# Patient Record
Sex: Male | Born: 1965
Health system: Southern US, Community
[De-identification: ages and names within clinical notes are randomized; demographics above are authoritative.]

## PROBLEM LIST (undated history)

## (undated) DIAGNOSIS — I10 Essential (primary) hypertension: Secondary | ICD-10-CM

## (undated) DIAGNOSIS — B019 Varicella without complication: Secondary | ICD-10-CM

## (undated) DIAGNOSIS — R32 Unspecified urinary incontinence: Secondary | ICD-10-CM

## (undated) DIAGNOSIS — J449 Chronic obstructive pulmonary disease, unspecified: Secondary | ICD-10-CM

## (undated) DIAGNOSIS — F32A Depression, unspecified: Secondary | ICD-10-CM

## (undated) DIAGNOSIS — T7840XA Allergy, unspecified, initial encounter: Secondary | ICD-10-CM

## (undated) DIAGNOSIS — F329 Major depressive disorder, single episode, unspecified: Secondary | ICD-10-CM

## (undated) DIAGNOSIS — R9431 Abnormal electrocardiogram [ECG] [EKG]: Secondary | ICD-10-CM

## (undated) DIAGNOSIS — F419 Anxiety disorder, unspecified: Secondary | ICD-10-CM

## (undated) DIAGNOSIS — U071 COVID-19: Secondary | ICD-10-CM

## (undated) DIAGNOSIS — E785 Hyperlipidemia, unspecified: Secondary | ICD-10-CM

## (undated) DIAGNOSIS — K219 Gastro-esophageal reflux disease without esophagitis: Secondary | ICD-10-CM

## (undated) HISTORY — DX: Allergy, unspecified, initial encounter: T78.40XA

## (undated) HISTORY — DX: Abnormal electrocardiogram (ECG) (EKG): R94.31

## (undated) HISTORY — DX: Gastro-esophageal reflux disease without esophagitis: K21.9

## (undated) HISTORY — DX: Varicella without complication: B01.9

## (undated) HISTORY — DX: Depression, unspecified: F32.A

## (undated) HISTORY — DX: Major depressive disorder, single episode, unspecified: F32.9

## (undated) HISTORY — DX: Hyperlipidemia, unspecified: E78.5

## (undated) HISTORY — DX: Essential (primary) hypertension: I10

## (undated) HISTORY — DX: Unspecified urinary incontinence: R32

## (undated) HISTORY — DX: Anxiety disorder, unspecified: F41.9

---

## 1998-04-18 HISTORY — PX: CHOLECYSTECTOMY: SHX55

## 2006-03-25 ENCOUNTER — Emergency Department: Payer: Self-pay | Admitting: Emergency Medicine

## 2009-10-03 ENCOUNTER — Inpatient Hospital Stay: Payer: Self-pay | Admitting: Internal Medicine

## 2009-10-22 ENCOUNTER — Inpatient Hospital Stay: Payer: Self-pay | Admitting: Specialist

## 2015-02-05 ENCOUNTER — Ambulatory Visit (INDEPENDENT_AMBULATORY_CARE_PROVIDER_SITE_OTHER): Payer: BLUE CROSS/BLUE SHIELD | Admitting: Nurse Practitioner

## 2015-02-05 ENCOUNTER — Encounter: Payer: Self-pay | Admitting: Nurse Practitioner

## 2015-02-05 VITALS — BP 122/82 | HR 100 | Temp 98.5°F | Resp 18 | Ht 69.0 in | Wt 205.6 lb

## 2015-02-05 DIAGNOSIS — Z7689 Persons encountering health services in other specified circumstances: Secondary | ICD-10-CM

## 2015-02-05 DIAGNOSIS — Z23 Encounter for immunization: Secondary | ICD-10-CM

## 2015-02-05 DIAGNOSIS — K219 Gastro-esophageal reflux disease without esophagitis: Secondary | ICD-10-CM

## 2015-02-05 DIAGNOSIS — G629 Polyneuropathy, unspecified: Secondary | ICD-10-CM | POA: Insufficient documentation

## 2015-02-05 DIAGNOSIS — Z7189 Other specified counseling: Secondary | ICD-10-CM | POA: Diagnosis not present

## 2015-02-05 DIAGNOSIS — Z9109 Other allergy status, other than to drugs and biological substances: Secondary | ICD-10-CM

## 2015-02-05 DIAGNOSIS — R34 Anuria and oliguria: Secondary | ICD-10-CM | POA: Insufficient documentation

## 2015-02-05 DIAGNOSIS — Z91048 Other nonmedicinal substance allergy status: Secondary | ICD-10-CM

## 2015-02-05 MED ORDER — GABAPENTIN 300 MG PO CAPS: 300.0000 mg | ORAL_CAPSULE | Freq: Every day | ORAL | Status: DC

## 2015-02-05 MED ORDER — TETANUS-DIPHTH-ACELL PERTUSSIS 5-2.5-18.5 LF-MCG/0.5 IM SUSP: 0.5000 mL | Freq: Once | INTRAMUSCULAR | Status: DC

## 2015-02-05 NOTE — Assessment & Plan Note (Signed)
Patient reports a decrease in urination but would like to talk about this at another time

## 2015-02-05 NOTE — Assessment & Plan Note (Signed)
Discussed acute and chronic issues. Reviewed health maintenance measures, PFSHx, and immunizations. Obtain records from previous facility.   

## 2015-02-05 NOTE — Progress Notes (Signed)
Pre visit review using our clinic review tool, if applicable. No additional management support is needed unless otherwise documented below in the visit note. 

## 2015-02-05 NOTE — Assessment & Plan Note (Signed)
Stable with OTC medications for seasonal allergies

## 2015-02-05 NOTE — Patient Instructions (Signed)
Welcome to [facility]! Nice to meet you.   Take the 1 capsule at night. Follow up in 4 weeks.

## 2015-02-05 NOTE — Assessment & Plan Note (Signed)
Neuropathy of bilateral heels after major back pain with sciatica bilaterally. The sciatica resolved after steroids. The heel pain feels like a toothache to the patient. He still has some sensation but it is decreased. After discussion of treatment options we decided to do gabapentin 300 mg once at nighttime and will follow up in 4 weeks and see if we need to up it.

## 2015-02-05 NOTE — Progress Notes (Signed)
Patient ID: Darrell Santiago, male    DOB: 1965/04/29  Age: 49 y.o. MRN: [id number]  CC: No chief complaint on file.   HPI Darrell Santiago presents for establishing care and CC of feet pain.   1) New pt info:   Immunizations- Unknown tdap status, okay with flu today  Eye Exam- Not UTD  Dental Exam- Not UTD, no flossing  2) Chronic Problems-   GERD- Rolaids, eats spicy foods   Allergies- Environmental, seasonal  Trouble urinating- Feels he needs to urinate badly then goes and it does not seem "worth it" to urinate, no stream concerns he reports, just shorter duration than in the past.   Obese- Diet- Not healthy he reports   Exercise- 12 miles a day at his job   3) Acute Problems-   Foot pain- bilateral heels, feels like a tooth ache Steroids twice helpful at the time  History of back pain radiating went to hospital twice    Flu vaccine and Tdap today   History Darrell Santiago has a past medical history of Chicken pox; GERD (gastroesophageal reflux disease); Allergy; and Urine incontinence.   He has past surgical history that includes Cholecystectomy (2000).   His family history includes Alcohol abuse in his father; Cancer in his maternal grandfather; Diabetes in his father.He reports that he has been smoking Cigarettes.  He has been smoking about 1.00 pack per day. He has never used smokeless tobacco. He reports that he drinks about 12.6 oz of alcohol per week. He reports that he does not use illicit drugs.  No outpatient prescriptions prior to visit.   No facility-administered medications prior to visit.    ROS Review of Systems  Constitutional: Negative for fever, chills, diaphoresis and fatigue.  HENT: Negative for tinnitus and trouble swallowing.   Eyes: Negative for visual disturbance.  Respiratory: Negative for chest tightness, shortness of breath and wheezing.   Cardiovascular: Negative for chest pain, palpitations and leg swelling.  Gastrointestinal: Negative for nausea,  vomiting and diarrhea.  Endocrine: Negative for polydipsia, polyphagia and polyuria.  Genitourinary: Positive for difficulty urinating.  Skin: Negative for rash.  Neurological: Positive for numbness. Negative for dizziness and weakness.  Psychiatric/Behavioral: The patient is not nervous/anxious.     Objective:  BP 122/82 mmHg  Pulse 100  Temp(Src) 98.5 F (36.9 C)  Resp 18  Ht 5\' 9"  (1.753 m)  Wt 205 lb 9.6 oz (93.26 kg)  BMI 30.35 kg/m2  SpO2 95%  Physical Exam  Constitutional: He is oriented to person, place, and time. He appears well-developed and well-nourished. No distress.  HENT:  Head: Normocephalic and atraumatic.  Right Ear: External ear normal.  Left Ear: External ear normal.  Cardiovascular: Normal rate, regular rhythm, normal heart sounds and intact distal pulses.  Exam reveals no gallop and no friction rub.   No murmur heard. Pulmonary/Chest: Effort normal and breath sounds normal. No respiratory distress. He has no wheezes. He has no rales. He exhibits no tenderness.  Neurological: He is alert and oriented to person, place, and time.  Monofilament exam was normal except for decreased sensation of bilateral heels.  Skin: Skin is warm and dry. No rash noted. He is not diaphoretic.  Psychiatric: He has a normal mood and affect. His behavior is normal. Judgment and thought content normal.   Assessment & Plan:   Diagnoses and all orders for this visit:  Encounter for immunization  Need for prophylactic vaccination with combined diphtheria-tetanus-pertussis (DTP) vaccine -  Discontinue: Tdap (BOOSTRIX) injection 0.5 mL; Inject 0.5 mLs into the muscle once. -     Tdap vaccine greater than or equal to 7yo IM  Encounter to establish care  Neuropathy ([place])  Gastroesophageal reflux disease, esophagitis presence not specified  Environmental allergies  Urination decrease  Other orders -     gabapentin (NEURONTIN) 300 MG capsule; Take 1 capsule (300 mg total)  by mouth at bedtime. -     Flu Vaccine QUAD 36+ mos IM   I am having Mr. Baller start on gabapentin.  Meds ordered this encounter  Medications  . gabapentin (NEURONTIN) 300 MG capsule    Sig: Take 1 capsule (300 mg total) by mouth at bedtime.    Dispense:  30 capsule    Refill:  1    Order Specific Question:  Supervising Provider    Answer:  Deborra Medina L [[id number]]  . DISCONTD: Tdap (BOOSTRIX) injection 0.5 mL    Sig:      Follow-up: Return in about 4 weeks (around 03/05/2015) for Neuropathy follow up.

## 2015-02-05 NOTE — Assessment & Plan Note (Signed)
Stable currently with Rolaids. Patient reports he does have a poor diet of fatty, spicy foods, and caffeinated beverages.

## 2015-03-03 ENCOUNTER — Ambulatory Visit (INDEPENDENT_AMBULATORY_CARE_PROVIDER_SITE_OTHER): Payer: BLUE CROSS/BLUE SHIELD | Admitting: Nurse Practitioner

## 2015-03-03 VITALS — BP 142/90 | HR 84 | Temp 98.3°F | Resp 18 | Ht 69.0 in | Wt 202.6 lb

## 2015-03-03 DIAGNOSIS — G629 Polyneuropathy, unspecified: Secondary | ICD-10-CM | POA: Diagnosis not present

## 2015-03-03 MED ORDER — GABAPENTIN 300 MG PO CAPS: 300.0000 mg | ORAL_CAPSULE | Freq: Three times a day (TID) | ORAL | Status: DC

## 2015-03-03 NOTE — Progress Notes (Signed)
Patient ID: Darrell Santiago, male    DOB: 08/16/1965  Age: 49 y.o. MRN: [id number]  CC: Follow-up   HPI Darrell Santiago presents for follow up of Neuropathy.   1) Patient is not improving and taking Gabapentin only 300 mg at night.  Drinking some recently on vacation and reports improvement when drinking alcohol. Pt is not ready to obtain imagining such as Lumbar X-ray and MRI for further evaluation. Pt is not physically active nor is eating a healthy diet.   History Darrell Santiago has a past medical history of Chicken pox; GERD (gastroesophageal reflux disease); Allergy; and Urine incontinence.   He has past surgical history that includes Cholecystectomy (2000).   His family history includes Alcohol abuse in his father; Cancer in his maternal grandfather; Diabetes in his father.He reports that he has been smoking Cigarettes.  He has been smoking about 1.00 pack per day. He has never used smokeless tobacco. He reports that he drinks about 12.6 oz of alcohol per week. He reports that he does not use illicit drugs.  Outpatient Prescriptions Prior to Visit  Medication Sig Dispense Refill  . gabapentin (NEURONTIN) 300 MG capsule Take 1 capsule (300 mg total) by mouth at bedtime. 30 capsule 1   No facility-administered medications prior to visit.    ROS Review of Systems  Constitutional: Negative for fever, chills, diaphoresis and fatigue.  Eyes: Negative for visual disturbance.  Respiratory: Negative for chest tightness, shortness of breath and wheezing.   Cardiovascular: Negative for chest pain, palpitations and leg swelling.  Gastrointestinal: Negative for nausea, vomiting and diarrhea.  Endocrine: Negative for polydipsia, polyphagia and polyuria.  Skin: Negative for rash.  Neurological: Negative for dizziness, weakness and numbness.  Psychiatric/Behavioral: The patient is not nervous/anxious.     Objective:  BP 142/90 mmHg  Pulse 84  Temp(Src) 98.3 F (36.8 C)  Resp 18  Ht 5\' 9"  (1.753  m)  Wt 202 lb 9.6 oz (91.899 kg)  BMI 29.91 kg/m2  SpO2 97%  Physical Exam  Constitutional: He is oriented to person, place, and time. He appears well-developed and well-nourished. No distress.  HENT:  Head: Normocephalic and atraumatic.  Right Ear: External ear normal.  Left Ear: External ear normal.  Cardiovascular: Normal rate, regular rhythm and normal heart sounds.  Exam reveals no gallop and no friction rub.   No murmur heard. Pulmonary/Chest: Effort normal and breath sounds normal. No respiratory distress. He has no wheezes. He has no rales. He exhibits no tenderness.  Musculoskeletal: Normal range of motion. He exhibits no edema or tenderness.  Neurological: He is alert and oriented to person, place, and time. He displays normal reflexes. He exhibits normal muscle tone. Coordination normal.  Negative straight leg lift bilaterally  Skin: Skin is warm and dry. No rash noted. He is not diaphoretic.  Psychiatric: He has a normal mood and affect. His behavior is normal. Judgment and thought content normal.   Assessment & Plan:   Darrell Santiago was seen today for follow-up.  Diagnoses and all orders for this visit:  Neuropathy ([place])  Other orders -     gabapentin (NEURONTIN) 300 MG capsule; Take 1 capsule (300 mg total) by mouth 3 (three) times daily.   I have discontinued [name]'s gabapentin. I am also having him start on gabapentin.  Meds ordered this encounter  Medications  . gabapentin (NEURONTIN) 300 MG capsule    Sig: Take 1 capsule (300 mg total) by mouth 3 (three) times daily.    Dispense:  90 capsule    Refill:  0    Order Specific Question:  Supervising Provider    Answer:  Crecencio Mc [[id number]]     Follow-up: Return in about 4 weeks (around 03/31/2015) for Neuropathy.

## 2015-03-03 NOTE — Patient Instructions (Signed)
Gabapentin 2 capsules at night for 7 days  Then do 3 capsules at night for the rest of the prescription.   Follow up in 4 weeks.

## 2015-03-03 NOTE — Progress Notes (Signed)
Pre visit review using our clinic review tool, if applicable. No additional management support is needed unless otherwise documented below in the visit note. 

## 2015-03-06 ENCOUNTER — Ambulatory Visit: Payer: BLUE CROSS/BLUE SHIELD | Admitting: Nurse Practitioner

## 2015-03-15 ENCOUNTER — Encounter: Payer: Self-pay | Admitting: Nurse Practitioner

## 2015-03-15 NOTE — Assessment & Plan Note (Addendum)
Uncontrolled. Upped to 2 capsules at night and after 7 days can go up to 3 at night for the remainder of the prescription. Advised pt that we may need to repeat imaging of lumbar spine, he declines this today. He was advised to stop smoking, eat a healthy diet, and low back stretches handout was given to pt. FU in 4 weeks.   Advised to watch alcohol intake. Will obtain lab work for liver function and A1c at next visit.

## 2015-03-16 ENCOUNTER — Encounter: Payer: Self-pay | Admitting: Nurse Practitioner

## 2015-03-20 ENCOUNTER — Ambulatory Visit (INDEPENDENT_AMBULATORY_CARE_PROVIDER_SITE_OTHER): Payer: BLUE CROSS/BLUE SHIELD | Admitting: Nurse Practitioner

## 2015-03-20 ENCOUNTER — Encounter: Payer: Self-pay | Admitting: Nurse Practitioner

## 2015-03-20 DIAGNOSIS — G629 Polyneuropathy, unspecified: Secondary | ICD-10-CM

## 2015-03-20 LAB — CBC WITH DIFFERENTIAL/PLATELET
BASOS ABS: 0 10*3/uL (ref 0.0–0.1)
Basophils Relative: 0.5 % (ref 0.0–3.0)
EOS ABS: 0.1 10*3/uL (ref 0.0–0.7)
Eosinophils Relative: 2.4 % (ref 0.0–5.0)
HEMATOCRIT: 49.8 % (ref 39.0–52.0)
Hemoglobin: 17 g/dL (ref 13.0–17.0)
Lymphocytes Relative: 29.6 % (ref 12.0–46.0)
Lymphs Abs: 1.7 10*3/uL (ref 0.7–4.0)
MCHC: 34.1 g/dL (ref 30.0–36.0)
MCV: 108.4 fl — AB (ref 78.0–100.0)
Monocytes Absolute: 0.6 10*3/uL (ref 0.1–1.0)
Monocytes Relative: 10.5 % (ref 3.0–12.0)
Neutro Abs: 3.3 10*3/uL (ref 1.4–7.7)
Neutrophils Relative %: 57 % (ref 43.0–77.0)
Platelets: 176 10*3/uL (ref 150.0–400.0)
RBC: 4.59 Mil/uL (ref 4.22–5.81)
RDW: 14.3 % (ref 11.5–15.5)
WBC: 5.8 10*3/uL (ref 4.0–10.5)

## 2015-03-20 LAB — HEMOGLOBIN A1C: Hgb A1c MFr Bld: 5.3 % (ref 4.6–6.5)

## 2015-03-20 LAB — COMPREHENSIVE METABOLIC PANEL
ALBUMIN: 4.4 g/dL (ref 3.5–5.2)
ALK PHOS: 82 U/L (ref 39–117)
ALT: 36 U/L (ref 0–53)
AST: 54 U/L — AB (ref 0–37)
BUN: 9 mg/dL (ref 6–23)
CO2: 27 mEq/L (ref 19–32)
CREATININE: 0.73 mg/dL (ref 0.40–1.50)
Calcium: 10.1 mg/dL (ref 8.4–10.5)
Chloride: 103 mEq/L (ref 96–112)
GFR: 122.11 mL/min (ref >60.00)
Glucose, Bld: 53 mg/dL — ABNORMAL LOW (ref 70–99)
Potassium: 3.4 mEq/L — ABNORMAL LOW (ref 3.5–5.1)
SODIUM: 144 meq/L (ref 135–145)
TOTAL PROTEIN: 7.1 g/dL (ref 6.0–8.3)
Total Bilirubin: 0.8 mg/dL (ref 0.2–1.2)

## 2015-03-20 MED ORDER — AMITRIPTYLINE HCL 10 MG PO TABS: 10.0000 mg | ORAL_TABLET | Freq: Every day | ORAL | Status: DC

## 2015-03-20 NOTE — Progress Notes (Signed)
Patient ID: Darrell Santiago, male    DOB: March 30, 1966  Age: 49 y.o. MRN: [id number]  CC: Follow-up   HPI Darrell Santiago presents for follow up of neuropathy.   1) Gabapentin upped to 3 x at night. He felt poorly and went back down to 2 capsules at night with no relief and felt poorly still.  3 capsules made him feel forgetful and heavy sleep, tried it 3-4 nights   Went to hospital and reports they did not do imaging in past, but then changed his mind and stated he has had images stating he had a bulging disc.   History Darrell Santiago has a past medical history of Chicken pox; GERD (gastroesophageal reflux disease); Allergy; and Urine incontinence.   He has past surgical history that includes Cholecystectomy (2000).   His family history includes Alcohol abuse in his father; Cancer in his maternal grandfather; Diabetes in his father.He reports that he has been smoking Cigarettes.  He has been smoking about 1.00 pack per day. He has never used smokeless tobacco. He reports that he drinks about 12.6 oz of alcohol per week. He reports that he does not use illicit drugs.  Outpatient Prescriptions Prior to Visit  Medication Sig Dispense Refill  . gabapentin (NEURONTIN) 300 MG capsule Take 1 capsule (300 mg total) by mouth 3 (three) times daily. 90 capsule 0   No facility-administered medications prior to visit.    ROS Review of Systems  Constitutional: Negative for fever, chills, diaphoresis and fatigue.  Eyes: Negative for visual disturbance.  Musculoskeletal: Positive for myalgias and back pain. Negative for joint swelling, arthralgias, gait problem, neck pain and neck stiffness.  Skin: Negative for rash.  Neurological: Positive for numbness. Negative for dizziness, tremors, seizures, syncope, facial asymmetry, speech difficulty, weakness, light-headedness and headaches.  Psychiatric/Behavioral: Negative for suicidal ideas and sleep disturbance. The patient is not nervous/anxious.     Objective:   BP 130/72 mmHg  Pulse 83  Temp(Src) 98 F (36.7 C) (Oral)  Wt 206 lb (93.441 kg)  SpO2 98%  Physical Exam  Constitutional: He is oriented to person, place, and time. He appears well-developed and well-nourished. No distress.  HENT:  Head: Normocephalic and atraumatic.  Right Ear: External ear normal.  Left Ear: External ear normal.  Cardiovascular: Normal rate, regular rhythm and normal heart sounds.  Exam reveals no gallop and no friction rub.   No murmur heard. Pulmonary/Chest: Effort normal and breath sounds normal. No respiratory distress. He has no wheezes. He has no rales. He exhibits no tenderness.  Neurological: He is alert and oriented to person, place, and time. He exhibits normal muscle tone. Coordination normal.  Skin: Skin is warm and dry. No rash noted. He is not diaphoretic.  Psychiatric: He has a normal mood and affect. His behavior is normal. Judgment and thought content normal.   Assessment & Plan:   Darrell Santiago was seen today for follow-up.  Diagnoses and all orders for this visit:  Neuropathy ([place]) -     CBC with Differential/Platelet -     HgB A1c -     Comp Met (CMET) -     Ambulatory referral to Neurology  Other orders -     amitriptyline (ELAVIL) 10 MG tablet; Take 1 tablet (10 mg total) by mouth at bedtime.   I have discontinued [name]'s gabapentin. I am also having him start on amitriptyline.  Meds ordered this encounter  Medications  . amitriptyline (ELAVIL) 10 MG tablet    Sig:  Take 1 tablet (10 mg total) by mouth at bedtime.    Dispense:  30 tablet    Refill:  0    Order Specific Question:  Supervising Provider    Answer:  Crecencio Mc [[id number]]     Follow-up: Return if symptoms worsen or fail to improve.

## 2015-03-20 NOTE — Patient Instructions (Addendum)
Try the Amitriptyline 10 mg one tablet at night. MyChart me in 1-2 weeks and let me know how it is going.   We will contact you about your neurology referral.   Please visit the lab before leaving today

## 2015-03-22 ENCOUNTER — Other Ambulatory Visit: Payer: Self-pay | Admitting: Nurse Practitioner

## 2015-03-22 DIAGNOSIS — R7401 Elevation of levels of liver transaminase levels: Secondary | ICD-10-CM

## 2015-03-22 DIAGNOSIS — R740 Nonspecific elevation of levels of transaminase and lactic acid dehydrogenase [LDH]: Secondary | ICD-10-CM

## 2015-03-22 DIAGNOSIS — E876 Hypokalemia: Secondary | ICD-10-CM

## 2015-03-22 DIAGNOSIS — E162 Hypoglycemia, unspecified: Secondary | ICD-10-CM

## 2015-03-22 NOTE — Assessment & Plan Note (Addendum)
Changing from Gabapentin- not helpful, to amitriptyline at night 10 mg. Will check cbc w/ diff, CMET, and A1c today.  Discussed further work ups. He would like to see Neurology to find out why. I think it is lumbar 5-S1 probable issues causing nerve excitation leading to numbness down legs and into bilateral heels. Declines imaging at this time. Asked him to send me a MyChart if any side effects or question about the med arises. Pt verballized understanding. Will refer to neuro.

## 2015-03-31 ENCOUNTER — Ambulatory Visit: Payer: BLUE CROSS/BLUE SHIELD | Admitting: Nurse Practitioner

## 2015-05-12 ENCOUNTER — Encounter: Payer: Self-pay | Admitting: Nurse Practitioner

## 2016-01-21 ENCOUNTER — Emergency Department: Payer: No Typology Code available for payment source

## 2016-01-21 ENCOUNTER — Encounter: Payer: Self-pay | Admitting: Emergency Medicine

## 2016-01-21 ENCOUNTER — Emergency Department
Admission: EM | Admit: 2016-01-21 | Discharge: 2016-01-21 | Disposition: A | Discharge status: Home / Self Care | Payer: No Typology Code available for payment source | Attending: Emergency Medicine | Admitting: Emergency Medicine

## 2016-01-21 DIAGNOSIS — Y92410 Unspecified street and highway as the place of occurrence of the external cause: Secondary | ICD-10-CM | POA: Insufficient documentation

## 2016-01-21 DIAGNOSIS — F17210 Nicotine dependence, cigarettes, uncomplicated: Secondary | ICD-10-CM | POA: Insufficient documentation

## 2016-01-21 DIAGNOSIS — S39012A Strain of muscle, fascia and tendon of lower back, initial encounter: Secondary | ICD-10-CM | POA: Diagnosis not present

## 2016-01-21 DIAGNOSIS — S161XXA Strain of muscle, fascia and tendon at neck level, initial encounter: Secondary | ICD-10-CM | POA: Diagnosis not present

## 2016-01-21 DIAGNOSIS — Y999 Unspecified external cause status: Secondary | ICD-10-CM | POA: Insufficient documentation

## 2016-01-21 DIAGNOSIS — Y9389 Activity, other specified: Secondary | ICD-10-CM | POA: Diagnosis not present

## 2016-01-21 DIAGNOSIS — S3992XA Unspecified injury of lower back, initial encounter: Secondary | ICD-10-CM | POA: Diagnosis present

## 2016-01-21 MED ORDER — CYCLOBENZAPRINE HCL 10 MG PO TABS
5.0000 mg | ORAL_TABLET | Freq: Once | ORAL | Status: AC
  Administered 2016-01-21: 5 mg via ORAL
  Filled 2016-01-21: qty 1

## 2016-01-21 MED ORDER — CYCLOBENZAPRINE HCL 10 MG PO TABS: 10.0000 mg | ORAL_TABLET | Freq: Three times a day (TID) | ORAL | 0 refills | Status: DC | PRN

## 2016-01-21 MED ORDER — IBUPROFEN 800 MG PO TABS: 800.0000 mg | ORAL_TABLET | Freq: Three times a day (TID) | ORAL | 0 refills | Status: DC | PRN

## 2016-01-21 MED ORDER — OXYCODONE-ACETAMINOPHEN 5-325 MG PO TABS
2.0000 | ORAL_TABLET | Freq: Once | ORAL | Status: AC
  Administered 2016-01-21: 2 via ORAL
  Filled 2016-01-21: qty 2
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
## 2016-08-12 ENCOUNTER — Ambulatory Visit (INDEPENDENT_AMBULATORY_CARE_PROVIDER_SITE_OTHER): Payer: BLUE CROSS/BLUE SHIELD | Admitting: Family Medicine

## 2016-08-12 ENCOUNTER — Encounter: Payer: Self-pay | Admitting: Family Medicine

## 2016-08-12 DIAGNOSIS — R229 Localized swelling, mass and lump, unspecified: Secondary | ICD-10-CM | POA: Diagnosis not present

## 2016-08-12 NOTE — Patient Instructions (Signed)
Call me and let me know.  Its benign, likely a lipoma.  Take care  Dr. Lacinda Axon

## 2016-08-12 NOTE — Progress Notes (Signed)
   Subjective:  Patient ID: Darrell Santiago, male    DOB: [birth year]  Age: 51 y.o. MRN: [id number]  CC: Bump - chin.   HPI:  51 year old male presents with the above complaint.  Patient reports that he's had a bump/mass underneath his chin/upper neck for the past 2 years. He recently cut off an extensive portion of his beard. The bump/mass seems to be larger than previous. It is not painful. No redness. No systemic signs or symptoms. He feels otherwise well. His wife is quite concerned and wants this examined. No other complaints or concerns at this time.  Social Hx   Social History   Social History  . Marital status: Married    Spouse name: N/A  . Number of children: N/A  . Years of education: N/A   Social History Main Topics  . Smoking status: Current Every Day Smoker    Packs/day: 1.00    Types: Cigarettes  . Smokeless tobacco: Never Used  . Alcohol use 12.6 oz/week    21 Shots of liquor per week     Comment: 3-4 a night   . Drug use: No  . Sexual activity: Yes    Partners: Female   Other Topics Concern  . None   Social History Narrative   Married   Employed at [employer] education   Caffeine- Sodas 2-3 a day, tea occasionally, no coffee           Review of Systems  Constitutional: Negative.   Skin:       Mass/lump.     Objective:  BP (!) 150/104   Pulse 81   Temp 98 F (36.7 C) (Oral)   Wt 209 lb (94.8 kg)   SpO2 95%   BMI 30.86 kg/m   BP/Weight 08/12/2016 01/21/2016 50/08/6977  Systolic BP 480 165 537  Diastolic BP 482 85 72  Wt. (Lbs) 209 202 206  BMI 30.86 29.83 30.41    Physical Exam  Constitutional: He is oriented to person, place, and time. He appears well-developed. No distress.  Pulmonary/Chest: Effort normal.  Neurological: He is alert and oriented to person, place, and time.  Skin:  Large circular palpable mass located underneath the mandible/in the anterior neck (midline). Easily movable. No surrounding erythema.  Nontender to palpation.  Psychiatric: He has a normal mood and affect.  Vitals reviewed.   Lab Results  Component Value Date   WBC 5.8 03/20/2015   HGB 17.0 03/20/2015   HCT 49.8 03/20/2015   PLT 176.0 03/20/2015   GLUCOSE 53 (L) 03/20/2015   ALT 36 03/20/2015   AST 54 (H) 03/20/2015   NA 144 03/20/2015   K 3.4 (L) 03/20/2015   CL 103 03/20/2015   CREATININE 0.73 03/20/2015   BUN 9 03/20/2015   CO2 27 03/20/2015   HGBA1C 5.3 03/20/2015    Assessment & Plan:   Problem List Items Addressed This Visit    Skin mass    New problem. Clinically appears to be a lipoma. Patient states that he would like excision later this year. It does not bothering him currently. No need for acute intervention. Watchful waiting.        Follow-up: PRN  [place]

## 2016-08-12 NOTE — Assessment & Plan Note (Signed)
New problem. Clinically appears to be a lipoma. Patient states that he would like excision later this year. It does not bothering him currently. No need for acute intervention. Watchful waiting.

## 2016-08-12 NOTE — Progress Notes (Signed)
Pre visit review using our clinic review tool, if applicable. No additional management support is needed unless otherwise documented below in the visit note. 

## 2017-01-24 ENCOUNTER — Encounter: Payer: Self-pay | Admitting: Emergency Medicine

## 2017-01-24 ENCOUNTER — Telehealth: Payer: Self-pay | Admitting: Family Medicine

## 2017-01-24 ENCOUNTER — Emergency Department
Admission: EM | Admit: 2017-01-24 | Discharge: 2017-01-24 | Disposition: A | Discharge status: Home / Self Care | Payer: BLUE CROSS/BLUE SHIELD | Attending: Emergency Medicine | Admitting: Emergency Medicine

## 2017-01-24 ENCOUNTER — Emergency Department: Payer: BLUE CROSS/BLUE SHIELD

## 2017-01-24 DIAGNOSIS — F17210 Nicotine dependence, cigarettes, uncomplicated: Secondary | ICD-10-CM | POA: Insufficient documentation

## 2017-01-24 DIAGNOSIS — R0789 Other chest pain: Secondary | ICD-10-CM | POA: Diagnosis not present

## 2017-01-24 DIAGNOSIS — R0602 Shortness of breath: Secondary | ICD-10-CM | POA: Diagnosis not present

## 2017-01-24 DIAGNOSIS — Z9049 Acquired absence of other specified parts of digestive tract: Secondary | ICD-10-CM | POA: Diagnosis not present

## 2017-01-24 DIAGNOSIS — R079 Chest pain, unspecified: Secondary | ICD-10-CM | POA: Diagnosis not present

## 2017-01-24 LAB — LIPASE, BLOOD: Lipase: 20 U/L (ref 11–51)

## 2017-01-24 LAB — BASIC METABOLIC PANEL
ANION GAP: 14 (ref 5–15)
BUN: 8 mg/dL (ref 6–20)
CHLORIDE: 101 mmol/L (ref 101–111)
CO2: 27 mmol/L (ref 22–32)
Calcium: 9.6 mg/dL (ref 8.9–10.3)
Creatinine, Ser: 0.65 mg/dL (ref 0.61–1.24)
GFR calc non Af Amer: >60 mL/min (ref >60)
GLUCOSE: 106 mg/dL — AB (ref 65–99)
POTASSIUM: 3.2 mmol/L — AB (ref 3.5–5.1)
Sodium: 142 mmol/L (ref 135–145)

## 2017-01-24 LAB — HEPATIC FUNCTION PANEL
ALK PHOS: 72 U/L (ref 38–126)
ALT: 36 U/L (ref 17–63)
AST: 55 U/L — AB (ref 15–41)
Albumin: 4.4 g/dL (ref 3.5–5.0)
BILIRUBIN DIRECT: 0.3 mg/dL (ref 0.1–0.5)
BILIRUBIN INDIRECT: 1 mg/dL — AB (ref 0.3–0.9)
BILIRUBIN TOTAL: 1.3 mg/dL — AB (ref 0.3–1.2)
Total Protein: 7.3 g/dL (ref 6.5–8.1)

## 2017-01-24 LAB — CBC
HEMATOCRIT: 47.6 % (ref 40.0–52.0)
HEMOGLOBIN: 16.8 g/dL (ref 13.0–18.0)
MCH: 37.6 pg — AB (ref 26.0–34.0)
MCHC: 35.3 g/dL (ref 32.0–36.0)
MCV: 106.3 fL — AB (ref 80.0–100.0)
Platelets: 163 10*3/uL (ref 150–440)
RBC: 4.48 MIL/uL (ref 4.40–5.90)
RDW: 13.7 % (ref 11.5–14.5)
WBC: 5.9 10*3/uL (ref 3.8–10.6)

## 2017-01-24 LAB — TROPONIN I

## 2017-01-24 MED ORDER — ASPIRIN 81 MG PO CHEW
324.0000 mg | CHEWABLE_TABLET | Freq: Once | ORAL | Status: AC
Start: 2017-01-24 — End: 2017-01-24
  Administered 2017-01-24: 324 mg via ORAL
  Filled 2017-01-24: qty 4

## 2017-01-24 NOTE — Telephone Encounter (Signed)
Pt spouse called stating that pt breaks out in a sweat, gets chest pain, and cannot breath. This happens at various times of the day. It happened about 4 times yesterday and last night. Sent call to team health triage.  Call pt @ [phone]

## 2017-01-24 NOTE — ED Provider Notes (Addendum)
[facility] Emergency Department Provider Note  ____________________________________________   I have reviewed the triage vital signs and the nursing notes.   HISTORY  Chief Complaint Chest Pain    HPI Darrell Santiago is a 51 y.o. male there is a smoker, no significant family history of CAD, or blood clots,/DVT presents today complaining of left-sided very atypically described "suction feeling" in the left chest which happens off and on for the last several weeks. He is under a lot of stress he states. No history of panic attacks and he can remember. The pain or "discomfort" rather happens at rest or while exerting himself irrespective of what he is doing. It is not related to exertion per se. He can exert herself with no difficulty. Usually lasts for about 30 seconds. Yesterday, at about 4 PM, he had one that lasted for a few minutes and that made him concerned. He has not had diaphoresis, there is no radiation of the pain, its in the left and substernal chest. He has not had any pain since yesterday. He states he walks with no difficulty and does exertional behaviors at work where he has no symptoms. He has not had to curtail his activities in any sense. The pain is not associated with food. He's had normal bowel movements and no nausea no vomiting. He's had no melena bright red blood per rectum or hematemesis. His father has cancer and he works 2 jobs and he does feel stressed. He does smoke cigarettes. He states that the pain goes away on its own nothing makes it better nothing makes it worse. No antecedent interventions.  Past Medical History:  Diagnosis Date  . Allergy    Seasonal  . Chicken pox   . GERD (gastroesophageal reflux disease)   . Urine incontinence     Patient Active Problem List   Diagnosis Date Noted  . Skin mass 08/12/2016  . Neuropathy 02/05/2015  . GERD (gastroesophageal reflux disease) 02/05/2015  . Environmental allergies 02/05/2015     Past Surgical History:  Procedure Laterality Date  . CHOLECYSTECTOMY  2000    Prior to Admission medications   Not on File    Allergies Patient has no known allergies.  Family History  Problem Relation Age of Onset  . Alcohol abuse Father   . Diabetes Father   . Cancer Maternal Grandfather        Colon Cancer    Social History Social History  Substance Use Topics  . Smoking status: Current Every Day Smoker    Packs/day: 1.00    Types: Cigarettes  . Smokeless tobacco: Never Used  . Alcohol use 12.6 oz/week    21 Shots of liquor per week     Comment: 3-4 a night     Review of Systems Constitutional: No fever/chills Eyes: No visual changes. ENT: No sore throat. No stiff neck no neck pain Cardiovascular: Denies chest pain at this time, see history of present illness, no pleuritic chest pain Respiratory: Denies shortness of breath. Gastrointestinal:   no vomiting.  No diarrhea.  No constipation. Genitourinary: Negative for dysuria. Musculoskeletal: Negative lower extremity swelling Skin: Negative for rash. Neurological: Negative for severe headaches, focal weakness or numbness.   ____________________________________________   PHYSICAL EXAM:  VITAL SIGNS: ED Triage Vitals  Enc Vitals Group     BP 01/24/17 0926 133/90     Pulse Rate 01/24/17 0926 86     Resp 01/24/17 0926 20     Temp 01/24/17 0926 98.1  F (36.7 C)     Temp Source 01/24/17 0926 Oral     SpO2 01/24/17 0926 94 %     Weight 01/24/17 0929 200 lb (90.7 kg)     Height 01/24/17 0929 5\' 9"  (1.753 m)     Head Circumference --      Peak Flow --      Pain Score --      Pain Loc --      Pain Edu? --      Excl. in [place]? --     Constitutional: Alert and oriented. Well appearing and in no acute distress. Eyes: Conjunctivae are normal Head: Atraumatic HEENT: No congestion/rhinnorhea. Mucous membranes are moist.  Oropharynx non-erythematous Neck:   Nontender with no meningismus, no masses, no  stridor Cardiovascular: Normal rate, regular rhythm. Grossly normal heart sounds.  Good peripheral circulation. Respiratory: Normal respiratory effort.  No retractions. Lungs CTAB. Abdominal: Soft and slight epigastric tenderness. No distention. No guarding no rebound Back:  There is no focal tenderness or step off.  there is no midline tenderness there are no lesions noted. there is no CVA tenderness Musculoskeletal: No lower extremity tenderness, no upper extremity tenderness. No joint effusions, no DVT signs strong distal pulses no edema Neurologic:  Normal speech and language. No gross focal neurologic deficits are appreciated.  Skin:  Skin is warm, dry and intact. No rash noted. Psychiatric: Mood and affect are normal. Speech and behavior are normal.  ____________________________________________   LABS (all labs ordered are listed, but only abnormal results are displayed)  Labs Reviewed  BASIC METABOLIC PANEL - Abnormal; Notable for the following:       Result Value   Potassium 3.2 (*)    Glucose, Bld 106 (*)    All other components within normal limits  CBC - Abnormal; Notable for the following:    MCV 106.3 (*)    MCH 37.6 (*)    All other components within normal limits  TROPONIN I  HEPATIC FUNCTION PANEL  LIPASE, BLOOD    Pertinent labs  results that were available during my care of the patient were reviewed by me and considered in my medical decision making (see chart for details). ____________________________________________  EKG  I personally interpreted any EKGs ordered by me or triage patient has a known right bundle branch block from 2011, that is still present, rate 84 bpm no acute ischemic changes noted ____________________________________________  RADIOLOGY  Pertinent labs & imaging results that were available during my care of the patient were reviewed by me and considered in my medical decision making (see chart for details). If possible, patient and/or  family made aware of any abnormal findings. ____________________________________________    PROCEDURES  Procedure(s) performed: None  Procedures  Critical Care performed: None  ____________________________________________   INITIAL IMPRESSION / ASSESSMENT AND PLAN / ED COURSE  Pertinent labs & imaging results that were available during my care of the patient were reviewed by me and considered in my medical decision making (see chart for details).  she was very atypically described pain that happens at rest or while moving around for the last 2 weeks in the context of increased stress. He does have some risk factors for ACS and some parts he has tobacco abuse and his EKG is at baseline not completely normal however, patient has no pain or symptoms today nor has he had any for nearly 18 hours, his initial troponin is negative, his abdomen is benign. Buccal different things could possibly cause  this. Very low suspicion for PE given 30 seconds of nonpleuritic pain off and on for 2 weeks. Very low suspicion for ACS frankly given this description and the lack of exertional symptoms but given his risk factors including ongoing tobacco abuse, cardiac enzymes were obtained and are negative. Chest x-ray is reassuring that is no evidence of pneumonia or pneumothorax, very low suspicion for PE, patient does not have real risk factors for that and the story begins for atypical for it. We'll send liver function test and lipase this patient does drink alcohol to see if this can be referred abdominal pain with this time he does not have any significant abdominal pain. He has minimal epigastric discomfort however which comes and goes.nothing to suggest AAA however. At this time, there does not appear to be clinical evidence to support the diagnosis of pulmonary embolus, dissection, myocarditis, endocarditis, pericarditis, pericardial tamponade, acute coronary syndrome, pneumothorax, pneumonia, or any other acute  intrathoracic pathology that will require admission or acute intervention. Nor is there evidence of any significant intra-abdominal pathology causing this discomfort. Considering the patient's symptoms, medical history, and physical examination today, I have low suspicion for cholecystitis or biliary pathology, pancreatitis, perforation or bowel obstruction, hernia, intra-abdominal abscess, AAA or dissection, volvulus or intussusception, mesenteric ischemia, ischemic gut, pyelonephritis or appendicitis.  Clinical Course as of Jan 25 1044  Tue Jan 24, 2017  1043 Dw/ dr. Dan Europe, who understands patient's history, physical, and all of his other findings. He is very reassured with discharge and will see the patient right now in his office. We'll discharge the patient therefore to direct follow-up with cardiology. I do not think that serial enzymes or of utility in a patient has not had pain in 18 hours. Patient very comfortable with this plan and will follow directly with cardiology.  [JM]    Clinical Course User Index [JM] Schuyler Amor, MD   ____________________________________________   FINAL CLINICAL IMPRESSION(S) / ED DIAGNOSES  Final diagnoses:  None      This chart was dictated using voice recognition software.  Despite best efforts to proofread,  errors can occur which can change meaning.      Schuyler Amor, MD 01/24/17 1021    Schuyler Amor, MD 01/24/17 1045

## 2017-01-24 NOTE — Discharge Instructions (Signed)
go directly to cardiology from this office, they are right around the corner. Return to the emergency knee or worrisome symptoms including increased pain, shortness of breath, vomiting etc.

## 2017-01-24 NOTE — Telephone Encounter (Signed)
Per chart, patient is in the ED at this time.

## 2017-01-24 NOTE — Telephone Encounter (Signed)
Patient Name: IZAK ANDING  DOB: Feb 11, 1966    Initial Comment Caller states, breaks out in sweats, gets chest pains and tightness, and sometiems can't breath -- appt line sent him to triage --    Nurse Assessment  Nurse: Raphael Gibney, RN, Vanita Ingles Date/Time (Eastern Time): 01/24/2017 8:15:47 AM  Confirm and document reason for call. If symptomatic, describe symptoms. ---Caller states spouse is at home. Had chest pain last night which is intermittent She is at work. Pain is on the left side of his chest. Has had chest pain off and on for 2 weeks. Had chest pain x 4 yesterday and at 6 am had severe chest pain. States she is not with him and he won't answer the phone.  Does the patient have any new or worsening symptoms? ---Yes  Will a triage be completed? ---No  Select reason for no triage. ---Other  Please document clinical information provided and list any resource used. ---Advised caller as per nursing knowledge that pt needs to go to the ER if he has had 4 episodes of chest pain last night and severe chest pain this am. States she will take him to [place] Notes       Final Disposition User   Clinical Call [place], Therapist, sports, Vanita Ingles

## 2017-01-24 NOTE — ED Triage Notes (Signed)
Pt reports CP that started 2 weeks ago intermittently. Pt reports when pain happens it feels like a vauum in his chest and he can not breathe. Pt reports some nausea with the pain. Pt reports has been under a lot of stress, takes no medicine and hasn't been to the MD in awhile.

## 2017-01-25 DIAGNOSIS — R079 Chest pain, unspecified: Secondary | ICD-10-CM | POA: Diagnosis not present

## 2017-01-29 ENCOUNTER — Emergency Department: Payer: BLUE CROSS/BLUE SHIELD

## 2017-01-29 ENCOUNTER — Encounter: Payer: Self-pay | Admitting: Radiology

## 2017-01-29 ENCOUNTER — Emergency Department
Admission: EM | Admit: 2017-01-29 | Discharge: 2017-01-29 | Disposition: A | Discharge status: Home / Self Care | Payer: BLUE CROSS/BLUE SHIELD | Attending: Emergency Medicine | Admitting: Emergency Medicine

## 2017-01-29 ENCOUNTER — Other Ambulatory Visit: Payer: Self-pay

## 2017-01-29 DIAGNOSIS — F10920 Alcohol use, unspecified with intoxication, uncomplicated: Secondary | ICD-10-CM

## 2017-01-29 DIAGNOSIS — K219 Gastro-esophageal reflux disease without esophagitis: Secondary | ICD-10-CM | POA: Diagnosis not present

## 2017-01-29 DIAGNOSIS — F10220 Alcohol dependence with intoxication, uncomplicated: Secondary | ICD-10-CM | POA: Diagnosis not present

## 2017-01-29 DIAGNOSIS — Y907 Blood alcohol level of 200-239 mg/100 ml: Secondary | ICD-10-CM | POA: Diagnosis not present

## 2017-01-29 DIAGNOSIS — F10120 Alcohol abuse with intoxication, uncomplicated: Secondary | ICD-10-CM | POA: Diagnosis not present

## 2017-01-29 DIAGNOSIS — R079 Chest pain, unspecified: Secondary | ICD-10-CM | POA: Insufficient documentation

## 2017-01-29 DIAGNOSIS — F17210 Nicotine dependence, cigarettes, uncomplicated: Secondary | ICD-10-CM | POA: Diagnosis not present

## 2017-01-29 DIAGNOSIS — R071 Chest pain on breathing: Secondary | ICD-10-CM | POA: Diagnosis not present

## 2017-01-29 LAB — CBC
HEMATOCRIT: 49.4 % (ref 40.0–52.0)
Hemoglobin: 16.7 g/dL (ref 13.0–18.0)
MCH: 36.5 pg — ABNORMAL HIGH (ref 26.0–34.0)
MCHC: 33.8 g/dL (ref 32.0–36.0)
MCV: 108.2 fL — ABNORMAL HIGH (ref 80.0–100.0)
PLATELETS: 170 10*3/uL (ref 150–440)
RBC: 4.57 MIL/uL (ref 4.40–5.90)
RDW: 14.3 % (ref 11.5–14.5)
WBC: 6.3 10*3/uL (ref 3.8–10.6)

## 2017-01-29 LAB — BASIC METABOLIC PANEL
Anion gap: 13 (ref 5–15)
BUN: 10 mg/dL (ref 6–20)
CALCIUM: 9.1 mg/dL (ref 8.9–10.3)
CO2: 29 mmol/L (ref 22–32)
Chloride: 102 mmol/L (ref 101–111)
Creatinine, Ser: 0.74 mg/dL (ref 0.61–1.24)
GFR calc Af Amer: >60 mL/min (ref >60)
GLUCOSE: 115 mg/dL — AB (ref 65–99)
POTASSIUM: 3.1 mmol/L — AB (ref 3.5–5.1)
SODIUM: 144 mmol/L (ref 135–145)

## 2017-01-29 LAB — TROPONIN I
Troponin I: <0.03 ng/mL (ref <0.03)
Troponin I: <0.03 ng/mL (ref <0.03)

## 2017-01-29 LAB — ETHANOL: ALCOHOL ETHYL (B): 228 mg/dL — AB (ref <10)

## 2017-01-29 MED ORDER — KETOROLAC TROMETHAMINE 30 MG/ML IJ SOLN
30.0000 mg | Freq: Once | INTRAMUSCULAR | Status: AC
  Administered 2017-01-29: 30 mg via INTRAVENOUS
  Filled 2017-01-29: qty 1

## 2017-01-29 MED ORDER — POTASSIUM CHLORIDE CRYS ER 20 MEQ PO TBCR
40.0000 meq | EXTENDED_RELEASE_TABLET | Freq: Once | ORAL | Status: AC
  Administered 2017-01-29: 40 meq via ORAL
  Filled 2017-01-29: qty 2

## 2017-01-29 MED ORDER — SUCRALFATE 1 GM/10ML PO SUSP: 1.0000 g | Freq: Four times a day (QID) | ORAL | 1 refills | Status: DC

## 2017-01-29 MED ORDER — ONDANSETRON HCL 4 MG/2ML IJ SOLN
INTRAMUSCULAR | Status: AC
  Filled 2017-01-29: qty 2

## 2017-01-29 MED ORDER — FAMOTIDINE IN NACL 20-0.9 MG/50ML-% IV SOLN
20.0000 mg | Freq: Once | INTRAVENOUS | Status: AC
  Administered 2017-01-29: 20 mg via INTRAVENOUS
  Filled 2017-01-29: qty 50

## 2017-01-29 MED ORDER — PANTOPRAZOLE SODIUM 40 MG PO TBEC: 40.0000 mg | DELAYED_RELEASE_TABLET | Freq: Every day | ORAL | 0 refills | Status: DC

## 2017-01-29 MED ORDER — MORPHINE SULFATE (PF) 4 MG/ML IV SOLN
INTRAVENOUS | Status: AC
  Filled 2017-01-29: qty 1

## 2017-01-29 MED ORDER — IOPAMIDOL (ISOVUE-370) INJECTION 76%
75.0000 mL | Freq: Once | INTRAVENOUS | Status: AC | PRN
  Administered 2017-01-29: 75 mL via INTRAVENOUS

## 2017-01-29 MED ORDER — MORPHINE SULFATE (PF) 4 MG/ML IV SOLN
4.0000 mg | Freq: Once | INTRAVENOUS | Status: AC
  Administered 2017-01-29: 4 mg via INTRAVENOUS

## 2017-01-29 MED ORDER — ONDANSETRON HCL 4 MG/2ML IJ SOLN
4.0000 mg | Freq: Once | INTRAMUSCULAR | Status: AC
  Administered 2017-01-29: 4 mg via INTRAVENOUS

## 2017-01-29 NOTE — ED Provider Notes (Signed)
[facility] Emergency Department Provider Note   ____________________________________________   First MD Initiated Contact with Patient 01/29/17 0122     (approximate)  I have reviewed the triage vital signs and the nursing notes.   HISTORY  Chief Complaint Chest Pain    HPI Darrell Santiago is a 51 y.o. male who presents to the ED from home with a chief complaint of chest pain.Patient has a history of GERD, smoker, alcohol use who was seen in the ED last week for same. The same day he was seen by Dr. Chancy Milroy from cardiology and had a stress test in the office. Patient does not yet know the results of that stress test but has an upcoming appointment next week to find out.states he was awakened tonight with sharp left-sided chest pain which she describes as nonradiating. Pain is worsened with deep breathing and coughing. Denies associated fever, chills, shortness of breath, abdominal pain, nausea, vomiting, diaphoresis. Denies recent travel or trauma.   Past Medical History:  Diagnosis Date  . Allergy    Seasonal  . Chicken pox   . GERD (gastroesophageal reflux disease)   . Urine incontinence     Patient Active Problem List   Diagnosis Date Noted  . Skin mass 08/12/2016  . Neuropathy 02/05/2015  . GERD (gastroesophageal reflux disease) 02/05/2015  . Environmental allergies 02/05/2015    Past Surgical History:  Procedure Laterality Date  . CHOLECYSTECTOMY  2000    Prior to Admission medications   Not on File    Allergies Patient has no known allergies.  Family History  Problem Relation Age of Onset  . Alcohol abuse Father   . Diabetes Father   . Cancer Maternal Grandfather        Colon Cancer    Social History Social History  Substance Use Topics  . Smoking status: Current Every Day Smoker    Packs/day: 1.00    Types: Cigarettes  . Smokeless tobacco: Never Used  . Alcohol use 12.6 oz/week    21 Shots of liquor per week   Comment: 3-4 a night     Review of Systems  Constitutional: No fever/chills. Eyes: No visual changes. ENT: No sore throat. Cardiovascular: positive for chest pain. Respiratory: positive for nonproductive cough. Denies shortness of breath. Gastrointestinal: No abdominal pain.  No nausea, no vomiting.  No diarrhea.  No constipation. Genitourinary: Negative for dysuria. Musculoskeletal: Negative for back pain. Skin: Negative for rash. Neurological: Negative for headaches, focal weakness or numbness.   ____________________________________________   PHYSICAL EXAM:  VITAL SIGNS: ED Triage Vitals [01/29/17 0057]  Enc Vitals Group     BP      Pulse      Resp      Temp      Temp src      SpO2      Weight      Height      Head Circumference      Peak Flow      Pain Score 7     Pain Loc      Pain Edu?      Excl. in [place]?     Constitutional: Alert and oriented. Well appearing and in mild acute distress. Eyes: Conjunctivae are normal. PERRL. EOMI. Head: Atraumatic. Nose: No congestion/rhinnorhea. Mouth/Throat: Mucous membranes are moist.  Oropharynx non-erythematous. Neck: No stridor.  No carotid bruits. Cardiovascular: Normal rate, regular rhythm. Grossly normal heart sounds.  Good peripheral circulation. Respiratory: Normal respiratory effort.  No  retractions. Lungs CTAB. Splinting secondary to pain. Left anterior chest tender to palpation. Gastrointestinal: Soft and nontender. No distention. No abdominal bruits. No CVA tenderness. Musculoskeletal: No lower extremity tenderness nor edema.  No joint effusions. Neurologic:  Normal speech and language. No gross focal neurologic deficits are appreciated.  Skin:  Skin is warm, dry and intact. No rash noted. Psychiatric: Mood and affect are normal. Speech and behavior are normal.  ____________________________________________   LABS (all labs ordered are listed, but only abnormal results are displayed)  Labs Reviewed  BASIC  METABOLIC PANEL - Abnormal; Notable for the following:       Result Value   Potassium 3.1 (*)    Glucose, Bld 115 (*)    All other components within normal limits  CBC - Abnormal; Notable for the following:    MCV 108.2 (*)    MCH 36.5 (*)    All other components within normal limits  ETHANOL - Abnormal; Notable for the following:    Alcohol, Ethyl (B) 228 (*)    All other components within normal limits  TROPONIN I  TROPONIN I   ____________________________________________  EKG  ED ECG REPORT I, Naydene Kamrowski J, the attending physician, personally viewed and interpreted this ECG.   Date: 01/29/2017  EKG Time: 0057  Rate: 109  Rhythm: sinus tachycardia  Axis: LAD  Intervals:right bundle branch block  ST&T Change: nonspecific  ____________________________________________  RADIOLOGY  Dg Chest 2 View  Result Date: 01/29/2017 CLINICAL DATA:  Awakened with chest pain tonight, pleuritic. Recent hospital admission for the same issues. EXAM: CHEST  2 VIEW COMPARISON:  01/24/2017 FINDINGS: The lungs are clear. The pulmonary vasculature is normal. Heart size is normal. Hilar and mediastinal contours are unremarkable. There is no pleural effusion. IMPRESSION: No active cardiopulmonary disease. Electronically Signed   By: Andreas Newport M.D.   On: 01/29/2017 01:45   Ct Angio Chest Pe W/cm &/or Wo Cm  Result Date: 01/29/2017 CLINICAL DATA:  Acute onset of generalized chest pain and cough. Initial encounter. EXAM: CT ANGIOGRAPHY CHEST WITH CONTRAST TECHNIQUE: Multidetector CT imaging of the chest was performed using the standard protocol during bolus administration of intravenous contrast. Multiplanar CT image reconstructions and MIPs were obtained to evaluate the vascular anatomy. CONTRAST:  75 mL of Isovue 370 IV contrast COMPARISON:  Chest radiograph performed earlier today at 1:33 a.m. FINDINGS: Cardiovascular:  There is no evidence of pulmonary embolus. The heart is normal in size.  The thoracic aorta is unremarkable. The great vessels are grossly unremarkable in appearance. Mediastinum/Nodes: The mediastinum is unremarkable in appearance. No mediastinal lymphadenopathy is seen. No pericardial effusion is identified. The thyroid gland is unremarkable. No axillary lymphadenopathy is appreciated. Lungs/Pleura: A bleb is noted at the right lung base. The lungs are otherwise clear. There is no evidence of focal consolidation, pleural effusion or pneumothorax. No masses are seen. Upper Abdomen: The visualized portions of the liver and spleen are unremarkable. The patient is status post cholecystectomy, with clips at the gallbladder fossa. The visualized portions of the pancreas and adrenal glands are within normal limits. Nonspecific perinephric stranding is noted bilaterally. Musculoskeletal: No acute osseous abnormalities are identified. The visualized musculature is unremarkable in appearance. Review of the MIP images confirms the above findings. IMPRESSION: 1. No evidence of pulmonary embolus. 2. Bleb at the right lung base.  Lungs otherwise clear. Electronically Signed   By: Garald Balding M.D.   On: 01/29/2017 03:21    ____________________________________________   PROCEDURES  Procedure(s) performed: None  Procedures  Critical Care performed: No  ____________________________________________   INITIAL IMPRESSION / ASSESSMENT AND PLAN / ED COURSE  As part of my medical decision making, I reviewed the following data within the [place] History obtained from family, Nursing notes reviewed and incorporated, Labs reviewed, EKG interpreted, Radiograph reviewed and Notes from prior ED visits.   51 year old male who returns to the ED in a week for atypical sharp, pleuritic chest pain. Differential diagnosis includes, but is not limited to, ACS, aortic dissection, pulmonary embolism, cardiac tamponade, pneumothorax, pneumonia, pericarditis/myocarditis,  GI-related causes including esophagitis/gastritis, and musculoskeletal chest wall pain.  initial EKG and troponin are unremarkable. Will repeat a troponin, replete potassium, administer analgesics and proceed to CT chest to evaluate for pulmonary embolism.  ----------------------------------------- 2:23 AM on 01/29/2017 -----------------------------------------   OBSERVATION CARE: This patient is being placed under observation care for the following reasons: Chest pain with repeat testing to rule out ischemia   Clinical Course as of Jan 29 429  Sun Jan 29, 2017  0223 Initial dose of IV morphine took the edge off of patient's sharp pain. Will add Toradol.  [JS]  7782 Noted patient's elevated EtOH level. Updated patient and spouse of CT results. Awaiting repeat troponin.  [JS]    Clinical Course User Index [JS] Paulette Blanch, MD    ----------------------------------------- 4:26 AM on 01/29/2017 -----------------------------------------   END OF OBSERVATION STATUS: After an appropriate period of observation, this patient is being discharged due to the following reason(s):  2 sets of negative cardiac enzymes, negative CT scan for pulmonary embolus. Elevated EtOH level. Will add medicines for GERD and patient already has appointment with cardiologist early next week. Strict return precautions given. Patient and spouse verbalize understanding and agree with plan of care. ____________________________________________   FINAL CLINICAL IMPRESSION(S) / ED DIAGNOSES  Final diagnoses:  Nonspecific chest pain  Alcoholic intoxication without complication (HCC)  Gastroesophageal reflux disease, esophagitis presence not specified      NEW MEDICATIONS STARTED DURING THIS VISIT:  New Prescriptions   No medications on file     Note:  This document was prepared using Dragon voice recognition software and may include unintentional dictation errors.    Paulette Blanch, MD 01/29/17 [phone]

## 2017-01-29 NOTE — Discharge Instructions (Signed)
1. Start Protonix 40mg  daily. 2. Take Carafate with meals as prescribed. 3. Avoid greasy, spicy foods and alcohol while you are having discomfort. 4. Return to the ER for worsening symptoms, persistent vomiting, difficulty breathing or other concerns.

## 2017-01-29 NOTE — ED Triage Notes (Signed)
Patient reports woke with chest pain tonight.  Reports pain worse with deep breathing and coughing.  Patient reports recent admission for same, no results from stress test yet.

## 2017-01-30 ENCOUNTER — Other Ambulatory Visit: Payer: Self-pay | Admitting: Cardiovascular Disease

## 2017-01-30 DIAGNOSIS — F17210 Nicotine dependence, cigarettes, uncomplicated: Secondary | ICD-10-CM | POA: Diagnosis not present

## 2017-01-30 DIAGNOSIS — R0602 Shortness of breath: Secondary | ICD-10-CM | POA: Diagnosis not present

## 2017-01-30 DIAGNOSIS — R079 Chest pain, unspecified: Secondary | ICD-10-CM | POA: Diagnosis not present

## 2017-01-31 ENCOUNTER — Encounter: Payer: Self-pay | Admitting: Cardiovascular Disease

## 2017-01-31 ENCOUNTER — Ambulatory Visit
Admission: RE | Admit: 2017-01-31 | Discharge: 2017-01-31 | Disposition: A | Discharge status: Home / Self Care | Payer: BLUE CROSS/BLUE SHIELD | Source: Ambulatory Visit | Attending: Cardiovascular Disease | Admitting: Cardiovascular Disease

## 2017-01-31 ENCOUNTER — Encounter
Admission: RE | Disposition: A | Discharge status: Home / Self Care | Payer: Self-pay | Source: Ambulatory Visit | Attending: Cardiovascular Disease

## 2017-01-31 DIAGNOSIS — R079 Chest pain, unspecified: Secondary | ICD-10-CM | POA: Insufficient documentation

## 2017-01-31 DIAGNOSIS — I2510 Atherosclerotic heart disease of native coronary artery without angina pectoris: Secondary | ICD-10-CM | POA: Diagnosis not present

## 2017-01-31 DIAGNOSIS — F17210 Nicotine dependence, cigarettes, uncomplicated: Secondary | ICD-10-CM | POA: Diagnosis not present

## 2017-01-31 DIAGNOSIS — R0609 Other forms of dyspnea: Secondary | ICD-10-CM | POA: Diagnosis not present

## 2017-01-31 DIAGNOSIS — I200 Unstable angina: Secondary | ICD-10-CM | POA: Diagnosis not present

## 2017-01-31 DIAGNOSIS — Z7982 Long term (current) use of aspirin: Secondary | ICD-10-CM | POA: Insufficient documentation

## 2017-01-31 DIAGNOSIS — Z8249 Family history of ischemic heart disease and other diseases of the circulatory system: Secondary | ICD-10-CM | POA: Diagnosis not present

## 2017-01-31 HISTORY — PX: LEFT HEART CATH AND CORONARY ANGIOGRAPHY: CATH118249

## 2017-01-31 SURGERY — LEFT HEART CATH AND CORONARY ANGIOGRAPHY
Anesthesia: Moderate Sedation

## 2017-01-31 MED ORDER — FENTANYL CITRATE (PF) 100 MCG/2ML IJ SOLN
INTRAMUSCULAR | Status: AC
  Filled 2017-01-31: qty 2

## 2017-01-31 MED ORDER — HEPARIN (PORCINE) IN NACL 2-0.9 UNIT/ML-% IJ SOLN
INTRAMUSCULAR | Status: AC
  Filled 2017-01-31: qty 500

## 2017-01-31 MED ORDER — MIDAZOLAM HCL 2 MG/2ML IJ SOLN
INTRAMUSCULAR | Status: DC | PRN
  Administered 2017-01-31: 1 mg via INTRAVENOUS

## 2017-01-31 MED ORDER — SODIUM CHLORIDE 0.9 % WEIGHT BASED INFUSION: 1.0000 mL/kg/h | INTRAVENOUS | Status: DC

## 2017-01-31 MED ORDER — SODIUM CHLORIDE 0.9 % IV SOLN: 250.0000 mL | INTRAVENOUS | Status: DC | PRN

## 2017-01-31 MED ORDER — SODIUM CHLORIDE 0.9% FLUSH: 3.0000 mL | INTRAVENOUS | Status: DC | PRN

## 2017-01-31 MED ORDER — SODIUM CHLORIDE 0.9% FLUSH: 3.0000 mL | Freq: Two times a day (BID) | INTRAVENOUS | Status: DC

## 2017-01-31 MED ORDER — ASPIRIN 81 MG PO CHEW: 81.0000 mg | CHEWABLE_TABLET | ORAL | Status: DC

## 2017-01-31 MED ORDER — SODIUM CHLORIDE 0.9 % WEIGHT BASED INFUSION
3.0000 mL/kg/h | INTRAVENOUS | Status: AC
  Administered 2017-01-31: 3 mL/kg/h via INTRAVENOUS

## 2017-01-31 MED ORDER — FENTANYL CITRATE (PF) 100 MCG/2ML IJ SOLN
INTRAMUSCULAR | Status: DC | PRN
  Administered 2017-01-31: 25 ug via INTRAVENOUS

## 2017-01-31 MED ORDER — IOPAMIDOL (ISOVUE-300) INJECTION 61%
INTRAVENOUS | Status: DC | PRN
  Administered 2017-01-31: 100 mL via INTRA_ARTERIAL

## 2017-01-31 MED ORDER — MIDAZOLAM HCL 2 MG/2ML IJ SOLN
INTRAMUSCULAR | Status: AC
  Filled 2017-01-31: qty 2

## 2017-01-31 SURGICAL SUPPLY — 9 items
CATH 5FR JL4 DIAGNOSTIC (CATHETERS) ×2 IMPLANT
CATH INFINITI 5FR ANG PIGTAIL (CATHETERS) ×2 IMPLANT
CATH INFINITI JR4 5F (CATHETERS) ×2 IMPLANT
DEVICE CLOSURE MYNXGRIP 5F (Vascular Products) ×2 IMPLANT
KIT MANI 3VAL PERCEP (MISCELLANEOUS) ×2 IMPLANT
NEEDLE PERC 18GX7CM (NEEDLE) ×2 IMPLANT
PACK CARDIAC CATH (CUSTOM PROCEDURE TRAY) ×2 IMPLANT
SHEATH PINNACLE 5F 10CM (SHEATH) ×2 IMPLANT
WIRE EMERALD 3MM-J .035X150CM (WIRE) ×2 IMPLANT

## 2017-02-02 DIAGNOSIS — R079 Chest pain, unspecified: Secondary | ICD-10-CM | POA: Diagnosis not present

## 2017-02-02 DIAGNOSIS — R9431 Abnormal electrocardiogram [ECG] [EKG]: Secondary | ICD-10-CM | POA: Diagnosis not present

## 2017-02-08 ENCOUNTER — Encounter: Payer: Self-pay | Admitting: Family

## 2017-02-08 ENCOUNTER — Ambulatory Visit (INDEPENDENT_AMBULATORY_CARE_PROVIDER_SITE_OTHER): Payer: BLUE CROSS/BLUE SHIELD | Admitting: Family

## 2017-02-08 VITALS — BP 140/85 | HR 86 | Temp 98.6°F | Ht 69.0 in | Wt 205.1 lb

## 2017-02-08 DIAGNOSIS — F17200 Nicotine dependence, unspecified, uncomplicated: Secondary | ICD-10-CM | POA: Insufficient documentation

## 2017-02-08 DIAGNOSIS — R079 Chest pain, unspecified: Secondary | ICD-10-CM | POA: Insufficient documentation

## 2017-02-08 DIAGNOSIS — K219 Gastro-esophageal reflux disease without esophagitis: Secondary | ICD-10-CM | POA: Diagnosis not present

## 2017-02-08 DIAGNOSIS — Z720 Tobacco use: Secondary | ICD-10-CM | POA: Insufficient documentation

## 2017-02-08 LAB — BASIC METABOLIC PANEL
BUN: 9 mg/dL (ref 6–23)
CHLORIDE: 101 meq/L (ref 96–112)
CO2: 32 meq/L (ref 19–32)
Calcium: 9.7 mg/dL (ref 8.4–10.5)
Creatinine, Ser: 0.67 mg/dL (ref 0.40–1.50)
GFR: 133.75 mL/min (ref >60.00)
GLUCOSE: 112 mg/dL — AB (ref 70–99)
Potassium: 3.7 mEq/L (ref 3.5–5.1)
SODIUM: 141 meq/L (ref 135–145)

## 2017-02-08 NOTE — Assessment & Plan Note (Addendum)
Improving. Reassured as  Chest wall pain pain is reproducible on exam. As discussed with patient, I feel limited in terms of understanding cardiac work up until we get results from Dr Humphrey Rolls ( requested). I did discuss with patient alternative or contributory factors such as costrochrondritis, MS etiology, anxiety, GERD. Advised that he continue to trial prilosec, and to start back on ASA 81mg . Blood pressure mildly above goal today. He is not taking Imdur. Will await seeing cardiology records prior to starting medication for blood pressure. Advised continued close vigilance during this work up phase and had a long discussion about patient returning to ED without hesitation if ANY chest pain which he very much understands. Follow up one month.

## 2017-02-08 NOTE — Assessment & Plan Note (Signed)
Discussed at length smoking increasing his risk factors for MI. He would like to try without medication at this time and will let me know if unsuccessful. Will follow.

## 2017-02-08 NOTE — Assessment & Plan Note (Signed)
Trial of prilosec. Will follow.

## 2017-02-08 NOTE — Progress Notes (Signed)
Subjective:    Patient ID: Darrell Santiago, male    DOB: [birth year], 51 y.o.   MRN: [id number]  CC: Darrell Santiago is a 51 y.o. male who presents today for follow up.   HPI: Chest pain 'much better' , 2 weeks ago, improved. Still feels 'sore in left side chest area', not worse with activity. . Notices it more since has been ED,'sore to touch.' Not the pressure that it was. More pain when 'pushes on it.' Feels like bruise. Doesn't recall lifting anything heavy or injury.   Worse episode 2 weeks ago after work, walking out to truck, sat in truck for several minutes, and then worked shift that night. Reports had been drinking the day when went to ED. States drank one pint of whiskey. Called the following day our office and was advised to go to ED.  'Felt clamp' when went to ED, which has improved.   Feeling tired and some dizziness when getting up and standing over hood of car. Notes 'panic attacks recently.' Denies  Syncope, exertional numbness or tingling radiating to left arm or jaw, palpitations, dizziness, frequent headaches, changes in vision, or shortness of breath.    Follows with cardiology- not taking Imdur. saw him last week. Unsure of name. See cardiology 02/23/17., thinks Dr Humphrey Rolls.   Has started medication for acid reflux however hasnt seen any difference.   Has had recent stress test, cardiac cath.   Smoker.       He presented to emergency room 10/14 for chest pain.no active cardiopulmonary disease seen on chest x-ray.o evidence of pulmonary embolism was on CT angio; bleb right lung base.atient was observed after 2 sets of negative cardiac enzymes. given medication for acid reflux.  Left heart cath 10/16- unable to see results  ED 01/24/17 - atypical chest pain. Normal troponin x one. Reviewed with Dr. Dan Europe- saw patient that same day ( again cannot see notes)  HISTORY:  Past Medical History:  Diagnosis Date  . Allergy    Seasonal  . Chicken pox   . GERD  (gastroesophageal reflux disease)   . Urine incontinence    Past Surgical History:  Procedure Laterality Date  . CHOLECYSTECTOMY  2000  . LEFT HEART CATH AND CORONARY ANGIOGRAPHY N/A 01/31/2017   Procedure: LEFT HEART CATH AND CORONARY ANGIOGRAPHY;  Surgeon: Dionisio David, MD;  Location: [name];  Service: Cardiovascular;  Laterality: N/A;   Family History  Problem Relation Age of Onset  . Alcohol abuse Father   . Diabetes Father   . Cancer Maternal Grandfather        Colon Cancer    Allergies: Patient has no known allergies. Current Outpatient Prescriptions on File Prior to Visit  Medication Sig Dispense Refill  . pantoprazole (PROTONIX) 40 MG tablet Take 1 tablet (40 mg total) by mouth daily. 30 tablet 0   No current facility-administered medications on file prior to visit.     Social History  Substance Use Topics  . Smoking status: Current Every Day Smoker    Packs/day: 1.00    Types: Cigarettes  . Smokeless tobacco: Never Used  . Alcohol use 12.6 oz/week    21 Shots of liquor per week     Comment: 3-4 a night     Review of Systems  Constitutional: Negative for chills and fever.  Respiratory: Negative for cough, shortness of breath and wheezing.   Cardiovascular: Positive for chest pain. Negative for palpitations.  Gastrointestinal: Negative for nausea  and vomiting.  Psychiatric/Behavioral: The patient is nervous/anxious.       Objective:    BP 140/85   Pulse 86   Temp 98.6 F (37 C) (Oral)   Ht 5\' 9"  (1.753 m)   Wt 205 lb 1.9 oz (93 kg)   SpO2 98%   BMI 30.29 kg/m  BP Readings from Last 3 Encounters:  02/08/17 140/85  01/31/17 140/81  01/29/17 117/86   Wt Readings from Last 3 Encounters:  02/08/17 205 lb 1.9 oz (93 kg)  01/31/17 203 lb (92.1 kg)  01/24/17 200 lb (90.7 kg)    Physical Exam  Constitutional: He appears well-developed and well-nourished.  Cardiovascular: Regular rhythm and normal heart sounds.   Pulmonary/Chest:  Effort normal and breath sounds normal. No respiratory distress. He has no wheezes. He has no rhonchi. He has no rales. He exhibits tenderness. He exhibits no mass, no edema and no deformity.    Diffuse chest wall tenderness with palpation over left side.   Neurological: He is alert.  Skin: Skin is warm and dry.  Psychiatric: He has a normal mood and affect. His speech is normal and behavior is normal.  Vitals reviewed.      Assessment & Plan:   Problem List Items Addressed This Visit      Digestive   GERD (gastroesophageal reflux disease)    Trial of prilosec. Will follow.         Other   Smoking    Discussed at length smoking increasing his risk factors for MI. He would like to try without medication at this time and will let me know if unsuccessful. Will follow.       Chest pain - Primary    Improving. Reassured as  Chest wall pain pain is reproducible on exam. As discussed with patient, I feel limited in terms of understanding cardiac work up until we get results from Dr Humphrey Rolls ( requested). I did discuss with patient alternative or contributory factors such as costrochrondritis, MS etiology, anxiety, GERD. Advised that he continue to trial prilosec, and to start back on ASA 81mg . Blood pressure mildly above goal today. He is not taking Imdur. Will await seeing cardiology records prior to starting medication for blood pressure. Advised continued close vigilance during this work up phase and had a long discussion about patient returning to ED without hesitation if ANY chest pain which he very much understands. Follow up one month.       Relevant Orders   Basic metabolic panel       I have discontinued Darrell Santiago's sucralfate, aspirin, isosorbide mononitrate, and ticagrelor. I am also having him maintain his pantoprazole.   No orders of the defined types were placed in this encounter.   Return precautions given.   Risks, benefits, and alternatives of the medications and  treatment plan prescribed today were discussed, and patient expressed understanding.   Education regarding symptom management and diagnosis given to patient on AVS.  Continue to follow with Coral Spikes, DO for routine health maintenance.   Darrell Santiago and I agreed with plan.   Mable Paris, FNP

## 2017-02-08 NOTE — Progress Notes (Signed)
Pre visit review using our clinic review tool, if applicable. No additional management support is needed unless otherwise documented below in the visit note. 

## 2017-02-08 NOTE — Patient Instructions (Addendum)
Would advise taking daily aspirin  Stay on prilosec  Try heat, Icy Hot for left sided chest wall pain. As discussed ,reassured that chest pain is reproducible.   In the meantime, until I get reports and full understanding of cardiac work up at this time, IF [place], do not hesitate to go back to emergency room- if may be reflux or anxiety however you do not know this until you are seen and with a heart attack, minutes matter, so DO NOT DELAY.   Awaiting reports from Dr Khan/Cardiology  Labs today. Do not take over the counter potassium   Follow up one month with colleague   Heart Attack A heart attack (myocardial infarction, MI) causes damage to the heart that cannot be fixed. A heart attack often happens when a blood clot or other blockage cuts blood flow to the heart. When this happens, certain areas of the heart begin to die. This causes the pain you feel during a heart attack. Follow these instructions at home:  Take medicine as told by your doctor. You may need medicine to: ? Keep your blood from clotting too easily. ? Control your blood pressure. ? Lower your cholesterol. ? Control abnormal heart rhythms.  Change certain behaviors as told by your doctor. This may include: ? Quitting smoking. ? Being active. ? Eating a heart-healthy diet. Ask your doctor for help with this diet. ? Keeping a healthy weight. ? Keeping your diabetes under control. ? Lessening stress. ? Limiting how much alcohol you drink. Do not take these medicines unless your doctor says that you can:  Nonsteroidal anti-inflammatory drugs (NSAIDs). These include: ? Ibuprofen. ? Naproxen. ? Celecoxib.  Vitamin supplements that have vitamin A, vitamin E, or both.  Hormone therapy that contains estrogen with or without progestin.  Get help right away if:  You have sudden chest discomfort.  You have sudden discomfort in your: ? Arms. ? Back. ? Neck. ? Jaw.  You have shortness of  breath at any time.  You have sudden sweating or clammy skin.  You feel sick to your stomach (nauseous) or throw up (vomit).  You suddenly get light-headed or dizzy.  You feel your heart beating fast or skipping beats. These symptoms may be an emergency. Do not wait to see if the symptoms will go away. Get medical help right away. Call your local emergency services (911 in the [place] Do not drive yourself to the hospital. This information is not intended to replace advice given to you by your health care provider. Make sure you discuss any questions you have with your health care provider. Document Released: 10/04/2011 Document Revised: 09/10/2015 Document Reviewed: 06/07/2013 Elsevier Interactive Patient Education  2017 [employer].

## 2017-02-15 DIAGNOSIS — L02511 Cutaneous abscess of right hand: Secondary | ICD-10-CM | POA: Diagnosis not present

## 2017-02-23 DIAGNOSIS — K219 Gastro-esophageal reflux disease without esophagitis: Secondary | ICD-10-CM | POA: Diagnosis not present

## 2017-02-23 DIAGNOSIS — F17210 Nicotine dependence, cigarettes, uncomplicated: Secondary | ICD-10-CM | POA: Diagnosis not present

## 2017-02-23 DIAGNOSIS — R079 Chest pain, unspecified: Secondary | ICD-10-CM | POA: Diagnosis not present

## 2017-03-16 DIAGNOSIS — K219 Gastro-esophageal reflux disease without esophagitis: Secondary | ICD-10-CM | POA: Diagnosis not present

## 2017-03-16 DIAGNOSIS — R0602 Shortness of breath: Secondary | ICD-10-CM | POA: Diagnosis not present

## 2017-03-16 DIAGNOSIS — R079 Chest pain, unspecified: Secondary | ICD-10-CM | POA: Diagnosis not present

## 2017-03-21 ENCOUNTER — Telehealth: Payer: Self-pay

## 2017-03-21 NOTE — Telephone Encounter (Signed)
Scheduled patient for 12/27 for Dr. Aundra Dubin due to work schedule. Patients wife says that she will take him to urgent care after hours if his symptoms worsen   Copied from [facility] #[id number]. Topic: Appointment Scheduling - Scheduling Inquiry for Clinic >> Mar 21, 2017  7:52 AM Synthia Innocent wrote: Reason for CRM: Patient was scheduled for Wednesday 03/22/17 with [place], appt was canceled due to overbooking. Patients wife states he needs to be seen, having panic attacks. No appointments available. Patient is requesting to be worked in, please advise.

## 2017-03-22 ENCOUNTER — Ambulatory Visit: Payer: BLUE CROSS/BLUE SHIELD | Admitting: Family Medicine

## 2017-03-22 ENCOUNTER — Ambulatory Visit: Payer: BLUE CROSS/BLUE SHIELD | Admitting: Internal Medicine

## 2017-04-13 ENCOUNTER — Encounter: Payer: Self-pay | Admitting: Internal Medicine

## 2017-04-13 ENCOUNTER — Ambulatory Visit: Payer: BLUE CROSS/BLUE SHIELD | Admitting: Internal Medicine

## 2017-04-13 VITALS — BP 118/78 | HR 90 | Temp 98.3°F | Resp 16 | Ht 69.0 in | Wt 207.2 lb

## 2017-04-13 DIAGNOSIS — I10 Essential (primary) hypertension: Secondary | ICD-10-CM | POA: Diagnosis not present

## 2017-04-13 DIAGNOSIS — F419 Anxiety disorder, unspecified: Secondary | ICD-10-CM | POA: Diagnosis not present

## 2017-04-13 DIAGNOSIS — F321 Major depressive disorder, single episode, moderate: Secondary | ICD-10-CM

## 2017-04-13 DIAGNOSIS — F329 Major depressive disorder, single episode, unspecified: Secondary | ICD-10-CM

## 2017-04-13 DIAGNOSIS — R9431 Abnormal electrocardiogram [ECG] [EKG]: Secondary | ICD-10-CM | POA: Insufficient documentation

## 2017-04-13 DIAGNOSIS — F32A Depression, unspecified: Secondary | ICD-10-CM

## 2017-04-13 DIAGNOSIS — F17200 Nicotine dependence, unspecified, uncomplicated: Secondary | ICD-10-CM | POA: Diagnosis not present

## 2017-04-13 MED ORDER — LORAZEPAM 0.5 MG PO TABS: 0.5000 mg | ORAL_TABLET | Freq: Every day | ORAL | 2 refills | Status: DC | PRN

## 2017-04-13 MED ORDER — SERTRALINE HCL 50 MG PO TABS: 50.0000 mg | ORAL_TABLET | Freq: Every day | ORAL | 1 refills | Status: DC

## 2017-04-13 NOTE — Progress Notes (Signed)
Chief Complaint  Patient presents with  . Anxiety   F/u  1. Anxiety/panic/depression noted since 01/2017 with decline of dads health, stress of raising their grankids and other life stressors. He denies SI though thinks sometimes he would be better off if he died naturally 2. H/o abnormal EKG cath 01/2017 neg will f/u Dr. Laurelyn Sickle upcoming. No CP today. He stopped Imdur 30 ER b/c he thought it was causing #1  3. Drinking 1 pt lasts 1 week CAGE questions neg. He states he considers his drinking social drinking and has a lot of family and friends that drink like he does. Reviewed CT imaging with evidence of pancreas edema and pseudocyst in the past.     Review of Systems  Constitutional: Negative for weight loss.  HENT: Negative for hearing loss.   Respiratory: Negative for shortness of breath.   Cardiovascular: Negative for chest pain.  Gastrointestinal: Negative for abdominal pain.  Skin: Negative for rash.  Psychiatric/Behavioral: Positive for depression. Negative for memory loss and suicidal ideas. The patient is nervous/anxious.    Past Medical History:  Diagnosis Date  . Allergy    Seasonal  . Chicken pox   . GERD (gastroesophageal reflux disease)   . Urine incontinence    Past Surgical History:  Procedure Laterality Date  . CHOLECYSTECTOMY  2000  . LEFT HEART CATH AND CORONARY ANGIOGRAPHY N/A 01/31/2017   Procedure: LEFT HEART CATH AND CORONARY ANGIOGRAPHY;  Surgeon: Dionisio David, MD;  Location: [facility];  Service: Cardiovascular;  Laterality: N/A;   Family History  Problem Relation Age of Onset  . Alcohol abuse Father   . Diabetes Father   . Cancer Maternal Grandfather        Colon Cancer   Social History   Socioeconomic History  . Marital status: Married    Spouse name: Not on file  . Number of children: Not on file  . Years of education: Not on file  . Highest education level: Not on file  Social Needs  . Financial resource strain: Not on file  .  Food insecurity - worry: Not on file  . Food insecurity - inability: Not on file  . Transportation needs - medical: Not on file  . Transportation needs - non-medical: Not on file  Occupational History  . Not on file  Tobacco Use  . Smoking status: Current Every Day Smoker    Packs/day: 1.00    Types: Cigarettes  . Smokeless tobacco: Never Used  Substance and Sexual Activity  . Alcohol use: Yes    Alcohol/week: 12.6 oz    Types: 21 Shots of liquor per week    Comment: 3-4 a night   . Drug use: No  . Sexual activity: Yes    Partners: Female  Other Topics Concern  . Not on file  Social History Narrative   Married   Employed at [employer] education   Caffeine- Sodas 2-3 a day, tea occasionally, no coffee       Current Meds  Medication Sig  . aspirin 81 MG chewable tablet Chew by mouth.  . losartan (COZAAR) 25 MG tablet Take 25 mg by mouth daily.   . Naproxen Sodium (ALEVE) 220 MG CAPS Take 1 capsule by mouth daily.  . Omega-3 Fatty Acids (FISH OIL) 1000 MG CAPS Take by mouth.  Marland Kitchen OVER THE COUNTER MEDICATION Take 1 capsule by mouth daily.  . pantoprazole (PROTONIX) 40 MG tablet Take 1 tablet (40  mg total) by mouth daily.  . vitamin B-12 (CYANOCOBALAMIN) 1000 MCG tablet Take 1,000 mcg by mouth daily.    No Known Allergies Recent Results (from the past 2160 hour(s))  Basic metabolic panel     Status: Abnormal   Collection Time: 01/24/17  9:38 AM  Result Value Ref Range   Sodium 142 135 - 145 mmol/L   Potassium 3.2 (L) 3.5 - 5.1 mmol/L   Chloride 101 101 - 111 mmol/L   CO2 27 22 - 32 mmol/L   Glucose, Bld 106 (H) 65 - 99 mg/dL   BUN 8 6 - 20 mg/dL   Creatinine, Ser 0.65 0.61 - 1.24 mg/dL   Calcium 9.6 8.9 - 10.3 mg/dL   GFR calc non Af Amer >60 >60 mL/min   GFR calc Af Amer >60 >60 mL/min    Comment: (NOTE) The eGFR has been calculated using the CKD EPI equation. This calculation has not been validated in all clinical situations. eGFR's persistently <60  mL/min signify possible Chronic Kidney Disease.    Anion gap 14 5 - 15  CBC     Status: Abnormal   Collection Time: 01/24/17  9:38 AM  Result Value Ref Range   WBC 5.9 3.8 - 10.6 K/uL   RBC 4.48 4.40 - 5.90 MIL/uL   Hemoglobin 16.8 13.0 - 18.0 g/dL   HCT 47.6 40.0 - 52.0 %   MCV 106.3 (H) 80.0 - 100.0 fL   MCH 37.6 (H) 26.0 - 34.0 pg   MCHC 35.3 32.0 - 36.0 g/dL   RDW 13.7 11.5 - 14.5 %   Platelets 163 150 - 440 K/uL  Troponin I     Status: None   Collection Time: 01/24/17  9:38 AM  Result Value Ref Range   Troponin I <0.03 <0.03 ng/mL  Hepatic function panel     Status: Abnormal   Collection Time: 01/24/17  9:38 AM  Result Value Ref Range   Total Protein 7.3 6.5 - 8.1 g/dL   Albumin 4.4 3.5 - 5.0 g/dL   AST 55 (H) 15 - 41 U/L   ALT 36 17 - 63 U/L   Alkaline Phosphatase 72 38 - 126 U/L   Total Bilirubin 1.3 (H) 0.3 - 1.2 mg/dL   Bilirubin, Direct 0.3 0.1 - 0.5 mg/dL   Indirect Bilirubin 1.0 (H) 0.3 - 0.9 mg/dL  Lipase, blood     Status: None   Collection Time: 01/24/17  9:38 AM  Result Value Ref Range   Lipase 20 11 - 51 U/L  Basic metabolic panel     Status: Abnormal   Collection Time: 01/29/17  1:00 AM  Result Value Ref Range   Sodium 144 135 - 145 mmol/L   Potassium 3.1 (L) 3.5 - 5.1 mmol/L   Chloride 102 101 - 111 mmol/L   CO2 29 22 - 32 mmol/L   Glucose, Bld 115 (H) 65 - 99 mg/dL   BUN 10 6 - 20 mg/dL   Creatinine, Ser 0.74 0.61 - 1.24 mg/dL   Calcium 9.1 8.9 - 10.3 mg/dL   GFR calc non Af Amer >60 >60 mL/min   GFR calc Af Amer >60 >60 mL/min    Comment: (NOTE) The eGFR has been calculated using the CKD EPI equation. This calculation has not been validated in all clinical situations. eGFR's persistently <60 mL/min signify possible Chronic Kidney Disease.    Anion gap 13 5 - 15  CBC     Status: Abnormal   Collection Time:  01/29/17  1:00 AM  Result Value Ref Range   WBC 6.3 3.8 - 10.6 K/uL   RBC 4.57 4.40 - 5.90 MIL/uL   Hemoglobin 16.7 13.0 - 18.0 g/dL    HCT 49.4 40.0 - 52.0 %   MCV 108.2 (H) 80.0 - 100.0 fL   MCH 36.5 (H) 26.0 - 34.0 pg   MCHC 33.8 32.0 - 36.0 g/dL   RDW 14.3 11.5 - 14.5 %   Platelets 170 150 - 440 K/uL  Troponin I     Status: None   Collection Time: 01/29/17  1:00 AM  Result Value Ref Range   Troponin I <0.03 <0.03 ng/mL  Ethanol     Status: Abnormal   Collection Time: 01/29/17  2:18 AM  Result Value Ref Range   Alcohol, Ethyl (B) 228 (H) <10 mg/dL    Comment:        LOWEST DETECTABLE LIMIT FOR SERUM ALCOHOL IS 10 mg/dL FOR MEDICAL PURPOSES ONLY   Troponin I     Status: None   Collection Time: 01/29/17  3:20 AM  Result Value Ref Range   Troponin I <0.03 <0.03 ng/mL  Basic metabolic panel     Status: Abnormal   Collection Time: 02/08/17 12:15 PM  Result Value Ref Range   Sodium 141 135 - 145 mEq/L   Potassium 3.7 3.5 - 5.1 mEq/L   Chloride 101 96 - 112 mEq/L   CO2 32 19 - 32 mEq/L   Glucose, Bld 112 (H) 70 - 99 mg/dL   BUN 9 6 - 23 mg/dL   Creatinine, Ser 0.67 0.40 - 1.50 mg/dL   Calcium 9.7 8.4 - 10.5 mg/dL   GFR 133.75 >60.00 mL/min   Objective  Body mass index is 30.61 kg/m. Wt Readings from Last 3 Encounters:  04/13/17 207 lb 4 oz (94 kg)  02/08/17 205 lb 1.9 oz (93 kg)  01/31/17 203 lb (92.1 kg)   Temp Readings from Last 3 Encounters:  04/13/17 98.3 F (36.8 C) (Oral)  02/08/17 98.6 F (37 C) (Oral)  01/31/17 98.2 F (36.8 C) (Oral)   BP Readings from Last 3 Encounters:  04/13/17 118/78  02/08/17 140/85  01/31/17 140/81   Pulse Readings from Last 3 Encounters:  04/13/17 90  02/08/17 86  01/31/17 72   O2 sat 95% room air   Physical Exam  Constitutional: He is oriented to person, place, and time and well-developed, well-nourished, and in no distress. Vital signs are normal.  HENT:  Head: Normocephalic and atraumatic.  Mouth/Throat: Oropharynx is clear and moist and mucous membranes are normal.  Eyes: Pupils are equal, round, and reactive to light.  Cardiovascular: Normal  rate, regular rhythm and normal heart sounds.  Pulmonary/Chest: Effort normal and breath sounds normal.  Abdominal: Soft. Bowel sounds are normal. There is no tenderness.  Neurological: He is alert and oriented to person, place, and time. Gait normal. Gait normal.  Skin: Skin is warm and dry.  Nevi angiomas trunk   Psychiatric: Memory, affect and judgment normal. He exhibits a depressed mood.  Tearful on exam   Nursing note and vitals reviewed.   Assessment   1. Anxiety/panic/depression PHQ 9 score 21 today denies SI  2. HTN controlled  3. C/w alcohol abuse and evidence of pancreatitis on CT and pseduocyst drinking 1 pt last 1 week 4/4 CAGE questions negative  4. HM 5. H/o abnormal EKG Plan  1.  Start Zoloft 50 mg qd Ativan 0.5 prn qd  Refer to [place]  therapy  F/u in 4-6 weeks  Reduce etoh intake  2. Cont meds   3. rec reduce etoh intake  Check labs at f/u [facility], lipid, UA, TSH, T4, B12, folate, PSA ask about STD check check hep B status   4.  Declines flu shot Tdap had 01/2015  Check hep B status   rec pna 23 vaccine in future with h/o smoking and rec smoking cessation.   4/4 CAGE ? Neg.   See labs as above  Disc colonoscopy today pt hesistant   5. Upcoming f/u cards Dr. Laurelyn Sickle  Of note he stopped Imdur on his own to disc with cards.   Provider: Dr. Olivia Mackie McLean-Scocuzza-[facility]

## 2017-04-13 NOTE — Patient Instructions (Addendum)
Follow up in 4-6 weeks sooner if needed  No food only water and pills before next visit  Take to cardiology about stopping Imdur Think about quitting smoking and pneumonia 23 vaccine Reduce alcohol intake   Sertraline tablets What is this medicine? SERTRALINE (SER tra leen) is used to treat depression. It may also be used to treat obsessive compulsive disorder, panic disorder, post-trauma stress, premenstrual dysphoric disorder (PMDD) or social anxiety. This medicine may be used for other purposes; ask your health care provider or pharmacist if you have questions. COMMON BRAND NAME(S): Zoloft What should I tell my health care provider before I take this medicine? They need to know if you have any of these conditions: -bleeding disorders -bipolar disorder or a family history of bipolar disorder -glaucoma -heart disease -high blood pressure -history of irregular heartbeat -history of low levels of calcium, magnesium, or potassium in the blood -if you often drink alcohol -liver disease -receiving electroconvulsive therapy -seizures -suicidal thoughts, plans, or attempt; a previous suicide attempt by you or a family member -take medicines that treat or prevent blood clots -thyroid disease -an unusual or allergic reaction to sertraline, other medicines, foods, dyes, or preservatives -pregnant or trying to get pregnant -breast-feeding How should I use this medicine? Take this medicine by mouth with a glass of water. Follow the directions on the prescription label. You can take it with or without food. Take your medicine at regular intervals. Do not take your medicine more often than directed. Do not stop taking this medicine suddenly except upon the advice of your doctor. Stopping this medicine too quickly may cause serious side effects or your condition may worsen. A special MedGuide will be given to you by the pharmacist with each prescription and refill. Be sure to read this information  carefully each time. Talk to your pediatrician regarding the use of this medicine in children. While this drug may be prescribed for children as young as 7 years for selected conditions, precautions do apply. Overdosage: If you think you have taken too much of this medicine contact a poison control center or emergency room at once. NOTE: This medicine is only for you. Do not share this medicine with others. What if I miss a dose? If you miss a dose, take it as soon as you can. If it is almost time for your next dose, take only that dose. Do not take double or extra doses. What may interact with this medicine? Do not take this medicine with any of the following medications: -cisapride -dofetilide -dronedarone -linezolid -MAOIs like Carbex, Eldepryl, Marplan, Nardil, and Parnate -methylene blue (injected into a vein) -pimozide -thioridazine This medicine may also interact with the following medications: -alcohol -amphetamines -aspirin and aspirin-like medicines -certain medicines for depression, anxiety, or psychotic disturbances -certain medicines for fungal infections like ketoconazole, fluconazole, posaconazole, and itraconazole -certain medicines for irregular heart beat like flecainide, quinidine, propafenone -certain medicines for migraine headaches like almotriptan, eletriptan, frovatriptan, naratriptan, rizatriptan, sumatriptan, zolmitriptan -certain medicines for sleep -certain medicines for seizures like carbamazepine, valproic acid, phenytoin -certain medicines that treat or prevent blood clots like warfarin, enoxaparin, dalteparin -cimetidine -digoxin -diuretics -fentanyl -isoniazid -lithium -NSAIDs, medicines for pain and inflammation, like ibuprofen or naproxen -other medicines that prolong the QT interval (cause an abnormal heart rhythm) -rasagiline -safinamide -supplements like St. John's wort, kava kava, valerian -tolbutamide -tramadol -tryptophan This list may  not describe all possible interactions. Give your health care provider a list of all the medicines, herbs, non-prescription drugs, or  dietary supplements you use. Also tell them if you smoke, drink alcohol, or use illegal drugs. Some items may interact with your medicine. What should I watch for while using this medicine? Tell your doctor if your symptoms do not get better or if they get worse. Visit your doctor or health care professional for regular checks on your progress. Because it may take several weeks to see the full effects of this medicine, it is important to continue your treatment as prescribed by your doctor. Patients and their families should watch out for new or worsening thoughts of suicide or depression. Also watch out for sudden changes in feelings such as feeling anxious, agitated, panicky, irritable, hostile, aggressive, impulsive, severely restless, overly excited and hyperactive, or not being able to sleep. If this happens, especially at the beginning of treatment or after a change in dose, call your health care professional. Dennis Bast may get drowsy or dizzy. Do not drive, use machinery, or do anything that needs mental alertness until you know how this medicine affects you. Do not stand or sit up quickly, especially if you are an older patient. This reduces the risk of dizzy or fainting spells. Alcohol may interfere with the effect of this medicine. Avoid alcoholic drinks. Your mouth may get dry. Chewing sugarless gum or sucking hard candy, and drinking plenty of water may help. Contact your doctor if the problem does not go away or is severe. What side effects may I notice from receiving this medicine? Side effects that you should report to your doctor or health care professional as soon as possible: -allergic reactions like skin rash, itching or hives, swelling of the face, lips, or tongue -anxious -black, tarry stools -changes in vision -confusion -elevated mood, decreased need for  sleep, racing thoughts, impulsive behavior -eye pain -fast, irregular heartbeat -feeling faint or lightheaded, falls -feeling agitated, angry, or irritable -hallucination, loss of contact with reality -loss of balance or coordination -loss of memory -painful or prolonged erections -restlessness, pacing, inability to keep still -seizures -stiff muscles -suicidal thoughts or other mood changes -trouble sleeping -unusual bleeding or bruising -unusually weak or tired -vomiting Side effects that usually do not require medical attention (report to your doctor or health care professional if they continue or are bothersome): -change in appetite or weight -change in sex drive or performance -diarrhea -increased sweating -indigestion, nausea -tremors This list may not describe all possible side effects. Call your doctor for medical advice about side effects. You may report side effects to FDA at 1-800-FDA-1088. Where should I keep my medicine? Keep out of the reach of children. Store at room temperature between 15 and 30 degrees C (59 and 86 degrees F). Throw away any unused medicine after the expiration date. NOTE: This sheet is a summary. It may not cover all possible information. If you have questions about this medicine, talk to your doctor, pharmacist, or health care provider.  2018 Elsevier/Gold Standard (2016-04-08 14:17:49)   Major Depressive Disorder, Adult Major depressive disorder (MDD) is a mental health condition. MDD often makes you feel sad, hopeless, or helpless. MDD can also cause symptoms in your body. MDD can affect your:  Work.  School.  Relationships.  Other normal activities.  MDD can range from mild to very bad. It may occur once (single episode MDD). It can also occur many times (recurrent MDD). The main symptoms of MDD often include:  Feeling sad, depressed, or irritable most of the time.  Loss of interest.  MDD symptoms also include:  Sleeping too  much or too little.  Eating too much or too little.  A change in your weight.  Feeling tired (fatigue) or having low energy.  Feeling worthless.  Feeling guilty.  Trouble making decisions.  Trouble thinking clearly.  Thoughts of suicide or harming others.  Feeling weak.  Feeling agitated.  Keeping yourself from being around other people (isolation).  Follow these instructions at home: Activity  Do these things as told by your doctor: ? Go back to your normal activities. ? Exercise regularly. ? Spend time outdoors. Alcohol  Talk with your doctor about how alcohol can affect your antidepressant medicines.  Do not drink alcohol. Or, limit how much alcohol you drink. ? This means no more than 1 drink a day for nonpregnant women and 2 drinks a day for men. One drink equals one of these:  12 oz of beer.  5 oz of wine.  1 oz of hard liquor. General instructions  Take over-the-counter and prescription medicines only as told by your doctor.  Eat a healthy diet.  Get plenty of sleep.  Find activities that you enjoy. Make time to do them.  Think about joining a support group. Your doctor may be able to suggest a group for you.  Keep all follow-up visits as told by your doctor. This is important. Where to find more information:  [employer] on Mental Illness: ? www.nami.[place]: ? https://carter.com/  National Suicide Prevention Lifeline: ? (857)644-5561. This is free, 24-hour help. Contact a doctor if:  Your symptoms get worse.  You have new symptoms. Get help right away if:  You self-harm.  You see, hear, taste, smell, or feel things that are not present (hallucinate). If you ever feel like you may hurt yourself or others, or have thoughts about taking your own life, get help right away. You can go to your nearest emergency department or call:  Your local emergency services (911 in the [place]  A suicide  crisis helpline, such as the National Suicide Prevention Lifeline: ? (410)642-5818. This is open 24 hours a day.  This information is not intended to replace advice given to you by your health care provider. Make sure you discuss any questions you have with your health care provider. Document Released: 03/16/2015 Document Revised: 12/20/2015 Document Reviewed: 12/20/2015 Elsevier Interactive Patient Education  2017 [employer].  Generalized Anxiety Disorder, Adult Generalized anxiety disorder (GAD) is a mental health disorder. People with this condition constantly worry about everyday events. Unlike normal anxiety, worry related to GAD is not triggered by a specific event. These worries also do not fade or get better with time. GAD interferes with life functions, including relationships, work, and school. GAD can vary from mild to severe. People with severe GAD can have intense waves of anxiety with physical symptoms (panic attacks). What are the causes? The exact cause of GAD is not known. What increases the risk? This condition is more likely to develop in:  Women.  People who have a family history of anxiety disorders.  People who are very shy.  People who experience very stressful life events, such as the death of a loved one.  People who have a very stressful family environment.  What are the signs or symptoms? People with GAD often worry excessively about many things in their lives, such as their health and family. They may also be overly concerned about:  Doing well at work.  Being on time.  Natural disasters.  Friendships.  Physical symptoms of GAD include:  Fatigue.  Muscle tension or having muscle twitches.  Trembling or feeling shaky.  Being easily startled.  Feeling like your heart is pounding or racing.  Feeling out of breath or like you cannot take a deep breath.  Having trouble falling asleep or staying asleep.  Sweating.  Nausea, diarrhea, or  irritable bowel syndrome (IBS).  Headaches.  Trouble concentrating or remembering facts.  Restlessness.  Irritability.  How is this diagnosed? Your health care provider can diagnose GAD based on your symptoms and medical history. You will also have a physical exam. The health care provider will ask specific questions about your symptoms, including how severe they are, when they started, and if they come and go. Your health care provider may ask you about your use of alcohol or drugs, including prescription medicines. Your health care provider may refer you to a mental health specialist for further evaluation. Your health care provider will do a thorough examination and may perform additional tests to rule out other possible causes of your symptoms. To be diagnosed with GAD, a person must have anxiety that:  Is out of his or her control.  Affects several different aspects of his or her life, such as work and relationships.  Causes distress that makes him or her unable to take part in normal activities.  Includes at least three physical symptoms of GAD, such as restlessness, fatigue, trouble concentrating, irritability, muscle tension, or sleep problems.  Before your health care provider can confirm a diagnosis of GAD, these symptoms must be present more days than they are not, and they must last for six months or longer. How is this treated? The following therapies are usually used to treat GAD:  Medicine. Antidepressant medicine is usually prescribed for long-term daily control. Antianxiety medicines may be added in severe cases, especially when panic attacks occur.  Talk therapy (psychotherapy). Certain types of talk therapy can be helpful in treating GAD by providing support, education, and guidance. Options include: ? Cognitive behavioral therapy (CBT). People learn coping skills and techniques to ease their anxiety. They learn to identify unrealistic or negative thoughts and  behaviors and to replace them with positive ones. ? Acceptance and commitment therapy (ACT). This treatment teaches people how to be mindful as a way to cope with unwanted thoughts and feelings. ? Biofeedback. This process trains you to manage your body's response (physiological response) through breathing techniques and relaxation methods. You will work with a therapist while machines are used to monitor your physical symptoms.  Stress management techniques. These include yoga, meditation, and exercise.  A mental health specialist can help determine which treatment is best for you. Some people see improvement with one type of therapy. However, other people require a combination of therapies. Follow these instructions at home:  Take over-the-counter and prescription medicines only as told by your health care provider.  Try to maintain a normal routine.  Try to anticipate stressful situations and allow extra time to manage them.  Practice any stress management or self-calming techniques as taught by your health care provider.  Do not punish yourself for setbacks or for not making progress.  Try to recognize your accomplishments, even if they are small.  Keep all follow-up visits as told by your health care provider. This is important. Contact a health care provider if:  Your symptoms do not get better.  Your symptoms get worse.  You have signs of depression, such as: ? A persistently sad,  cranky, or irritable mood. ? Loss of enjoyment in activities that used to bring you joy. ? Change in weight or eating. ? Changes in sleeping habits. ? Avoiding friends or family members. ? Loss of energy for normal tasks. ? Feelings of guilt or worthlessness. Get help right away if:  You have serious thoughts about hurting yourself or others. If you ever feel like you may hurt yourself or others, or have thoughts about taking your own life, get help right away. You can go to your nearest  emergency department or call:  Your local emergency services (911 in the [place]  A suicide crisis helpline, such as the [place] at 2208236999. This is open 24 hours a day.  Summary  Generalized anxiety disorder (GAD) is a mental health disorder that involves worry that is not triggered by a specific event.  People with GAD often worry excessively about many things in their lives, such as their health and family.  GAD may cause physical symptoms such as restlessness, trouble concentrating, sleep problems, frequent sweating, nausea, diarrhea, headaches, and trembling or muscle twitching.  A mental health specialist can help determine which treatment is best for you. Some people see improvement with one type of therapy. However, other people require a combination of therapies. This information is not intended to replace advice given to you by your health care provider. Make sure you discuss any questions you have with your health care provider. Document Released: 07/30/2012 Document Revised: 02/23/2016 Document Reviewed: 02/23/2016 Elsevier Interactive Patient Education  [employer].

## 2017-06-05 ENCOUNTER — Ambulatory Visit (INDEPENDENT_AMBULATORY_CARE_PROVIDER_SITE_OTHER): Payer: BLUE CROSS/BLUE SHIELD | Admitting: Internal Medicine

## 2017-06-05 ENCOUNTER — Encounter: Payer: Self-pay | Admitting: Internal Medicine

## 2017-06-05 VITALS — BP 144/76 | HR 75 | Temp 98.6°F | Ht 69.0 in | Wt 206.8 lb

## 2017-06-05 DIAGNOSIS — Z1159 Encounter for screening for other viral diseases: Secondary | ICD-10-CM | POA: Diagnosis not present

## 2017-06-05 DIAGNOSIS — R945 Abnormal results of liver function studies: Secondary | ICD-10-CM | POA: Diagnosis not present

## 2017-06-05 DIAGNOSIS — F419 Anxiety disorder, unspecified: Secondary | ICD-10-CM | POA: Diagnosis not present

## 2017-06-05 DIAGNOSIS — Z13220 Encounter for screening for lipoid disorders: Secondary | ICD-10-CM

## 2017-06-05 DIAGNOSIS — Z1329 Encounter for screening for other suspected endocrine disorder: Secondary | ICD-10-CM

## 2017-06-05 DIAGNOSIS — F321 Major depressive disorder, single episode, moderate: Secondary | ICD-10-CM

## 2017-06-05 DIAGNOSIS — I10 Essential (primary) hypertension: Secondary | ICD-10-CM | POA: Diagnosis not present

## 2017-06-05 DIAGNOSIS — F32A Depression, unspecified: Secondary | ICD-10-CM

## 2017-06-05 DIAGNOSIS — D7589 Other specified diseases of blood and blood-forming organs: Secondary | ICD-10-CM

## 2017-06-05 DIAGNOSIS — F329 Major depressive disorder, single episode, unspecified: Secondary | ICD-10-CM | POA: Diagnosis not present

## 2017-06-05 DIAGNOSIS — R7989 Other specified abnormal findings of blood chemistry: Secondary | ICD-10-CM

## 2017-06-05 LAB — COMPREHENSIVE METABOLIC PANEL
ALT: 39 U/L (ref 0–53)
AST: 87 U/L — AB (ref 0–37)
Albumin: 3.9 g/dL (ref 3.5–5.2)
Alkaline Phosphatase: 108 U/L (ref 39–117)
BILIRUBIN TOTAL: 0.8 mg/dL (ref 0.2–1.2)
BUN: 8 mg/dL (ref 6–23)
CALCIUM: 9 mg/dL (ref 8.4–10.5)
CO2: 34 meq/L — AB (ref 19–32)
CREATININE: 0.58 mg/dL (ref 0.40–1.50)
Chloride: 102 mEq/L (ref 96–112)
GFR: 157.77 mL/min (ref >60.00)
GLUCOSE: 118 mg/dL — AB (ref 70–99)
Potassium: 4.3 mEq/L (ref 3.5–5.1)
Sodium: 142 mEq/L (ref 135–145)
Total Protein: 6.7 g/dL (ref 6.0–8.3)

## 2017-06-05 LAB — CBC WITH DIFFERENTIAL/PLATELET
BASOS ABS: 0 10*3/uL (ref 0.0–0.1)
Basophils Relative: 0.8 % (ref 0.0–3.0)
EOS ABS: 0.1 10*3/uL (ref 0.0–0.7)
Eosinophils Relative: 1.1 % (ref 0.0–5.0)
HCT: 44.8 % (ref 39.0–52.0)
Hemoglobin: 15.6 g/dL (ref 13.0–17.0)
LYMPHS ABS: 0.8 10*3/uL (ref 0.7–4.0)
LYMPHS PCT: 16.6 % (ref 12.0–46.0)
MCHC: 34.8 g/dL (ref 30.0–36.0)
MCV: 110 fl — ABNORMAL HIGH (ref 78.0–100.0)
MONO ABS: 0.4 10*3/uL (ref 0.1–1.0)
Monocytes Relative: 7.9 % (ref 3.0–12.0)
NEUTROS ABS: 3.6 10*3/uL (ref 1.4–7.7)
NEUTROS PCT: 73.6 % (ref 43.0–77.0)
PLATELETS: 174 10*3/uL (ref 150.0–400.0)
RBC: 4.07 Mil/uL — ABNORMAL LOW (ref 4.22–5.81)
RDW: 14.7 % (ref 11.5–15.5)
WBC: 4.9 10*3/uL (ref 4.0–10.5)

## 2017-06-05 LAB — LIPID PANEL
CHOLESTEROL: 200 mg/dL (ref 0–200)
HDL: 50.8 mg/dL (ref >39.00)
LDL Cholesterol: 120 mg/dL — ABNORMAL HIGH (ref 0–99)
NonHDL: 149
TRIGLYCERIDES: 144 mg/dL (ref 0.0–149.0)
Total CHOL/HDL Ratio: 4
VLDL: 28.8 mg/dL (ref 0.0–40.0)

## 2017-06-05 LAB — T4, FREE: FREE T4: 0.67 ng/dL (ref 0.60–1.60)

## 2017-06-05 LAB — URINALYSIS, ROUTINE W REFLEX MICROSCOPIC
Hgb urine dipstick: NEGATIVE
KETONES UR: NEGATIVE
LEUKOCYTES UA: NEGATIVE
NITRITE: NEGATIVE
PH: 7.5 (ref 5.0–8.0)
RBC / HPF: NONE SEEN (ref 0–?)
Specific Gravity, Urine: 1.02 (ref 1.000–1.030)
Urine Glucose: NEGATIVE
Urobilinogen, UA: 1 (ref 0.0–1.0)

## 2017-06-05 LAB — TSH: TSH: 2.37 u[IU]/mL (ref 0.35–4.50)

## 2017-06-05 MED ORDER — SERTRALINE HCL 50 MG PO TABS: 50.0000 mg | ORAL_TABLET | Freq: Every day | ORAL | 5 refills | Status: DC

## 2017-06-05 NOTE — Progress Notes (Signed)
Chief Complaint  Patient presents with  . Follow-up   4-6 week f/u  1. Anxiety depression doing better on Zoloft 50 mg qd not having to take Ativan 0.5 prn enjoying activities and family more 2. Tobacco and alcohol abuse reduced intake of both and really feeling better     Review of Systems  Constitutional: Negative for weight loss.  HENT: Negative for hearing loss.   Eyes: Negative for blurred vision.  Respiratory: Negative for shortness of breath.   Cardiovascular: Negative for chest pain.  Gastrointestinal: Negative for abdominal pain.  Musculoskeletal: Negative for falls.  Skin: Negative for rash.  Neurological: Negative for headaches.  Psychiatric/Behavioral: Negative for depression. The patient is not nervous/anxious.    Past Medical History:  Diagnosis Date  . Abnormal EKG   . Allergy    Seasonal  . Anxiety   . Chicken pox   . Depression   . GERD (gastroesophageal reflux disease)   . Hypertension   . Urine incontinence    Past Surgical History:  Procedure Laterality Date  . CHOLECYSTECTOMY  2000  . LEFT HEART CATH AND CORONARY ANGIOGRAPHY N/A 01/31/2017   Procedure: LEFT HEART CATH AND CORONARY ANGIOGRAPHY;  Surgeon: Dionisio David, MD;  Location: [facility];  Service: Cardiovascular;  Laterality: N/A;   Family History  Problem Relation Age of Onset  . Alcohol abuse Father   . Diabetes Father   . Cancer Father        ?stage IV thyroid  . Cancer Maternal Grandfather        Colon Cancer   Social History   Socioeconomic History  . Marital status: Married    Spouse name: Not on file  . Number of children: Not on file  . Years of education: Not on file  . Highest education level: Not on file  Social Needs  . Financial resource strain: Not on file  . Food insecurity - worry: Not on file  . Food insecurity - inability: Not on file  . Transportation needs - medical: Not on file  . Transportation needs - non-medical: Not on file  Occupational  History  . Not on file  Tobacco Use  . Smoking status: Current Every Day Smoker    Packs/day: 1.00    Types: Cigarettes  . Smokeless tobacco: Never Used  Substance and Sexual Activity  . Alcohol use: Yes    Alcohol/week: 12.6 oz    Types: 21 Shots of liquor per week    Comment: 1 pt will last 1 week   . Drug use: No  . Sexual activity: Yes    Partners: Female  Other Topics Concern  . 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 aspirin 81 MG chewable tablet Chew by mouth.  Marland Kitchen LORazepam (ATIVAN) 0.5 MG tablet Take 1 tablet (0.5 mg total) by mouth daily as needed for anxiety.  Marland Kitchen losartan (COZAAR) 25 MG tablet Take 25 mg by mouth daily.   . Naproxen Sodium (ALEVE) 220 MG CAPS Take 1 capsule by mouth daily.  . Omega-3 Fatty Acids (FISH OIL) 1000 MG CAPS Take by mouth.  Marland Kitchen OVER THE COUNTER MEDICATION Take 1 capsule by mouth daily.  . pantoprazole (PROTONIX) 40 MG tablet Take 1 tablet (40 mg total) by  mouth daily.  . sertraline (ZOLOFT) 50 MG tablet Take 1 tablet (50 mg total) by mouth daily. In am  . vitamin B-12 (CYANOCOBALAMIN) 1000 MCG tablet Take 1,000 mcg by mouth daily.    No Known Allergies No results found for this or any previous visit (from the past 2160 hour(s)). Objective  Body mass index is 30.54 kg/m. Wt Readings from Last 3 Encounters:  06/05/17 206 lb 12.8 oz (93.8 kg)  04/13/17 207 lb 4 oz (94 kg)  02/08/17 205 lb 1.9 oz (93 kg)   Temp Readings from Last 3 Encounters:  06/05/17 98.6 F (37 C) (Oral)  04/13/17 98.3 F (36.8 C) (Oral)  02/08/17 98.6 F (37 C) (Oral)   BP Readings from Last 3 Encounters:  06/05/17 (!) 144/76  04/13/17 118/78  02/08/17 140/85   Pulse Readings from Last 3 Encounters:  06/05/17 75  04/13/17 90  02/08/17 86   O2 sat room air 97% Physical Exam   Constitutional: He is oriented to person, place, and time and well-developed, well-nourished, and in no distress.  HENT:  Head: Normocephalic and atraumatic.  Mouth/Throat: Oropharynx is clear and moist and mucous membranes are normal.  Eyes: Conjunctivae are normal. Pupils are equal, round, and reactive to light.  Cardiovascular: Normal rate, regular rhythm and normal heart sounds.  Pulmonary/Chest: Effort normal and breath sounds normal.  Neurological: He is alert and oriented to person, place, and time. Gait normal. Gait normal.  Skin: Skin is warm and dry.  lentigos and nevi to back   Psychiatric: Mood, memory, affect and judgment normal.  Nursing note and vitals reviewed.   Assessment   1. Anxiety/depression improved  2. HTN  3. Macrocytosis  4. HM Plan  1. Cont Zoloft 50 mg qd  2. Advise pt to take Losartan 25 mg in am  3. Likely 2/2 etoh rec take MVT and reduce etoh  Considered B12 and folate likely insurance will not cover  Repeat CBC today  4.  Declines flu shot, pna 23  Tdap UTD   rec smoking cessation pt has cut back  Disc colonscopy pt wants to think about it  Check PSA age 52 y.o  Check CMET, CBC, lipid, UA, TSH, T4 hep B status  Provider: Dr. Olivia Mackie McLean-Scocuzza-[facility]

## 2017-06-05 NOTE — Patient Instructions (Addendum)
Please f/u in 3-6 months sooner if needed  Try to keep cutting your cigarettes back  Please take Losartan for blood pressure 25 mg daily in the am  Try multivitamin for men Natures Own or Centrum Silver daily  Think about colonoscopy    DASH Eating Plan DASH stands for "Dietary Approaches to Stop Hypertension." The DASH eating plan is a healthy eating plan that has been shown to reduce high blood pressure (hypertension). It may also reduce your risk for type 2 diabetes, heart disease, and stroke. The DASH eating plan may also help with weight loss. What are tips for following this plan? General guidelines  Avoid eating more than 2,300 mg (milligrams) of salt (sodium) a day. If you have hypertension, you may need to reduce your sodium intake to 1,500 mg a day.  Limit alcohol intake to no more than 1 drink a day for nonpregnant women and 2 drinks a day for men. One drink equals 12 oz of beer, 5 oz of wine, or 1 oz of hard liquor.  Work with your health care provider to maintain a healthy body weight or to lose weight. Ask what an ideal weight is for you.  Get at least 30 minutes of exercise that causes your heart to beat faster (aerobic exercise) most days of the week. Activities may include walking, swimming, or biking.  Work with your health care provider or diet and nutrition specialist (dietitian) to adjust your eating plan to your individual calorie needs. Reading food labels  Check food labels for the amount of sodium per serving. Choose foods with less than 5 percent of the Daily Value of sodium. Generally, foods with less than 300 mg of sodium per serving fit into this eating plan.  To find whole grains, look for the word "whole" as the first word in the ingredient list. Shopping  Buy products labeled as "low-sodium" or "no salt added."  Buy fresh foods. Avoid canned foods and premade or frozen meals. Cooking  Avoid adding salt when cooking. Use salt-free seasonings or herbs  instead of table salt or sea salt. Check with your health care provider or pharmacist before using salt substitutes.  Do not fry foods. Cook foods using healthy methods such as baking, boiling, grilling, and broiling instead.  Cook with heart-healthy oils, such as olive, canola, soybean, or sunflower oil. Meal planning   Eat a balanced diet that includes: ? 5 or more servings of fruits and vegetables each day. At each meal, try to fill half of your plate with fruits and vegetables. ? Up to 6-8 servings of whole grains each day. ? Less than 6 oz of lean meat, poultry, or fish each day. A 3-oz serving of meat is about the same size as a deck of cards. One egg equals 1 oz. ? 2 servings of low-fat dairy each day. ? A serving of nuts, seeds, or beans 5 times each week. ? Heart-healthy fats. Healthy fats called Omega-3 fatty acids are found in foods such as flaxseeds and coldwater fish, like sardines, salmon, and mackerel.  Limit how much you eat of the following: ? Canned or prepackaged foods. ? Food that is high in trans fat, such as fried foods. ? Food that is high in saturated fat, such as fatty meat. ? Sweets, desserts, sugary drinks, and other foods with added sugar. ? Full-fat dairy products.  Do not salt foods before eating.  Try to eat at least 2 vegetarian meals each week.  Eat more home-cooked  food and less restaurant, buffet, and fast food.  When eating at a restaurant, ask that your food be prepared with less salt or no salt, if possible. What foods are recommended? The items listed may not be a complete list. Talk with your dietitian about what dietary choices are best for you. Grains Whole-grain or whole-wheat bread. Whole-grain or whole-wheat pasta. Brown rice. [place]row. Bulgur. Whole-grain and low-sodium cereals. Pita bread. Low-fat, low-sodium crackers. Whole-wheat flour tortillas. Vegetables Fresh or frozen vegetables (raw, steamed, roasted, or grilled).  Low-sodium or reduced-sodium tomato and vegetable juice. Low-sodium or reduced-sodium tomato sauce and tomato paste. Low-sodium or reduced-sodium canned vegetables. Fruits All fresh, dried, or frozen fruit. Canned fruit in natural juice (without added sugar). Meat and other protein foods Skinless chicken or [place]. Ground chicken or [place]. Pork with fat trimmed off. Fish and seafood. Egg whites. Dried beans, peas, or lentils. Unsalted nuts, nut butters, and seeds. Unsalted canned beans. Lean cuts of beef with fat trimmed off. Low-sodium, lean deli meat. Dairy Low-fat (1%) or fat-free (skim) milk. Fat-free, low-fat, or reduced-fat cheeses. Nonfat, low-sodium ricotta or cottage cheese. Low-fat or nonfat yogurt. Low-fat, low-sodium cheese. Fats and oils Soft margarine without trans fats. Vegetable oil. Low-fat, reduced-fat, or light mayonnaise and salad dressings (reduced-sodium). Canola, safflower, olive, soybean, and sunflower oils. Avocado. Seasoning and other foods Herbs. Spices. Seasoning mixes without salt. Unsalted popcorn and pretzels. Fat-free sweets. What foods are not recommended? The items listed may not be a complete list. Talk with your dietitian about what dietary choices are best for you. Grains Baked goods made with fat, such as croissants, muffins, or some breads. Dry pasta or rice meal packs. Vegetables Creamed or fried vegetables. Vegetables in a cheese sauce. Regular canned vegetables (not low-sodium or reduced-sodium). Regular canned tomato sauce and paste (not low-sodium or reduced-sodium). Regular tomato and vegetable juice (not low-sodium or reduced-sodium). Angie Fava. Olives. Fruits Canned fruit in a light or heavy syrup. Fried fruit. Fruit in cream or butter sauce. Meat and other protein foods Fatty cuts of meat. Ribs. Fried meat. Berniece Salines. Sausage. Bologna and other processed lunch meats. Salami. Fatback. Hotdogs. Bratwurst. Salted nuts and seeds. Canned beans with added  salt. Canned or smoked fish. Whole eggs or egg yolks. Chicken or [place] with skin. Dairy Whole or 2% milk, cream, and half-and-half. Whole or full-fat cream cheese. Whole-fat or sweetened yogurt. Full-fat cheese. Nondairy creamers. Whipped toppings. Processed cheese and cheese spreads. Fats and oils Butter. Stick margarine. Lard. Shortening. Ghee. Bacon fat. Tropical oils, such as coconut, palm kernel, or palm oil. Seasoning and other foods Salted popcorn and pretzels. Onion salt, garlic salt, seasoned salt, table salt, and sea salt. Worcestershire sauce. Tartar sauce. Barbecue sauce. Teriyaki sauce. Soy sauce, including reduced-sodium. Steak sauce. Canned and packaged gravies. Fish sauce. Oyster sauce. Cocktail sauce. Horseradish that you find on the shelf. Ketchup. Mustard. Meat flavorings and tenderizers. Bouillon cubes. Hot sauce and Tabasco sauce. Premade or packaged marinades. Premade or packaged taco seasonings. Relishes. Regular salad dressings. Where to find more information:  National Heart, Lung, and [place]: https://wilson-eaton.com/  American Heart Association: www.heart.org Summary  The DASH eating plan is a healthy eating plan that has been shown to reduce high blood pressure (hypertension). It may also reduce your risk for type 2 diabetes, heart disease, and stroke.  With the DASH eating plan, you should limit salt (sodium) intake to 2,300 mg a day. If you have hypertension, you may need to reduce your sodium intake to 1,500 mg  a day.  When on the DASH eating plan, aim to eat more fresh fruits and vegetables, whole grains, lean proteins, low-fat dairy, and heart-healthy fats.  Work with your health care provider or diet and nutrition specialist (dietitian) to adjust your eating plan to your individual calorie needs. This information is not intended to replace advice given to you by your health care provider. Make sure you discuss any questions you have with your health care  provider. Document Released: 03/24/2011 Document Revised: 03/28/2016 Document Reviewed: 03/28/2016 Elsevier Interactive Patient Education  2018 [employer].  Hypertension Hypertension, commonly called high blood pressure, is when the force of blood pumping through the arteries is too strong. The arteries are the blood vessels that carry blood from the heart throughout the body. Hypertension forces the heart to work harder to pump blood and may cause arteries to become narrow or stiff. Having untreated or uncontrolled hypertension can cause heart attacks, strokes, kidney disease, and other problems. A blood pressure reading consists of a higher number over a lower number. Ideally, your blood pressure should be below 120/80. The first ("top") number is called the systolic pressure. It is a measure of the pressure in your arteries as your heart beats. The second ("bottom") number is called the diastolic pressure. It is a measure of the pressure in your arteries as the heart relaxes. What are the causes? The cause of this condition is not known. What increases the risk? Some risk factors for high blood pressure are under your control. Others are not. Factors you can change  Smoking.  Having type 2 diabetes mellitus, high cholesterol, or both.  Not getting enough exercise or physical activity.  Being overweight.  Having too much fat, sugar, calories, or salt (sodium) in your diet.  Drinking too much alcohol. Factors that are difficult or impossible to change  Having chronic kidney disease.  Having a family history of high blood pressure.  Age. Risk increases with age.  Race. You may be at higher risk if you are African-American.  Gender. Men are at higher risk than women before age 23. After age 70, women are at higher risk than men.  Having obstructive sleep apnea.  Stress. What are the signs or symptoms? Extremely high blood pressure (hypertensive crisis) may  cause:  Headache.  Anxiety.  Shortness of breath.  Nosebleed.  Nausea and vomiting.  Severe chest pain.  Jerky movements you cannot control (seizures).  How is this diagnosed? This condition is diagnosed by measuring your blood pressure while you are seated, with your arm resting on a surface. The cuff of the blood pressure monitor will be placed directly against the skin of your upper arm at the level of your heart. It should be measured at least twice using the same arm. Certain conditions can cause a difference in blood pressure between your right and left arms. Certain factors can cause blood pressure readings to be lower or higher than normal (elevated) for a short period of time:  When your blood pressure is higher when you are in a health care provider's office than when you are at home, this is called white coat hypertension. Most people with this condition do not need medicines.  When your blood pressure is higher at home than when you are in a health care provider's office, this is called masked hypertension. Most people with this condition may need medicines to control blood pressure.  If you have a high blood pressure reading during one visit or you  have normal blood pressure with other risk factors:  You may be asked to return on a different day to have your blood pressure checked again.  You may be asked to monitor your blood pressure at home for 1 week or longer.  If you are diagnosed with hypertension, you may have other blood or imaging tests to help your health care provider understand your overall risk for other conditions. How is this treated? This condition is treated by making healthy lifestyle changes, such as eating healthy foods, exercising more, and reducing your alcohol intake. Your health care provider may prescribe medicine if lifestyle changes are not enough to get your blood pressure under control, and if:  Your systolic blood pressure is above  130.  Your diastolic blood pressure is above 80.  Your personal target blood pressure may vary depending on your medical conditions, your age, and other factors. Follow these instructions at home: Eating and drinking  Eat a diet that is high in fiber and potassium, and low in sodium, added sugar, and fat. An example eating plan is called the DASH (Dietary Approaches to Stop Hypertension) diet. To eat this way: ? Eat plenty of fresh fruits and vegetables. Try to fill half of your plate at each meal with fruits and vegetables. ? Eat whole grains, such as whole wheat pasta, brown rice, or whole grain bread. Fill about one quarter of your plate with whole grains. ? Eat or drink low-fat dairy products, such as skim milk or low-fat yogurt. ? Avoid fatty cuts of meat, processed or cured meats, and poultry with skin. Fill about one quarter of your plate with lean proteins, such as fish, chicken without skin, beans, eggs, and tofu. ? Avoid premade and processed foods. These tend to be higher in sodium, added sugar, and fat.  Reduce your daily sodium intake. Most people with hypertension should eat less than 1,500 mg of sodium a day.  Limit alcohol intake to no more than 1 drink a day for nonpregnant women and 2 drinks a day for men. One drink equals 12 oz of beer, 5 oz of wine, or 1 oz of hard liquor. Lifestyle  Work with your health care provider to maintain a healthy body weight or to lose weight. Ask what an ideal weight is for you.  Get at least 30 minutes of exercise that causes your heart to beat faster (aerobic exercise) most days of the week. Activities may include walking, swimming, or biking.  Include exercise to strengthen your muscles (resistance exercise), such as pilates or lifting weights, as part of your weekly exercise routine. Try to do these types of exercises for 30 minutes at least 3 days a week.  Do not use any products that contain nicotine or tobacco, such as cigarettes and  e-cigarettes. If you need help quitting, ask your health care provider.  Monitor your blood pressure at home as told by your health care provider.  Keep all follow-up visits as told by your health care provider. This is important. Medicines  Take over-the-counter and prescription medicines only as told by your health care provider. Follow directions carefully. Blood pressure medicines must be taken as prescribed.  Do not skip doses of blood pressure medicine. Doing this puts you at risk for problems and can make the medicine less effective.  Ask your health care provider about side effects or reactions to medicines that you should watch for. Contact a health care provider if:  You think you are having a reaction  to a medicine you are taking.  You have headaches that keep coming back (recurring).  You feel dizzy.  You have swelling in your ankles.  You have trouble with your vision. Get help right away if:  You develop a severe headache or confusion.  You have unusual weakness or numbness.  You feel faint.  You have severe pain in your chest or abdomen.  You vomit repeatedly.  You have trouble breathing. Summary  Hypertension is when the force of blood pumping through your arteries is too strong. If this condition is not controlled, it may put you at risk for serious complications.  Your personal target blood pressure may vary depending on your medical conditions, your age, and other factors. For most people, a normal blood pressure is less than 120/80.  Hypertension is treated with lifestyle changes, medicines, or a combination of both. Lifestyle changes include weight loss, eating a healthy, low-sodium diet, exercising more, and limiting alcohol. This information is not intended to replace advice given to you by your health care provider. Make sure you discuss any questions you have with your health care provider. Document Released: 04/04/2005 Document Revised: 03/02/2016  Document Reviewed: 03/02/2016 Elsevier Interactive Patient Education  [employer].

## 2017-06-05 NOTE — Progress Notes (Signed)
Pre visit review using our clinic review tool, if applicable. No additional management support is needed unless otherwise documented below in the visit note. 

## 2017-06-06 LAB — HEPATITIS B SURFACE ANTIGEN: Hepatitis B Surface Ag: NONREACTIVE

## 2017-06-06 LAB — HEPATITIS B SURFACE ANTIBODY, QUANTITATIVE: Hepatitis B-Post: <5 m[IU]/mL — ABNORMAL LOW (ref >=10)

## 2017-06-08 ENCOUNTER — Encounter: Payer: Self-pay | Admitting: Internal Medicine

## 2017-06-15 DIAGNOSIS — F17210 Nicotine dependence, cigarettes, uncomplicated: Secondary | ICD-10-CM | POA: Diagnosis not present

## 2017-06-15 DIAGNOSIS — R0602 Shortness of breath: Secondary | ICD-10-CM | POA: Diagnosis not present

## 2017-06-15 DIAGNOSIS — I10 Essential (primary) hypertension: Secondary | ICD-10-CM | POA: Diagnosis not present

## 2017-07-13 DIAGNOSIS — R0602 Shortness of breath: Secondary | ICD-10-CM | POA: Diagnosis not present

## 2017-07-13 DIAGNOSIS — R079 Chest pain, unspecified: Secondary | ICD-10-CM | POA: Diagnosis not present

## 2017-07-13 DIAGNOSIS — I10 Essential (primary) hypertension: Secondary | ICD-10-CM | POA: Diagnosis not present

## 2017-07-13 DIAGNOSIS — K210 Gastro-esophageal reflux disease with esophagitis: Secondary | ICD-10-CM | POA: Diagnosis not present

## 2017-08-10 DIAGNOSIS — L723 Sebaceous cyst: Secondary | ICD-10-CM | POA: Diagnosis not present

## 2017-09-19 ENCOUNTER — Other Ambulatory Visit: Payer: Self-pay | Admitting: Internal Medicine

## 2017-09-19 ENCOUNTER — Ambulatory Visit (INDEPENDENT_AMBULATORY_CARE_PROVIDER_SITE_OTHER): Payer: BLUE CROSS/BLUE SHIELD | Admitting: Internal Medicine

## 2017-09-19 ENCOUNTER — Encounter: Payer: Self-pay | Admitting: Internal Medicine

## 2017-09-19 VITALS — BP 148/78 | HR 84 | Temp 98.5°F | Ht 69.0 in | Wt 205.0 lb

## 2017-09-19 DIAGNOSIS — F17200 Nicotine dependence, unspecified, uncomplicated: Secondary | ICD-10-CM | POA: Diagnosis not present

## 2017-09-19 DIAGNOSIS — E785 Hyperlipidemia, unspecified: Secondary | ICD-10-CM | POA: Diagnosis not present

## 2017-09-19 DIAGNOSIS — F329 Major depressive disorder, single episode, unspecified: Secondary | ICD-10-CM

## 2017-09-19 DIAGNOSIS — R748 Abnormal levels of other serum enzymes: Secondary | ICD-10-CM | POA: Diagnosis not present

## 2017-09-19 DIAGNOSIS — R945 Abnormal results of liver function studies: Principal | ICD-10-CM

## 2017-09-19 DIAGNOSIS — Z125 Encounter for screening for malignant neoplasm of prostate: Secondary | ICD-10-CM | POA: Diagnosis not present

## 2017-09-19 DIAGNOSIS — I10 Essential (primary) hypertension: Secondary | ICD-10-CM

## 2017-09-19 DIAGNOSIS — F32A Depression, unspecified: Secondary | ICD-10-CM

## 2017-09-19 DIAGNOSIS — K219 Gastro-esophageal reflux disease without esophagitis: Secondary | ICD-10-CM

## 2017-09-19 DIAGNOSIS — R739 Hyperglycemia, unspecified: Secondary | ICD-10-CM | POA: Diagnosis not present

## 2017-09-19 DIAGNOSIS — F419 Anxiety disorder, unspecified: Secondary | ICD-10-CM | POA: Diagnosis not present

## 2017-09-19 DIAGNOSIS — R7989 Other specified abnormal findings of blood chemistry: Secondary | ICD-10-CM

## 2017-09-19 LAB — COMPREHENSIVE METABOLIC PANEL
ALK PHOS: 83 U/L (ref 39–117)
ALT: 35 U/L (ref 0–53)
AST: 56 U/L — AB (ref 0–37)
Albumin: 4.3 g/dL (ref 3.5–5.2)
BILIRUBIN TOTAL: 1 mg/dL (ref 0.2–1.2)
BUN: 12 mg/dL (ref 6–23)
CO2: 33 meq/L — AB (ref 19–32)
CREATININE: 0.61 mg/dL (ref 0.40–1.50)
Calcium: 9.9 mg/dL (ref 8.4–10.5)
Chloride: 103 mEq/L (ref 96–112)
GFR: 148.67 mL/min (ref >60.00)
GLUCOSE: 104 mg/dL — AB (ref 70–99)
Potassium: 3.2 mEq/L — ABNORMAL LOW (ref 3.5–5.1)
SODIUM: 144 meq/L (ref 135–145)
TOTAL PROTEIN: 6.5 g/dL (ref 6.0–8.3)

## 2017-09-19 LAB — HEMOGLOBIN A1C: Hgb A1c MFr Bld: 5.3 % (ref 4.6–6.5)

## 2017-09-19 LAB — PSA: PSA: 0.93 ng/mL (ref 0.10–4.00)

## 2017-09-19 MED ORDER — LOSARTAN POTASSIUM 50 MG PO TABS
50.0000 mg | ORAL_TABLET | Freq: Every day | ORAL | 1 refills | Status: DC
Start: 2017-09-19 — End: 2018-05-24

## 2017-09-19 MED ORDER — PANTOPRAZOLE SODIUM 20 MG PO TBEC: 20.0000 mg | DELAYED_RELEASE_TABLET | Freq: Every day | ORAL | 3 refills | Status: DC

## 2017-09-19 NOTE — Progress Notes (Signed)
No chief complaint on file.  F/u  1. Abnormal lfts still drinking etoh qd liquor  2. HLD reviewed labs  3. HTN uncontrolled losartan 25 mg qd  4. GERD needs refill of protonix  5. Tobacco abuse still smoking  6. Anxiety and mood improved on zoloft 50 mg qd   Review of Systems  Constitutional: Negative for weight loss.  HENT: Negative for hearing loss.   Eyes: Negative for blurred vision.  Respiratory: Negative for shortness of breath and wheezing.   Cardiovascular: Negative for chest pain.  Gastrointestinal: Negative for abdominal pain.  Musculoskeletal: Negative for falls.  Skin: Negative for rash.  Neurological: Negative for headaches.  Psychiatric/Behavioral: Negative for depression. The patient is not nervous/anxious.    Past Medical History:  Diagnosis Date  . Abnormal EKG   . Allergy    Seasonal  . Anxiety   . Chicken pox   . Depression   . GERD (gastroesophageal reflux disease)   . Hypertension   . Urine incontinence    Past Surgical History:  Procedure Laterality Date  . CHOLECYSTECTOMY  2000  . LEFT HEART CATH AND CORONARY ANGIOGRAPHY N/A 01/31/2017   Procedure: LEFT HEART CATH AND CORONARY ANGIOGRAPHY;  Surgeon: Dionisio David, MD;  Location: [facility];  Service: Cardiovascular;  Laterality: N/A;   Family History  Problem Relation Age of Onset  . Alcohol abuse Father   . Diabetes Father   . Cancer Father        ?stage IV thyroid  . Cancer Maternal Grandfather        Colon Cancer   Social History   Socioeconomic History  . Marital status: Married    Spouse name: Not on file  . Number of children: Not on file  . Years of education: Not on file  . Highest education level: Not on file  Occupational History  . Not on file  Social Needs  . Financial resource strain: Not on file  . Food insecurity:    Worry: Not on file    Inability: Not on file  . Transportation needs:    Medical: Not on file    Non-medical: Not on file  Tobacco Use   . Smoking status: Current Every Day Smoker    Packs/day: 1.00    Types: Cigarettes  . Smokeless tobacco: Never Used  Substance and Sexual Activity  . Alcohol use: Yes    Alcohol/week: 12.6 oz    Types: 21 Shots of liquor per week    Comment: 1 pt will last 1 week   . Drug use: No  . Sexual activity: Yes    Partners: Female  Lifestyle  . Physical activity:    Days per week: Not on file    Minutes per session: Not on file  . Stress: Not on file  Relationships  . Social connections:    Talks on phone: Not on file    Gets together: Not on file    Attends religious service: Not on file    Active member of club or organization: Not on file    Attends meetings of clubs or organizations: Not on file    Relationship status: Not on file  . Intimate partner violence:    Fear of current or ex partner: Not on file    Emotionally abused: Not on file    Physically abused: Not on file    Forced sexual activity: Not on file  Other Topics Concern  . 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   No Known Allergies No results found for this or any previous visit (from the past 2160 hour(s)). Objective  There is no height or weight on file to calculate BMI. Wt Readings from Last 3 Encounters:  06/05/17 206 lb 12.8 oz (93.8 kg)  04/13/17 207 lb 4 oz (94 kg)  02/08/17 205 lb 1.9 oz (93 kg)   Temp Readings from Last 3 Encounters:  06/05/17 98.6 F (37 C) (Oral)  04/13/17 98.3 F (36.8 C) (Oral)  02/08/17 98.6 F (37 C) (Oral)   BP Readings from Last 3 Encounters:  06/05/17 (!) 144/76  04/13/17 118/78  02/08/17 140/85   Pulse Readings from Last 3 Encounters:  06/05/17 75  04/13/17 90  02/08/17 86     Physical Exam  Constitutional: He is oriented to person, place, and time. Vital signs are normal. He appears well-developed and well-nourished. He is cooperative.  HENT:  Head: Normocephalic and atraumatic.  Mouth/Throat: Oropharynx is clear and moist and mucous membranes are normal.  Eyes: Pupils are equal, round, and reactive to light. Conjunctivae are normal.  Cardiovascular: Normal rate, regular rhythm and normal heart sounds.  Pulmonary/Chest: Effort normal and breath sounds normal.  Neurological: He is alert and oriented to person, place, and time. Gait normal.  Skin: Skin is warm, dry and intact.  Psychiatric: He has a normal mood and affect. His speech is normal and behavior is normal. Judgment and thought content normal. Cognition and memory are normal.  Nursing note and vitals reviewed.   Assessment   1. Abnormal lfts  2. HLD  3. HTN  4 gastroesophageal reflux disease h/o CP cath 01/21/17 normal   5. Tobacco abuse  6.anxiety and depression  7. HM Plan  1. Check labs today  rec reduce etoh intake  Consider US liver in future  2. Given cholesterol info  3. Increase losartan to 50 mg qd  Of note given by D.r Laurelyn Sickle he no longer wishes to go here any longer  4. Refilled PPI  5. rec cessation  Hold albuterol inhaler for now  6. zoloft 50 mg qd continue  7.  Labs today  Declines flu shot, pna 23  Tdap UTD   rec smoking cessation pt has cut back and etoh cessation  Disc colonscopy in past pt wants to think about it   Provider: Dr. Olivia Mackie McLean-Scocuzza-[facility]

## 2017-09-19 NOTE — Patient Instructions (Signed)
F/u in 3-4 months sooner if needed   Bronchospasm, Adult Bronchospasm is when airways in the lungs get smaller. When this happens, it can be hard to breathe. You may cough. You may also make a whistling sound when you breathe (wheeze). Follow these instructions at home: Medicines  Take over-the-counter and prescription medicines only as told by your doctor.  If you need to use an inhaler or nebulizer to take your medicine, ask your doctor how to use it.  If you were given a spacer, always use it with your inhaler. Lifestyle  Change your heating and air conditioning filter. Do this at least once a month.  Try not to use fireplaces and wood stoves.  Do not  smoke. Do not  allow smoking in your home.  Try not to use things that have a strong smell, like perfume.  Get rid of pests (such as roaches and mice) and their poop.  Remove any mold from your home.  Keep your house clean. Get rid of dust.  Use cleaning products that have no smell.  Replace carpet with wood, tile, or vinyl flooring.  Use allergy-proof pillows, mattress covers, and box spring covers.  Wash bed sheets and blankets every week. Use hot water. Dry them in a dryer.  Use blankets that are made of polyester or cotton.  Wash your hands often.  Keep pets out of your bedroom.  When you exercise, try not to breathe in cold air. General instructions  Have a plan for getting medical care. Know these things: ? When to call your doctor. ? When to call local emergency services (911 in the [place] ? Where to go in an emergency.  Stay up to date on your shots (immunizations).  When you have an episode: ? Stay calm. ? Relax. ? Breathe slowly. Contact a doctor if:  Your muscles ache.  Your chest hurts.  The color of the mucus you cough up (sputum) changes from clear or white to yellow, green, gray, or bloody.  The mucus you cough up gets thicker.  You have a fever. Get help right away if:  The  whistling sound gets worse, even after you take your medicines.  Your coughing gets worse.  You find it even harder to breathe.  Your chest hurts very much. Summary  Bronchospasm is when airways in the lungs get smaller.  When this happens, it can be hard to breathe. You may cough. You may also make a whistling sound when you breathe.  Stay away from things that cause you to have episodes. These include smoke or dust. This information is not intended to replace advice given to you by your health care provider. Make sure you discuss any questions you have with your health care provider. Document Released: 01/30/2009 Document Revised: 04/07/2016 Document Reviewed: 04/07/2016 Elsevier Interactive Patient Education  2017 Else[employer] Inc.  Cholesterol Cholesterol is a white, waxy, fat-like substance that is needed by the human body in small amounts. The liver makes all the cholesterol we need. Cholesterol is carried from the liver by the blood through the blood vessels. Deposits of cholesterol (plaques) may build up on blood vessel (artery) walls. Plaques make the arteries narrower and stiffer. Cholesterol plaques increase the risk for heart attack and stroke. You cannot feel your cholesterol level even if it is very high. The only way to know that it is high is to have a blood test. Once you know your cholesterol levels, you should keep a record of the test  results. Work with your health care provider to keep your levels in the desired range. What do the results mean?  Total cholesterol is a rough measure of all the cholesterol in your blood.  LDL (low-density lipoprotein) is the "bad" cholesterol. This is the type that causes plaque to build up on the artery walls. You want this level to be low.  HDL (high-density lipoprotein) is the "good" cholesterol because it cleans the arteries and carries the LDL away. You want this level to be high.  Triglycerides are fat that the body can either burn  for energy or store. High levels are closely linked to heart disease. What are the desired levels of cholesterol?  Total cholesterol below 200.  LDL below 100 for people who are at risk, below 70 for people at very high risk.  HDL above 40 is good. A level of 60 or higher is considered to be protective against heart disease.  Triglycerides below 150. How can I lower my cholesterol? Diet Follow your diet program as told by your health care provider.  Choose fish or white meat chicken and [place], roasted or baked. Limit fatty cuts of red meat, fried foods, and processed meats, such as sausage and lunch meats.  Eat lots of fresh fruits and vegetables.  Choose whole grains, beans, pasta, potatoes, and cereals.  Choose olive oil, corn oil, or canola oil, and use only small amounts.  Avoid butter, mayonnaise, shortening, or palm kernel oils.  Avoid foods with trans fats.  Drink skim or nonfat milk and eat low-fat or nonfat yogurt and cheeses. Avoid whole milk, cream, ice cream, egg yolks, and full-fat cheeses.  Healthier desserts include angel food cake, ginger snaps, animal crackers, hard candy, popsicles, and low-fat or nonfat frozen yogurt. Avoid pastries, cakes, pies, and cookies.  Exercise  Follow your exercise program as told by your health care provider. A regular program: ? Helps to decrease LDL and raise HDL. ? Helps with weight control.  Do things that increase your activity level, such as gardening, walking, and taking the stairs.  Ask your health care provider about ways that you can be more active in your daily life.  Medicine  Take over-the-counter and prescription medicines only as told by your health care provider. ? Medicine may be prescribed by your health care provider to help lower cholesterol and decrease the risk for heart disease. This is usually done if diet and exercise have failed to bring down cholesterol levels. ? If you have several risk factors, you  may need medicine even if your levels are normal.  This information is not intended to replace advice given to you by your health care provider. Make sure you discuss any questions you have with your health care provider. Document Released: 12/28/2000 Document Revised: 10/31/2015 Document Reviewed: 10/03/2015 Elsevier Interactive Patient Education  [employer].

## 2017-09-19 NOTE — Progress Notes (Signed)
Pre visit review using our clinic review tool, if applicable. No additional management support is needed unless otherwise documented below in the visit note. 

## 2017-09-20 LAB — HEPATITIS C ANTIBODY
Hepatitis C Ab: NONREACTIVE
SIGNAL TO CUT-OFF: 0.22 (ref <1.00)

## 2017-09-20 LAB — HEPATITIS A ANTIBODY, IGM: Hep A IgM: NONREACTIVE

## 2017-09-20 LAB — HEPATITIS A ANTIBODY, TOTAL: HEPATITIS A AB,TOTAL: NONREACTIVE

## 2017-09-27 ENCOUNTER — Ambulatory Visit
Admission: RE | Admit: 2017-09-27 | Discharge: 2017-09-27 | Disposition: A | Discharge status: Home / Self Care | Payer: BLUE CROSS/BLUE SHIELD | Source: Ambulatory Visit | Attending: Internal Medicine | Admitting: Internal Medicine

## 2017-09-27 ENCOUNTER — Encounter: Payer: Self-pay | Admitting: Internal Medicine

## 2017-09-27 DIAGNOSIS — N281 Cyst of kidney, acquired: Secondary | ICD-10-CM | POA: Insufficient documentation

## 2017-09-27 DIAGNOSIS — R7989 Other specified abnormal findings of blood chemistry: Secondary | ICD-10-CM

## 2017-09-27 DIAGNOSIS — K760 Fatty (change of) liver, not elsewhere classified: Secondary | ICD-10-CM | POA: Diagnosis not present

## 2017-09-27 DIAGNOSIS — R945 Abnormal results of liver function studies: Secondary | ICD-10-CM | POA: Diagnosis not present

## 2017-12-05 ENCOUNTER — Encounter: Payer: Self-pay | Admitting: Internal Medicine

## 2017-12-10 DIAGNOSIS — L723 Sebaceous cyst: Secondary | ICD-10-CM | POA: Diagnosis not present

## 2017-12-10 DIAGNOSIS — L089 Local infection of the skin and subcutaneous tissue, unspecified: Secondary | ICD-10-CM | POA: Diagnosis not present

## 2018-01-05 DIAGNOSIS — M7662 Achilles tendinitis, left leg: Secondary | ICD-10-CM | POA: Diagnosis not present

## 2018-01-05 DIAGNOSIS — M7732 Calcaneal spur, left foot: Secondary | ICD-10-CM | POA: Diagnosis not present

## 2018-01-19 ENCOUNTER — Ambulatory Visit (INDEPENDENT_AMBULATORY_CARE_PROVIDER_SITE_OTHER): Payer: BLUE CROSS/BLUE SHIELD | Admitting: Internal Medicine

## 2018-01-19 ENCOUNTER — Encounter: Payer: Self-pay | Admitting: Internal Medicine

## 2018-01-19 VITALS — BP 136/84 | HR 82 | Temp 98.7°F | Ht 69.0 in | Wt 199.8 lb

## 2018-01-19 DIAGNOSIS — K760 Fatty (change of) liver, not elsewhere classified: Secondary | ICD-10-CM | POA: Diagnosis not present

## 2018-01-19 DIAGNOSIS — F321 Major depressive disorder, single episode, moderate: Secondary | ICD-10-CM

## 2018-01-19 DIAGNOSIS — E785 Hyperlipidemia, unspecified: Secondary | ICD-10-CM

## 2018-01-19 DIAGNOSIS — F17200 Nicotine dependence, unspecified, uncomplicated: Secondary | ICD-10-CM

## 2018-01-19 DIAGNOSIS — M84375A Stress fracture, left foot, initial encounter for fracture: Secondary | ICD-10-CM | POA: Insufficient documentation

## 2018-01-19 DIAGNOSIS — F32A Depression, unspecified: Secondary | ICD-10-CM

## 2018-01-19 DIAGNOSIS — E559 Vitamin D deficiency, unspecified: Secondary | ICD-10-CM

## 2018-01-19 DIAGNOSIS — F329 Major depressive disorder, single episode, unspecified: Secondary | ICD-10-CM

## 2018-01-19 DIAGNOSIS — J449 Chronic obstructive pulmonary disease, unspecified: Secondary | ICD-10-CM

## 2018-01-19 DIAGNOSIS — F419 Anxiety disorder, unspecified: Secondary | ICD-10-CM | POA: Diagnosis not present

## 2018-01-19 DIAGNOSIS — I10 Essential (primary) hypertension: Secondary | ICD-10-CM

## 2018-01-19 MED ORDER — BUDESONIDE-FORMOTEROL FUMARATE 160-4.5 MCG/ACT IN AERO: 2.0000 | INHALATION_SPRAY | Freq: Two times a day (BID) | RESPIRATORY_TRACT | 12 refills | Status: DC

## 2018-01-19 MED ORDER — LORAZEPAM 0.5 MG PO TABS: 0.5000 mg | ORAL_TABLET | Freq: Every day | ORAL | 2 refills | Status: DC | PRN

## 2018-01-19 MED ORDER — SERTRALINE HCL 50 MG PO TABS: 75.0000 mg | ORAL_TABLET | Freq: Every day | ORAL | 5 refills | Status: DC

## 2018-01-19 NOTE — Patient Instructions (Addendum)
Try Symbicort and let me know and let me know about colonoscopy  Think about hep A/B vaccine   Nonalcoholic Fatty Liver Disease Diet Nonalcoholic fatty liver disease is a condition that causes fat to accumulate in and around the liver. The disease makes it harder for the liver to work the way that it should. Following a healthy diet can help to keep nonalcoholic fatty liver disease under control. It can also help to prevent or improve conditions that are associated with the disease, such as heart disease, diabetes, high blood pressure, and abnormal cholesterol levels. Along with regular exercise, this diet:  Promotes weight loss.  Helps to control blood sugar levels.  Helps to improve the way that the body uses insulin.  What do I need to know about this diet?  Use the glycemic index (GI) to plan your meals. The index tells you how quickly a food will raise your blood sugar. Choose low-GI foods. These foods take a longer time to raise blood sugar.  Keep track of how many calories you take in. Eating the right amount of calories will help you to achieve a healthy weight.  You may want to follow a Mediterranean diet. This diet includes a lot of vegetables, lean meats or fish, whole grains, fruits, and healthy oils and fats. What foods can I eat? Grains Whole grains, such as whole-wheat or whole-grain breads, crackers, tortillas, cereals, and pasta. Stone-ground whole wheat. Pumpernickel bread. Unsweetened oatmeal. Bulgur. Barley. Quinoa. Brown or wild rice. Corn or whole-wheat flour tortillas. Vegetables Lettuce. Spinach. Peas. Beets. Cauliflower. Cabbage. Broccoli. Carrots. Tomatoes. Squash. Eggplant. Herbs. Peppers. Onions. Cucumbers. Brussels sprouts. Yams and sweet potatoes. Beans. Lentils. Fruits Bananas. Apples. Oranges. Grapes. Papaya. Mango. Pomegranate. Kiwi. Grapefruit. Cherries. Meats and Other Protein Sources Seafood and shellfish. Lean meats. Poultry. Tofu. Dairy Low-fat or  fat-free dairy products, such as yogurt, cottage cheese, and cheese. Beverages Water. Sugar-free drinks. Tea. Coffee. Low-fat or skim milk. Milk alternatives, such as soy or almond milk. Real fruit juice. Condiments Mustard. Relish. Low-fat, low-sugar ketchup and barbecue sauce. Low-fat or fat-free mayonnaise. Sweets and Desserts Sugar-free sweets. Fats and Oils Avocado. Canola or olive oil. Nuts and nut butters. Seeds. The items listed above may not be a complete list of recommended foods or beverages. Contact your dietitian for more options. What foods are not recommended? Palm oil and coconut oil. Processed foods. Fried foods. Sweetened drinks, such as sweet tea, milkshakes, snow cones, iced sweet drinks, and sodas. Alcohol. Sweets. Foods that contain a lot of salt or sodium. The items listed above may not be a complete list of foods and beverages to avoid. Contact your dietitian for more information. This information is not intended to replace advice given to you by your health care provider. Make sure you discuss any questions you have with your health care provider. Document Released: 08/19/2014 Document Revised: 09/10/2015 Document Reviewed: 04/29/2014 Elsevier Interactive Patient Education  2018 [employer].  Fatty Liver Fatty liver, also called hepatic steatosis or steatohepatitis, is a condition in which too much fat has built up in your liver cells. The liver removes harmful substances from your bloodstream. It produces fluids your body needs. It also helps your body use and store energy from the food you eat. In many cases, fatty liver does not cause symptoms or problems. It is often diagnosed when tests are being done for other reasons. However, over time, fatty liver can cause inflammation that may lead to more serious liver problems, such as scarring of  the liver (cirrhosis). What are the causes? Causes of fatty liver may include:  Drinking too much alcohol.  Poor  nutrition.  Obesity.  Cushing syndrome.  Diabetes.  Hyperlipidemia.  Pregnancy.  Certain drugs.  Poisons.  Some viral infections.  What increases the risk? You may be more likely to develop fatty liver if you:  Abuse alcohol.  Are pregnant.  Are overweight.  Have diabetes.  Have hepatitis.  Have a high triglyceride level.  What are the signs or symptoms? Fatty liver often does not cause any symptoms. In cases where symptoms develop, they can include:  Fatigue.  Weakness.  Weight loss.  Confusion.  Abdominal pain.  Yellowing of your skin and the white parts of your eyes (jaundice).  Nausea and vomiting.  How is this diagnosed? Fatty liver may be diagnosed by:  Physical exam and medical history.  Blood tests.  Imaging tests, such as an ultrasound, CT scan, or MRI.  Liver biopsy. A small sample of liver tissue is removed using a needle. The sample is then looked at under a microscope.  How is this treated? Fatty liver is often caused by other health conditions. Treatment for fatty liver may involve medicines and lifestyle changes to manage conditions such as:  Alcoholism.  High cholesterol.  Diabetes.  Being overweight or obese.  Follow these instructions at home:  Eat a healthy diet as directed by your health care provider.  Exercise regularly. This can help you lose weight and control your cholesterol and diabetes. Talk to your health care provider about an exercise plan and which activities are best for you.  Do not drink alcohol.  Take medicines only as directed by your health care provider. Contact a health care provider if: You have difficulty controlling your:  Blood sugar.  Cholesterol.  Alcohol consumption.  Get help right away if:  You have abdominal pain.  You have jaundice.  You have nausea and vomiting. This information is not intended to replace advice given to you by your health care provider. Make sure you  discuss any questions you have with your health care provider. Document Released: 05/20/2005 Document Revised: 09/10/2015 Document Reviewed: 08/14/2013 Elsevier Interactive Patient Education  2018 [employer].  3 shots over 6 months  Hepatitis A; Hepatitis B Vaccine injection What is this medicine? HEPATITIS A VACCINE; HEPATITIS B VACCINE (hep uh TAHY tis A vak SEEN; hep uh TAHY tis B vak SEEN) is a vaccine to protect from an infection with the hepatitis A and B virus. This vaccine does not contain the live viruses. It will not cause a hepatitis infection. This medicine may be used for other purposes; ask your health care provider or pharmacist if you have questions. COMMON BRAND NAME(S): Twinrix What should I tell my health care provider before I take this medicine? They need to know if you have any of these conditions: -bleeding disorder -fever or infection -heart disease -immune system problems -an unusual or allergic reaction to hepatitis A or B vaccine, neomycin, yeast, thimerosal, other medicines, foods, dyes, or preservatives -pregnant or trying to get pregnant -breast-feeding How should I use this medicine? This vaccine is for injection into a muscle. It is given by a health care professional. A copy of Vaccine Information Statements will be given before each vaccination. Read this sheet carefully each time. The sheet may change frequently. Talk to your pediatrician regarding the use of this medicine in children. Special care may be needed. Overdosage: If you think you have taken too  much of this medicine contact a poison control center or emergency room at once. NOTE: This medicine is only for you. Do not share this medicine with others. What if I miss a dose? It is important not to miss your dose. Call your doctor or health care professional if you are unable to keep an appointment. What may interact with this medicine? -medicines that suppress your immune function like  adalimumab, anakinra, infliximab -medicines to treat cancer -steroid medicines like prednisone or cortisone This list may not describe all possible interactions. Give your health care provider a list of all the medicines, herbs, non-prescription drugs, or dietary supplements you use. Also tell them if you smoke, drink alcohol, or use illegal drugs. Some items may interact with your medicine. What should I watch for while using this medicine? See your health care provider for all shots of this vaccine as directed. You must have 3 to 4 shots of this vaccine for protection from hepatitis A and B infection. Tell your doctor right away if you have any serious or unusual side effects after getting this vaccine. What side effects may I notice from receiving this medicine? Side effects that you should report to your doctor or health care professional as soon as possible: -allergic reactions like skin rash, itching or hives, swelling of the face, lips, or tongue -breathing problems -confused, irritated -fast, irregular heartbeat -flu-like syndrome -numb, tingling pain -seizures Side effects that usually do not require medical attention (report to your doctor or health care professional if they continue or are bothersome): -diarrhea -fever -headache -loss of appetite -muscle pain -nausea -pain, redness, swelling, or irritation at site where injected -tiredness This list may not describe all possible side effects. Call your doctor for medical advice about side effects. You may report side effects to FDA at 1-800-FDA-1088. Where should I keep my medicine? This drug is given in a hospital or clinic and will not be stored at home. NOTE: This sheet is a summary. It may not cover all possible information. If you have questions about this medicine, talk to your doctor, pharmacist, or health care provider.  2018 Elsevier/Gold Standard (2007-08-17 15:21:37)

## 2018-01-19 NOTE — Progress Notes (Signed)
Pre visit review using our clinic review tool, if applicable. No additional management support is needed unless otherwise documented below in the visit note. 

## 2018-01-19 NOTE — Progress Notes (Signed)
Chief Complaint  Patient presents with  . Follow-up   F/u wife here with him today 1. HTN controlled losartan 50 mg qd  2. Fatty liver disc need for hep A/B vaccine today  3. Tobacco abuse was down to <1ppd now 1.5 ppd c/o wheezing at night tried wifes symbicort and helped  4. Left foot stress fracture Xray 01/05/18 heel spur with ?fracture sch with Dr. Cleda Mccreedy 01/05/18 and 01/26/18 will have repeat foot Xray  5. Anxiety and depression PHQ 9 score 14 and GAD 7 score 14 today on zoloft 50 mg qd prn Ativan dad sick hospice and stressed due to #4 and out of work    Review of Systems  Constitutional: Negative for weight loss.  HENT: Negative for hearing loss.   Eyes: Negative for blurred vision.  Respiratory: Negative for shortness of breath.   Cardiovascular: Negative for chest pain.  Gastrointestinal: Negative for abdominal pain.  Musculoskeletal: Positive for joint pain.  Skin: Negative for rash.  Neurological: Negative for headaches.  Psychiatric/Behavioral: Positive for depression. The patient is nervous/anxious.    Past Medical History:  Diagnosis Date  . Abnormal EKG   . Allergy    Seasonal  . Anxiety   . Chicken pox   . Depression   . GERD (gastroesophageal reflux disease)   . Hyperlipidemia   . Hypertension   . Urine incontinence    Past Surgical History:  Procedure Laterality Date  . CHOLECYSTECTOMY  2000  . LEFT HEART CATH AND CORONARY ANGIOGRAPHY N/A 01/31/2017   Procedure: LEFT HEART CATH AND CORONARY ANGIOGRAPHY;  Surgeon: Dionisio David, MD;  Location: [facility];  Service: Cardiovascular;  Laterality: N/A;   Family History  Problem Relation Age of Onset  . Alcohol abuse Father   . Diabetes Father   . Cancer Father        ?stage IV thyroid  . Cancer Maternal Grandfather        Colon Cancer   Social History   Socioeconomic History  . Marital status: Married    Spouse name: Not on file  . Number of children: Not on file  . Years of education:  Not on file  . Highest education level: Not on file  Occupational History  . Not on file  Social Needs  . Financial resource strain: Not on file  . Food insecurity:    Worry: Not on file    Inability: Not on file  . Transportation needs:    Medical: Not on file    Non-medical: Not on file  Tobacco Use  . Smoking status: Current Every Day Smoker    Packs/day: 1.00    Types: Cigarettes  . Smokeless tobacco: Never Used  Substance and Sexual Activity  . Alcohol use: Yes    Alcohol/week: 21.0 standard drinks    Types: 21 Shots of liquor per week    Comment: 1 pt will last 1 week   . Drug use: No  . Sexual activity: Yes    Partners: Female  Lifestyle  . Physical activity:    Days per week: Not on file    Minutes per session: Not on file  . Stress: Not on file  Relationships  . Social connections:    Talks on phone: Not on file    Gets together: Not on file    Attends religious service: Not on file    Active member of club or organization: Not on file    Attends meetings of clubs or  organizations: Not on file    Relationship status: Not on file  . Intimate partner violence:    Fear of current or ex partner: Not on file    Emotionally abused: Not on file    Physically abused: Not on file    Forced sexual activity: Not on file  Other Topics Concern  . 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 aspirin 81 MG chewable tablet Chew by mouth.  Marland Kitchen LORazepam (ATIVAN) 0.5 MG tablet Take 1 tablet (0.5 mg total) by mouth daily as needed for anxiety.  Marland Kitchen losartan (COZAAR) 50 MG tablet Take 1 tablet (50 mg total) by mouth daily.  . Naproxen Sodium (ALEVE) 220 MG CAPS Take 1 capsule by mouth daily.  . Omega-3 Fatty Acids (FISH OIL) 1000 MG CAPS Take by mouth.  Marland Kitchen OVER THE COUNTER MEDICATION Take 1 capsule by  mouth daily.  . pantoprazole (PROTONIX) 20 MG tablet Take 1 tablet (20 mg total) by mouth daily. 30 minutes before food  . sertraline (ZOLOFT) 50 MG tablet Take 1 tablet (50 mg total) by mouth daily. In am  . vitamin B-12 (CYANOCOBALAMIN) 1000 MCG tablet Take 1,000 mcg by mouth daily.    No Known Allergies No results found for this or any previous visit (from the past 2160 hour(s)). Objective  Body mass index is 29.51 kg/m. Wt Readings from Last 3 Encounters:  01/19/18 199 lb 12.8 oz (90.6 kg)  09/19/17 205 lb (93 kg)  06/05/17 206 lb 12.8 oz (93.8 kg)   Temp Readings from Last 3 Encounters:  01/19/18 98.7 F (37.1 C) (Oral)  09/19/17 98.5 F (36.9 C) (Oral)  06/05/17 98.6 F (37 C) (Oral)   BP Readings from Last 3 Encounters:  01/19/18 136/84  09/19/17 (!) 148/78  06/05/17 (!) 144/76   Pulse Readings from Last 3 Encounters:  01/19/18 82  09/19/17 84  06/05/17 75    Physical Exam  Constitutional: He is oriented to person, place, and time. Vital signs are normal. He appears well-developed and well-nourished. He is cooperative.  HENT:  Head: Normocephalic and atraumatic.  Mouth/Throat: Oropharynx is clear and moist and mucous membranes are normal.  Eyes: Pupils are equal, round, and reactive to light. Conjunctivae are normal.  Cardiovascular: Normal rate, regular rhythm and normal heart sounds.  Pulmonary/Chest: Effort normal and breath sounds normal.  Neurological: He is alert and oriented to person, place, and time. Gait normal.  Skin: Skin is warm, dry and intact.  Psychiatric: He has a normal mood and affect. His speech is normal and behavior is normal. Judgment and thought content normal. Cognition and memory are normal.  Nursing note and vitals reviewed.   Assessment   1. HTN  2. Fatty liver  3 tobacco abuse likely COPD  4. Left foot stress fracture  5. Anxiety and depression GAD 7 and PH9 score 14  6. HM Plan   1. Cont meds  Check fasting labs  2. rec  hep AB vaccine  3. rec smoking cessation  Trial of symbicort 2 puffs bid rinse mouth  4. F/u podiatry  5. Increase zoloft 75 mg qd and prn ativan  6.  Declines flu shot, pna 23  Tdap UTD  rec hep A/B vaccine   rec smoking cessation  pt has cut back and etoh cessation  Disc colonscopy in past pt wants to think about it  PSA normal 09/19/17    Provider: Dr. Olivia Mackie McLean-Scocuzza-[facility]

## 2018-01-26 DIAGNOSIS — G8929 Other chronic pain: Secondary | ICD-10-CM | POA: Diagnosis not present

## 2018-01-26 DIAGNOSIS — M7732 Calcaneal spur, left foot: Secondary | ICD-10-CM | POA: Diagnosis not present

## 2018-01-26 DIAGNOSIS — M7662 Achilles tendinitis, left leg: Secondary | ICD-10-CM | POA: Diagnosis not present

## 2018-01-26 DIAGNOSIS — M79672 Pain in left foot: Secondary | ICD-10-CM | POA: Diagnosis not present

## 2018-03-01 ENCOUNTER — Other Ambulatory Visit: Payer: Self-pay

## 2018-03-01 ENCOUNTER — Emergency Department: Payer: BLUE CROSS/BLUE SHIELD

## 2018-03-01 ENCOUNTER — Inpatient Hospital Stay
Admission: EM | Admit: 2018-03-01 | Discharge: 2018-03-03 | DRG: 871 | Disposition: A | Discharge status: Home / Self Care | Payer: BLUE CROSS/BLUE SHIELD | Attending: Internal Medicine | Admitting: Internal Medicine

## 2018-03-01 DIAGNOSIS — E785 Hyperlipidemia, unspecified: Secondary | ICD-10-CM | POA: Diagnosis not present

## 2018-03-01 DIAGNOSIS — D3502 Benign neoplasm of left adrenal gland: Secondary | ICD-10-CM | POA: Diagnosis present

## 2018-03-01 DIAGNOSIS — Z833 Family history of diabetes mellitus: Secondary | ICD-10-CM

## 2018-03-01 DIAGNOSIS — F418 Other specified anxiety disorders: Secondary | ICD-10-CM | POA: Diagnosis present

## 2018-03-01 DIAGNOSIS — Z716 Tobacco abuse counseling: Secondary | ICD-10-CM | POA: Diagnosis not present

## 2018-03-01 DIAGNOSIS — K859 Acute pancreatitis without necrosis or infection, unspecified: Secondary | ICD-10-CM | POA: Diagnosis present

## 2018-03-01 DIAGNOSIS — K852 Alcohol induced acute pancreatitis without necrosis or infection: Secondary | ICD-10-CM | POA: Diagnosis present

## 2018-03-01 DIAGNOSIS — R197 Diarrhea, unspecified: Secondary | ICD-10-CM | POA: Diagnosis not present

## 2018-03-01 DIAGNOSIS — F17210 Nicotine dependence, cigarettes, uncomplicated: Secondary | ICD-10-CM | POA: Diagnosis present

## 2018-03-01 DIAGNOSIS — Z791 Long term (current) use of non-steroidal anti-inflammatories (NSAID): Secondary | ICD-10-CM

## 2018-03-01 DIAGNOSIS — J441 Chronic obstructive pulmonary disease with (acute) exacerbation: Secondary | ICD-10-CM

## 2018-03-01 DIAGNOSIS — Z800 Family history of malignant neoplasm of digestive organs: Secondary | ICD-10-CM | POA: Diagnosis not present

## 2018-03-01 DIAGNOSIS — Z811 Family history of alcohol abuse and dependence: Secondary | ICD-10-CM

## 2018-03-01 DIAGNOSIS — Z7982 Long term (current) use of aspirin: Secondary | ICD-10-CM

## 2018-03-01 DIAGNOSIS — I10 Essential (primary) hypertension: Secondary | ICD-10-CM | POA: Diagnosis not present

## 2018-03-01 DIAGNOSIS — K219 Gastro-esophageal reflux disease without esophagitis: Secondary | ICD-10-CM | POA: Diagnosis not present

## 2018-03-01 DIAGNOSIS — A419 Sepsis, unspecified organism: Principal | ICD-10-CM | POA: Diagnosis present

## 2018-03-01 DIAGNOSIS — R1084 Generalized abdominal pain: Secondary | ICD-10-CM

## 2018-03-01 DIAGNOSIS — E876 Hypokalemia: Secondary | ICD-10-CM | POA: Diagnosis not present

## 2018-03-01 DIAGNOSIS — R112 Nausea with vomiting, unspecified: Secondary | ICD-10-CM

## 2018-03-01 DIAGNOSIS — Z9049 Acquired absence of other specified parts of digestive tract: Secondary | ICD-10-CM | POA: Diagnosis not present

## 2018-03-01 DIAGNOSIS — R0902 Hypoxemia: Secondary | ICD-10-CM | POA: Diagnosis present

## 2018-03-01 DIAGNOSIS — R000 Tachycardia, unspecified: Secondary | ICD-10-CM | POA: Diagnosis not present

## 2018-03-01 DIAGNOSIS — F1010 Alcohol abuse, uncomplicated: Secondary | ICD-10-CM | POA: Diagnosis not present

## 2018-03-01 LAB — COMPREHENSIVE METABOLIC PANEL
ALBUMIN: 4.4 g/dL (ref 3.5–5.0)
ALT: 50 U/L — ABNORMAL HIGH (ref 0–44)
AST: 75 U/L — AB (ref 15–41)
Alkaline Phosphatase: 142 U/L — ABNORMAL HIGH (ref 38–126)
Anion gap: 12 (ref 5–15)
BUN: 6 mg/dL (ref 6–20)
CALCIUM: 10.4 mg/dL — AB (ref 8.9–10.3)
CHLORIDE: 89 mmol/L — AB (ref 98–111)
CO2: 37 mmol/L — ABNORMAL HIGH (ref 22–32)
CREATININE: 0.68 mg/dL (ref 0.61–1.24)
GFR calc Af Amer: >60 mL/min (ref >60)
Glucose, Bld: 131 mg/dL — ABNORMAL HIGH (ref 70–99)
POTASSIUM: 2.1 mmol/L — AB (ref 3.5–5.1)
Sodium: 138 mmol/L (ref 135–145)
TOTAL PROTEIN: 7.6 g/dL (ref 6.5–8.1)
Total Bilirubin: 1.1 mg/dL (ref 0.3–1.2)

## 2018-03-01 LAB — CBC WITH DIFFERENTIAL/PLATELET
Abs Immature Granulocytes: 0.06 10*3/uL (ref 0.00–0.07)
BASOS ABS: 0.1 10*3/uL (ref 0.0–0.1)
BASOS PCT: 1 %
EOS ABS: 0.2 10*3/uL (ref 0.0–0.5)
EOS PCT: 1 %
HEMATOCRIT: 46.5 % (ref 39.0–52.0)
Hemoglobin: 16.5 g/dL (ref 13.0–17.0)
IMMATURE GRANULOCYTES: 1 %
Lymphocytes Relative: 18 %
Lymphs Abs: 2.2 10*3/uL (ref 0.7–4.0)
MCH: 37.8 pg — ABNORMAL HIGH (ref 26.0–34.0)
MCHC: 35.5 g/dL (ref 30.0–36.0)
MCV: 106.4 fL — AB (ref 80.0–100.0)
MONOS PCT: 9 %
Monocytes Absolute: 1.1 10*3/uL — ABNORMAL HIGH (ref 0.1–1.0)
NEUTROS PCT: 70 %
NRBC: 0 % (ref 0.0–0.2)
Neutro Abs: 9 10*3/uL — ABNORMAL HIGH (ref 1.7–7.7)
PLATELETS: 190 10*3/uL (ref 150–400)
RBC: 4.37 MIL/uL (ref 4.22–5.81)
RDW: 12.7 % (ref 11.5–15.5)
WBC: 12.6 10*3/uL — ABNORMAL HIGH (ref 4.0–10.5)

## 2018-03-01 LAB — LIPASE, BLOOD: Lipase: 127 U/L — ABNORMAL HIGH (ref 11–51)

## 2018-03-01 LAB — TROPONIN I

## 2018-03-01 LAB — CG4 I-STAT (LACTIC ACID): Lactic Acid, Venous: 2.16 mmol/L (ref 0.5–1.9)

## 2018-03-01 LAB — PROTIME-INR
INR: 0.93
Prothrombin Time: 12.4 seconds (ref 11.4–15.2)

## 2018-03-01 LAB — LACTIC ACID, PLASMA: Lactic Acid, Venous: 2.1 mmol/L (ref 0.5–1.9)

## 2018-03-01 MED ORDER — IOPAMIDOL (ISOVUE-300) INJECTION 61%
100.0000 mL | Freq: Once | INTRAVENOUS | Status: AC | PRN
  Administered 2018-03-01: 100 mL via INTRAVENOUS

## 2018-03-01 MED ORDER — IPRATROPIUM-ALBUTEROL 0.5-2.5 (3) MG/3ML IN SOLN
9.0000 mL | Freq: Once | RESPIRATORY_TRACT | Status: AC
  Administered 2018-03-01: 9 mL via RESPIRATORY_TRACT
  Filled 2018-03-01: qty 9

## 2018-03-01 MED ORDER — METHYLPREDNISOLONE SODIUM SUCC 125 MG IJ SOLR
125.0000 mg | Freq: Once | INTRAMUSCULAR | Status: AC
  Administered 2018-03-01: 125 mg via INTRAVENOUS
  Filled 2018-03-01: qty 2

## 2018-03-01 MED ORDER — MORPHINE SULFATE (PF) 4 MG/ML IV SOLN
4.0000 mg | Freq: Once | INTRAVENOUS | Status: AC
  Administered 2018-03-01: 4 mg via INTRAVENOUS

## 2018-03-01 MED ORDER — POTASSIUM CHLORIDE 10 MEQ/100ML IV SOLN
10.0000 meq | INTRAVENOUS | Status: AC
  Administered 2018-03-02 (×4): 10 meq via INTRAVENOUS
  Filled 2018-03-01 (×4): qty 100

## 2018-03-01 MED ORDER — SODIUM CHLORIDE 0.9 % IV SOLN
2.0000 g | Freq: Once | INTRAVENOUS | Status: AC
  Administered 2018-03-01: 2 g via INTRAVENOUS
  Filled 2018-03-01: qty 2

## 2018-03-01 MED ORDER — SODIUM CHLORIDE 0.9 % IV BOLUS
1000.0000 mL | Freq: Once | INTRAVENOUS | Status: AC
  Administered 2018-03-01: 1000 mL via INTRAVENOUS

## 2018-03-01 MED ORDER — METRONIDAZOLE IN NACL 5-0.79 MG/ML-% IV SOLN
500.0000 mg | Freq: Three times a day (TID) | INTRAVENOUS | Status: DC
  Administered 2018-03-02 – 2018-03-03 (×3): 500 mg via INTRAVENOUS
  Filled 2018-03-01 (×7): qty 100

## 2018-03-01 MED ORDER — METRONIDAZOLE IN NACL 5-0.79 MG/ML-% IV SOLN
INTRAVENOUS | Status: AC
  Administered 2018-03-02: 500 mg via INTRAVENOUS
  Filled 2018-03-01: qty 100

## 2018-03-01 MED ORDER — MORPHINE SULFATE (PF) 4 MG/ML IV SOLN
INTRAVENOUS | Status: AC
  Administered 2018-03-01: 4 mg via INTRAVENOUS
  Filled 2018-03-01: qty 1

## 2018-03-01 MED ORDER — SODIUM CHLORIDE 0.9 % IV SOLN
2.0000 g | Freq: Three times a day (TID) | INTRAVENOUS | Status: DC
  Administered 2018-03-02 – 2018-03-03 (×3): 2 g via INTRAVENOUS
  Filled 2018-03-01 (×6): qty 2

## 2018-03-01 MED ORDER — ONDANSETRON HCL 4 MG/2ML IJ SOLN
INTRAMUSCULAR | Status: AC
  Administered 2018-03-01: 4 mg via INTRAVENOUS
  Filled 2018-03-01: qty 2

## 2018-03-01 MED ORDER — ONDANSETRON HCL 4 MG/2ML IJ SOLN
4.0000 mg | Freq: Once | INTRAMUSCULAR | Status: AC
  Administered 2018-03-01: 4 mg via INTRAVENOUS

## 2018-03-01 NOTE — ED Triage Notes (Signed)
Patient c/o lower abdominal pain, N/V/D. Patient reports small, white, liquid BMs. Patient's oxygen saturation 88% on RA in triage. Patient denies hx of COPD, CHF, asthma. Patient placed on 2L [place].

## 2018-03-01 NOTE — Progress Notes (Signed)
CODE SEPSIS - PHARMACY COMMUNICATION  **Broad Spectrum Antibiotics should be administered within 1 hour of Sepsis diagnosis**  Time Code Sepsis Called/Page Received: 1114 2328  Antibiotics Ordered: 9030 1499  Time of 1st antibiotic administration: 1114 2334  Additional action taken by pharmacy:   If necessary, Name of Provider/Nurse Contacted:     Eloise Harman ,PharmD Clinical Pharmacist  03/01/2018  11:49 PM

## 2018-03-01 NOTE — ED Provider Notes (Signed)
[facility] Emergency Department Provider Note  ___________________________________________   First MD Initiated Contact with Patient 03/01/18 2159     (approximate)  I have reviewed the triage vital signs and the nursing notes.   HISTORY  Chief Complaint Abdominal Pain   HPI Darrell Santiago is a 52 y.o. male with a history of hypertension, pancreatitis as well as alcoholism was presented emergency department with 1 week of abdominal pain, nausea and vomiting.  He says that he is also having diarrhea several times a day.  Last antibiotic use several months ago.  Does not report any blood in his stool nor his vomitus.  Says that his pain to his abdomen is more in the upper abdomen with mild radiation to the back.  Patient has had a cholecystectomy.  No known sick contacts.  Also says that he has started wheezing over the past 3 to 4 days.  Denies fever.  Says that he smokes slightly less than 1 pack of cigarettes per day.  Says that his diarrhea is mucus-like.  Past Medical History:  Diagnosis Date  . Abnormal EKG   . Allergy    Seasonal  . Anxiety   . Chicken pox   . Depression   . GERD (gastroesophageal reflux disease)   . Hyperlipidemia   . Hypertension   . Urine incontinence     Patient Active Problem List   Diagnosis Date Noted  . Fatty liver 01/19/2018  . Stress fracture of left foot 01/19/2018  . Elevated liver enzymes 09/19/2017  . Hyperlipidemia 09/19/2017  . Macrocytosis without anemia 06/05/2017  . Anxiety and depression 04/13/2017  . HTN (hypertension) 04/13/2017  . Abnormal EKG 04/13/2017  . Smoking 02/08/2017  . Chest pain 02/08/2017  . Skin mass 08/12/2016  . Neuropathy 02/05/2015  . GERD (gastroesophageal reflux disease) 02/05/2015  . Environmental allergies 02/05/2015    Past Surgical History:  Procedure Laterality Date  . CHOLECYSTECTOMY  2000  . LEFT HEART CATH AND CORONARY ANGIOGRAPHY N/A 01/31/2017   Procedure: LEFT  HEART CATH AND CORONARY ANGIOGRAPHY;  Surgeon: Dionisio Cela Newcom, MD;  Location: [place] [facility];  Service: Cardiovascular;  Laterality: N/A;    Prior to Admission medications   Medication Sig Start Date End Date Taking? Authorizing Provider  aspirin 81 MG chewable tablet Chew by mouth.    [provider]  budesonide-formoterol (SYMBICORT) 160-4.5 MCG/ACT inhaler Inhale 2 puffs into the lungs 2 (two) times daily. Rinse mouth after use 01/19/18   McLean-Scocuzza, Nino Glow, MD  LORazepam (ATIVAN) 0.5 MG tablet Take 1 tablet (0.5 mg total) by mouth daily as needed for anxiety. 01/19/18   McLean-Scocuzza, Nino Glow, MD  losartan (COZAAR) 50 MG tablet Take 1 tablet (50 mg total) by mouth daily. 09/19/17   McLean-Scocuzza, Nino Glow, MD  Naproxen Sodium (ALEVE) 220 MG CAPS Take 1 capsule by mouth daily.    [provider]  Omega-3 Fatty Acids (FISH OIL) 1000 MG CAPS Take by mouth.    [provider]  OVER THE COUNTER MEDICATION Take 1 capsule by mouth daily.    [provider]  pantoprazole (PROTONIX) 20 MG tablet Take 1 tablet (20 mg total) by mouth daily. 30 minutes before food 09/19/17   McLean-Scocuzza, Nino Glow, MD  sertraline (ZOLOFT) 50 MG tablet Take 1.5 tablets (75 mg total) by mouth daily. In am 01/19/18   McLean-Scocuzza, Nino Glow, MD  vitamin B-12 (CYANOCOBALAMIN) 1000 MCG tablet Take 1,000 mcg by mouth daily.  [provider]    Allergies Patient has no known allergies.  Family History  Problem Relation Age of Onset  . Alcohol abuse Father   . Diabetes Father   . Cancer Father        ?stage IV thyroid with met  . Cancer Maternal Grandfather        Colon Cancer    Social History Social History   Tobacco Use  . Smoking status: Current Every Day Smoker    Packs/day: 1.00    Types: Cigarettes  . Smokeless tobacco: Never Used  Substance Use Topics  . Alcohol use: Yes    Alcohol/week: 21.0 standard drinks    Types: 21 Shots of liquor per  week    Comment: 1 pt will last 1 week   . Drug use: No    Review of Systems  Constitutional: No fever/chills Eyes: No visual changes. ENT: No sore throat. Cardiovascular: Denies chest pain. Respiratory: As above Gastrointestinal:   No constipation. Genitourinary: Negative for dysuria. Musculoskeletal: As above Skin: Negative for rash. Neurological: Negative for headaches, focal weakness or numbness.   ____________________________________________   PHYSICAL EXAM:  VITAL SIGNS: ED Triage Vitals  Enc Vitals Group     BP 03/01/18 2151 115/70     Pulse Rate 03/01/18 2151 (!) 122     Resp 03/01/18 2151 (!) 22     Temp 03/01/18 2151 98.4 F (36.9 C)     Temp src --      SpO2 03/01/18 2151 (!) 88 %     Weight 03/01/18 2152 200 lb (90.7 kg)     Height 03/01/18 2152 '5\' 9"'$  (1.753 m)     Head Circumference --      Peak Flow --      Pain Score 03/01/18 2151 8     Pain Loc --      Pain Edu? --      Excl. in [place]? --     Constitutional: Alert and oriented.  Appears weak and ill. Eyes: Conjunctivae are normal.  Head: Atraumatic. Nose: No congestion/rhinnorhea. Mouth/Throat: Mucous membranes are moist.  Neck: No stridor.   Cardiovascular: Tachycardic, regular rhythm. Grossly normal heart sounds.   Respiratory: Normal respiratory effort.  No retractions.  Diffuse wheezing with a mildly prolonged expiratory phase.  Wet sounding expiratory cough which is nonproductive. Gastrointestinal: Soft with mild and diffuse tenderness without rebound or guarding. No distention.  Musculoskeletal: No lower extremity tenderness nor edema.  No joint effusions. Neurologic:  Normal speech and language. No gross focal neurologic deficits are appreciated. Skin:  Skin is warm, dry and intact. No rash noted. Psychiatric: Mood and affect are normal. Speech and behavior are normal.  ____________________________________________   LABS (all labs ordered are listed, but only abnormal results are  displayed)  Labs Reviewed  COMPREHENSIVE METABOLIC PANEL - Abnormal; Notable for the following components:      Result Value   Potassium 2.1 (*)    Chloride 89 (*)    CO2 37 (*)    Glucose, Bld 131 (*)    Calcium 10.4 (*)    AST 75 (*)    ALT 50 (*)    Alkaline Phosphatase 142 (*)    All other components within normal limits  CBC WITH DIFFERENTIAL/PLATELET - Abnormal; Notable for the following components:   WBC 12.6 (*)    MCV 106.4 (*)    MCH 37.8 (*)    Neutro Abs 9.0 (*)    Monocytes Absolute 1.1 (*)  All other components within normal limits  LIPASE, BLOOD - Abnormal; Notable for the following components:   Lipase 127 (*)    All other components within normal limits  LACTIC ACID, PLASMA - Abnormal; Notable for the following components:   Lactic Acid, Venous 2.1 (*)    All other components within normal limits  CG4 I-STAT (LACTIC ACID) - Abnormal; Notable for the following components:   Lactic Acid, Venous 2.16 (*)    All other components within normal limits  CULTURE, BLOOD (ROUTINE X 2)  CULTURE, BLOOD (ROUTINE X 2)  PROTIME-INR  TROPONIN I  URINALYSIS, COMPLETE (UACMP) WITH MICROSCOPIC  LACTIC ACID, PLASMA  I-STAT CG4 LACTIC ACID, ED  I-STAT CG4 LACTIC ACID, ED   ____________________________________________  EKG  ED ECG REPORT I, Doran Stabler, the attending physician, personally viewed and interpreted this ECG.   Date: 03/01/2018  EKG Time: 2205  Rate: 115  Rhythm: sinus tachycardia with PVC x2.  Axis: Normal  Intervals:right bundle branch block  ST&T Change: No ST segment elevation or depression.  No abnormal T wave inversion.  No significant change from previous.  ____________________________________________  RADIOLOGY  Chest x-ray without active disease. ____________________________________________   PROCEDURES  Procedure(s) performed:   .Critical Care Performed by: Orbie Pyo, MD Authorized by: Orbie Pyo, MD   Critical care provider statement:    Critical care time (minutes):  35   Critical care time was exclusive of:  Separately billable procedures and treating other patients   Critical care was necessary to treat or prevent imminent or life-threatening deterioration of the following conditions:  Sepsis   Critical care was time spent personally by me on the following activities:  Development of treatment plan with patient or surrogate, discussions with consultants, evaluation of patient's response to treatment, examination of patient, obtaining history from patient or surrogate, ordering and performing treatments and interventions, ordering and review of laboratory studies, ordering and review of radiographic studies, pulse oximetry, re-evaluation of patient's condition and review of old charts   Critical Care performed:   ____________________________________________   INITIAL IMPRESSION / [place] / ED COURSE  Pertinent labs & imaging results that were available during my care of the patient were reviewed by me and considered in my medical decision making (see chart for details).  Differential diagnosis includes, but is not limited to, biliary disease (biliary colic, acute cholecystitis, cholangitis, choledocholithiasis, etc), intrathoracic causes for epigastric abdominal pain including ACS, gastritis, duodenitis, pancreatitis, small bowel or large bowel obstruction, abdominal aortic aneurysm, hernia, and ulcer(s). Differential includes, but is not limited to, viral syndrome, bronchitis including COPD exacerbation, pneumonia, reactive airway disease including asthma, CHF including exacerbation with or without pulmonary/interstitial edema, pneumothorax, ACS, thoracic trauma, and pulmonary embolism. As part of my medical decision making, I reviewed the following data within the electronic MEDICAL RECORD NUMBER Notes from prior ED  visits  ----------------------------------------- 11:40 PM on 03/01/2018 -----------------------------------------  Patient being treated for COPD exacerbation with steroids as well as nebulizer treatments.  Pending abdominal CT.  Will order stool studies as well.  Will require admission to the hospital but unclear if will be admitted to medicine versus surgery at this time.  Also will replete potassium.  Empiric antibiotics given for meeting of sepsis criteria.  Signed out to Dr. Owens Shark. ____________________________________________   FINAL CLINICAL IMPRESSION(S) / ED DIAGNOSES  Final diagnoses:  Hypoxia  COPD exacerbation.  Abdominal pain.  Sepsis.  Nausea vomiting and diarrhea.   NEW MEDICATIONS STARTED  DURING THIS VISIT:  New Prescriptions   No medications on file     Note:  This document was prepared using Dragon voice recognition software and may include unintentional dictation errors.     Orbie Pyo, MD 03/01/18 [phone]

## 2018-03-02 ENCOUNTER — Other Ambulatory Visit: Payer: Self-pay

## 2018-03-02 DIAGNOSIS — Z7982 Long term (current) use of aspirin: Secondary | ICD-10-CM | POA: Diagnosis not present

## 2018-03-02 DIAGNOSIS — R1084 Generalized abdominal pain: Secondary | ICD-10-CM | POA: Diagnosis present

## 2018-03-02 DIAGNOSIS — E785 Hyperlipidemia, unspecified: Secondary | ICD-10-CM | POA: Diagnosis present

## 2018-03-02 DIAGNOSIS — F1010 Alcohol abuse, uncomplicated: Secondary | ICD-10-CM | POA: Diagnosis not present

## 2018-03-02 DIAGNOSIS — E876 Hypokalemia: Secondary | ICD-10-CM | POA: Diagnosis not present

## 2018-03-02 DIAGNOSIS — F418 Other specified anxiety disorders: Secondary | ICD-10-CM | POA: Diagnosis present

## 2018-03-02 DIAGNOSIS — Z800 Family history of malignant neoplasm of digestive organs: Secondary | ICD-10-CM | POA: Diagnosis not present

## 2018-03-02 DIAGNOSIS — K219 Gastro-esophageal reflux disease without esophagitis: Secondary | ICD-10-CM | POA: Diagnosis present

## 2018-03-02 DIAGNOSIS — Z9049 Acquired absence of other specified parts of digestive tract: Secondary | ICD-10-CM | POA: Diagnosis not present

## 2018-03-02 DIAGNOSIS — Z716 Tobacco abuse counseling: Secondary | ICD-10-CM | POA: Diagnosis not present

## 2018-03-02 DIAGNOSIS — A419 Sepsis, unspecified organism: Secondary | ICD-10-CM | POA: Diagnosis present

## 2018-03-02 DIAGNOSIS — D3502 Benign neoplasm of left adrenal gland: Secondary | ICD-10-CM | POA: Diagnosis present

## 2018-03-02 DIAGNOSIS — K859 Acute pancreatitis without necrosis or infection, unspecified: Secondary | ICD-10-CM | POA: Diagnosis present

## 2018-03-02 DIAGNOSIS — I10 Essential (primary) hypertension: Secondary | ICD-10-CM | POA: Diagnosis not present

## 2018-03-02 DIAGNOSIS — Z791 Long term (current) use of non-steroidal anti-inflammatories (NSAID): Secondary | ICD-10-CM | POA: Diagnosis not present

## 2018-03-02 DIAGNOSIS — R0902 Hypoxemia: Secondary | ICD-10-CM | POA: Diagnosis present

## 2018-03-02 DIAGNOSIS — Z833 Family history of diabetes mellitus: Secondary | ICD-10-CM | POA: Diagnosis not present

## 2018-03-02 DIAGNOSIS — F17210 Nicotine dependence, cigarettes, uncomplicated: Secondary | ICD-10-CM | POA: Diagnosis present

## 2018-03-02 DIAGNOSIS — Z811 Family history of alcohol abuse and dependence: Secondary | ICD-10-CM | POA: Diagnosis not present

## 2018-03-02 DIAGNOSIS — J441 Chronic obstructive pulmonary disease with (acute) exacerbation: Secondary | ICD-10-CM | POA: Diagnosis present

## 2018-03-02 DIAGNOSIS — K852 Alcohol induced acute pancreatitis without necrosis or infection: Secondary | ICD-10-CM | POA: Diagnosis present

## 2018-03-02 LAB — URINALYSIS, COMPLETE (UACMP) WITH MICROSCOPIC
BILIRUBIN URINE: NEGATIVE
Bacteria, UA: NONE SEEN
GLUCOSE, UA: NEGATIVE mg/dL
Ketones, ur: NEGATIVE mg/dL
LEUKOCYTES UA: NEGATIVE
NITRITE: NEGATIVE
PH: 7 (ref 5.0–8.0)
PROTEIN: NEGATIVE mg/dL
Specific Gravity, Urine: 1.036 — ABNORMAL HIGH (ref 1.005–1.030)

## 2018-03-02 LAB — POTASSIUM: POTASSIUM: 3 mmol/L — AB (ref 3.5–5.1)

## 2018-03-02 LAB — LACTIC ACID, PLASMA: Lactic Acid, Venous: 2.4 mmol/L (ref 0.5–1.9)

## 2018-03-02 LAB — HEMOGLOBIN A1C
Hgb A1c MFr Bld: 4.7 % — ABNORMAL LOW (ref 4.8–5.6)
Mean Plasma Glucose: 88.19 mg/dL

## 2018-03-02 LAB — MAGNESIUM: Magnesium: 1.4 mg/dL — ABNORMAL LOW (ref 1.7–2.4)

## 2018-03-02 LAB — TSH: TSH: 2.626 u[IU]/mL (ref 0.350–4.500)

## 2018-03-02 MED ORDER — LORAZEPAM 0.5 MG PO TABS
0.5000 mg | ORAL_TABLET | Freq: Every day | ORAL | Status: DC | PRN
  Administered 2018-03-02: 0.5 mg via ORAL
  Filled 2018-03-02: qty 1

## 2018-03-02 MED ORDER — FLUTICASONE FUROATE-VILANTEROL 200-25 MCG/INH IN AEPB
1.0000 | INHALATION_SPRAY | Freq: Every day | RESPIRATORY_TRACT | Status: DC
  Administered 2018-03-02 – 2018-03-03 (×2): 1 via RESPIRATORY_TRACT
  Filled 2018-03-02: qty 28

## 2018-03-02 MED ORDER — ONDANSETRON HCL 4 MG/2ML IJ SOLN: 4.0000 mg | Freq: Four times a day (QID) | INTRAMUSCULAR | Status: DC | PRN

## 2018-03-02 MED ORDER — DOCUSATE SODIUM 100 MG PO CAPS
100.0000 mg | ORAL_CAPSULE | Freq: Two times a day (BID) | ORAL | Status: DC
  Administered 2018-03-02 – 2018-03-03 (×2): 100 mg via ORAL
  Filled 2018-03-02 (×2): qty 1

## 2018-03-02 MED ORDER — SODIUM CHLORIDE 0.9 % IV BOLUS
1000.0000 mL | INTRAVENOUS | Status: AC
  Administered 2018-03-02: 1000 mL via INTRAVENOUS

## 2018-03-02 MED ORDER — ASPIRIN 81 MG PO CHEW
81.0000 mg | CHEWABLE_TABLET | Freq: Every day | ORAL | Status: DC
  Administered 2018-03-02 – 2018-03-03 (×2): 81 mg via ORAL
  Filled 2018-03-02 (×2): qty 1

## 2018-03-02 MED ORDER — ENOXAPARIN SODIUM 40 MG/0.4ML ~~LOC~~ SOLN
40.0000 mg | SUBCUTANEOUS | Status: DC
  Administered 2018-03-02 – 2018-03-03 (×2): 40 mg via SUBCUTANEOUS
  Filled 2018-03-02 (×2): qty 0.4

## 2018-03-02 MED ORDER — MORPHINE SULFATE (PF) 2 MG/ML IV SOLN: 2.0000 mg | INTRAVENOUS | Status: DC | PRN

## 2018-03-02 MED ORDER — ONDANSETRON HCL 4 MG PO TABS: 4.0000 mg | ORAL_TABLET | Freq: Four times a day (QID) | ORAL | Status: DC | PRN

## 2018-03-02 MED ORDER — ACETAMINOPHEN 325 MG PO TABS: 650.0000 mg | ORAL_TABLET | Freq: Four times a day (QID) | ORAL | Status: DC | PRN

## 2018-03-02 MED ORDER — IPRATROPIUM-ALBUTEROL 0.5-2.5 (3) MG/3ML IN SOLN: 3.0000 mL | RESPIRATORY_TRACT | Status: DC | PRN

## 2018-03-02 MED ORDER — ACETAMINOPHEN 650 MG RE SUPP: 650.0000 mg | Freq: Four times a day (QID) | RECTAL | Status: DC | PRN

## 2018-03-02 MED ORDER — MAGNESIUM SULFATE 2 GM/50ML IV SOLN
2.0000 g | Freq: Once | INTRAVENOUS | Status: AC
  Administered 2018-03-02: 2 g via INTRAVENOUS
  Filled 2018-03-02: qty 50

## 2018-03-02 MED ORDER — NICOTINE 21 MG/24HR TD PT24
21.0000 mg | MEDICATED_PATCH | Freq: Once | TRANSDERMAL | Status: AC
  Administered 2018-03-02: 21 mg via TRANSDERMAL
  Filled 2018-03-02: qty 1

## 2018-03-02 MED ORDER — POTASSIUM CHLORIDE IN NACL 40-0.9 MEQ/L-% IV SOLN
INTRAVENOUS | Status: DC
  Administered 2018-03-02 – 2018-03-03 (×3): 125 mL/h via INTRAVENOUS
  Filled 2018-03-02 (×8): qty 1000

## 2018-03-02 MED ORDER — SERTRALINE HCL 50 MG PO TABS
75.0000 mg | ORAL_TABLET | Freq: Every day | ORAL | Status: DC
  Administered 2018-03-02 – 2018-03-03 (×2): 75 mg via ORAL
  Filled 2018-03-02 (×3): qty 2

## 2018-03-02 MED ORDER — LOSARTAN POTASSIUM 50 MG PO TABS
50.0000 mg | ORAL_TABLET | Freq: Every day | ORAL | Status: DC
  Administered 2018-03-02 – 2018-03-03 (×2): 50 mg via ORAL
  Filled 2018-03-02 (×2): qty 1

## 2018-03-02 MED ORDER — MORPHINE SULFATE (PF) 4 MG/ML IV SOLN: 4.0000 mg | INTRAVENOUS | Status: DC | PRN

## 2018-03-02 MED ORDER — PANTOPRAZOLE SODIUM 20 MG PO TBEC
20.0000 mg | DELAYED_RELEASE_TABLET | Freq: Every day | ORAL | Status: DC
  Administered 2018-03-02 – 2018-03-03 (×2): 20 mg via ORAL
  Filled 2018-03-02 (×2): qty 1

## 2018-03-02 NOTE — Progress Notes (Signed)
[place] [facility] [place] NAME: Darrell Santiago    MR#:  [id number]  DATE OF BIRTH:  [birth year]  SUBJECTIVE:  CHIEF COMPLAINT:   Chief Complaint  Patient presents with  . Abdominal Pain   Came with abd pain, noted to have ac pancretitis.some better today. REVIEW OF SYSTEMS:  CONSTITUTIONAL: No fever, fatigue or weakness.  EYES: No blurred or double vision.  EARS, NOSE, AND THROAT: No tinnitus or ear pain.  RESPIRATORY: No cough, shortness of breath, wheezing or hemoptysis.  CARDIOVASCULAR: No chest pain, orthopnea, edema.  GASTROINTESTINAL: have nausea, vomiting, diarrhea or abdominal pain.  GENITOURINARY: No dysuria, hematuria.  ENDOCRINE: No polyuria, nocturia,  HEMATOLOGY: No anemia, easy bruising or bleeding SKIN: No rash or lesion. MUSCULOSKELETAL: No joint pain or arthritis.   NEUROLOGIC: No tingling, numbness, weakness.  PSYCHIATRY: No anxiety or depression.   ROS  DRUG ALLERGIES:  No Known Allergies  VITALS:  Blood pressure 111/73, pulse 96, temperature 98.7 F (37.1 C), temperature source Oral, resp. rate 19, height 5\' 9"  (1.753 m), weight 90.7 kg, SpO2 96 %.  PHYSICAL EXAMINATION:  GENERAL:  52 y.o.-year-old patient lying in the bed with no acute distress.  EYES: Pupils equal, round, reactive to light and accommodation. No scleral icterus. Extraocular muscles intact.  HEENT: Head atraumatic, normocephalic. Oropharynx and nasopharynx clear.  NECK:  Supple, no jugular venous distention. No thyroid enlargement, no tenderness.  LUNGS: Normal breath sounds bilaterally, no wheezing, rales,rhonchi or crepitation. No use of accessory muscles of respiration.  CARDIOVASCULAR: S1, S2 normal. No murmurs, rubs, or gallops.  ABDOMEN: Soft, tender, nondistended. Bowel sounds present. No organomegaly or mass.  EXTREMITIES: No pedal edema, cyanosis, or clubbing.  NEUROLOGIC: Cranial nerves II through XII are intact. Muscle strength 5/5 in all  extremities. Sensation intact. Gait not checked.  PSYCHIATRIC: The patient is alert and oriented x 3.  SKIN: No obvious rash, lesion, or ulcer.   Physical Exam LABORATORY PANEL:   CBC Recent Labs  Lab 03/01/18 2213  WBC 12.6*  HGB 16.5  HCT 46.5  PLT 190   ------------------------------------------------------------------------------------------------------------------  Chemistries  Recent Labs  Lab 03/01/18 2213 03/02/18 1209  NA 138  --   K 2.1* 3.0*  CL 89*  --   CO2 37*  --   GLUCOSE 131*  --   BUN 6  --   CREATININE 0.68  --   CALCIUM 10.4*  --   MG  --  1.4*  AST 75*  --   ALT 50*  --   ALKPHOS 142*  --   BILITOT 1.1  --    ------------------------------------------------------------------------------------------------------------------  Cardiac Enzymes Recent Labs  Lab 03/01/18 2213  TROPONINI <0.03   ------------------------------------------------------------------------------------------------------------------  RADIOLOGY:  Ct Abdomen Pelvis W Contrast  Result Date: 03/02/2018 CLINICAL DATA:  Abdominal pain. EXAM: CT ABDOMEN AND PELVIS WITH CONTRAST TECHNIQUE: Multidetector CT imaging of the abdomen and pelvis was performed using the standard protocol following bolus administration of intravenous contrast. CONTRAST:  137mL ISOVUE-300 IOPAMIDOL (ISOVUE-300) INJECTION 61% COMPARISON:  October 22, 2009 FINDINGS: Lower chest: No acute abnormality. Hepatobiliary: Decreased attenuation throughout the liver, somewhat heterogeneous with more focal low attenuation near the dome is likely heterogeneous hepatic steatosis. No focal mass noted. Portal vein is patent. Patient is status post cholecystectomy. Pancreas: Peripancreatic stranding is identified, particularly adjacent to the distal body. There is low attenuation along the inferior aspect of the pancreas in this region well seen on coronal image 43, measuring up to  2.1 cm. This low-attenuation exerts mass effect on  the splenic vein with narrowing but without complete occlusion or thrombosis. There is a calcification in the proximal pancreatic body which is new, in close proximity to the pancreatic duct. Calcifications in the head of the pancreas are stable, consistent with previous pancreatitis. Spleen: Normal in size without focal abnormality. Adrenals/Urinary Tract: There is a low-density nodule in the left adrenal gland consistent with an adenoma, also present previously. The right adrenal gland is normal. Probable cysts in the left kidney, too small to completely characterize. No suspicious masses, hydronephrosis, or perinephric stranding. The ureters and bladder are normal. Stomach/Bowel: Stomach and small bowel are normal. The colon and appendix are normal. Vascular/Lymphatic: The abdominal aorta is unremarkable. Prominent lymph nodes in the mesentery and retroperitoneum are likely reactive. Reproductive: Prostate is unremarkable. Other: No abdominal wall hernia or abnormality. No abdominopelvic ascites. Musculoskeletal: No acute or significant osseous findings. IMPRESSION: 1. Pancreatitis. 2. The 2.1 cm low-attenuation mass along the inferior aspect of the pancreas is likely a pseudocyst exerting mass effect on the adjacent splenic vein. The splenic vein is narrowed but not thrombosed or completely occluded. 3. The calcification in the pancreatic body is in close proximity to the pancreatic duct. 4. Left adrenal adenoma. 5. Atherosclerotic change in the abdominal aorta. Electronically Signed   By: Dorise Bullion III M.D   On: 03/02/2018 00:06   Dg Chest Port 1 View  Result Date: 03/01/2018 CLINICAL DATA:  52 y/o M; lower abdominal pain, nausea, vomiting, diarrhea. EXAM: PORTABLE CHEST 1 VIEW COMPARISON:  01/29/2017 CT chest and chest radiograph. FINDINGS: Stable heart size and mediastinal contours are within normal limits. Both lungs are clear. The visualized skeletal structures are unremarkable. IMPRESSION: No  active disease. Electronically Signed   By: Kristine Garbe M.D.   On: 03/01/2018 22:36    ASSESSMENT AND PLAN:   Active Problems:   Pancreatitis   1.  Pancreatitis: Manage severe pain with IV morphine.  Elevated lactic acid.  Continue to hydrate with intravenous fluid.   TOlerating clear liquid diet, upgrade to full liquid. 2.  Hypokalemia: Secondary to vomiting; replete potassium with no arrhythmias. Mg is low, replace. 3.  Hypertension: Controlled; continue losartan 4.  Alcohol abuse: Check CIWA scale 48 hours since last drink   No withdrawal for now. 5.  Depression: Continue Zoloft 6.  DVT prophylaxis: Lovenox 7.  GI prophylaxis: Pantoprazole per home regimen . 8. Tobacco abuse- counseled tot quit for 4 min.  All the records are reviewed and case discussed with Care Management/Social Workerr. Management plans discussed with the patient, family and they are in agreement.  CODE STATUS: Full.  TOTAL TIME TAKING CARE OF THIS PATIENT: 35 minutes.     POSSIBLE D/C IN 1-2 DAYS, DEPENDING ON CLINICAL CONDITION.   Vaughan Basta M.D on 03/02/2018   Between 7am to 6pm - Pager - [phone]  After 6pm go to [email or web address] - password [email or web address] [facility]  Office  [phone]  CC: Primary care physician; McLean-Scocuzza, Nino Glow, MD  Note: This dictation was prepared with Dragon dictation along with smaller phrase technology. Any transcriptional errors that result from this process are unintentional.

## 2018-03-02 NOTE — Progress Notes (Signed)
Notified MD pt in room 214 potassium is 3.0

## 2018-03-02 NOTE — Progress Notes (Signed)
Pharmacy Antibiotic Note  Darrell Santiago is a 52 y.o. male admitted on 03/01/2018 with intra-abdominal infection.  Pharmacy has been consulted for cefepime dosing.  Plan: Cefepime 2 grams q 8 hours ordered  Height: 5\' 9"  (175.3 cm) Weight: 200 lb (90.7 kg) IBW/kg (Calculated) : 70.7  Temp (24hrs), Avg:98.4 F (36.9 C), Min:98.4 F (36.9 C), Max:98.4 F (36.9 C)  Recent Labs  Lab 03/01/18 2213 03/01/18 2214 03/01/18 2225  WBC 12.6*  --   --   CREATININE 0.68  --   --   LATICACIDVEN  --  2.1* 2.16*    Estimated Creatinine Clearance: 123 mL/min (by C-G formula based on SCr of 0.68 mg/dL).    No Known Allergies  Antimicrobials this admission: Cefepime, metronidazole 11/14 >>    >>   Dose adjustments this admission:   Microbiology results: 11/14 BCx: pending   Thank you for allowing pharmacy to be a part of this patient's care.  Jamina Macbeth S 03/02/2018 12:35 AM

## 2018-03-02 NOTE — Progress Notes (Signed)
Pt cont with iv abx . ivfs cont 02 2l [place].

## 2018-03-02 NOTE — H&P (Signed)
Darrell Santiago is an 52 y.o. male.   Chief Complaint: Abdominal pain HPI: The patient with past medical history of alcohol abuse, hyperlipidemia and hypertension presents to the emergency department complaining of abdominal pain.  The patient reports that he has been having diarrhea several times a day for 1 week.  He also admits to some nausea and vomiting which is nonbloody nonbilious.  Laboratory evaluation revealed elevated lipase as well as transaminitis which prompted the emergency department staff to call hospitalist service for admission.  Past Medical History:  Diagnosis Date  . Abnormal EKG   . Allergy    Seasonal  . Anxiety   . Chicken pox   . Depression   . GERD (gastroesophageal reflux disease)   . Hyperlipidemia   . Hypertension   . Urine incontinence     Past Surgical History:  Procedure Laterality Date  . CHOLECYSTECTOMY  2000  . LEFT HEART CATH AND CORONARY ANGIOGRAPHY N/A 01/31/2017   Procedure: LEFT HEART CATH AND CORONARY ANGIOGRAPHY;  Surgeon: Dionisio David, MD;  Location: [facility];  Service: Cardiovascular;  Laterality: N/A;    Family History  Problem Relation Age of Onset  . Alcohol abuse Father   . Diabetes Father   . Cancer Father        ?stage IV thyroid with met  . Cancer Maternal Grandfather        Colon Cancer   Social History:  reports that he has been smoking cigarettes. He has been smoking about 1.00 pack per day. He has never used smokeless tobacco. He reports that he drinks about 21.0 standard drinks of alcohol per week. He reports that he does not use drugs.  Allergies: No Known Allergies  Medications Prior to Admission  Medication Sig Dispense Refill  . aspirin 81 MG chewable tablet Chew by mouth.    . budesonide-formoterol (SYMBICORT) 160-4.5 MCG/ACT inhaler Inhale 2 puffs into the lungs 2 (two) times daily. Rinse mouth after use 1 Inhaler 12  . LORazepam (ATIVAN) 0.5 MG tablet Take 1 tablet (0.5 mg total) by mouth daily as  needed for anxiety. 30 tablet 2  . losartan (COZAAR) 50 MG tablet Take 1 tablet (50 mg total) by mouth daily. 90 tablet 1  . Naproxen Sodium (ALEVE) 220 MG CAPS Take 1 capsule by mouth daily.    . Omega-3 Fatty Acids (FISH OIL) 1000 MG CAPS Take by mouth.    Marland Kitchen OVER THE COUNTER MEDICATION Take 1 capsule by mouth daily.    . pantoprazole (PROTONIX) 20 MG tablet Take 1 tablet (20 mg total) by mouth daily. 30 minutes before food 90 tablet 3  . sertraline (ZOLOFT) 50 MG tablet Take 1.5 tablets (75 mg total) by mouth daily. In am 45 tablet 5  . vitamin B-12 (CYANOCOBALAMIN) 1000 MCG tablet Take 1,000 mcg by mouth daily.       Results for orders placed or performed during the hospital encounter of 03/01/18 (from the past 48 hour(s))  Comprehensive metabolic panel     Status: Abnormal   Collection Time: 03/01/18 10:13 PM  Result Value Ref Range   Sodium 138 135 - 145 mmol/L   Potassium 2.1 (LL) 3.5 - 5.1 mmol/L    Comment: CRITICAL RESULT CALLED TO, READ BACK BY AND VERIFIED WITH JEN BAILEY 03/01/18 2240 JML    Chloride 89 (L) 98 - 111 mmol/L   CO2 37 (H) 22 - 32 mmol/L   Glucose, Bld 131 (H) 70 - 99 mg/dL  BUN 6 6 - 20 mg/dL   Creatinine, Ser 0.68 0.61 - 1.24 mg/dL   Calcium 10.4 (H) 8.9 - 10.3 mg/dL   Total Protein 7.6 6.5 - 8.1 g/dL   Albumin 4.4 3.5 - 5.0 g/dL   AST 75 (H) 15 - 41 U/L   ALT 50 (H) 0 - 44 U/L   Alkaline Phosphatase 142 (H) 38 - 126 U/L   Total Bilirubin 1.1 0.3 - 1.2 mg/dL   GFR calc non Af Amer >60 >60 mL/min   GFR calc Af Amer >60 >60 mL/min    Comment: (NOTE) The eGFR has been calculated using the CKD EPI equation. This calculation has not been validated in all clinical situations. eGFR's persistently <60 mL/min signify possible Chronic Kidney Disease.    Anion gap 12 5 - 15    Comment: Performed at [facility], [address] [place], [place] [address]  CBC with Differential     Status: Abnormal   Collection Time: 03/01/18 10:13 PM  Result Value  Ref Range   WBC 12.6 (H) 4.0 - 10.5 K/uL   RBC 4.37 4.22 - 5.81 MIL/uL   Hemoglobin 16.5 13.0 - 17.0 g/dL   HCT 46.5 39.0 - 52.0 %   MCV 106.4 (H) 80.0 - 100.0 fL   MCH 37.8 (H) 26.0 - 34.0 pg   MCHC 35.5 30.0 - 36.0 g/dL   RDW 12.7 11.5 - 15.5 %   Platelets 190 150 - 400 K/uL   nRBC 0.0 0.0 - 0.2 %   Neutrophils Relative % 70 %   Neutro Abs 9.0 (H) 1.7 - 7.7 K/uL   Lymphocytes Relative 18 %   Lymphs Abs 2.2 0.7 - 4.0 K/uL   Monocytes Relative 9 %   Monocytes Absolute 1.1 (H) 0.1 - 1.0 K/uL   Eosinophils Relative 1 %   Eosinophils Absolute 0.2 0.0 - 0.5 K/uL   Basophils Relative 1 %   Basophils Absolute 0.1 0.0 - 0.1 K/uL   Immature Granulocytes 1 %   Abs Immature Granulocytes 0.06 0.00 - 0.07 K/uL    Comment: Performed at [facility], [address] [place], [place] [address]  Protime-INR     Status: None   Collection Time: 03/01/18 10:13 PM  Result Value Ref Range   Prothrombin Time 12.4 11.4 - 15.2 seconds   INR 0.93     Comment: Performed at [facility], [address] [place], [place] [address]  Culture, blood (Routine x 2)     Status: None (Preliminary result)   Collection Time: 03/01/18 10:13 PM  Result Value Ref Range   Specimen Description BLOOD LEFT ANTECUBITAL    Special Requests      BOTTLES DRAWN AEROBIC AND ANAEROBIC Blood Culture results may not be optimal due to an excessive volume of blood received in culture bottles   Culture      NO GROWTH < 12 HOURS Performed at [facility], [address] [place], [place] [address]    Report Status PENDING   Culture, blood (Routine x 2)     Status: None (Preliminary result)   Collection Time: 03/01/18 10:13 PM  Result Value Ref Range   Specimen Description BLOOD RIGHT ANTECUBITAL    Special Requests      BOTTLES DRAWN AEROBIC AND ANAEROBIC Blood Culture adequate volume   Culture      NO GROWTH < 12 HOURS Performed at [facility], [address], [place], [place]  [address]  Report Status PENDING   Urinalysis, Complete w Microscopic     Status: Abnormal   Collection Time: 03/01/18 10:13 PM  Result Value Ref Range   Color, Urine YELLOW (A) YELLOW   APPearance CLEAR (A) CLEAR   Specific Gravity, Urine 1.036 (H) 1.005 - 1.030   pH 7.0 5.0 - 8.0   Glucose, UA NEGATIVE NEGATIVE mg/dL   Hgb urine dipstick MODERATE (A) NEGATIVE   Bilirubin Urine NEGATIVE NEGATIVE   Ketones, ur NEGATIVE NEGATIVE mg/dL   Protein, ur NEGATIVE NEGATIVE mg/dL   Nitrite NEGATIVE NEGATIVE   Leukocytes, UA NEGATIVE NEGATIVE   RBC / HPF 0-5 0 - 5 RBC/hpf   WBC, UA 0-5 0 - 5 WBC/hpf   Bacteria, UA NONE SEEN NONE SEEN   Squamous Epithelial / LPF 0-5 0 - 5    Comment: Performed at [facility], [address] [place], [place] [address]  Lipase, blood     Status: Abnormal   Collection Time: 03/01/18 10:13 PM  Result Value Ref Range   Lipase 127 (H) 11 - 51 U/L    Comment: Performed at [facility], [address] [place], [place] [address]  Troponin I - Add-On to previous collection     Status: None   Collection Time: 03/01/18 10:13 PM  Result Value Ref Range   Troponin I <0.03 <0.03 ng/mL    Comment: Performed at [facility], [address] [place], [place] [address]  TSH     Status: None   Collection Time: 03/01/18 10:13 PM  Result Value Ref Range   TSH 2.626 0.350 - 4.500 uIU/mL    Comment: Performed by a 3rd Generation assay with a functional sensitivity of <=0.01 uIU/mL. Performed at [facility], [address] [place], [place] [address]   Hemoglobin A1c     Status: Abnormal   Collection Time: 03/01/18 10:13 PM  Result Value Ref Range   Hgb A1c MFr Bld 4.7 (L) 4.8 - 5.6 %    Comment: (NOTE) Pre diabetes:          5.7%-6.4% Diabetes:              >6.4% Glycemic control for   <7.0% adults with diabetes    Mean Plasma Glucose 88.19 mg/dL    Comment: Performed at [facility] [address], [place], [place]  [address]  Lactic acid, plasma     Status: Abnormal   Collection Time: 03/01/18 10:14 PM  Result Value Ref Range   Lactic Acid, Venous 2.1 (HH) 0.5 - 1.9 mmol/L    Comment: CRITICAL RESULT CALLED TO, READ BACK BY AND VERIFIED WITH SHERRI ALISON 03/01/18 2251 JML Performed at [facility], [place], [place] [address]   CG4 I-STAT (Lactic acid)     Status: Abnormal   Collection Time: 03/01/18 10:25 PM  Result Value Ref Range   Lactic Acid, Venous 2.16 (HH) 0.5 - 1.9 mmol/L   Comment MD NOTIFIED, REPEAT TEST   Lactic acid, plasma     Status: Abnormal   Collection Time: 03/02/18  2:46 AM  Result Value Ref Range   Lactic Acid, Venous 2.4 (HH) 0.5 - 1.9 mmol/L    Comment: CRITICAL RESULT CALLED TO, READ BACK BY AND VERIFIED WITH JENNIFER DALEY ON 03/02/18 AT 0315 JAG Performed at [facility], [address], [place], [place] [address]    Ct Abdomen Pelvis W Contrast  Result Date: 03/02/2018 CLINICAL DATA:  Abdominal pain.  EXAM: CT ABDOMEN AND PELVIS WITH CONTRAST TECHNIQUE: Multidetector CT imaging of the abdomen and pelvis was performed using the standard protocol following bolus administration of intravenous contrast. CONTRAST:  190m ISOVUE-300 IOPAMIDOL (ISOVUE-300) INJECTION 61% COMPARISON:  October 22, 2009 FINDINGS: Lower chest: No acute abnormality. Hepatobiliary: Decreased attenuation throughout the liver, somewhat heterogeneous with more focal low attenuation near the dome is likely heterogeneous hepatic steatosis. No focal mass noted. Portal vein is patent. Patient is status post cholecystectomy. Pancreas: Peripancreatic stranding is identified, particularly adjacent to the distal body. There is low attenuation along the inferior aspect of the pancreas in this region well seen on coronal image 43, measuring up to 2.1 cm. This low-attenuation exerts mass effect on the splenic vein with narrowing but without complete occlusion or thrombosis. There is a  calcification in the proximal pancreatic body which is new, in close proximity to the pancreatic duct. Calcifications in the head of the pancreas are stable, consistent with previous pancreatitis. Spleen: Normal in size without focal abnormality. Adrenals/Urinary Tract: There is a low-density nodule in the left adrenal gland consistent with an adenoma, also present previously. The right adrenal gland is normal. Probable cysts in the left kidney, too small to completely characterize. No suspicious masses, hydronephrosis, or perinephric stranding. The ureters and bladder are normal. Stomach/Bowel: Stomach and small bowel are normal. The colon and appendix are normal. Vascular/Lymphatic: The abdominal aorta is unremarkable. Prominent lymph nodes in the mesentery and retroperitoneum are likely reactive. Reproductive: Prostate is unremarkable. Other: No abdominal wall hernia or abnormality. No abdominopelvic ascites. Musculoskeletal: No acute or significant osseous findings. IMPRESSION: 1. Pancreatitis. 2. The 2.1 cm low-attenuation mass along the inferior aspect of the pancreas is likely a pseudocyst exerting mass effect on the adjacent splenic vein. The splenic vein is narrowed but not thrombosed or completely occluded. 3. The calcification in the pancreatic body is in close proximity to the pancreatic duct. 4. Left adrenal adenoma. 5. Atherosclerotic change in the abdominal aorta. Electronically Signed   By: DDorise BullionIII M.D   On: 03/02/2018 00:06   Dg Chest Port 1 View  Result Date: 03/01/2018 CLINICAL DATA:  52y/o M; lower abdominal pain, nausea, vomiting, diarrhea. EXAM: PORTABLE CHEST 1 VIEW COMPARISON:  01/29/2017 CT chest and chest radiograph. FINDINGS: Stable heart size and mediastinal contours are within normal limits. Both lungs are clear. The visualized skeletal structures are unremarkable. IMPRESSION: No active disease. Electronically Signed   By: LKristine GarbeM.D.   On: 03/01/2018  22:36    Review of Systems  Constitutional: Negative for chills and fever.  HENT: Negative for sore throat and tinnitus.   Eyes: Negative for blurred vision and redness.  Respiratory: Negative for cough and shortness of breath.   Cardiovascular: Negative for chest pain, palpitations, orthopnea and PND.  Gastrointestinal: Positive for abdominal pain. Negative for diarrhea, nausea and vomiting.  Genitourinary: Negative for dysuria, frequency and urgency.  Musculoskeletal: Negative for joint pain and myalgias.  Skin: Negative for rash.       No lesions  Neurological: Negative for speech change, focal weakness and weakness.  Endo/Heme/Allergies: Does not bruise/bleed easily.       No temperature intolerance  Psychiatric/Behavioral: Negative for depression and suicidal ideas.    Blood pressure 114/73, pulse 88, temperature 98.2 F (36.8 C), temperature source Oral, resp. rate 17, height 5' 9" (1.753 m), weight 90.7 kg, SpO2 94 %. Physical Exam  Vitals reviewed. Constitutional: He is oriented to person, place, and time. He appears  well-developed and well-nourished. No distress.  HENT:  Head: Normocephalic and atraumatic.  Mouth/Throat: Oropharynx is clear and moist.  Eyes: Pupils are equal, round, and reactive to light. Conjunctivae and EOM are normal. No scleral icterus.  Neck: Normal range of motion. Neck supple. No JVD present. No tracheal deviation present. No thyromegaly present.  Cardiovascular: Normal rate and regular rhythm. Exam reveals no gallop and no friction rub.  No murmur heard. Respiratory: Effort normal and breath sounds normal. No respiratory distress.  GI: Soft. Bowel sounds are normal. He exhibits no distension. There is no tenderness.  Genitourinary:  Genitourinary Comments: Deferred  Musculoskeletal: Normal range of motion. He exhibits no edema.  Lymphadenopathy:    He has no cervical adenopathy.  Neurological: He is alert and oriented to person, place, and  time. No cranial nerve deficit.  Skin: Skin is warm and dry. No rash noted. No erythema.  Psychiatric: He has a normal mood and affect. His behavior is normal. Judgment and thought content normal.     Assessment/Plan This is a 52 year old male admitted for pancreatitis. 1.  Pancreatitis: Manage severe pain with IV morphine.  Elevated lactic acid.  Continue to hydrate with intravenous fluid.  The patient may have a clear diet and advance as tolerated. 2.  Hypokalemia: Secondary to vomiting; replete potassium with no arrhythmias. 3.  Hypertension: Controlled; continue losartan 4.  Alcohol abuse: Check CIWA scale 48 hours since last drink 5.  Depression: Continue Zoloft 6.  DVT prophylaxis: Lovenox 7.  GI prophylaxis: Pantoprazole per home regimen Exline the patient is a full code.  Time spent on admission orders and patient care approximately 45 minutes  Harrie Foreman, MD 03/02/2018, 8:46 AM

## 2018-03-03 LAB — CBC
HCT: 37.9 % — ABNORMAL LOW (ref 39.0–52.0)
HEMOGLOBIN: 12.8 g/dL — AB (ref 13.0–17.0)
MCH: 37.2 pg — ABNORMAL HIGH (ref 26.0–34.0)
MCHC: 33.8 g/dL (ref 30.0–36.0)
MCV: 110.2 fL — ABNORMAL HIGH (ref 80.0–100.0)
Platelets: 128 10*3/uL — ABNORMAL LOW (ref 150–400)
RBC: 3.44 MIL/uL — ABNORMAL LOW (ref 4.22–5.81)
RDW: 12.7 % (ref 11.5–15.5)
WBC: 8.5 10*3/uL (ref 4.0–10.5)
nRBC: 0 % (ref 0.0–0.2)

## 2018-03-03 LAB — COMPREHENSIVE METABOLIC PANEL
ALBUMIN: 3.1 g/dL — AB (ref 3.5–5.0)
ALT: 34 U/L (ref 0–44)
ANION GAP: 8 (ref 5–15)
AST: 50 U/L — AB (ref 15–41)
Alkaline Phosphatase: 99 U/L (ref 38–126)
BILIRUBIN TOTAL: 1.1 mg/dL (ref 0.3–1.2)
BUN: 9 mg/dL (ref 6–20)
CO2: 29 mmol/L (ref 22–32)
Calcium: 8.2 mg/dL — ABNORMAL LOW (ref 8.9–10.3)
Chloride: 104 mmol/L (ref 98–111)
Creatinine, Ser: 0.51 mg/dL — ABNORMAL LOW (ref 0.61–1.24)
GFR calc Af Amer: >60 mL/min (ref >60)
GLUCOSE: 108 mg/dL — AB (ref 70–99)
POTASSIUM: 2.9 mmol/L — AB (ref 3.5–5.1)
Sodium: 141 mmol/L (ref 135–145)
TOTAL PROTEIN: 5.6 g/dL — AB (ref 6.5–8.1)

## 2018-03-03 LAB — LIPASE, BLOOD: LIPASE: 91 U/L — AB (ref 11–51)

## 2018-03-03 LAB — MAGNESIUM: MAGNESIUM: 1.9 mg/dL (ref 1.7–2.4)

## 2018-03-03 MED ORDER — POTASSIUM CHLORIDE 10 MEQ/100ML IV SOLN
10.0000 meq | INTRAVENOUS | Status: AC
  Administered 2018-03-03 (×4): 10 meq via INTRAVENOUS
  Filled 2018-03-03: qty 100

## 2018-03-03 MED ORDER — POTASSIUM CHLORIDE CRYS ER 20 MEQ PO TBCR
40.0000 meq | EXTENDED_RELEASE_TABLET | Freq: Once | ORAL | Status: AC
  Administered 2018-03-03: 40 meq via ORAL
  Filled 2018-03-03: qty 2

## 2018-03-03 NOTE — Progress Notes (Signed)
Discharge instructions given. Pt and wife verbalize understanding.

## 2018-03-03 NOTE — Discharge Summary (Signed)
[facility] NAME: Darrell Santiago    MR#:  [id number]  DATE OF BIRTH:  May 31, 1965  DATE OF ADMISSION:  03/01/2018 ADMITTING PHYSICIAN: Harrie Foreman, MD  DATE OF DISCHARGE: 03/03/2018  PRIMARY CARE PHYSICIAN: McLean-Scocuzza, Nino Glow, MD    ADMISSION DIAGNOSIS:  Generalized abdominal pain [R10.84] Hypoxia [R09.02] COPD exacerbation (HCC) [J44.1] Nausea vomiting and diarrhea [R11.2, R19.7] Acute pancreatitis, unspecified complication status, unspecified pancreatitis type [K85.90] Sepsis, due to unspecified organism, unspecified whether acute organ dysfunction present ([place]) [A41.9]  DISCHARGE DIAGNOSIS:  Active Problems:   Pancreatitis   SECONDARY DIAGNOSIS:   Past Medical History:  Diagnosis Date  . Abnormal EKG   . Allergy    Seasonal  . Anxiety   . Chicken pox   . Depression   . GERD (gastroesophageal reflux disease)   . Hyperlipidemia   . Hypertension   . Urine incontinence     HOSPITAL COURSE:   52 year old male with EtOH dependence and past history of pancreatitis who presented with abdominal pain.  1.  Acute pancreatitis due to EtOH abuse: CT scan does show a small pseudocyst which she will need outpatient follow-up 4.  His abdominal pain is subsided.  He is tolerating his diet well.  I encouraged the patient to stop drinking EtOH.  2.  EtOH abuse: Patient was on CIWA protocol with uneventful detox.  3. Tobacco dependence: Patient is encouraged to quit smoking. Counseling was provided for 4 minutes. 4.  Electrolyte abnormalities due to EtOH abuse and poor nutrition: These were repleted prior to discharge  5.  Essential hypertension: Continue losartan 6.  Depression: Continue Zoloft  DISCHARGE CONDITIONS AND DIET:  Stable Regular diet  CONSULTS OBTAINED:    DRUG ALLERGIES:  No Known Allergies  DISCHARGE MEDICATIONS:   Allergies as of 03/03/2018   No Known Allergies     Medication List     TAKE these medications   ALEVE 220 MG Caps Generic drug:  Naproxen Sodium Take 1 capsule by mouth daily as needed (workplace pain).   aspirin 81 MG chewable tablet Chew 81 mg by mouth daily.   budesonide-formoterol 160-4.5 MCG/ACT inhaler Commonly known as:  SYMBICORT Inhale 2 puffs into the lungs 2 (two) times daily. Rinse mouth after use   LORazepam 0.5 MG tablet Commonly known as:  ATIVAN Take 1 tablet (0.5 mg total) by mouth daily as needed for anxiety.   losartan 50 MG tablet Commonly known as:  COZAAR Take 1 tablet (50 mg total) by mouth daily.   multivitamin with minerals Tabs tablet Take 1 tablet by mouth daily.   OVER THE COUNTER MEDICATION Take 1 capsule by mouth daily.   pantoprazole 20 MG tablet Commonly known as:  PROTONIX Take 1 tablet (20 mg total) by mouth daily. 30 minutes before food   sertraline 50 MG tablet Commonly known as:  ZOLOFT Take 1.5 tablets (75 mg total) by mouth daily. In am   vitamin B-12 1000 MCG tablet Commonly known as:  CYANOCOBALAMIN Take 1,000 mcg by mouth daily.         Today   CHIEF COMPLAINT:  Denies abdominal pain or nausea.   VITAL SIGNS:  Blood pressure 134/89, pulse 74, temperature 98.6 F (37 C), temperature source Oral, resp. rate 20, height 5\' 9"  (1.753 m), weight 90.7 kg, SpO2 98 %.   REVIEW OF SYSTEMS:  Review of Systems  Constitutional: Negative.  Negative for chills, fever and malaise/fatigue.  HENT: Negative.  Negative for ear discharge, ear pain, hearing loss, nosebleeds and sore throat.   Eyes: Negative.  Negative for blurred vision and pain.  Respiratory: Negative.  Negative for cough, hemoptysis, shortness of breath and wheezing.   Cardiovascular: Negative.  Negative for chest pain, palpitations and leg swelling.  Gastrointestinal: Negative.  Negative for abdominal pain, blood in stool, diarrhea, nausea and vomiting.  Genitourinary: Negative.  Negative for dysuria.  Musculoskeletal: Negative.   Negative for back pain.  Skin: Negative.   Neurological: Negative for dizziness, tremors, speech change, focal weakness, seizures and headaches.  Endo/Heme/Allergies: Negative.  Does not bruise/bleed easily.  Psychiatric/Behavioral: Negative.  Negative for depression, hallucinations and suicidal ideas.     PHYSICAL EXAMINATION:  GENERAL:  52 y.o.-year-old patient lying in the bed with no acute distress.  NECK:  Supple, no jugular venous distention. No thyroid enlargement, no tenderness.  LUNGS: Normal breath sounds bilaterally, no wheezing, rales,rhonchi  No use of accessory muscles of respiration.  CARDIOVASCULAR: S1, S2 normal. No murmurs, rubs, or gallops.  ABDOMEN: Soft, non-tender, non-distended. Bowel sounds present. No organomegaly or mass.  EXTREMITIES: No pedal edema, cyanosis, or clubbing.  PSYCHIATRIC: The patient is alert and oriented x 3.  SKIN: No obvious rash, lesion, or ulcer.   DATA REVIEW:   CBC Recent Labs  Lab 03/03/18 0407  WBC 8.5  HGB 12.8*  HCT 37.9*  PLT 128*    Chemistries  Recent Labs  Lab 03/03/18 0407  NA 141  K 2.9*  CL 104  CO2 29  GLUCOSE 108*  BUN 9  CREATININE 0.51*  CALCIUM 8.2*  MG 1.9  AST 50*  ALT 34  ALKPHOS 99  BILITOT 1.1    Cardiac Enzymes Recent Labs  Lab 03/01/18 2213  TROPONINI <0.03    Microbiology Results  @[email or web address] @  RADIOLOGY:  Ct Abdomen Pelvis W Contrast  Result Date: 03/02/2018 CLINICAL DATA:  Abdominal pain. EXAM: CT ABDOMEN AND PELVIS WITH CONTRAST TECHNIQUE: Multidetector CT imaging of the abdomen and pelvis was performed using the standard protocol following bolus administration of intravenous contrast. CONTRAST:  181mL ISOVUE-300 IOPAMIDOL (ISOVUE-300) INJECTION 61% COMPARISON:  October 22, 2009 FINDINGS: Lower chest: No acute abnormality. Hepatobiliary: Decreased attenuation throughout the liver, somewhat heterogeneous with more focal low attenuation near the dome is likely heterogeneous hepatic  steatosis. No focal mass noted. Portal vein is patent. Patient is status post cholecystectomy. Pancreas: Peripancreatic stranding is identified, particularly adjacent to the distal body. There is low attenuation along the inferior aspect of the pancreas in this region well seen on coronal image 43, measuring up to 2.1 cm. This low-attenuation exerts mass effect on the splenic vein with narrowing but without complete occlusion or thrombosis. There is a calcification in the proximal pancreatic body which is new, in close proximity to the pancreatic duct. Calcifications in the head of the pancreas are stable, consistent with previous pancreatitis. Spleen: Normal in size without focal abnormality. Adrenals/Urinary Tract: There is a low-density nodule in the left adrenal gland consistent with an adenoma, also present previously. The right adrenal gland is normal. Probable cysts in the left kidney, too small to completely characterize. No suspicious masses, hydronephrosis, or perinephric stranding. The ureters and bladder are normal. Stomach/Bowel: Stomach and small bowel are normal. The colon and appendix are normal. Vascular/Lymphatic: The abdominal aorta is unremarkable. Prominent lymph nodes in the mesentery and retroperitoneum are likely reactive. Reproductive: Prostate is unremarkable. Other: No abdominal wall hernia or abnormality. No abdominopelvic ascites. Musculoskeletal: No acute or significant osseous  findings. IMPRESSION: 1. Pancreatitis. 2. The 2.1 cm low-attenuation mass along the inferior aspect of the pancreas is likely a pseudocyst exerting mass effect on the adjacent splenic vein. The splenic vein is narrowed but not thrombosed or completely occluded. 3. The calcification in the pancreatic body is in close proximity to the pancreatic duct. 4. Left adrenal adenoma. 5. Atherosclerotic change in the abdominal aorta. Electronically Signed   By: Dorise Bullion III M.D   On: 03/02/2018 00:06   Dg Chest  Port 1 View  Result Date: 03/01/2018 CLINICAL DATA:  52 y/o M; lower abdominal pain, nausea, vomiting, diarrhea. EXAM: PORTABLE CHEST 1 VIEW COMPARISON:  01/29/2017 CT chest and chest radiograph. FINDINGS: Stable heart size and mediastinal contours are within normal limits. Both lungs are clear. The visualized skeletal structures are unremarkable. IMPRESSION: No active disease. Electronically Signed   By: Kristine Garbe M.D.   On: 03/01/2018 22:36      Allergies as of 03/03/2018   No Known Allergies     Medication List    TAKE these medications   ALEVE 220 MG Caps Generic drug:  Naproxen Sodium Take 1 capsule by mouth daily as needed (workplace pain).   aspirin 81 MG chewable tablet Chew 81 mg by mouth daily.   budesonide-formoterol 160-4.5 MCG/ACT inhaler Commonly known as:  SYMBICORT Inhale 2 puffs into the lungs 2 (two) times daily. Rinse mouth after use   LORazepam 0.5 MG tablet Commonly known as:  ATIVAN Take 1 tablet (0.5 mg total) by mouth daily as needed for anxiety.   losartan 50 MG tablet Commonly known as:  COZAAR Take 1 tablet (50 mg total) by mouth daily.   multivitamin with minerals Tabs tablet Take 1 tablet by mouth daily.   OVER THE COUNTER MEDICATION Take 1 capsule by mouth daily.   pantoprazole 20 MG tablet Commonly known as:  PROTONIX Take 1 tablet (20 mg total) by mouth daily. 30 minutes before food   sertraline 50 MG tablet Commonly known as:  ZOLOFT Take 1.5 tablets (75 mg total) by mouth daily. In am   vitamin B-12 1000 MCG tablet Commonly known as:  CYANOCOBALAMIN Take 1,000 mcg by mouth daily.          Management plans discussed with the patient and he is in agreement. Stable for discharge   Patient should follow up with pcp  CODE STATUS:     Code Status Orders  (From admission, onward)         Start     Ordered   03/02/18 0322  Full code  Continuous     03/02/18 0321        Code Status History    This  patient has a current code status but no historical code status.      TOTAL TIME TAKING CARE OF THIS PATIENT: 38 minutes.    Note: This dictation was prepared with Dragon dictation along with smaller phrase technology. Any transcriptional errors that result from this process are unintentional.  Jawana Reagor M.D on 03/03/2018 at 10:13 AM  Between 7am to 6pm - Pager - [phone] After 6pm go to [email or web address] - password [email or web address] [facility]  Office  [phone]  CC: Primary care physician; McLean-Scocuzza, Nino Glow, MD

## 2018-03-03 NOTE — Progress Notes (Signed)
Pt discharged home with wife. Escorted to vehicle in wheelchair accompanied by nursing staff.

## 2018-03-03 NOTE — Progress Notes (Addendum)
PHARMACY CONSULT NOTE - FOLLOW UP  Pharmacy Consult for Electrolyte Monitoring and Replacement   Recent Labs: Potassium (mmol/L)  Date Value  03/03/2018 2.9 (L)   Magnesium (mg/dL)  Date Value  03/03/2018 1.9   Calcium (mg/dL)  Date Value  03/03/2018 8.2 (L)   Albumin (g/dL)  Date Value  03/03/2018 3.1 (L)    Assessment: 52 year old male admitted with pancreatitis. Potassium 2.1 on admission, patient received potassium 10 mEq IV x 4 and potassium increased to 3. Mg at that time was 1.4. Patient received Mg 2 g IV x 1 and NS w/ 40 mEq K started.  Goal of Therapy:  Will replace potassium for goal ~ 3.5 - 4 and Mg ~ 2.  Plan:  K 2.9, Mg 1.9. Patient receiving potassium 10 mEq IV x 4. Will order an additional 40 mEq PO to be given this morning. Continue NS w/ 40 mEq K.  Will check potassium and phosphorus at 1600.  Pharmacy to continue to follow patient and replace electrolytes per consult.  Tawnya Crook, PharmD Pharmacy Resident  03/03/2018 10:16 AM

## 2018-03-06 LAB — CULTURE, BLOOD (ROUTINE X 2)
CULTURE: NO GROWTH
Culture: NO GROWTH
Special Requests: ADEQUATE

## 2018-05-22 ENCOUNTER — Other Ambulatory Visit (INDEPENDENT_AMBULATORY_CARE_PROVIDER_SITE_OTHER): Payer: BLUE CROSS/BLUE SHIELD

## 2018-05-22 ENCOUNTER — Other Ambulatory Visit: Payer: Self-pay | Admitting: Internal Medicine

## 2018-05-22 DIAGNOSIS — E559 Vitamin D deficiency, unspecified: Secondary | ICD-10-CM | POA: Diagnosis not present

## 2018-05-22 DIAGNOSIS — D7589 Other specified diseases of blood and blood-forming organs: Secondary | ICD-10-CM

## 2018-05-22 DIAGNOSIS — K760 Fatty (change of) liver, not elsewhere classified: Secondary | ICD-10-CM | POA: Diagnosis not present

## 2018-05-22 DIAGNOSIS — E785 Hyperlipidemia, unspecified: Secondary | ICD-10-CM

## 2018-05-22 DIAGNOSIS — I10 Essential (primary) hypertension: Secondary | ICD-10-CM | POA: Diagnosis not present

## 2018-05-22 LAB — CBC WITH DIFFERENTIAL/PLATELET
BASOS ABS: 0.1 10*3/uL (ref 0.0–0.1)
Basophils Relative: 0.8 % (ref 0.0–3.0)
EOS ABS: 0.2 10*3/uL (ref 0.0–0.7)
Eosinophils Relative: 2.5 % (ref 0.0–5.0)
HEMATOCRIT: 48 % (ref 39.0–52.0)
Hemoglobin: 16.2 g/dL (ref 13.0–17.0)
LYMPHS PCT: 21 % (ref 12.0–46.0)
Lymphs Abs: 1.4 10*3/uL (ref 0.7–4.0)
MCHC: 33.7 g/dL (ref 30.0–36.0)
MCV: 109.5 fl — ABNORMAL HIGH (ref 78.0–100.0)
MONO ABS: 0.5 10*3/uL (ref 0.1–1.0)
Monocytes Relative: 7.6 % (ref 3.0–12.0)
NEUTROS ABS: 4.7 10*3/uL (ref 1.4–7.7)
Neutrophils Relative %: 68.1 % (ref 43.0–77.0)
Platelets: 183 10*3/uL (ref 150.0–400.0)
RBC: 4.39 Mil/uL (ref 4.22–5.81)
RDW: 14.9 % (ref 11.5–15.5)
WBC: 6.9 10*3/uL (ref 4.0–10.5)

## 2018-05-22 LAB — COMPREHENSIVE METABOLIC PANEL
ALT: 35 U/L (ref 0–53)
AST: 56 U/L — AB (ref 0–37)
Albumin: 4.5 g/dL (ref 3.5–5.2)
Alkaline Phosphatase: 104 U/L (ref 39–117)
BILIRUBIN TOTAL: 0.6 mg/dL (ref 0.2–1.2)
BUN: 11 mg/dL (ref 6–23)
CHLORIDE: 105 meq/L (ref 96–112)
CO2: 27 mEq/L (ref 19–32)
CREATININE: 0.61 mg/dL (ref 0.40–1.50)
Calcium: 10.4 mg/dL (ref 8.4–10.5)
GFR: 139.51 mL/min (ref >60.00)
GLUCOSE: 101 mg/dL — AB (ref 70–99)
Potassium: 4 mEq/L (ref 3.5–5.1)
SODIUM: 145 meq/L (ref 135–145)
Total Protein: 7.1 g/dL (ref 6.0–8.3)

## 2018-05-22 LAB — LIPID PANEL
CHOL/HDL RATIO: 3
Cholesterol: 206 mg/dL — ABNORMAL HIGH (ref 0–200)
HDL: 62.6 mg/dL (ref >39.00)
LDL CALC: 105 mg/dL — AB (ref 0–99)
NonHDL: 143.88
TRIGLYCERIDES: 192 mg/dL — AB (ref 0.0–149.0)
VLDL: 38.4 mg/dL (ref 0.0–40.0)

## 2018-05-22 LAB — VITAMIN D 25 HYDROXY (VIT D DEFICIENCY, FRACTURES): VITD: 60.82 ng/mL (ref 30.00–100.00)

## 2018-05-23 ENCOUNTER — Other Ambulatory Visit (INDEPENDENT_AMBULATORY_CARE_PROVIDER_SITE_OTHER): Payer: BLUE CROSS/BLUE SHIELD

## 2018-05-23 DIAGNOSIS — D7589 Other specified diseases of blood and blood-forming organs: Secondary | ICD-10-CM

## 2018-05-23 LAB — FOLATE: FOLATE: 9.1 ng/mL (ref >5.9)

## 2018-05-23 LAB — VITAMIN B12: VITAMIN B 12: 373 pg/mL (ref 211–911)

## 2018-05-24 ENCOUNTER — Other Ambulatory Visit: Payer: Self-pay | Admitting: Internal Medicine

## 2018-05-24 DIAGNOSIS — I10 Essential (primary) hypertension: Secondary | ICD-10-CM

## 2018-05-24 MED ORDER — LOSARTAN POTASSIUM 50 MG PO TABS: 50.0000 mg | ORAL_TABLET | Freq: Every day | ORAL | 3 refills | Status: DC

## 2018-05-25 ENCOUNTER — Other Ambulatory Visit: Payer: Self-pay | Admitting: Internal Medicine

## 2018-05-25 DIAGNOSIS — E785 Hyperlipidemia, unspecified: Secondary | ICD-10-CM

## 2018-05-25 MED ORDER — PRAVASTATIN SODIUM 20 MG PO TABS: 20.0000 mg | ORAL_TABLET | Freq: Every day | ORAL | 3 refills | Status: DC

## 2018-05-29 ENCOUNTER — Other Ambulatory Visit: Payer: Self-pay

## 2018-05-29 ENCOUNTER — Encounter: Payer: Self-pay | Admitting: Internal Medicine

## 2018-05-29 ENCOUNTER — Ambulatory Visit: Payer: BLUE CROSS/BLUE SHIELD | Admitting: Internal Medicine

## 2018-05-29 VITALS — BP 118/78 | HR 89 | Temp 98.1°F | Ht 69.0 in | Wt 195.2 lb

## 2018-05-29 DIAGNOSIS — I10 Essential (primary) hypertension: Secondary | ICD-10-CM

## 2018-05-29 DIAGNOSIS — H6123 Impacted cerumen, bilateral: Secondary | ICD-10-CM | POA: Diagnosis not present

## 2018-05-29 DIAGNOSIS — F329 Major depressive disorder, single episode, unspecified: Secondary | ICD-10-CM

## 2018-05-29 DIAGNOSIS — R739 Hyperglycemia, unspecified: Secondary | ICD-10-CM

## 2018-05-29 DIAGNOSIS — F419 Anxiety disorder, unspecified: Secondary | ICD-10-CM | POA: Diagnosis not present

## 2018-05-29 DIAGNOSIS — E785 Hyperlipidemia, unspecified: Secondary | ICD-10-CM

## 2018-05-29 DIAGNOSIS — F32A Depression, unspecified: Secondary | ICD-10-CM

## 2018-05-29 DIAGNOSIS — J449 Chronic obstructive pulmonary disease, unspecified: Secondary | ICD-10-CM

## 2018-05-29 DIAGNOSIS — Z23 Encounter for immunization: Secondary | ICD-10-CM | POA: Diagnosis not present

## 2018-05-29 DIAGNOSIS — Z1329 Encounter for screening for other suspected endocrine disorder: Secondary | ICD-10-CM

## 2018-05-29 DIAGNOSIS — K760 Fatty (change of) liver, not elsewhere classified: Secondary | ICD-10-CM | POA: Diagnosis not present

## 2018-05-29 DIAGNOSIS — Z125 Encounter for screening for malignant neoplasm of prostate: Secondary | ICD-10-CM

## 2018-05-29 NOTE — Progress Notes (Signed)
Pre visit review using our clinic review tool, if applicable. No additional management support is needed unless otherwise documented below in the visit note. 

## 2018-05-29 NOTE — Progress Notes (Signed)
Chief Complaint  Patient presents with  . Follow-up   F/u  1. HTN controlled on losartan 50 mg qd  2. Mood improved on zoloft 75 mg qd he has changed positions at his job which helped dad is terminally ill which is stressor but doing better with coping with this on medication  3. Likely COPD improved with symbicort  4. Fatty liver wants to get twinrix today he has lost weight from 206 to 195 lbs today eating better  5. B/l ear wax in ears wants ears cleaned    Review of Systems  Constitutional: Positive for weight loss.  HENT: Positive for hearing loss.        Wax in ears    Eyes: Negative for blurred vision.  Respiratory: Negative for shortness of breath.   Cardiovascular: Negative for chest pain.  Gastrointestinal: Negative for abdominal pain.  Musculoskeletal: Negative for falls.  Skin: Negative for rash.  Neurological: Negative for headaches.  Psychiatric/Behavioral: Negative for depression.   Past Medical History:  Diagnosis Date  . Abnormal EKG   . Allergy    Seasonal  . Anxiety   . Chicken pox   . Depression   . GERD (gastroesophageal reflux disease)   . Hyperlipidemia   . Hypertension   . Urine incontinence    Past Surgical History:  Procedure Laterality Date  . CHOLECYSTECTOMY  2000  . LEFT HEART CATH AND CORONARY ANGIOGRAPHY N/A 01/31/2017   Procedure: LEFT HEART CATH AND CORONARY ANGIOGRAPHY;  Surgeon: Dionisio David, MD;  Location: [facility];  Service: Cardiovascular;  Laterality: N/A;   Family History  Problem Relation Age of Onset  . Alcohol abuse Father   . Diabetes Father   . Cancer Father        ?stage IV thyroid with met  . Cancer Maternal Grandfather        Colon Cancer   Social History   Socioeconomic History  . Marital status: Married    Spouse name: Not on file  . Number of children: Not on file  . Years of education: Not on file  . Highest education level: Not on file  Occupational History  . Not on file  Social Needs   . Financial resource strain: Not on file  . Food insecurity:    Worry: Not on file    Inability: Not on file  . Transportation needs:    Medical: Not on file    Non-medical: Not on file  Tobacco Use  . Smoking status: Current Every Day Smoker    Packs/day: 1.00    Types: Cigarettes  . Smokeless tobacco: Never Used  Substance and Sexual Activity  . Alcohol use: Yes    Alcohol/week: 21.0 standard drinks    Types: 21 Shots of liquor per week    Comment: 1 pt will last 1 week   . Drug use: No  . Sexual activity: Yes    Partners: Female  Lifestyle  . Physical activity:    Days per week: Not on file    Minutes per session: Not on file  . Stress: Not on file  Relationships  . Social connections:    Talks on phone: Not on file    Gets together: Not on file    Attends religious service: Not on file    Active member of club or organization: Not on file    Attends meetings of clubs or organizations: Not on file    Relationship status: Not on file  .  Intimate partner violence:    Fear of current or ex partner: Not on file    Emotionally abused: Not on file    Physically abused: Not on file    Forced sexual activity: Not on file  Other Topics Concern  . 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 aspirin 81 MG chewable tablet Chew 81 mg by mouth daily.   . budesonide-formoterol (SYMBICORT) 160-4.5 MCG/ACT inhaler Inhale 2 puffs into the lungs 2 (two) times daily. Rinse mouth after use  . LORazepam (ATIVAN) 0.5 MG tablet Take 1 tablet (0.5 mg total) by mouth daily as needed for anxiety.  Marland Kitchen losartan (COZAAR) 50 MG tablet Take 1 tablet (50 mg total) by mouth daily.  . Multiple Vitamin (MULTIVITAMIN WITH MINERALS) TABS tablet Take 1 tablet by mouth daily.  . Naproxen Sodium (ALEVE) 220 MG CAPS Take 1  capsule by mouth daily as needed (workplace pain).   Marland Kitchen OVER THE COUNTER MEDICATION Take 1 capsule by mouth daily.  . pantoprazole (PROTONIX) 20 MG tablet Take 1 tablet (20 mg total) by mouth daily. 30 minutes before food  . pravastatin (PRAVACHOL) 20 MG tablet Take 1 tablet (20 mg total) by mouth at bedtime.  . sertraline (ZOLOFT) 50 MG tablet Take 1.5 tablets (75 mg total) by mouth daily. In am  . vitamin B-12 (CYANOCOBALAMIN) 1000 MCG tablet Take 1,000 mcg by mouth daily.    No Known Allergies Recent Results (from the past 2160 hour(s))  Comprehensive metabolic panel     Status: Abnormal   Collection Time: 03/01/18 10:13 PM  Result Value Ref Range   Sodium 138 135 - 145 mmol/L   Potassium 2.1 (LL) 3.5 - 5.1 mmol/L    Comment: CRITICAL RESULT CALLED TO, READ BACK BY AND VERIFIED WITH JEN BAILEY 03/01/18 2240 JML    Chloride 89 (L) 98 - 111 mmol/L   CO2 37 (H) 22 - 32 mmol/L   Glucose, Bld 131 (H) 70 - 99 mg/dL   BUN 6 6 - 20 mg/dL   Creatinine, Ser 0.68 0.61 - 1.24 mg/dL   Calcium 10.4 (H) 8.9 - 10.3 mg/dL   Total Protein 7.6 6.5 - 8.1 g/dL   Albumin 4.4 3.5 - 5.0 g/dL   AST 75 (H) 15 - 41 U/L   ALT 50 (H) 0 - 44 U/L   Alkaline Phosphatase 142 (H) 38 - 126 U/L   Total Bilirubin 1.1 0.3 - 1.2 mg/dL   GFR calc non Af Amer >60 >60 mL/min   GFR calc Af Amer >60 >60 mL/min    Comment: (NOTE) The eGFR has been calculated using the CKD EPI equation. This calculation has not been validated in all clinical situations. eGFR's persistently <60 mL/min signify possible Chronic Kidney Disease.    Anion gap 12 5 - 15    Comment: Performed at [facility], [address] [place], [place] [address]  CBC with Differential     Status: Abnormal   Collection Time: 03/01/18 10:13 PM  Result Value Ref Range   WBC 12.6 (H) 4.0 - 10.5 K/uL   RBC 4.37 4.22 - 5.81 MIL/uL   Hemoglobin 16.5 13.0 - 17.0 g/dL   HCT 46.5 39.0 - 52.0 %   MCV 106.4 (H) 80.0 - 100.0  fL   MCH 37.8 (H) 26.0 -  34.0 pg   MCHC 35.5 30.0 - 36.0 g/dL   RDW 12.7 11.5 - 15.5 %   Platelets 190 150 - 400 K/uL   nRBC 0.0 0.0 - 0.2 %   Neutrophils Relative % 70 %   Neutro Abs 9.0 (H) 1.7 - 7.7 K/uL   Lymphocytes Relative 18 %   Lymphs Abs 2.2 0.7 - 4.0 K/uL   Monocytes Relative 9 %   Monocytes Absolute 1.1 (H) 0.1 - 1.0 K/uL   Eosinophils Relative 1 %   Eosinophils Absolute 0.2 0.0 - 0.5 K/uL   Basophils Relative 1 %   Basophils Absolute 0.1 0.0 - 0.1 K/uL   Immature Granulocytes 1 %   Abs Immature Granulocytes 0.06 0.00 - 0.07 K/uL    Comment: Performed at [facility], [address], [place], [place] [address]  Protime-INR     Status: None   Collection Time: 03/01/18 10:13 PM  Result Value Ref Range   Prothrombin Time 12.4 11.4 - 15.2 seconds   INR 0.93     Comment: Performed at [facility], [address] [place], [place] [address]  Culture, blood (Routine x 2)     Status: None   Collection Time: 03/01/18 10:13 PM  Result Value Ref Range   Specimen Description BLOOD LEFT ANTECUBITAL    Special Requests      BOTTLES DRAWN AEROBIC AND ANAEROBIC Blood Culture results may not be optimal due to an excessive volume of blood received in culture bottles   Culture      NO GROWTH 5 DAYS Performed at [facility], [address] [place], [place] [address]    Report Status 03/06/2018 FINAL   Culture, blood (Routine x 2)     Status: None   Collection Time: 03/01/18 10:13 PM  Result Value Ref Range   Specimen Description BLOOD RIGHT ANTECUBITAL    Special Requests      BOTTLES DRAWN AEROBIC AND ANAEROBIC Blood Culture adequate volume   Culture      NO GROWTH 5 DAYS Performed at [facility], [address] [place], [place] [address]    Report Status 03/06/2018 FINAL   Urinalysis, Complete w Microscopic     Status: Abnormal   Collection Time: 03/01/18 10:13 PM  Result Value Ref Range   Color, Urine YELLOW (A) YELLOW   APPearance CLEAR (A) CLEAR    Specific Gravity, Urine 1.036 (H) 1.005 - 1.030   pH 7.0 5.0 - 8.0   Glucose, UA NEGATIVE NEGATIVE mg/dL   Hgb urine dipstick MODERATE (A) NEGATIVE   Bilirubin Urine NEGATIVE NEGATIVE   Ketones, ur NEGATIVE NEGATIVE mg/dL   Protein, ur NEGATIVE NEGATIVE mg/dL   Nitrite NEGATIVE NEGATIVE   Leukocytes, UA NEGATIVE NEGATIVE   RBC / HPF 0-5 0 - 5 RBC/hpf   WBC, UA 0-5 0 - 5 WBC/hpf   Bacteria, UA NONE SEEN NONE SEEN   Squamous Epithelial / LPF 0-5 0 - 5    Comment: Performed at [facility], [address] [place], [place] [address]  Lipase, blood     Status: Abnormal   Collection Time: 03/01/18 10:13 PM  Result Value Ref Range   Lipase 127 (H) 11 - 51 U/L    Comment: Performed at [facility], [address] [place], [place] [address]  Troponin I - Add-On to previous collection     Status: None   Collection Time: 03/01/18 10:13  PM  Result Value Ref Range   Troponin I <0.03 <0.03 ng/mL    Comment: Performed at [facility], [address] [place], [place] [address]  TSH     Status: None   Collection Time: 03/01/18 10:13 PM  Result Value Ref Range   TSH 2.626 0.350 - 4.500 uIU/mL    Comment: Performed by a 3rd Generation assay with a functional sensitivity of <=0.01 uIU/mL. Performed at [facility], [address] [place], [place] [address]   Hemoglobin A1c     Status: Abnormal   Collection Time: 03/01/18 10:13 PM  Result Value Ref Range   Hgb A1c MFr Bld 4.7 (L) 4.8 - 5.6 %    Comment: (NOTE) Pre diabetes:          5.7%-6.4% Diabetes:              >6.4% Glycemic control for   <7.0% adults with diabetes    Mean Plasma Glucose 88.19 mg/dL    Comment: Performed at [facility] [address] [place], [place] [address]  Lactic acid, plasma     Status: Abnormal   Collection Time: 03/01/18 10:14 PM  Result Value Ref Range   Lactic Acid, Venous 2.1 (HH) 0.5 - 1.9 mmol/L    Comment: CRITICAL RESULT CALLED TO, READ BACK BY AND  VERIFIED WITH SHERRI ALISON 03/01/18 2251 JML Performed at [facility], [address] [place], [place] [address]   CG4 I-STAT (Lactic acid)     Status: Abnormal   Collection Time: 03/01/18 10:25 PM  Result Value Ref Range   Lactic Acid, Venous 2.16 (HH) 0.5 - 1.9 mmol/L   Comment MD NOTIFIED, REPEAT TEST   Lactic acid, plasma     Status: Abnormal   Collection Time: 03/02/18  2:46 AM  Result Value Ref Range   Lactic Acid, Venous 2.4 (HH) 0.5 - 1.9 mmol/L    Comment: CRITICAL RESULT CALLED TO, READ BACK BY AND VERIFIED WITH JENNIFER DALEY ON 03/02/18 AT 0315 JAG Performed at [facility], [address] [place], [place] [address]   Magnesium     Status: Abnormal   Collection Time: 03/02/18 12:09 PM  Result Value Ref Range   Magnesium 1.4 (L) 1.7 - 2.4 mg/dL    Comment: Performed at [facility], [address] [place], [place] [address]  Potassium     Status: Abnormal   Collection Time: 03/02/18 12:09 PM  Result Value Ref Range   Potassium 3.0 (L) 3.5 - 5.1 mmol/L    Comment: Performed at [facility], [address] [place], [place] [address]  Comprehensive metabolic panel     Status: Abnormal   Collection Time: 03/03/18  4:07 AM  Result Value Ref Range   Sodium 141 135 - 145 mmol/L   Potassium 2.9 (L) 3.5 - 5.1 mmol/L   Chloride 104 98 - 111 mmol/L   CO2 29 22 - 32 mmol/L   Glucose, Bld 108 (H) 70 - 99 mg/dL   BUN 9 6 - 20 mg/dL   Creatinine, Ser 0.51 (L) 0.61 - 1.24 mg/dL   Calcium 8.2 (L) 8.9 - 10.3 mg/dL   Total Protein 5.6 (L) 6.5 - 8.1 g/dL   Albumin 3.1 (L) 3.5 - 5.0 g/dL   AST 50 (H) 15 - 41 U/L   ALT 34 0 - 44 U/L   Alkaline Phosphatase 99 38 - 126 U/L   Total Bilirubin 1.1 0.3 - 1.2 mg/dL  GFR calc non Af Amer >60 >60 mL/min   GFR calc Af Amer >60 >60 mL/min    Comment: (NOTE) The eGFR has been calculated using the CKD EPI equation. This calculation has not been validated in all clinical situations. eGFR's  persistently <60 mL/min signify possible Chronic Kidney Disease.    Anion gap 8 5 - 15    Comment: Performed at [facility], [address] [place], [place] [address]  CBC     Status: Abnormal   Collection Time: 03/03/18  4:07 AM  Result Value Ref Range   WBC 8.5 4.0 - 10.5 K/uL   RBC 3.44 (L) 4.22 - 5.81 MIL/uL   Hemoglobin 12.8 (L) 13.0 - 17.0 g/dL   HCT 37.9 (L) 39.0 - 52.0 %   MCV 110.2 (H) 80.0 - 100.0 fL   MCH 37.2 (H) 26.0 - 34.0 pg   MCHC 33.8 30.0 - 36.0 g/dL   RDW 12.7 11.5 - 15.5 %   Platelets 128 (L) 150 - 400 K/uL    Comment: Immature Platelet Fraction may be clinically indicated, consider ordering this additional test [id number]    nRBC 0.0 0.0 - 0.2 %    Comment: Performed at [facility], [address] [place], [place] [address]  Lipase, blood     Status: Abnormal   Collection Time: 03/03/18  4:07 AM  Result Value Ref Range   Lipase 91 (H) 11 - 51 U/L    Comment: Performed at [facility], [address] [place], [place] [address]  Magnesium     Status: None   Collection Time: 03/03/18  4:07 AM  Result Value Ref Range   Magnesium 1.9 1.7 - 2.4 mg/dL    Comment: Performed at [facility], [address] [place], [place] [address]  Vitamin D (25 hydroxy)     Status: None   Collection Time: 05/22/18  8:00 AM  Result Value Ref Range   VITD 60.82 30.00 - 100.00 ng/mL  Lipid panel     Status: Abnormal   Collection Time: 05/22/18  8:00 AM  Result Value Ref Range   Cholesterol 206 (H) 0 - 200 mg/dL    Comment: ATP III Classification       Desirable:  < 200 mg/dL               Borderline High:  200 - 239 mg/dL          High:  > = 240 mg/dL   Triglycerides 192.0 (H) 0.0 - 149.0 mg/dL    Comment: Normal:  <150 mg/dLBorderline High:  150 - 199 mg/dL   HDL 62.60 >39.00 mg/dL   VLDL 38.4 0.0 - 40.0 mg/dL   LDL Cholesterol 105 (H) 0 - 99 mg/dL   Total CHOL/HDL Ratio 3     Comment:                Men          Women1/2  Average Risk     3.4          3.3Average Risk          5.0          4.42X Average Risk          9.6          7.13X Average Risk          15.0          11.0  NonHDL 143.88     Comment: NOTE:  Non-HDL goal should be 30 mg/dL higher than patient's LDL goal (i.e. LDL goal of < 70 mg/dL, would have non-HDL goal of < 100 mg/dL)  CBC with Differential/Platelet     Status: Abnormal   Collection Time: 05/22/18  8:00 AM  Result Value Ref Range   WBC 6.9 4.0 - 10.5 K/uL   RBC 4.39 4.22 - 5.81 Mil/uL   Hemoglobin 16.2 13.0 - 17.0 g/dL   HCT 48.0 39.0 - 52.0 %   MCV 109.5 (H) 78.0 - 100.0 fl   MCHC 33.7 30.0 - 36.0 g/dL   RDW 14.9 11.5 - 15.5 %   Platelets 183.0 150.0 - 400.0 K/uL   Neutrophils Relative % 68.1 43.0 - 77.0 %   Lymphocytes Relative 21.0 12.0 - 46.0 %   Monocytes Relative 7.6 3.0 - 12.0 %   Eosinophils Relative 2.5 0.0 - 5.0 %   Basophils Relative 0.8 0.0 - 3.0 %   Neutro Abs 4.7 1.4 - 7.7 K/uL   Lymphs Abs 1.4 0.7 - 4.0 K/uL   Monocytes Absolute 0.5 0.1 - 1.0 K/uL   Eosinophils Absolute 0.2 0.0 - 0.7 K/uL   Basophils Absolute 0.1 0.0 - 0.1 K/uL  Comprehensive metabolic panel     Status: Abnormal   Collection Time: 05/22/18  8:00 AM  Result Value Ref Range   Sodium 145 135 - 145 mEq/L   Potassium 4.0 3.5 - 5.1 mEq/L   Chloride 105 96 - 112 mEq/L   CO2 27 19 - 32 mEq/L   Glucose, Bld 101 (H) 70 - 99 mg/dL   BUN 11 6 - 23 mg/dL   Creatinine, Ser 0.61 0.40 - 1.50 mg/dL   Total Bilirubin 0.6 0.2 - 1.2 mg/dL   Alkaline Phosphatase 104 39 - 117 U/L   AST 56 (H) 0 - 37 U/L   ALT 35 0 - 53 U/L   Total Protein 7.1 6.0 - 8.3 g/dL   Albumin 4.5 3.5 - 5.2 g/dL   Calcium 10.4 8.4 - 10.5 mg/dL   GFR 139.51 >60.00 mL/min  B12     Status: None   Collection Time: 05/23/18 10:04 AM  Result Value Ref Range   Vitamin B-12 373 211 - 911 pg/mL  Folate     Status: None   Collection Time: 05/23/18 10:04 AM  Result Value Ref Range   Folate 9.1 >5.9 ng/mL   Objective   Body mass index is 28.83 kg/m. Wt Readings from Last 3 Encounters:  05/29/18 195 lb 3.2 oz (88.5 kg)  03/03/18 199 lb 15.3 oz (90.7 kg)  01/19/18 199 lb 12.8 oz (90.6 kg)   Temp Readings from Last 3 Encounters:  05/29/18 98.1 F (36.7 C) (Oral)  03/03/18 98.6 F (37 C) (Oral)  01/19/18 98.7 F (37.1 C) (Oral)   BP Readings from Last 3 Encounters:  05/29/18 118/78  03/03/18 134/89  01/19/18 136/84   Pulse Readings from Last 3 Encounters:  05/29/18 89  03/03/18 74  01/19/18 82    Physical Exam Vitals signs and nursing note reviewed.  Constitutional:      Appearance: Normal appearance. He is well-developed and well-groomed. He is obese.  HENT:     Head: Normocephalic and atraumatic.     Right Ear: There is impacted cerumen.     Left Ear: There is impacted cerumen.     Mouth/Throat:     Mouth: Mucous membranes are moist.     Pharynx: Oropharynx  is clear.  Eyes:     Conjunctiva/sclera: Conjunctivae normal.     Pupils: Pupils are equal, round, and reactive to light.  Cardiovascular:     Rate and Rhythm: Normal rate and regular rhythm.     Heart sounds: Normal heart sounds. No murmur.  Pulmonary:     Effort: Pulmonary effort is normal.     Breath sounds: Normal breath sounds.  Skin:    General: Skin is warm and dry.  Neurological:     General: No focal deficit present.     Mental Status: He is alert and oriented to person, place, and time. Mental status is at baseline.     Gait: Gait normal.  Psychiatric:        Attention and Perception: Attention and perception normal.        Mood and Affect: Mood and affect normal.        Speech: Speech normal.        Behavior: Behavior normal. Behavior is cooperative.        Thought Content: Thought content normal.        Cognition and Memory: Cognition and memory normal.        Judgment: Judgment normal.     Assessment   1. HTN/HLD  2. Anxiety/depression improved  3. COPD likely 2/2 smoking history  4. Fatty liver   5. HM 6. B/l cerumen impaction  Plan   1. Cont meds  Add pravachol 20 mg qhs  Check cmet, lipid in 6 months  Add pravastatin 20 mg qhs  2.  zoloft 75 mg qd helping  3. symbicort helping  4. Reduce weight, cholesterol  twinrix 1/3 today  5.  Declines flu shot, pna 23  Tdap UTD  twinrix 1/3 today   rec smoking cessation pt has cut back  Disc colonscopy pt wants to think about it  PSA due 09/20/2018  Check fasting labs in future  6. Ear lavage today b/l   Provider: Dr. Olivia Mackie McLean-Scocuzza-[facility]

## 2018-05-29 NOTE — Patient Instructions (Signed)
Try Debrox ear wax drops over the counter monthly x 4-7 days as needed for ear wax   Earwax Buildup, Adult The ears produce a substance called earwax that helps keep bacteria out of the ear and protects the skin in the ear canal. Occasionally, earwax can build up in the ear and cause discomfort or hearing loss. What increases the risk? This condition is more likely to develop in people who:  Are male.  Are elderly.  Naturally produce more earwax.  Clean their ears often with cotton swabs.  Use earplugs often.  Use in-ear headphones often.  Wear hearing aids.  Have narrow ear canals.  Have earwax that is overly thick or sticky.  Have eczema.  Are dehydrated.  Have excess hair in the ear canal. What are the signs or symptoms? Symptoms of this condition include:  Reduced or muffled hearing.  A feeling of fullness in the ear or feeling that the ear is plugged.  Fluid coming from the ear.  Ear pain.  Ear itch.  Ringing in the ear.  Coughing.  An obvious piece of earwax that can be seen inside the ear canal. How is this diagnosed? This condition may be diagnosed based on:  Your symptoms.  Your medical history.  An ear exam. During the exam, your health care provider will look into your ear with an instrument called an otoscope. You may have tests, including a hearing test. How is this treated? This condition may be treated by:  Using ear drops to soften the earwax.  Having the earwax removed by a health care provider. The health care provider may: ? Flush the ear with water. ? Use an instrument that has a loop on the end (curette). ? Use a suction device.  Surgery to remove the wax buildup. This may be done in severe cases. Follow these instructions at home:   Take over-the-counter and prescription medicines only as told by your health care provider.  Do not put any objects, including cotton swabs, into your ear. You can clean the opening of your ear  canal with a washcloth or facial tissue.  Follow instructions from your health care provider about cleaning your ears. Do not over-clean your ears.  Drink enough fluid to keep your urine clear or pale yellow. This will help to thin the earwax.  Keep all follow-up visits as told by your health care provider. If earwax builds up in your ears often or if you use hearing aids, consider seeing your health care provider for routine, preventive ear cleanings. Ask your health care provider how often you should schedule your cleanings.  If you have hearing aids, clean them according to instructions from the manufacturer and your health care provider. Contact a health care provider if:  You have ear pain.  You develop a fever.  You have blood, pus, or other fluid coming from your ear.  You have hearing loss.  You have ringing in your ears that does not go away.  Your symptoms do not improve with treatment.  You feel like the room is spinning (vertigo). Summary  Earwax can build up in the ear and cause discomfort or hearing loss.  The most common symptoms of this condition include reduced or muffled hearing and a feeling of fullness in the ear or feeling that the ear is plugged.  This condition may be diagnosed based on your symptoms, your medical history, and an ear exam.  This condition may be treated by using ear drops to  soften the earwax or by having the earwax removed by a health care provider.  Do not put any objects, including cotton swabs, into your ear. You can clean the opening of your ear canal with a washcloth or facial tissue. This information is not intended to replace advice given to you by your health care provider. Make sure you discuss any questions you have with your health care provider. Document Released: 05/12/2004 Document Revised: 03/16/2017 Document Reviewed: 06/15/2016 Elsevier Interactive Patient Education  2019 [employer].

## 2018-06-27 ENCOUNTER — Ambulatory Visit (INDEPENDENT_AMBULATORY_CARE_PROVIDER_SITE_OTHER): Payer: BLUE CROSS/BLUE SHIELD

## 2018-06-27 ENCOUNTER — Other Ambulatory Visit: Payer: Self-pay

## 2018-06-27 DIAGNOSIS — Z23 Encounter for immunization: Secondary | ICD-10-CM

## 2018-06-27 DIAGNOSIS — Z9229 Personal history of other drug therapy: Secondary | ICD-10-CM

## 2018-07-15 ENCOUNTER — Telehealth: Payer: BLUE CROSS/BLUE SHIELD | Admitting: Family

## 2018-07-15 DIAGNOSIS — J208 Acute bronchitis due to other specified organisms: Secondary | ICD-10-CM

## 2018-07-15 MED ORDER — PREDNISONE 10 MG (21) PO TBPK: ORAL_TABLET | ORAL | 0 refills | Status: DC

## 2018-07-15 NOTE — Progress Notes (Signed)

## 2018-07-16 ENCOUNTER — Ambulatory Visit: Payer: Self-pay | Admitting: Internal Medicine

## 2018-07-16 ENCOUNTER — Other Ambulatory Visit: Payer: Self-pay | Admitting: Specialist

## 2018-07-16 ENCOUNTER — Ambulatory Visit
Admission: RE | Admit: 2018-07-16 | Discharge: 2018-07-16 | Disposition: A | Discharge status: Home / Self Care | Payer: BLUE CROSS/BLUE SHIELD | Source: Ambulatory Visit | Attending: Diagnostic Radiology | Admitting: Diagnostic Radiology

## 2018-07-16 ENCOUNTER — Other Ambulatory Visit: Payer: Self-pay

## 2018-07-16 ENCOUNTER — Ambulatory Visit
Admission: RE | Admit: 2018-07-16 | Discharge: 2018-07-16 | Disposition: A | Discharge status: Home / Self Care | Payer: BLUE CROSS/BLUE SHIELD | Source: Ambulatory Visit | Attending: Specialist | Admitting: Specialist

## 2018-07-16 DIAGNOSIS — R0602 Shortness of breath: Secondary | ICD-10-CM

## 2018-07-16 DIAGNOSIS — J90 Pleural effusion, not elsewhere classified: Secondary | ICD-10-CM | POA: Diagnosis not present

## 2018-07-16 DIAGNOSIS — R6889 Other general symptoms and signs: Secondary | ICD-10-CM | POA: Diagnosis not present

## 2018-07-16 DIAGNOSIS — Z9889 Other specified postprocedural states: Secondary | ICD-10-CM

## 2018-07-16 DIAGNOSIS — R509 Fever, unspecified: Secondary | ICD-10-CM | POA: Diagnosis not present

## 2018-07-16 LAB — BODY FLUID CELL COUNT WITH DIFFERENTIAL
Eos, Fluid: 2 %
LYMPHS FL: 2 %
MONOCYTE-MACROPHAGE-SEROUS FLUID: 46 %
NEUTROPHIL FLUID: 50 %
Other Cells, Fluid: 0 %
WBC FLUID: 928 uL

## 2018-07-16 LAB — LACTATE DEHYDROGENASE, PLEURAL OR PERITONEAL FLUID: LD FL: 350 U/L — AB (ref 3–23)

## 2018-07-16 LAB — PROTEIN, PLEURAL OR PERITONEAL FLUID: TOTAL PROTEIN, FLUID: 4 g/dL

## 2018-07-16 NOTE — Telephone Encounter (Signed)
Agree with urgent care or ED  Not sure who started on steroids but we are using these with caution at the current time  Any patient with respiratory symptoms and fever we are not seeing in the clinic at this time  [place]

## 2018-07-16 NOTE — Telephone Encounter (Signed)
FYI

## 2018-07-16 NOTE — Telephone Encounter (Signed)
Pt. Reports started having shortness of breath this past Saturday with a fever. Had an e-visit and started on steroids. Shortness of breath continues. Fever broke last night - no fever this morning. Has wheezing now ,which is concerning pt. And wife. Wife concerned about waiting to be seen. Will go to UC. Reason for Disposition . [1] MILD difficulty breathing (e.g., minimal/no SOB at rest, SOB with walking, pulse <100) AND [2] NEW-onset or WORSE than normal  Answer Assessment - Initial Assessment Questions 1. RESPIRATORY STATUS: "Describe your breathing?" (e.g., wheezing, shortness of breath, unable to speak, severe coughing)      Shortness of breath 2. ONSET: "When did this breathing problem begin?"      Saturday 3. PATTERN "Does the difficult breathing come and go, or has it been constant since it started?"      With exertion 4. SEVERITY: "How bad is your breathing?" (e.g., mild, moderate, severe)    - MILD: No SOB at rest, mild SOB with walking, speaks normally in sentences, can lay down, no retractions, pulse < 100.    - MODERATE: SOB at rest, SOB with minimal exertion and prefers to sit, cannot lie down flat, speaks in phrases, mild retractions, audible wheezing, pulse 100-120.    - SEVERE: Very SOB at rest, speaks in single words, struggling to breathe, sitting hunched forward, retractions, pulse > 120      Moderate 5. RECURRENT SYMPTOM: "Have you had difficulty breathing before?" If so, ask: "When was the last time?" and "What happened that time?"      Yes 6. CARDIAC HISTORY: "Do you have any history of heart disease?" (e.g., heart attack, angina, bypass surgery, angioplasty)      Unsure 7. LUNG HISTORY: "Do you have any history of lung disease?"  (e.g., pulmonary embolus, asthma, emphysema)     no 8. CAUSE: "What do you think is causing the breathing problem?"      Unsure 9. OTHER SYMPTOMS: "Do you have any other symptoms? (e.g., dizziness, runny nose, cough, chest pain, fever)  Wheezing, cough 10. PREGNANCY: "Is there any chance you are pregnant?" "When was your last menstrual period?"       No 11. TRAVEL: "Have you traveled out of the country in the last month?" (e.g., travel history, exposures)       No  Protocols used: BREATHING DIFFICULTY-A-AH

## 2018-07-16 NOTE — Procedures (Signed)
Right US thoracentesis without difficulty  Complications:  None  Blood Loss: none  See dictation in canopy pacs

## 2018-07-17 LAB — ACID FAST SMEAR (AFB, MYCOBACTERIA)

## 2018-07-17 LAB — ACID FAST SMEAR (AFB): ACID FAST SMEAR - AFSCU2: NEGATIVE

## 2018-07-18 ENCOUNTER — Emergency Department: Payer: BLUE CROSS/BLUE SHIELD

## 2018-07-18 ENCOUNTER — Encounter: Payer: Self-pay | Admitting: Emergency Medicine

## 2018-07-18 ENCOUNTER — Other Ambulatory Visit: Payer: Self-pay

## 2018-07-18 ENCOUNTER — Emergency Department
Admission: EM | Admit: 2018-07-18 | Discharge: 2018-07-18 | Discharge status: Against Medical Advice | Payer: BLUE CROSS/BLUE SHIELD | Attending: Student in an Organized Health Care Education/Training Program | Admitting: Student in an Organized Health Care Education/Training Program

## 2018-07-18 DIAGNOSIS — Z79899 Other long term (current) drug therapy: Secondary | ICD-10-CM | POA: Diagnosis not present

## 2018-07-18 DIAGNOSIS — Z7982 Long term (current) use of aspirin: Secondary | ICD-10-CM | POA: Diagnosis not present

## 2018-07-18 DIAGNOSIS — I10 Essential (primary) hypertension: Secondary | ICD-10-CM | POA: Insufficient documentation

## 2018-07-18 DIAGNOSIS — F17210 Nicotine dependence, cigarettes, uncomplicated: Secondary | ICD-10-CM | POA: Insufficient documentation

## 2018-07-18 DIAGNOSIS — R05 Cough: Secondary | ICD-10-CM | POA: Diagnosis not present

## 2018-07-18 DIAGNOSIS — R0602 Shortness of breath: Secondary | ICD-10-CM

## 2018-07-18 LAB — CYTOLOGY - NON PAP

## 2018-07-18 MED ORDER — ALBUTEROL SULFATE HFA 108 (90 BASE) MCG/ACT IN AERS: 2.0000 | INHALATION_SPRAY | Freq: Once | RESPIRATORY_TRACT | Status: DC

## 2018-07-18 MED ORDER — ALBUTEROL SULFATE (2.5 MG/3ML) 0.083% IN NEBU: 5.0000 mg | INHALATION_SOLUTION | Freq: Once | RESPIRATORY_TRACT | Status: DC

## 2018-07-18 NOTE — ED Provider Notes (Addendum)
[facility] Emergency Department Provider Note    First MD Initiated Contact with Patient 07/18/18 1906     (approximate)  I have reviewed the triage vital signs and the nursing notes.   HISTORY  Chief Complaint Shortness of Breath    HPI Darrell Santiago is a 53 y.o. male below listed past medical history with recent hospitalization for shortness of breath and pleural effusion on the right side status post thoracentesis presents the ER for cough.  Not having any measured temperatures.  He is currently on antibiotics.  Denies any chest pain but is having worsening exertional dyspnea.  Also endorsing orthopnea.   Denies any history of malignancy or heart failure but does have extensive history of smoking.   Past Medical History:  Diagnosis Date  . Abnormal EKG   . Allergy    Seasonal  . Anxiety   . Chicken pox   . Depression   . GERD (gastroesophageal reflux disease)   . Hyperlipidemia   . Hypertension   . Urine incontinence    Family History  Problem Relation Age of Onset  . Alcohol abuse Father   . Diabetes Father   . Cancer Father        ?stage IV thyroid with met  . Cancer Maternal Grandfather        Colon Cancer   Past Surgical History:  Procedure Laterality Date  . CHOLECYSTECTOMY  2000  . LEFT HEART CATH AND CORONARY ANGIOGRAPHY N/A 01/31/2017   Procedure: LEFT HEART CATH AND CORONARY ANGIOGRAPHY;  Surgeon: Dionisio David, MD;  Location: [facility];  Service: Cardiovascular;  Laterality: N/A;   Patient Active Problem List   Diagnosis Date Noted  . Bilateral impacted cerumen 05/29/2018  . Pancreatitis 03/02/2018  . Fatty liver 01/19/2018  . Stress fracture of left foot 01/19/2018  . Elevated liver enzymes 09/19/2017  . Hyperlipidemia 09/19/2017  . Macrocytosis without anemia 06/05/2017  . Anxiety and depression 04/13/2017  . HTN (hypertension) 04/13/2017  . Abnormal EKG 04/13/2017  . Smoking 02/08/2017  . Chest pain  02/08/2017  . Skin mass 08/12/2016  . Neuropathy 02/05/2015  . GERD (gastroesophageal reflux disease) 02/05/2015  . Environmental allergies 02/05/2015      Prior to Admission medications   Medication Sig Start Date End Date Taking? Authorizing Provider  amoxicillin-clavulanate (AUGMENTIN) 500-125 MG tablet Take 1 tablet by mouth 2 (two) times daily. 07/16/18 07/23/18 Yes [provider]  aspirin 81 MG chewable tablet Chew 81 mg by mouth daily.     [provider]  budesonide-formoterol (SYMBICORT) 160-4.5 MCG/ACT inhaler Inhale 2 puffs into the lungs 2 (two) times daily. Rinse mouth after use 01/19/18   McLean-Scocuzza, Nino Glow, MD  LORazepam (ATIVAN) 0.5 MG tablet Take 1 tablet (0.5 mg total) by mouth daily as needed for anxiety. 01/19/18   McLean-Scocuzza, Nino Glow, MD  losartan (COZAAR) 50 MG tablet Take 1 tablet (50 mg total) by mouth daily. 05/24/18   McLean-Scocuzza, Nino Glow, MD  Multiple Vitamin (MULTIVITAMIN WITH MINERALS) TABS tablet Take 1 tablet by mouth daily.    [provider]  Naproxen Sodium (ALEVE) 220 MG CAPS Take 1 capsule by mouth daily as needed (workplace pain).     [provider]  OVER THE COUNTER MEDICATION Take 1 capsule by mouth daily.    [provider]  pantoprazole (PROTONIX) 20 MG tablet Take 1 tablet (20 mg total) by mouth daily. 30 minutes before food 09/19/17   McLean-Scocuzza,  Nino Glow, MD  pravastatin (PRAVACHOL) 20 MG tablet Take 1 tablet (20 mg total) by mouth at bedtime. 05/25/18   McLean-Scocuzza, Nino Glow, MD  predniSONE (STERAPRED UNI-PAK 21 TAB) 10 MG (21) TBPK tablet As directed 07/15/18   Dutch Quint B, FNP  sertraline (ZOLOFT) 50 MG tablet Take 1.5 tablets (75 mg total) by mouth daily. In am 01/19/18   McLean-Scocuzza, Nino Glow, MD  vitamin B-12 (CYANOCOBALAMIN) 1000 MCG tablet Take 1,000 mcg by mouth daily.     [provider]    Allergies Patient has no known allergies.    Social History Social  History   Tobacco Use  . Smoking status: Current Every Day Smoker    Packs/day: 1.00    Types: Cigarettes  . Smokeless tobacco: Never Used  Substance Use Topics  . Alcohol use: Yes    Alcohol/week: 21.0 standard drinks    Types: 21 Shots of liquor per week    Comment: 1 pt will last 1 week   . Drug use: No    Review of Systems Patient denies headaches, rhinorrhea, blurry vision, numbness, shortness of breath, chest pain, edema, cough, abdominal pain, nausea, vomiting, diarrhea, dysuria, fevers, rashes or hallucinations unless otherwise stated above in HPI. ____________________________________________   PHYSICAL EXAM:  VITAL SIGNS: Vitals:   07/18/18 1841  BP: 127/83  Pulse: (!) 112  Resp: (!) 22  Temp: 98.4 F (36.9 C)  SpO2: 95%    Constitutional: Alert and oriented.  Eyes: Conjunctivae are normal.  Head: Atraumatic. Nose: No congestion/rhinnorhea. Mouth/Throat: Mucous membranes are moist.   Neck: No stridor. Painless ROM.  Cardiovascular: mildly tachycardic, regular rhythm. Grossly normal heart sounds.  Good peripheral circulation. Respiratory: mild tachypnea with diminished right basilar lung sounds with otherwise scatter expiratory wheeze throughout Gastrointestinal: Soft and nontender. No distention. No abdominal bruits. No CVA tenderness. Genitourinary:  Musculoskeletal: No lower extremity tenderness nor edema.  No joint effusions. Neurologic:  Normal speech and language. No gross focal neurologic deficits are appreciated. No facial droop Skin:  Skin is warm, dry and intact. No rash noted. Psychiatric: Mood and affect are normal. Speech and behavior are normal.  ____________________________________________   LABS (all labs ordered are listed, but only abnormal results are displayed)  No results found for this or any previous visit (from the past 24 hour(s)). ____________________________________________  EKG My review and personal interpretation at Time:  18:42   Indication: sob  Rate: 120  Rhythm: sinus Axis: normal Other: rbbb, nonsepcific st abn, no stemi criteria ____________________________________________  RADIOLOGY  I personally reviewed all radiographic images ordered to evaluate for the above acute complaints and reviewed radiology reports and findings.  These findings were personally discussed with the patient.  Please see medical record for radiology report.  ____________________________________________   PROCEDURES  Procedure(s) performed:  Procedures    Critical Care performed: no ____________________________________________   INITIAL IMPRESSION / ASSESSMENT AND PLAN / ED COURSE  Pertinent labs & imaging results that were available during my care of the patient were reviewed by me and considered in my medical decision making (see chart for details).   DDX: Asthma, copd, CHF, pna, ptx, malignancy, Pe, anemia   JAIMIN KRUPKA is a 53 y.o. who presents to the ED with symptoms of worsening dyspnea despite recent thoracentesis.  Patient nontoxic-appearing but is mildly tachycardic and short of breath.  Does have wheezing on exam but is not hypoxic.  Chest x-ray shows improvement on aeration as to prior to the thoracentesis but is  recurrent.  I have recommended blood work as well as CT imaging chest to evaluate for the above differential.  The patient will be placed on continuous pulse oximetry and telemetry for monitoring.  Laboratory evaluation will be sent to evaluate for the above complaints.        ----------------------------------------- 8:00 PM on 07/18/2018 -----------------------------------------  I was called to bedside as the patient insisted on leaving AMA. I talked to the patient at length and carefully explained, in layman's terms, that the standard of care is to further evaluate his symptoms with blood work, ct imaging and cardiac and that by leaving against medical advice they not allowing [place] to treat  their acute medical conditions according to our best medical judgement, and that a serious adverse outcome including increased pain, suffering, short- or long-term disability and even death, could result. I also explained to the patient that we respect their point-of-view and are not angry. I made sure that they understood that they can, and should, return at any time if they change their mind. This conversation was witnessed by nursing staff.. The patient indicated understanding of our conversation but still desired to leave against medical advice. I deemed the patient to be of sound mind to make this decision and demonstrate understanding of our conversation.   The patient was evaluated in Emergency Department today for the symptoms described in the history of present illness. He/she was evaluated in the context of the global COVID-19 pandemic, which necessitated consideration that the patient might be at risk for infection with the SARS-CoV-2 virus that causes COVID-19. Institutional protocols and algorithms that pertain to the evaluation of patients at risk for COVID-19 are in a state of rapid change based on information released by regulatory bodies including the CDC and federal and state organizations. These policies and algorithms were followed during the patient's care in the ED.  As part of my medical decision making, I reviewed the following data within the [place] notes reviewed and incorporated, Labs reviewed, notes from prior ED visits.   ____________________________________________   FINAL CLINICAL IMPRESSION(S) / ED DIAGNOSES  Final diagnoses:  Shortness of breath      NEW MEDICATIONS STARTED DURING THIS VISIT:  Discharge Medication List as of 07/18/2018  8:01 PM       Note:  This document was prepared using Dragon voice recognition software and may include unintentional dictation errors.    Merlyn Lot, MD 07/18/18 Bennett Scrape    Merlyn Lot, MD 07/18/18 2002

## 2018-07-18 NOTE — ED Triage Notes (Signed)
Pt via pov from home with sob since Tuesday. He was seen last week (Monday) and had fluid drawn from right lung. On Tuesday evening he began having sob again. Pt states the sob is as severe as it was when he had the fluid drawn off last week. Pt speaking in sentences, but sounds breathless. NAD noted.

## 2018-07-19 ENCOUNTER — Other Ambulatory Visit: Payer: Self-pay | Admitting: Specialist

## 2018-07-19 DIAGNOSIS — R0602 Shortness of breath: Secondary | ICD-10-CM | POA: Diagnosis not present

## 2018-07-19 DIAGNOSIS — J439 Emphysema, unspecified: Secondary | ICD-10-CM | POA: Diagnosis not present

## 2018-07-19 DIAGNOSIS — J90 Pleural effusion, not elsewhere classified: Secondary | ICD-10-CM | POA: Diagnosis not present

## 2018-07-19 DIAGNOSIS — R0609 Other forms of dyspnea: Secondary | ICD-10-CM | POA: Diagnosis not present

## 2018-07-19 DIAGNOSIS — Z720 Tobacco use: Secondary | ICD-10-CM | POA: Diagnosis not present

## 2018-07-22 ENCOUNTER — Telehealth: Payer: BLUE CROSS/BLUE SHIELD | Admitting: Family

## 2018-07-22 DIAGNOSIS — J90 Pleural effusion, not elsewhere classified: Secondary | ICD-10-CM

## 2018-07-22 DIAGNOSIS — R059 Cough, unspecified: Secondary | ICD-10-CM

## 2018-07-22 DIAGNOSIS — R05 Cough: Secondary | ICD-10-CM

## 2018-07-22 NOTE — Progress Notes (Signed)
Based on what you shared with me, I feel your condition warrants further evaluation and I recommend that you be seen for a face to face office visit.  Given your symptoms you need close follow up. You need to contact your PCP or go to an Urgent Care for follow up.    NOTE: If you entered your credit card information for this eVisit, you will not be charged. You may see a "hold" on your card for the $35 but that hold will drop off and you will not have a charge processed.  If you are having a true medical emergency please call 911.  If you need an urgent face to face visit, [facility] has four urgent care centers for your convenience.    PLEASE NOTE: THE [facility]STACARE LOCATIONS AND URGENT CARE CLINICS DO NOT HAVE THE TESTING FOR CORONAVIRUS COVID19 AVAILABLE.  IF YOU FEEL YOU NEED THIS TEST YOU MUST GO TO A TRIAGE LOCATION AT [place]   [email or web address] to reserve your spot online an avoid wait times  [facility] [address], [address] [place], [place] [address] Modified hours of operation: Monday-Friday, 10 AM to 6 PM  Saturday & Sunday 10 AM to 4 PM *Across the street from [place] (New Address!) [address], [place], [place] [address] *Just off [address], across the road from [place] hours of operation: Monday-Friday, 10 AM to 5 PM  Closed Saturday & Sunday   The following sites will take your insurance:  . [facility] [place] a Provider at this Location  [address] [place], [place] [address] . 10 am to 8 pm Monday-Friday . 12 pm to 8 pm Saturday-Sunday   . [facility] at [place] a Provider at this Location  [address], [place], [place] [address] . 8 am to 8 pm Monday-Friday . 9 am to 6 pm Saturday . 11 am to  6 pm Sunday   . [facility] at [place] Get Driving Directions  [address] [place],  [address] . 8 am to 8 pm Monday-Friday . 8 am to 4 pm Saturday-Sunday   Your e-visit answers were reviewed by a board certified advanced clinical practitioner to complete your personal care plan.  Thank you for using e-Visits.

## 2018-07-23 ENCOUNTER — Inpatient Hospital Stay
Admission: EM | Admit: 2018-07-23 | Discharge: 2018-07-27 | DRG: 871 | Disposition: A | Discharge status: Other Acute Inpatient Hospital | Payer: BLUE CROSS/BLUE SHIELD | Attending: Family Medicine | Admitting: Family Medicine

## 2018-07-23 ENCOUNTER — Other Ambulatory Visit: Payer: Self-pay

## 2018-07-23 ENCOUNTER — Emergency Department: Payer: BLUE CROSS/BLUE SHIELD

## 2018-07-23 ENCOUNTER — Inpatient Hospital Stay: Payer: BLUE CROSS/BLUE SHIELD

## 2018-07-23 ENCOUNTER — Ambulatory Visit: Payer: Self-pay

## 2018-07-23 DIAGNOSIS — G629 Polyneuropathy, unspecified: Secondary | ICD-10-CM | POA: Diagnosis not present

## 2018-07-23 DIAGNOSIS — E785 Hyperlipidemia, unspecified: Secondary | ICD-10-CM | POA: Diagnosis not present

## 2018-07-23 DIAGNOSIS — Z811 Family history of alcohol abuse and dependence: Secondary | ICD-10-CM | POA: Diagnosis not present

## 2018-07-23 DIAGNOSIS — Z7951 Long term (current) use of inhaled steroids: Secondary | ICD-10-CM | POA: Diagnosis not present

## 2018-07-23 DIAGNOSIS — R110 Nausea: Secondary | ICD-10-CM | POA: Diagnosis not present

## 2018-07-23 DIAGNOSIS — Z9049 Acquired absence of other specified parts of digestive tract: Secondary | ICD-10-CM | POA: Diagnosis not present

## 2018-07-23 DIAGNOSIS — R0902 Hypoxemia: Secondary | ICD-10-CM | POA: Diagnosis not present

## 2018-07-23 DIAGNOSIS — Z91048 Other nonmedicinal substance allergy status: Secondary | ICD-10-CM | POA: Diagnosis not present

## 2018-07-23 DIAGNOSIS — R05 Cough: Secondary | ICD-10-CM | POA: Diagnosis not present

## 2018-07-23 DIAGNOSIS — J918 Pleural effusion in other conditions classified elsewhere: Secondary | ICD-10-CM | POA: Diagnosis present

## 2018-07-23 DIAGNOSIS — F17210 Nicotine dependence, cigarettes, uncomplicated: Secondary | ICD-10-CM | POA: Diagnosis present

## 2018-07-23 DIAGNOSIS — J181 Lobar pneumonia, unspecified organism: Secondary | ICD-10-CM | POA: Diagnosis present

## 2018-07-23 DIAGNOSIS — F329 Major depressive disorder, single episode, unspecified: Secondary | ICD-10-CM | POA: Diagnosis present

## 2018-07-23 DIAGNOSIS — Z720 Tobacco use: Secondary | ICD-10-CM | POA: Diagnosis not present

## 2018-07-23 DIAGNOSIS — E43 Unspecified severe protein-calorie malnutrition: Secondary | ICD-10-CM | POA: Diagnosis not present

## 2018-07-23 DIAGNOSIS — J440 Chronic obstructive pulmonary disease with acute lower respiratory infection: Secondary | ICD-10-CM | POA: Diagnosis present

## 2018-07-23 DIAGNOSIS — R935 Abnormal findings on diagnostic imaging of other abdominal regions, including retroperitoneum: Secondary | ICD-10-CM | POA: Diagnosis not present

## 2018-07-23 DIAGNOSIS — K920 Hematemesis: Secondary | ICD-10-CM | POA: Diagnosis present

## 2018-07-23 DIAGNOSIS — Z20828 Contact with and (suspected) exposure to other viral communicable diseases: Secondary | ICD-10-CM | POA: Diagnosis present

## 2018-07-23 DIAGNOSIS — R279 Unspecified lack of coordination: Secondary | ICD-10-CM | POA: Diagnosis not present

## 2018-07-23 DIAGNOSIS — R6889 Other general symptoms and signs: Secondary | ICD-10-CM | POA: Diagnosis not present

## 2018-07-23 DIAGNOSIS — K8689 Other specified diseases of pancreas: Secondary | ICD-10-CM | POA: Diagnosis not present

## 2018-07-23 DIAGNOSIS — E7800 Pure hypercholesterolemia, unspecified: Secondary | ICD-10-CM | POA: Diagnosis not present

## 2018-07-23 DIAGNOSIS — J189 Pneumonia, unspecified organism: Secondary | ICD-10-CM | POA: Diagnosis present

## 2018-07-23 DIAGNOSIS — Z8719 Personal history of other diseases of the digestive system: Secondary | ICD-10-CM | POA: Diagnosis not present

## 2018-07-23 DIAGNOSIS — Z7952 Long term (current) use of systemic steroids: Secondary | ICD-10-CM | POA: Diagnosis not present

## 2018-07-23 DIAGNOSIS — I10 Essential (primary) hypertension: Secondary | ICD-10-CM | POA: Diagnosis present

## 2018-07-23 DIAGNOSIS — F419 Anxiety disorder, unspecified: Secondary | ICD-10-CM | POA: Diagnosis not present

## 2018-07-23 DIAGNOSIS — A419 Sepsis, unspecified organism: Principal | ICD-10-CM | POA: Diagnosis present

## 2018-07-23 DIAGNOSIS — R0602 Shortness of breath: Secondary | ICD-10-CM

## 2018-07-23 DIAGNOSIS — Z7982 Long term (current) use of aspirin: Secondary | ICD-10-CM

## 2018-07-23 DIAGNOSIS — Z79899 Other long term (current) drug therapy: Secondary | ICD-10-CM

## 2018-07-23 DIAGNOSIS — Z7289 Other problems related to lifestyle: Secondary | ICD-10-CM | POA: Diagnosis not present

## 2018-07-23 DIAGNOSIS — F1010 Alcohol abuse, uncomplicated: Secondary | ICD-10-CM | POA: Diagnosis not present

## 2018-07-23 DIAGNOSIS — K219 Gastro-esophageal reflux disease without esophagitis: Secondary | ICD-10-CM | POA: Diagnosis present

## 2018-07-23 DIAGNOSIS — D7589 Other specified diseases of blood and blood-forming organs: Secondary | ICD-10-CM | POA: Diagnosis not present

## 2018-07-23 DIAGNOSIS — Z9889 Other specified postprocedural states: Secondary | ICD-10-CM

## 2018-07-23 DIAGNOSIS — J180 Bronchopneumonia, unspecified organism: Secondary | ICD-10-CM | POA: Diagnosis present

## 2018-07-23 DIAGNOSIS — J90 Pleural effusion, not elsewhere classified: Secondary | ICD-10-CM | POA: Diagnosis not present

## 2018-07-23 DIAGNOSIS — R918 Other nonspecific abnormal finding of lung field: Secondary | ICD-10-CM | POA: Diagnosis not present

## 2018-07-23 DIAGNOSIS — K863 Pseudocyst of pancreas: Secondary | ICD-10-CM | POA: Diagnosis not present

## 2018-07-23 DIAGNOSIS — Z743 Need for continuous supervision: Secondary | ICD-10-CM | POA: Diagnosis not present

## 2018-07-23 DIAGNOSIS — K861 Other chronic pancreatitis: Secondary | ICD-10-CM | POA: Diagnosis not present

## 2018-07-23 LAB — CBC WITH DIFFERENTIAL/PLATELET
Abs Immature Granulocytes: 0.09 10*3/uL — ABNORMAL HIGH (ref 0.00–0.07)
Basophils Absolute: 0.1 10*3/uL (ref 0.0–0.1)
Basophils Relative: 1 %
Eosinophils Absolute: 0.1 10*3/uL (ref 0.0–0.5)
Eosinophils Relative: 1 %
HCT: 43.6 % (ref 39.0–52.0)
Hemoglobin: 14.4 g/dL (ref 13.0–17.0)
Immature Granulocytes: 1 %
Lymphocytes Relative: 16 %
Lymphs Abs: 2.5 10*3/uL (ref 0.7–4.0)
MCH: 34.8 pg — ABNORMAL HIGH (ref 26.0–34.0)
MCHC: 33 g/dL (ref 30.0–36.0)
MCV: 105.3 fL — ABNORMAL HIGH (ref 80.0–100.0)
Monocytes Absolute: 1.7 10*3/uL — ABNORMAL HIGH (ref 0.1–1.0)
Monocytes Relative: 11 %
Neutro Abs: 11.6 10*3/uL — ABNORMAL HIGH (ref 1.7–7.7)
Neutrophils Relative %: 70 %
Platelets: 308 10*3/uL (ref 150–400)
RBC: 4.14 MIL/uL — ABNORMAL LOW (ref 4.22–5.81)
RDW: 13.1 % (ref 11.5–15.5)
WBC: 16.1 10*3/uL — ABNORMAL HIGH (ref 4.0–10.5)
nRBC: 0 % (ref 0.0–0.2)

## 2018-07-23 LAB — HEMOGLOBIN AND HEMATOCRIT, BLOOD
HCT: 41.9 % (ref 39.0–52.0)
Hemoglobin: 13.7 g/dL (ref 13.0–17.0)

## 2018-07-23 LAB — COMPREHENSIVE METABOLIC PANEL
ALT: 57 U/L — ABNORMAL HIGH (ref 0–44)
AST: 71 U/L — ABNORMAL HIGH (ref 15–41)
Albumin: 3.6 g/dL (ref 3.5–5.0)
Alkaline Phosphatase: 165 U/L — ABNORMAL HIGH (ref 38–126)
Anion gap: 13 (ref 5–15)
BUN: 9 mg/dL (ref 6–20)
CO2: 25 mmol/L (ref 22–32)
Calcium: 9.2 mg/dL (ref 8.9–10.3)
Chloride: 101 mmol/L (ref 98–111)
Creatinine, Ser: 0.45 mg/dL — ABNORMAL LOW (ref 0.61–1.24)
GFR calc Af Amer: >60 mL/min (ref >60)
GFR calc non Af Amer: >60 mL/min (ref >60)
Glucose, Bld: 130 mg/dL — ABNORMAL HIGH (ref 70–99)
Potassium: 3.3 mmol/L — ABNORMAL LOW (ref 3.5–5.1)
Sodium: 139 mmol/L (ref 135–145)
Total Bilirubin: 0.7 mg/dL (ref 0.3–1.2)
Total Protein: 6.9 g/dL (ref 6.5–8.1)

## 2018-07-23 LAB — URINALYSIS, ROUTINE W REFLEX MICROSCOPIC
Bilirubin Urine: NEGATIVE
Glucose, UA: NEGATIVE mg/dL
Hgb urine dipstick: NEGATIVE
Ketones, ur: NEGATIVE mg/dL
Leukocytes,Ua: NEGATIVE
Nitrite: NEGATIVE
Protein, ur: NEGATIVE mg/dL
Specific Gravity, Urine: 1.003 — ABNORMAL LOW (ref 1.005–1.030)
pH: 8 (ref 5.0–8.0)

## 2018-07-23 LAB — TYPE AND SCREEN
ABO/RH(D): A NEG
Antibody Screen: NEGATIVE

## 2018-07-23 LAB — PROCALCITONIN: Procalcitonin: <0.1 ng/mL

## 2018-07-23 LAB — PROTIME-INR
INR: 1 (ref 0.8–1.2)
Prothrombin Time: 12.6 seconds (ref 11.4–15.2)

## 2018-07-23 LAB — BLOOD GAS, VENOUS
Acid-Base Excess: 3.5 mmol/L — ABNORMAL HIGH (ref 0.0–2.0)
Bicarbonate: 27.8 mmol/L (ref 20.0–28.0)
O2 Saturation: 94.4 %
Patient temperature: 37
pCO2, Ven: 40 mmHg — ABNORMAL LOW (ref 44.0–60.0)
pH, Ven: 7.45 — ABNORMAL HIGH (ref 7.250–7.430)
pO2, Ven: 69 mmHg — ABNORMAL HIGH (ref 32.0–45.0)

## 2018-07-23 LAB — LACTIC ACID, PLASMA
Lactic Acid, Venous: 2 mmol/L (ref 0.5–1.9)
Lactic Acid, Venous: 1.2 mmol/L (ref 0.5–1.9)

## 2018-07-23 LAB — BRAIN NATRIURETIC PEPTIDE: B Natriuretic Peptide: 50 pg/mL (ref 0.0–100.0)

## 2018-07-23 LAB — TROPONIN I: Troponin I: <0.03 ng/mL (ref <0.03)

## 2018-07-23 LAB — APTT: aPTT: 31 seconds (ref 24–36)

## 2018-07-23 MED ORDER — VANCOMYCIN HCL 10 G IV SOLR
2000.0000 mg | Freq: Once | INTRAVENOUS | Status: AC
  Administered 2018-07-23: 2000 mg via INTRAVENOUS
  Filled 2018-07-23: qty 2000

## 2018-07-23 MED ORDER — SODIUM CHLORIDE 0.9 % IV SOLN
2.0000 g | Freq: Three times a day (TID) | INTRAVENOUS | Status: DC
  Administered 2018-07-23 – 2018-07-25 (×6): 2 g via INTRAVENOUS
  Filled 2018-07-23 (×9): qty 2

## 2018-07-23 MED ORDER — POTASSIUM CHLORIDE 10 MEQ/100ML IV SOLN
10.0000 meq | INTRAVENOUS | Status: AC
  Filled 2018-07-23 (×4): qty 100

## 2018-07-23 MED ORDER — VANCOMYCIN HCL 10 G IV SOLR
1500.0000 mg | Freq: Two times a day (BID) | INTRAVENOUS | Status: DC
  Administered 2018-07-24: 1500 mg via INTRAVENOUS
  Filled 2018-07-23 (×2): qty 1500

## 2018-07-23 MED ORDER — PIPERACILLIN-TAZOBACTAM 3.375 G IVPB 30 MIN
3.3750 g | Freq: Once | INTRAVENOUS | Status: AC
  Administered 2018-07-23: 3.375 g via INTRAVENOUS
  Filled 2018-07-23: qty 50

## 2018-07-23 MED ORDER — NICOTINE 21 MG/24HR TD PT24
21.0000 mg | MEDICATED_PATCH | Freq: Every day | TRANSDERMAL | Status: DC
  Administered 2018-07-23 – 2018-07-27 (×5): 21 mg via TRANSDERMAL
  Filled 2018-07-23 (×5): qty 1

## 2018-07-23 MED ORDER — ONDANSETRON HCL 4 MG/2ML IJ SOLN
4.0000 mg | Freq: Once | INTRAMUSCULAR | Status: AC
  Administered 2018-07-23: 4 mg via INTRAVENOUS
  Filled 2018-07-23: qty 2

## 2018-07-23 MED ORDER — SODIUM CHLORIDE 0.9 % IV SOLN
8.0000 mg/h | INTRAVENOUS | Status: DC
  Administered 2018-07-23 (×2): 8 mg/h via INTRAVENOUS
  Filled 2018-07-23 (×2): qty 80

## 2018-07-23 MED ORDER — SODIUM CHLORIDE 0.9 % IV SOLN
INTRAVENOUS | Status: DC | PRN
  Administered 2018-07-23: via INTRAVENOUS
  Administered 2018-07-26 – 2018-07-27 (×2): 500 mL via INTRAVENOUS

## 2018-07-23 MED ORDER — SODIUM CHLORIDE 0.9 % IV SOLN
INTRAVENOUS | Status: DC
  Administered 2018-07-23 – 2018-07-25 (×3): via INTRAVENOUS

## 2018-07-23 MED ORDER — ONDANSETRON HCL 4 MG/2ML IJ SOLN: 4.0000 mg | Freq: Four times a day (QID) | INTRAMUSCULAR | Status: DC | PRN

## 2018-07-23 MED ORDER — ALBUTEROL SULFATE HFA 108 (90 BASE) MCG/ACT IN AERS
2.0000 | INHALATION_SPRAY | Freq: Once | RESPIRATORY_TRACT | Status: AC
  Administered 2018-07-23: 2 via RESPIRATORY_TRACT
  Filled 2018-07-23: qty 6.7

## 2018-07-23 MED ORDER — IOHEXOL 300 MG/ML  SOLN
75.0000 mL | Freq: Once | INTRAMUSCULAR | Status: AC | PRN
  Administered 2018-07-23: 75 mL via INTRAVENOUS

## 2018-07-23 MED ORDER — SENNOSIDES-DOCUSATE SODIUM 8.6-50 MG PO TABS: 1.0000 | ORAL_TABLET | Freq: Every evening | ORAL | Status: DC | PRN

## 2018-07-23 MED ORDER — ACETAMINOPHEN 650 MG RE SUPP: 650.0000 mg | Freq: Four times a day (QID) | RECTAL | Status: DC | PRN

## 2018-07-23 MED ORDER — PANTOPRAZOLE SODIUM 40 MG IV SOLR: 40.0000 mg | Freq: Two times a day (BID) | INTRAVENOUS | Status: DC

## 2018-07-23 MED ORDER — SODIUM CHLORIDE 0.9 % IV SOLN
80.0000 mg | Freq: Once | INTRAVENOUS | Status: AC
  Administered 2018-07-23: 80 mg via INTRAVENOUS
  Filled 2018-07-23: qty 80

## 2018-07-23 MED ORDER — POTASSIUM CHLORIDE 10 MEQ/100ML IV SOLN
10.0000 meq | INTRAVENOUS | Status: AC
  Administered 2018-07-23 – 2018-07-24 (×2): 10 meq via INTRAVENOUS
  Filled 2018-07-23 (×2): qty 100

## 2018-07-23 MED ORDER — ONDANSETRON HCL 4 MG PO TABS: 4.0000 mg | ORAL_TABLET | Freq: Four times a day (QID) | ORAL | Status: DC | PRN

## 2018-07-23 MED ORDER — ACETAMINOPHEN 325 MG PO TABS
650.0000 mg | ORAL_TABLET | Freq: Four times a day (QID) | ORAL | Status: DC | PRN
  Administered 2018-07-24 – 2018-07-27 (×5): 650 mg via ORAL
  Filled 2018-07-23 (×6): qty 2

## 2018-07-23 MED ORDER — SODIUM CHLORIDE 0.9 % IV SOLN
8.0000 mg/h | INTRAVENOUS | Status: DC
  Filled 2018-07-23: qty 80

## 2018-07-23 NOTE — ED Notes (Signed)
Called pharmacy for vanc

## 2018-07-23 NOTE — ED Triage Notes (Addendum)
Pt presents with SHOB and cough - he was in ED last week and left without full treatment - today he got increasingly worse and at this time is unable to speak in complete sentences without coughing - pt is coughing up frothy red sputum and has work of breathing

## 2018-07-23 NOTE — ED Notes (Signed)
Patient transported to 2A with mask. Temp 99.5. VSS, floor notified

## 2018-07-23 NOTE — H&P (Signed)
[facility] [place] NAME: Darrell Santiago    MR#:  [id number]  DATE OF BIRTH:  1965-06-06  DATE OF ADMISSION:  07/23/2018  PRIMARY CARE PHYSICIAN: McLean-Scocuzza, Nino Glow, MD   REQUESTING/REFERRING PHYSICIAN:   CHIEF COMPLAINT:   Chief Complaint  Patient presents with  . Shortness of Breath    HISTORY OF PRESENT ILLNESS: Darrell Santiago  is a 53 y.o. male with a known history of hyperlipidemia, hypertension, GERD, depression, seasonal allergies presented to the emergency room for shortness of breath, cough and fever.  Been going on for 1 week.  No history of travel or sick contacts.  Patient had thoracentesis in the past.  He also had an episode of vomiting of blood.  Hemoglobin is around 14.  Was worked up with chest x-ray which showed right lung pneumonia.  Lactic acid is also elevated.  PAST MEDICAL HISTORY:   Past Medical History:  Diagnosis Date  . Abnormal EKG   . Allergy    Seasonal  . Anxiety   . Chicken pox   . Depression   . GERD (gastroesophageal reflux disease)   . Hyperlipidemia   . Hypertension   . Urine incontinence     PAST SURGICAL HISTORY:  Past Surgical History:  Procedure Laterality Date  . CHOLECYSTECTOMY  2000  . LEFT HEART CATH AND CORONARY ANGIOGRAPHY N/A 01/31/2017   Procedure: LEFT HEART CATH AND CORONARY ANGIOGRAPHY;  Surgeon: Dionisio David, MD;  Location: [facility];  Service: Cardiovascular;  Laterality: N/A;    SOCIAL HISTORY:  Social History   Tobacco Use  . Smoking status: Current Every Day Smoker    Packs/day: 1.00    Types: Cigarettes  . Smokeless tobacco: Never Used  Substance Use Topics  . Alcohol use: Yes    Alcohol/week: 21.0 standard drinks    Types: 21 Shots of liquor per week    Comment: 1 pt will last 1 week     FAMILY HISTORY:  Family History  Problem Relation Age of Onset  . Alcohol abuse Father   . Diabetes Father   . Cancer Father        ?stage IV  thyroid with met  . Cancer Maternal Grandfather        Colon Cancer    DRUG ALLERGIES: No Known Allergies  REVIEW OF SYSTEMS:   CONSTITUTIONAL: Has fever, fatigue and weakness.  EYES: No blurred or double vision.  EARS, NOSE, AND THROAT: No tinnitus or ear pain.  RESPIRATORY: Has cough, shortness of breath,  No wheezing or hemoptysis.  CARDIOVASCULAR: No chest pain, orthopnea, edema.  GASTROINTESTINAL: No nausea, vomiting, diarrhea or abdominal pain.  GENITOURINARY: No dysuria, hematuria.  ENDOCRINE: No polyuria, nocturia,  HEMATOLOGY: No anemia, easy bruising or bleeding SKIN: No rash or lesion. MUSCULOSKELETAL: No joint pain or arthritis.   NEUROLOGIC: No tingling, numbness, weakness.  PSYCHIATRY: No anxiety or depression.   MEDICATIONS AT HOME:  Prior to Admission medications   Medication Sig Start Date End Date Taking? Authorizing Provider  amoxicillin-clavulanate (AUGMENTIN) 500-125 MG tablet Take 1 tablet by mouth 2 (two) times daily. 07/16/18 07/23/18  [provider]  aspirin 81 MG chewable tablet Chew 81 mg by mouth daily.     [provider]  budesonide-formoterol (SYMBICORT) 160-4.5 MCG/ACT inhaler Inhale 2 puffs into the lungs 2 (two) times daily. Rinse mouth after use 01/19/18   McLean-Scocuzza, Nino Glow, MD  LORazepam (ATIVAN) 0.5 MG tablet Take 1  tablet (0.5 mg total) by mouth daily as needed for anxiety. 01/19/18   McLean-Scocuzza, Nino Glow, MD  losartan (COZAAR) 50 MG tablet Take 1 tablet (50 mg total) by mouth daily. 05/24/18   McLean-Scocuzza, Nino Glow, MD  Multiple Vitamin (MULTIVITAMIN WITH MINERALS) TABS tablet Take 1 tablet by mouth daily.    [provider]  Naproxen Sodium (ALEVE) 220 MG CAPS Take 1 capsule by mouth daily as needed (workplace pain).     [provider]  OVER THE COUNTER MEDICATION Take 1 capsule by mouth daily.    [provider]  pantoprazole (PROTONIX) 20 MG tablet Take 1 tablet (20 mg total) by mouth  daily. 30 minutes before food 09/19/17   McLean-Scocuzza, Nino Glow, MD  pravastatin (PRAVACHOL) 20 MG tablet Take 1 tablet (20 mg total) by mouth at bedtime. 05/25/18   McLean-Scocuzza, Nino Glow, MD  predniSONE (STERAPRED UNI-PAK 21 TAB) 10 MG (21) TBPK tablet As directed 07/15/18   Dutch Quint B, FNP  sertraline (ZOLOFT) 50 MG tablet Take 1.5 tablets (75 mg total) by mouth daily. In am 01/19/18   McLean-Scocuzza, Nino Glow, MD  vitamin B-12 (CYANOCOBALAMIN) 1000 MCG tablet Take 1,000 mcg by mouth daily.     [provider]      PHYSICAL EXAMINATION:   VITAL SIGNS: Blood pressure 117/82, pulse (!) 105, temperature 99.9 F (37.7 C), temperature source Oral, resp. rate (!) 39, height _0  (1.778 m), weight 88.5 kg, SpO2 95 %.  GENERAL:  53 y.o.-year-old patient lying in the bed with no acute distress.  EYES: Pupils equal, round, reactive to light and accommodation. No scleral icterus. Extraocular muscles intact.  HEENT: Head atraumatic, normocephalic. Oropharynx and nasopharynx clear.  NECK:  Supple, no jugular venous distention. No thyroid enlargement, no tenderness.  LUNGS: Decreased breath sounds bilaterally,rales heard in right lung. No use of accessory muscles of respiration.  CARDIOVASCULAR: S1, S2 normal. No murmurs, rubs, or gallops.  ABDOMEN: Soft, nontender, nondistended. Bowel sounds present. No organomegaly or mass.  EXTREMITIES: No pedal edema, cyanosis, or clubbing.  NEUROLOGIC: Cranial nerves II through XII are intact. Muscle strength 5/5 in all extremities. Sensation intact. Gait not checked.  PSYCHIATRIC: The patient is alert and oriented x 3.  SKIN: No obvious rash, lesion, or ulcer.   LABORATORY PANEL:   CBC Recent Labs  Lab 07/23/18 1826  WBC 16.1*  HGB 14.4  HCT 43.6  PLT 308  MCV 105.3*  MCH 34.8*  MCHC 33.0  RDW 13.1  LYMPHSABS 2.5  MONOABS 1.7*  EOSABS 0.1  BASOSABS 0.1    ------------------------------------------------------------------------------------------------------------------  Chemistries  Recent Labs  Lab 07/23/18 1826  NA 139  K 3.3*  CL 101  CO2 25  GLUCOSE 130*  BUN 9  CREATININE 0.45*  CALCIUM 9.2  AST 71*  ALT 57*  ALKPHOS 165*  BILITOT 0.7   ------------------------------------------------------------------------------------------------------------------ estimated creatinine clearance is 123.8 mL/min (A) (by C-G formula based on SCr of 0.45 mg/dL (L)). ------------------------------------------------------------------------------------------------------------------ No results for input(s): TSH, T4TOTAL, T3FREE, THYROIDAB in the last 72 hours.  Invalid input(s): FREET3   Coagulation profile Recent Labs  Lab 07/23/18 1827  INR 1.0   ------------------------------------------------------------------------------------------------------------------- No results for input(s): DDIMER in the last 72 hours. -------------------------------------------------------------------------------------------------------------------  Cardiac Enzymes Recent Labs  Lab 07/23/18 1826  TROPONINI <0.03   ------------------------------------------------------------------------------------------------------------------ Invalid input(s): POCBNP  ---------------------------------------------------------------------------------------------------------------  Urinalysis    Component Value Date/Time   COLORURINE STRAW (A) 07/23/2018 1826   APPEARANCEUR CLEAR (A) 07/23/2018 1826   LABSPEC  1.003 (L) 07/23/2018 1826   PHURINE 8.0 07/23/2018 1826   GLUCOSEU NEGATIVE 07/23/2018 1826   GLUCOSEU NEGATIVE 06/05/2017 0852   HGBUR NEGATIVE 07/23/2018 1826   BILIRUBINUR NEGATIVE 07/23/2018 1826   KETONESUR NEGATIVE 07/23/2018 1826   PROTEINUR NEGATIVE 07/23/2018 1826   UROBILINOGEN 1.0 06/05/2017 0852   NITRITE NEGATIVE 07/23/2018 1826    LEUKOCYTESUR NEGATIVE 07/23/2018 1826     RADIOLOGY: Dg Chest Portable 1 View  Result Date: 07/23/2018 CLINICAL DATA:  Cough and short of breath. Current treatment for pneumonia. EXAM: PORTABLE CHEST 1 VIEW COMPARISON:  07/18/2018 FINDINGS: Interval progression of infiltrate in the right lung base with small right effusion. Left lung remains clear. Cardiac enlargement without heart failure. IMPRESSION: Interval progression of right lower lobe infiltrate compatible with pneumonia. Small right pleural effusion. Electronically Signed   By: Franchot Gallo M.D.   On: 07/23/2018 18:25    EKG: Orders placed or performed during the hospital encounter of 07/23/18  . ED EKG 12-Lead  . ED EKG 12-Lead    IMPRESSION AND PLAN: 53 year old male patient with a known history of hyperlipidemia, hypertension, GERD, depression, seasonal allergies presented to the emergency room for shortness of breath, cough and fever  -Sepsis secondary to pneumonia Admit patient to medical floor IV fluids antibiotics follow-up lactic acid level  -Pneumonia Bacterial versus viral Check COVID-19 test Flu test Droplet and contact precautions Infectious disease consult Infection control team notified Start patient on Vanco and cefepime antibiotics  -Hematemesis IV Protonix drip Gastroenterology consultation Monitor hemoglobin hematocrit  -DVT prophylaxis sequential compression device to lower extremities  -Tobacco abuse Tobacco cessation counseled for 6 minutes Nicotine patch offered All the records are reviewed and case discussed with ED provider. Management plans discussed with the patient, family and they are in agreement.  CODE STATUS:Full code Code Status History    Date Active Date Inactive Code Status Order ID Comments User Context   03/02/2018 0322 03/03/2018 1839 Full Code [id number]  Harrie Foreman, MD Inpatient     TOTAL TIME TAKING CARE OF THIS PATIENT: 53 minutes.    Saundra Shelling M.D on  07/23/2018 at 8:00 PM  Between 7am to 6pm - Pager - [phone]  After 6pm go to [email or web address] - password [email or web address] [facility]  [facility]  Office  [phone]  CC: Primary care physician; McLean-Scocuzza, Nino Glow, MD

## 2018-07-23 NOTE — Telephone Encounter (Signed)
Patient's wife Epifanio Lesches called and says the patient is being treated for pneumonia and is on antibiotics prescribed by his pulmonary doctor. She says he has 2 days left and he started running a fever 100.5 today. She says yesterday he was coughing up blood and today it is red-tinged. She wants to know if it is concerning that he's running a fever on antibiotics, I advised it's concerning as well as the coughing up blood. I asked about SOB, she says not as bad as last week when she took him to [facility], but he's still SOB. She says his other symptoms are muscle aches like with the flu. I asked her to hold while I call to the office, I called and spoke to [facility], [place] who advised her to take him to the ED, because the pneumonia may be from the coronavirus. I advised the wife of Kathy's recommendation, she says she's already taken him and they said no testing is needed and only gave him a breathing treatment. I advised to call the pulmonologist as another opinion, since he is under his care for pneumonia, but the recommendation to go to the ED is what is recommended from Dr. Terese Door as well, she verbalized understanding.   Answer Assessment - Initial Assessment Questions 1. ONSET: "When did you start coughing up blood?"     Yesterday, today 2. SEVERITY: "How many times?" "How much blood?" (e.g., flecks, streaks, tablespoons, etc)     Some mixed in with phlegm 3. COUGHING SPASMS: "Did the blood appear after a coughing spell?"       Yes 4. RESPIRATORY DISTRESS: "Describe your breathing."      SOB, but not as bad as last week  5. FEVER: "Do you have a fever?" If so, ask: "What is your temperature, how was it measured, and when did it start?"     Yes, 100.5 came back today 6. SPUTUM: "Describe the color of your sputum" (clear, white, yellow, green), "Has there been any change recently?"      Bright red 7. CARDIAC HISTORY: "Do you have any history of heart disease?" (e.g., heart attack, congestive  heart failure)      No 8. LUNG HISTORY: "Do you have any history of lung disease?"  (e.g., pulmonary embolus, asthma, emphysema)     Currently pneumonia 9. PE RISK FACTORS: "Do you have a history of blood clots?" (or: recent major surgery, recent prolonged travel, bedridden)     No 10. OTHER SYMPTOMS: "Do you have any other symptoms?" (e.g., nosebleed, chest pain, abdominal pain, vomiting)      Muscle aches 11. PREGNANCY: "Is there any chance you are pregnant?" "When was your last menstrual period?"       No 12. TRAVEL: "Have you traveled out of the country in the last month?" (e.g., travel history, exposures)       No  Protocols used: COUGHING UP BLOOD-A-AH

## 2018-07-23 NOTE — ED Notes (Signed)
ED TO INPATIENT HANDOFF REPORT  ED Nurse Name and Phone #: Anda Kraft [phone]  S Name/Age/Gender Darrell Santiago 53 y.o. male Room/Bed: ED11A/ED11A  Code Status   Code Status: Prior  Home/SNF/Other Home Patient oriented to: self, place, time and situation Is this baseline? Yes   Triage Complete: Triage complete  Chief Complaint  Cough/SHOB/Fever  Triage Note Pt presents with [facility] and cough - he was in ED last week and left without full treatment - today he got increasingly worse and at this time is unable to speak in complete sentences without coughing - pt is coughing up frothy red sputum and has work of breathing   Allergies No Known Allergies  Level of Care/Admitting Diagnosis ED Disposition    ED Disposition Condition Wing: [place] [[id number]]  Level of Care: Med-Surg [16]  Diagnosis: Pneumonia [227785]  Admitting Physician: Saundra Shelling [[id number]]  Attending Physician: Saundra Shelling [[id number]]  Estimated length of stay: past midnight tomorrow  Certification:: I certify this patient will need inpatient services for at least 2 midnights  Bed request comments: rule out covid, low risk  PT Class (Do Not Modify): Inpatient [101]  PT Acc Code (Do Not Modify): Private [1]       B Medical/Surgery History Past Medical History:  Diagnosis Date  . Abnormal EKG   . Allergy    Seasonal  . Anxiety   . Chicken pox   . Depression   . GERD (gastroesophageal reflux disease)   . Hyperlipidemia   . Hypertension   . Urine incontinence    Past Surgical History:  Procedure Laterality Date  . CHOLECYSTECTOMY  2000  . LEFT HEART CATH AND CORONARY ANGIOGRAPHY N/A 01/31/2017   Procedure: LEFT HEART CATH AND CORONARY ANGIOGRAPHY;  Surgeon: Dionisio David, MD;  Location: [facility];  Service: Cardiovascular;  Laterality: N/A;     A IV Location/Drains/Wounds Patient Lines/Drains/Airways Status   Active  Line/Drains/Airways    Name:   Placement date:   Placement time:   Site:   Days:   Peripheral IV 07/23/18 Left Antecubital   07/23/18    1833    Antecubital   less than 1   Peripheral IV 07/23/18 Left Forearm   07/23/18    1833    Forearm   less than 1          Intake/Output Last 24 hours No intake or output data in the 24 hours ending 07/23/18 2101  Labs/Imaging Results for orders placed or performed during the hospital encounter of 07/23/18 (from the past 48 hour(s))  Blood gas, venous ([facility], [facility], [facility])     Status: Abnormal   Collection Time: 07/23/18  6:04 PM  Result Value Ref Range   pH, Ven 7.45 (H) 7.250 - 7.430   pCO2, Ven 40 (L) 44.0 - 60.0 mmHg   pO2, Ven 69.0 (H) 32.0 - 45.0 mmHg   Bicarbonate 27.8 20.0 - 28.0 mmol/L   Acid-Base Excess 3.5 (H) 0.0 - 2.0 mmol/L   O2 Saturation 94.4 %   Patient temperature 37.0    Collection site VEIN    Sample type VEIN     Comment: Performed at [facility], [address] [place], [place] [address]  Lactic acid, plasma     Status: Abnormal   Collection Time: 07/23/18  6:26 PM  Result Value Ref Range   Lactic Acid, Venous 2.0 (HH) 0.5 - 1.9 mmol/L    Comment: CRITICAL  RESULT CALLED TO, READ BACK BY AND VERIFIED WITH ALICIA GRANGER @1906  07/23/18 AKT Performed at [facility], [address] [place], [place] [address]   Comprehensive metabolic panel     Status: Abnormal   Collection Time: 07/23/18  6:26 PM  Result Value Ref Range   Sodium 139 135 - 145 mmol/L   Potassium 3.3 (L) 3.5 - 5.1 mmol/L   Chloride 101 98 - 111 mmol/L   CO2 25 22 - 32 mmol/L   Glucose, Bld 130 (H) 70 - 99 mg/dL   BUN 9 6 - 20 mg/dL   Creatinine, Ser 0.45 (L) 0.61 - 1.24 mg/dL   Calcium 9.2 8.9 - 10.3 mg/dL   Total Protein 6.9 6.5 - 8.1 g/dL   Albumin 3.6 3.5 - 5.0 g/dL   AST 71 (H) 15 - 41 U/L   ALT 57 (H) 0 - 44 U/L   Alkaline Phosphatase 165 (H) 38 - 126 U/L   Total Bilirubin 0.7 0.3 - 1.2 mg/dL   GFR calc non Af Amer >60 >60  mL/min   GFR calc Af Amer >60 >60 mL/min   Anion gap 13 5 - 15    Comment: Performed at [facility], [address] [place], [place] [address]  CBC WITH DIFFERENTIAL     Status: Abnormal   Collection Time: 07/23/18  6:26 PM  Result Value Ref Range   WBC 16.1 (H) 4.0 - 10.5 K/uL   RBC 4.14 (L) 4.22 - 5.81 MIL/uL   Hemoglobin 14.4 13.0 - 17.0 g/dL   HCT 43.6 39.0 - 52.0 %   MCV 105.3 (H) 80.0 - 100.0 fL   MCH 34.8 (H) 26.0 - 34.0 pg   MCHC 33.0 30.0 - 36.0 g/dL   RDW 13.1 11.5 - 15.5 %   Platelets 308 150 - 400 K/uL   nRBC 0.0 0.0 - 0.2 %   Neutrophils Relative % 70 %   Neutro Abs 11.6 (H) 1.7 - 7.7 K/uL   Lymphocytes Relative 16 %   Lymphs Abs 2.5 0.7 - 4.0 K/uL   Monocytes Relative 11 %   Monocytes Absolute 1.7 (H) 0.1 - 1.0 K/uL   Eosinophils Relative 1 %   Eosinophils Absolute 0.1 0.0 - 0.5 K/uL   Basophils Relative 1 %   Basophils Absolute 0.1 0.0 - 0.1 K/uL   Immature Granulocytes 1 %   Abs Immature Granulocytes 0.09 (H) 0.00 - 0.07 K/uL    Comment: Performed at [facility], [address] [place], [place] [address]  Urinalysis, Routine w reflex microscopic     Status: Abnormal   Collection Time: 07/23/18  6:26 PM  Result Value Ref Range   Color, Urine STRAW (A) YELLOW   APPearance CLEAR (A) CLEAR   Specific Gravity, Urine 1.003 (L) 1.005 - 1.030   pH 8.0 5.0 - 8.0   Glucose, UA NEGATIVE NEGATIVE mg/dL   Hgb urine dipstick NEGATIVE NEGATIVE   Bilirubin Urine NEGATIVE NEGATIVE   Ketones, ur NEGATIVE NEGATIVE mg/dL   Protein, ur NEGATIVE NEGATIVE mg/dL   Nitrite NEGATIVE NEGATIVE   Leukocytes,Ua NEGATIVE NEGATIVE    Comment: Performed at [facility], [address] [place], [place] [address]  Brain natriuretic peptide     Status: None   Collection Time: 07/23/18  6:26 PM  Result Value Ref Range   B Natriuretic Peptide 50.0 0.0 - 100.0 pg/mL    Comment: Performed at  [facility], [address] [place], [place] [address]  Procalcitonin     Status: None   Collection Time: 07/23/18  6:26 PM  Result Value Ref Range   Procalcitonin <0.10 ng/mL    Comment:        Interpretation: PCT (Procalcitonin) <= 0.5 ng/mL: Systemic infection (sepsis) is not likely. Local bacterial infection is possible. (NOTE)       Sepsis PCT Algorithm           Lower Respiratory Tract                                      Infection PCT Algorithm    ----------------------------     ----------------------------         PCT < 0.25 ng/mL                PCT < 0.10 ng/mL         Strongly encourage             Strongly discourage   discontinuation of antibiotics    initiation of antibiotics    ----------------------------     -----------------------------       PCT 0.25 - 0.50 ng/mL            PCT 0.10 - 0.25 ng/mL               OR       >80% decrease in PCT            Discourage initiation of                                            antibiotics      Encourage discontinuation           of antibiotics    ----------------------------     -----------------------------         PCT >= 0.50 ng/mL              PCT 0.26 - 0.50 ng/mL               AND        <80% decrease in PCT             Encourage initiation of                                             antibiotics       Encourage continuation           of antibiotics    ----------------------------     -----------------------------        PCT >= 0.50 ng/mL                  PCT > 0.50 ng/mL               AND         increase in PCT                  Strongly encourage                                      initiation of antibiotics  Strongly encourage escalation           of antibiotics                                     -----------------------------                                           PCT <= 0.25 ng/mL                                                 OR                                        > 80% decrease in PCT                                     Discontinue / Do not  initiate                                             antibiotics Performed at [facility], [address] [place], [place] [address]   Troponin I - ONCE - STAT     Status: None   Collection Time: 07/23/18  6:26 PM  Result Value Ref Range   Troponin I <0.03 <0.03 ng/mL    Comment: Performed at [facility], [address] [place], [place] [address]  Protime-INR     Status: None   Collection Time: 07/23/18  6:27 PM  Result Value Ref Range   Prothrombin Time 12.6 11.4 - 15.2 seconds   INR 1.0 0.8 - 1.2    Comment: (NOTE) INR goal varies based on device and disease states. Performed at [facility], [address] [place], [place] [address]   APTT     Status: None   Collection Time: 07/23/18  6:27 PM  Result Value Ref Range   aPTT 31 24 - 36 seconds    Comment: Performed at [facility], [address] [place], [place] [address]  Type and screen [place]     Status: None   Collection Time: 07/23/18  7:06 PM  Result Value Ref Range   ABO/RH(D) A NEG    Antibody Screen NEG    Sample Expiration      07/26/2018 Performed at [facility], [address] [place], [place] [address]    Dg Chest Portable 1 View  Result Date: 07/23/2018 CLINICAL DATA:  Cough and short of breath. Current treatment for pneumonia. EXAM: PORTABLE CHEST 1 VIEW COMPARISON:  07/18/2018 FINDINGS: Interval progression of infiltrate in the right lung base with small right effusion. Left lung remains clear. Cardiac enlargement without heart failure. IMPRESSION: Interval progression of right lower lobe infiltrate compatible with pneumonia. Small right pleural effusion. Electronically Signed   By: Franchot Gallo M.D.   On: 07/23/2018 18:25    Pending Labs Unresulted  Labs (From admission, onward)    Start     Ordered   07/23/18 2200  Hemoglobin and hematocrit, blood  Now then every 6 hours,   STAT     07/23/18 1959   07/23/18 2036  MRSA  PCR Screening  Once,   STAT     07/23/18 2035   07/23/18 1842  Novel Coronavirus, NAA (hospital order; send-out to ref lab)  (Novel Coronavirus, NAA [facility] Order; send-out to ref lab) with precautions panel)  Once,   STAT    Question Answer Comment  Current symptoms Fever and Shortness of breath   Excluded other viral illnesses No (testing not indicated)   Patient immune status Normal      07/23/18 1842   07/23/18 1804  Lactic acid, plasma  Now then every 2 hours,   STAT     07/23/18 1804   07/23/18 1804  Blood Culture (routine x 2)  BLOOD CULTURE X 2,   STAT     07/23/18 1804   Signed and Held  HIV antibody (Routine Testing)  Once,   R     Signed and Held   Signed and Held  Basic metabolic panel  Tomorrow morning,   R     Signed and Held   Signed and Held  CBC  Tomorrow morning,   R     Signed and Held          Vitals/Pain Today's Vitals   07/23/18 2000 07/23/18 2024 07/23/18 2025 07/23/18 2030  BP: 107/70   111/72  Pulse: (!) 111 (!) 108 (!) 112 (!) 115  Resp:   17 (!) 25  Temp:  99.9 F (37.7 C)    TempSrc:  Oral    SpO2: 93% 95% 93% 93%  Weight:      Height:      PainSc:        Isolation Precautions Droplet and Contact precautions  Medications Medications  vancomycin (VANCOCIN) 2,000 mg in sodium chloride 0.9 % 500 mL IVPB (2,000 mg Intravenous New Bag/Given 07/23/18 1906)  pantoprazole (PROTONIX) 80 mg in sodium chloride 0.9 % 250 mL (0.32 mg/mL) infusion (8 mg/hr Intravenous Not Given 07/23/18 2021)  pantoprazole (PROTONIX) injection 40 mg (has no administration in time range)  nicotine (NICODERM CQ - dosed in mg/24 hours) patch 21 mg (has no administration in time range)  pantoprazole (PROTONIX) 80 mg in sodium chloride 0.9 % 250 mL (0.32 mg/mL) infusion (has no administration in time range)  potassium chloride 10 mEq in 100 mL IVPB (has no administration in time range)  vancomycin (VANCOCIN) 1,500 mg in sodium chloride 0.9 % 500 mL IVPB (has no administration  in time range)  ceFEPIme (MAXIPIME) 2 g in sodium chloride 0.9 % 100 mL IVPB (has no administration in time range)  albuterol (PROVENTIL HFA;VENTOLIN HFA) 108 (90 Base) MCG/ACT inhaler 2 puff (2 puffs Inhalation Given 07/23/18 1941)  piperacillin-tazobactam (ZOSYN) IVPB 3.375 g (0 g Intravenous Stopped 07/23/18 2020)  ondansetron (ZOFRAN) injection 4 mg (4 mg Intravenous Given 07/23/18 1936)  pantoprazole (PROTONIX) 80 mg in sodium chloride 0.9 % 100 mL IVPB (0 mg Intravenous Stopped 07/23/18 2100)  iohexol (OMNIPAQUE) 300 MG/ML solution 75 mL (75 mLs Intravenous Contrast Given 07/23/18 2056)    Mobility walks Low fall risk   Focused Assessments Pulmonary Assessment Handoff:  Lung sounds:   O2 Device: Room Air        R Recommendations: See Admitting Provider Note  Report given to:   Additional  Notes: possible COVID

## 2018-07-23 NOTE — Progress Notes (Signed)
Advanced care plan. Purpose of the Encounter: CODE STATUS Parties in Attendance: Patient Patient's Decision Capacity: Good Subjective/Patient's story:  Darrell Santiago  is a 53 y.o. male with a known history of hyperlipidemia, hypertension, GERD, depression, seasonal allergies presented to the emergency room for shortness of breath, cough and fever.  Been going on for 1 week Objective/Medical story Patient worked up in the emergency room chest x-ray revealed pneumonia.  Needs antibiotic therapy and check for coronavirus.  Had an episode of vomiting of blood needs gastroenterology evaluation too. Goals of care determination:  Advance care directives goals of care and treatment plan discussed.  Patient currently wants everything done which includes CPR, intubation ventilator if the need arises CODE STATUS: Full code Time spent discussing advanced care planning: 16 minutes

## 2018-07-23 NOTE — ED Provider Notes (Signed)
[facility] Emergency Department Provider Note  ____________________________________________  Time seen: Approximately 6:09 PM  I have reviewed the triage vital signs and the nursing notes.   HISTORY  Chief Complaint Shortness of Breath    HPI Darrell Santiago is a 53 y.o. male with a history of COPD, recent right thoracentesis for pleural effusion, presenting for shortness of breath.  The patient was seen here 07/18/2018 for shortness of breath and left AGAINST MEDICAL ADVICE prior to his work-up being complete.  Reports that for the past several days, his shortness of breath has worsened.  He has been taking albuterol without improvement.  He has had fever, including today, up to 100.4.  He has had congestion without rhinorrhea.  No sore throat or ear pain.  He has had nausea and vomiting without abdominal pain; no diarrhea or constipation.  Past Medical History:  Diagnosis Date  . Abnormal EKG   . Allergy    Seasonal  . Anxiety   . Chicken pox   . Depression   . GERD (gastroesophageal reflux disease)   . Hyperlipidemia   . Hypertension   . Urine incontinence     Patient Active Problem List   Diagnosis Date Noted  . Bilateral impacted cerumen 05/29/2018  . Pancreatitis 03/02/2018  . Fatty liver 01/19/2018  . Stress fracture of left foot 01/19/2018  . Elevated liver enzymes 09/19/2017  . Hyperlipidemia 09/19/2017  . Macrocytosis without anemia 06/05/2017  . Anxiety and depression 04/13/2017  . HTN (hypertension) 04/13/2017  . Abnormal EKG 04/13/2017  . Smoking 02/08/2017  . Chest pain 02/08/2017  . Skin mass 08/12/2016  . Neuropathy 02/05/2015  . GERD (gastroesophageal reflux disease) 02/05/2015  . Environmental allergies 02/05/2015    Past Surgical History:  Procedure Laterality Date  . CHOLECYSTECTOMY  2000  . LEFT HEART CATH AND CORONARY ANGIOGRAPHY N/A 01/31/2017   Procedure: LEFT HEART CATH AND CORONARY ANGIOGRAPHY;  Surgeon: Dionisio David, MD;  Location: [facility];  Service: Cardiovascular;  Laterality: N/A;    Current Outpatient Rx  . Order #: [id number] Class: Historical Med  . Order #: [id number] Class: Historical Med  . Order #: [id number] Class: Normal  . Order #: [id number] Class: Normal  . Order #: [id number] Class: Normal  . Order #: [id number] Class: Historical Med  . Order #: [id number] Class: Historical Med  . Order #: [id number] Class: Historical Med  . Order #: [id number] Class: Normal  . Order #: [id number] Class: Normal  . Order #: [id number] Class: Normal  . Order #: [id number] Class: Normal  . Order #: [id number] Class: Historical Med    Allergies Patient has no known allergies.  Family History  Problem Relation Age of Onset  . Alcohol abuse Father   . Diabetes Father   . Cancer Father        ?stage IV thyroid with met  . Cancer Maternal Grandfather        Colon Cancer    Social History Social History   Tobacco Use  . Smoking status: Current Every Day Smoker    Packs/day: 1.00    Types: Cigarettes  . Smokeless tobacco: Never Used  Substance Use Topics  . Alcohol use: Yes    Alcohol/week: 21.0 standard drinks    Types: 21 Shots of liquor per week    Comment: 1 pt will last 1 week   . Drug use: No    Review of Systems Constitutional: As of a fever.  Positive general malaise. Eyes: No visual changes.  No eye discharge. ENT: No  sore throat.  No ear pain.  Positive congestion and rhinorrhea. Cardiovascular: Denies chest pain. Denies palpitations. Respiratory: Positive significant and progressive shortness of breath.  Positive cough. Gastrointestinal: No abdominal pain.  Positive nausea, positive vomiting.  No diarrhea.  No constipation. Genitourinary: Negative for dysuria. Musculoskeletal: Negative for back pain. Skin: Negative for rash. Neurological: Negative for headaches. No focal numbness, tingling or weakness.     ____________________________________________   PHYSICAL  EXAM:  VITAL SIGNS: ED Triage Vitals [07/23/18 1744]  Enc Vitals Group     BP 114/84     Pulse Rate (!) 115     Resp (!) 24     Temp (!) 96.7 F (35.9 C)     Temp Source Axillary     SpO2 93 %     Weight      Height      Head Circumference      Peak Flow      Pain Score      Pain Loc      Pain Edu?      Excl. in [place]?     Constitutional: Alert and oriented. Answers questions appropriately.  The patient is significantly uncomfortable, tachypneic, and leaning forward to catch his breath. Eyes: Conjunctivae are normal.  EOMI. No scleral icterus.  No eye discharge. Head: Atraumatic. Neck: No stridor.  Supple.  No JVD.  No meningismus. Cardiovascular: Fast rate, regular rhythm. No murmurs, rubs or gallops.  Respiratory: Patient has a fast respiratory rate with a prolonged expiratory phase.  He has rales in the right lung base.  No wheezes or rhonchi. Gastrointestinal: Overweight.  Soft, nontender and nondistended.  No guarding or rebound.  No peritoneal signs. Musculoskeletal: No LE edema. No ttp in the calves or palpable cords.  Negative Homan's sign. Neurologic:  A&Ox3.  Speech is clear.  Face and smile are symmetric.  EOMI.  Moves all extremities well. Skin:  Skin is warm, dry and intact. No rash noted. Psychiatric: Mood and affect are normal. Speech and behavior are normal.  Normal judgement.  ____________________________________________   LABS (all labs ordered are listed, but only abnormal results are displayed)  Labs Reviewed  LACTIC ACID, PLASMA - Abnormal; Notable for the following components:      Result Value   Lactic Acid, Venous 2.0 (*)    All other components within normal limits  COMPREHENSIVE METABOLIC PANEL - Abnormal; Notable for the following components:   Potassium 3.3 (*)    Glucose, Bld 130 (*)    Creatinine, Ser 0.45 (*)    AST 71 (*)    ALT 57 (*)    Alkaline Phosphatase 165 (*)    All other components within normal limits  CBC WITH  DIFFERENTIAL/PLATELET - Abnormal; Notable for the following components:   WBC 16.1 (*)    RBC 4.14 (*)    MCV 105.3 (*)    MCH 34.8 (*)    Neutro Abs 11.6 (*)    Monocytes Absolute 1.7 (*)    Abs Immature Granulocytes 0.09 (*)    All other components within normal limits  URINALYSIS, ROUTINE W REFLEX MICROSCOPIC - Abnormal; Notable for the following components:   Color, Urine STRAW (*)    APPearance CLEAR (*)    Specific Gravity, Urine 1.003 (*)    All other components within normal limits  BLOOD GAS, VENOUS - Abnormal; Notable for the following components:   pH, Ven 7.45 (*)    pCO2, Ven 40 (*)    pO2, Ven  69.0 (*)    Acid-Base Excess 3.5 (*)    All other components within normal limits  CULTURE, BLOOD (ROUTINE X 2)  CULTURE, BLOOD (ROUTINE X 2)  NOVEL CORONAVIRUS, NAA (HOSPITAL ORDER, SEND-OUT TO REF LAB)  BRAIN NATRIURETIC PEPTIDE  TROPONIN I  LACTIC ACID, PLASMA  PROCALCITONIN  PROTIME-INR  APTT  TYPE AND SCREEN   ____________________________________________  EKG  Not yet complete at time of admission.  ____________________________________________  [id number]  Dg Chest Portable 1 View  Result Date: 07/23/2018 CLINICAL DATA:  Cough and short of breath. Current treatment for pneumonia. EXAM: PORTABLE CHEST 1 VIEW COMPARISON:  07/18/2018 FINDINGS: Interval progression of infiltrate in the right lung base with small right effusion. Left lung remains clear. Cardiac enlargement without heart failure. IMPRESSION: Interval progression of right lower lobe infiltrate compatible with pneumonia. Small right pleural effusion. Electronically Signed   By: Franchot Gallo M.D.   On: 07/23/2018 18:25    ____________________________________________   PROCEDURES  Procedure(s) performed: None  Procedures  Critical Care performed: Yes, see critical care note(s) ____________________________________________   INITIAL IMPRESSION / ASSESSMENT AND PLAN / ED COURSE  Pertinent labs  & imaging results that were available during my care of the patient were reviewed by me and considered in my medical decision making (see chart for details).  53 y.o. male with COPD and recent right thoracentesis presenting with shortness of breath and cough, fever.  Overall, I am concerned that this patient is coming in with sepsis.  He is tachycardic and his respiratory rate is elevated.  We will continue to monitor his oxygen saturations; we will also do a ambulatory pulse oximetry reading.  His chest x-ray has been performed and on my read he does appear to have a right lower lobe infiltrate.  Certainly, an infectious etiology including coronavirus is a concerning possibility.  In addition, cardiac cause including CHF, ACS or MI are also possible.  A bacterial pneumonia is also on the differential and the patient will be covered with empiric antibiotics.  Sepsis protocol, with the exception of aggressive fluid hydration given that we know this makes patients with coronavirus potentially worse, has been initiated.  Anticipate admission.  ----------------------------------------- 6:43 PM on 07/23/2018 -----------------------------------------  Patient did have one episode of hematemesis here, and has been given Zofran.  I have added a type and screen to his blood work.  The patient will be tested for coronavirus.  His chest x-ray does show an interval progression to the right lower lobe, is officially read by the radiologist as well.  I will plan to talk to the hospitalist about admission at this time.  ----------------------------------------- 7:17 PM on 07/23/2018 -----------------------------------------  The patient's hemoglobin is 14.4, which is reassuring.  Protonix has been ordered.  He does have a white blood cell count of 16 and a lactic acid of 2.0.  His procalcitonin is not back yet.  He does have a mild bump in his LFTs compared to prior; this could be from the vomiting but again  coronavirus can result in elevated LFTs.  At this time, I have admitted the patient to the hospitalist for ongoing evaluation and treatment.  CRITICAL CARE Performed by: Eula Listen   Total critical care time: 40 minutes  Critical care time was exclusive of separately billable procedures and treating other patients.  Critical care was necessary to treat or prevent imminent or life-threatening deterioration.  Critical care was time spent personally by me on the following activities: development of treatment plan  with patient and/or surrogate as well as nursing, discussions with consultants, evaluation of patient's response to treatment, examination of patient, obtaining history from patient or surrogate, ordering and performing treatments and interventions, ordering and review of laboratory studies, ordering and review of radiographic studies, pulse oximetry and re-evaluation of patient's condition.   ____________________________________________  FINAL CLINICAL IMPRESSION(S) / ED DIAGNOSES  Final diagnoses:  Sepsis, due to unspecified organism, unspecified whether acute organ dysfunction present ([place])  Pneumonia of right lower lobe due to infectious organism ([place])  Hematemesis with nausea         NEW MEDICATIONS STARTED DURING THIS VISIT:  New Prescriptions   No medications on file      Eula Listen, MD 07/23/18 2055

## 2018-07-23 NOTE — Telephone Encounter (Signed)
Spoke with triage nurse and advised patient should return to ED with increased temp and bloody sputum and not able to pulmonology whom prescribed the ABX and DX pneumonia.

## 2018-07-23 NOTE — Progress Notes (Addendum)
CODE SEPSIS - PHARMACY COMMUNICATION  **Broad Spectrum Antibiotics should be administered within 1 hour of Sepsis diagnosis**  Time Code Sepsis Called/Page Received: 1804  Antibiotics Ordered: Vancomycin and Zosyn  Time of 1st antibiotic administration: 1906  Additional action taken by pharmacy: *Called ED -no antibiotics charted as given as of 19:02. Was told all the nurse were in shift change.   If necessary, Name of Provider/Nurse Contacted: Did not identify herself.    Presley Gora ,PharmD Clinical Pharmacist  07/23/2018  6:15 PM

## 2018-07-23 NOTE — Progress Notes (Signed)
Pharmacy Antibiotic Note  Darrell Santiago is a 53 y.o. male admitted on 07/23/2018 with sepsis.  Pharmacy has been consulted for vancomycin and cefepime  dosing.  Plan: Cefepime 2gm every 8 hours Vancomycin 2000mg  loading dose then Vancomycin 1000 mg IV Q 12 hrs. Goal AUC 400-550. Expected AUC: 475 SCr used: 0.8  Height: 5\' 10"  (177.8 cm) Weight: 195 lb (88.5 kg) IBW/kg (Calculated) : 73  Temp (24hrs), Avg:98.8 F (37.1 C), Min:96.7 F (35.9 C), Max:99.9 F (37.7 C)  Recent Labs  Lab 07/23/18 1826  WBC 16.1*  CREATININE 0.45*  LATICACIDVEN 2.0*    Estimated Creatinine Clearance: 123.8 mL/min (A) (by C-G formula based on SCr of 0.45 mg/dL (L)).    No Known Allergies  Antimicrobials this admission: 4/6 Zosyn >> 4/6 4/6 Vancomycin >>  4/6 Cefepime >>  Microbiology results: 4/6 BCx: pending 4/6 MRSA PCR: pending 4/6 Coronavirus: pending  Thank you for allowing pharmacy to be a part of this patient's care.  Alp Goldwater 07/23/2018 8:27 PM

## 2018-07-24 ENCOUNTER — Inpatient Hospital Stay: Payer: BLUE CROSS/BLUE SHIELD

## 2018-07-24 DIAGNOSIS — Z9049 Acquired absence of other specified parts of digestive tract: Secondary | ICD-10-CM

## 2018-07-24 DIAGNOSIS — J918 Pleural effusion in other conditions classified elsewhere: Secondary | ICD-10-CM

## 2018-07-24 DIAGNOSIS — Z7289 Other problems related to lifestyle: Secondary | ICD-10-CM

## 2018-07-24 DIAGNOSIS — I10 Essential (primary) hypertension: Secondary | ICD-10-CM

## 2018-07-24 DIAGNOSIS — J189 Pneumonia, unspecified organism: Secondary | ICD-10-CM

## 2018-07-24 DIAGNOSIS — Z8719 Personal history of other diseases of the digestive system: Secondary | ICD-10-CM

## 2018-07-24 DIAGNOSIS — F17210 Nicotine dependence, cigarettes, uncomplicated: Secondary | ICD-10-CM

## 2018-07-24 LAB — MRSA PCR SCREENING: MRSA by PCR: NEGATIVE

## 2018-07-24 LAB — CBC
HCT: 42 % (ref 39.0–52.0)
Hemoglobin: 13.9 g/dL (ref 13.0–17.0)
MCH: 34.9 pg — ABNORMAL HIGH (ref 26.0–34.0)
MCHC: 33.1 g/dL (ref 30.0–36.0)
MCV: 105.5 fL — ABNORMAL HIGH (ref 80.0–100.0)
Platelets: 299 10*3/uL (ref 150–400)
RBC: 3.98 MIL/uL — ABNORMAL LOW (ref 4.22–5.81)
RDW: 12.9 % (ref 11.5–15.5)
WBC: 13.3 10*3/uL — ABNORMAL HIGH (ref 4.0–10.5)
nRBC: 0 % (ref 0.0–0.2)

## 2018-07-24 LAB — HEMOGLOBIN AND HEMATOCRIT, BLOOD
HCT: 39.3 % (ref 39.0–52.0)
HCT: 40.7 % (ref 39.0–52.0)
Hemoglobin: 13.1 g/dL (ref 13.0–17.0)
Hemoglobin: 13.5 g/dL (ref 13.0–17.0)

## 2018-07-24 LAB — PROCALCITONIN: Procalcitonin: <0.1 ng/mL

## 2018-07-24 LAB — BASIC METABOLIC PANEL
Anion gap: 12 (ref 5–15)
BUN: 7 mg/dL (ref 6–20)
CO2: 24 mmol/L (ref 22–32)
Calcium: 8.8 mg/dL — ABNORMAL LOW (ref 8.9–10.3)
Chloride: 103 mmol/L (ref 98–111)
Creatinine, Ser: 0.52 mg/dL — ABNORMAL LOW (ref 0.61–1.24)
GFR calc Af Amer: >60 mL/min (ref >60)
GFR calc non Af Amer: >60 mL/min (ref >60)
Glucose, Bld: 100 mg/dL — ABNORMAL HIGH (ref 70–99)
Potassium: 4.1 mmol/L (ref 3.5–5.1)
Sodium: 139 mmol/L (ref 135–145)

## 2018-07-24 LAB — C-REACTIVE PROTEIN: CRP: 11.4 mg/dL — ABNORMAL HIGH (ref <1.0)

## 2018-07-24 LAB — LACTATE DEHYDROGENASE: LDH: 137 U/L (ref 98–192)

## 2018-07-24 LAB — STREP PNEUMONIAE URINARY ANTIGEN: Strep Pneumo Urinary Antigen: NEGATIVE

## 2018-07-24 MED ORDER — IOHEXOL 240 MG/ML SOLN
25.0000 mL | INTRAMUSCULAR | Status: AC
  Administered 2018-07-24 (×2): 25 mL via ORAL

## 2018-07-24 MED ORDER — GUAIFENESIN-DM 100-10 MG/5ML PO SYRP
5.0000 mL | ORAL_SOLUTION | ORAL | Status: DC | PRN
  Administered 2018-07-24: 5 mL via ORAL
  Filled 2018-07-24: qty 5

## 2018-07-24 MED ORDER — IOHEXOL 300 MG/ML  SOLN
100.0000 mL | Freq: Once | INTRAMUSCULAR | Status: AC | PRN
  Administered 2018-07-24: 100 mL via INTRAVENOUS

## 2018-07-24 MED ORDER — LORAZEPAM 0.5 MG PO TABS
0.5000 mg | ORAL_TABLET | Freq: Four times a day (QID) | ORAL | Status: DC | PRN
  Administered 2018-07-24 – 2018-07-27 (×13): 0.5 mg via ORAL
  Filled 2018-07-24 (×13): qty 1

## 2018-07-24 MED ORDER — PANTOPRAZOLE SODIUM 40 MG PO TBEC
40.0000 mg | DELAYED_RELEASE_TABLET | Freq: Every day | ORAL | Status: DC
  Administered 2018-07-24 – 2018-07-27 (×4): 40 mg via ORAL
  Filled 2018-07-24 (×4): qty 1

## 2018-07-24 MED ORDER — AZITHROMYCIN 500 MG PO TABS
500.0000 mg | ORAL_TABLET | Freq: Every day | ORAL | Status: DC
  Administered 2018-07-24 – 2018-07-26 (×3): 500 mg via ORAL
  Filled 2018-07-24: qty 2
  Filled 2018-07-24: qty 1
  Filled 2018-07-24: qty 2

## 2018-07-24 MED ORDER — LORAZEPAM 0.5 MG PO TABS
0.5000 mg | ORAL_TABLET | Freq: Every day | ORAL | Status: DC | PRN
  Administered 2018-07-24: 0.5 mg via ORAL
  Filled 2018-07-24: qty 1

## 2018-07-24 NOTE — Progress Notes (Signed)
Patient is experiencing hemoptysis. Dark brown tinged sputum. Labs are in place to monitor hemoglobin level. Patient's heart rate increases to 140's while coughing. Will administer cough suppressant. Will continue to monitor and assess/

## 2018-07-24 NOTE — Telephone Encounter (Signed)
He needs to go back to the hospital if still running fever on antibiotics and having SOB   [place]

## 2018-07-24 NOTE — Progress Notes (Signed)
Patient with fever 101.1, tylenol given. Recheck one hour later was 100.1. Patient sitting on edge of bed. No acute complaints, states he just doesn't feel good overall. Dr. Tressia Miners notified. Will continue to monitor.

## 2018-07-24 NOTE — Progress Notes (Signed)
[facility] [place] NAME: Darrell Santiago    MR#:  [id number]  DATE OF BIRTH:  14-Jul-1965  SUBJECTIVE:  CHIEF COMPLAINT:   Chief Complaint  Patient presents with  . Shortness of Breath   -Recent right-sided thoracentesis as outpatient 1 week ago for pleural effusion.  Comes in with worsening shortness of breath. -Currently needing oxygen.  Feels slightly better.  Very anxious. -Pulmonary consult is pending.  COVID-19 test is pending  REVIEW OF SYSTEMS:  Review of Systems  Constitutional: Positive for malaise/fatigue. Negative for chills and fever.  HENT: Negative for congestion, ear discharge, hearing loss and nosebleeds.   Eyes: Negative for blurred vision and double vision.  Respiratory: Positive for cough, shortness of breath and wheezing.   Cardiovascular: Negative for chest pain, palpitations and leg swelling.  Gastrointestinal: Positive for nausea. Negative for abdominal pain, constipation, diarrhea and vomiting.  Genitourinary: Negative for dysuria.  Musculoskeletal: Positive for myalgias.  Neurological: Negative for dizziness, focal weakness, seizures, weakness and headaches.  Psychiatric/Behavioral: Negative for depression.    DRUG ALLERGIES:  No Known Allergies  VITALS:  Blood pressure 132/86, pulse 94, temperature 99.5 F (37.5 C), temperature source Oral, resp. rate (!) 21, height 5\' 10"  (1.778 m), weight 85.7 kg, SpO2 98 %.  PHYSICAL EXAMINATION:  Physical Exam   GENERAL:  53 y.o.-year-old patient lying in the bed with no acute distress.  EYES: Pupils equal, round, reactive to light and accommodation. No scleral icterus. Extraocular muscles intact.  HEENT: Head atraumatic, normocephalic. Oropharynx and nasopharynx clear.  Flushed face noted NECK:  Supple, no jugular venous distention. No thyroid enlargement, no tenderness.  LUNGS:no wheezing, rales,rhonchi or crepitation. No use of accessory muscles of respiration.   Significantly decreased right-sided breath sounds. CARDIOVASCULAR: S1, S2 normal. No murmurs, rubs, or gallops.  ABDOMEN: Soft, nontender, nondistended. Bowel sounds present. No organomegaly or mass.  EXTREMITIES: No pedal edema, cyanosis, or clubbing.  NEUROLOGIC: Cranial nerves II through XII are intact. Muscle strength 5/5 in all extremities. Sensation intact. Gait not checked.  PSYCHIATRIC: The patient is alert and oriented x 3.  SKIN: No obvious rash, lesion, or ulcer.    LABORATORY PANEL:   CBC Recent Labs  Lab 07/24/18 0326 07/24/18 1009  WBC 13.3*  --   HGB 13.9 13.1  HCT 42.0 39.3  PLT 299  --    ------------------------------------------------------------------------------------------------------------------  Chemistries  Recent Labs  Lab 07/23/18 1826 07/24/18 0326  NA 139 139  K 3.3* 4.1  CL 101 103  CO2 25 24  GLUCOSE 130* 100*  BUN 9 7  CREATININE 0.45* 0.52*  CALCIUM 9.2 8.8*  AST 71*  --   ALT 57*  --   ALKPHOS 165*  --   BILITOT 0.7  --    ------------------------------------------------------------------------------------------------------------------  Cardiac Enzymes Recent Labs  Lab 07/23/18 1826  TROPONINI <0.03   ------------------------------------------------------------------------------------------------------------------  RADIOLOGY:  Ct Chest W Contrast  Result Date: 07/23/2018 CLINICAL DATA:  Shortness of breath, cough and fever over the last week. Abnormal chest radiography with right pneumonia. EXAM: CT CHEST WITH CONTRAST TECHNIQUE: Multidetector CT imaging of the chest was performed during intravenous contrast administration. CONTRAST:  42mL OMNIPAQUE IOHEXOL 300 MG/ML  SOLN COMPARISON:  Chest radiography same day and 07/18/2018 FINDINGS: Cardiovascular: Heart size is normal. There is mild coronary artery calcification. There is mild aortic atherosclerosis. Mediastinum/Nodes: No mediastinal or hilar mass or lymphadenopathy.  Lungs/Pleura: Left chest does not show any pleural effusion. There are some  mild hazy areas of airspace filling in the left lower lobe consistent with early bronchopneumonia. On the right, there is a pleural effusion layering dependently, moderate in size. Thickness is on average 3 cm. There is airspace filling in the right lower lobe and right middle lobe consistent with bronchopneumonia. Upper lobe is largely spared. Upper Abdomen: There is a markedly abnormal appearance of low-density disease in the retrocrural region, diaphragmatic hiatus and upper abdominal retroperitoneum with multiple low-density lesions most consistent with necrotic lymphadenopathy. Processes adjacent to the pancreas but I do not definitely identify a pancreatic origin. I would suggest complete evaluation of the abdomen with CT initially. MRI may be useful at some point. Also incidental is noted to be a 1 cm left adrenal. Musculoskeletal: Negative IMPRESSION: Right effusion layering dependently, moderate in size. Airspace filling process in the right lower lobe and right middle lobe most consistent with bronchopneumonia. This is nonspecific and could be bacterial or viral. Early airspace filling, much less advanced, in the left lower lobe. Markedly abnormal process in the lower mediastinum and retrocrural region and quite extensive within the upper abdominal retroperitoneum, with areas of low-density with indistinct margins. Findings are presumed represent necrotic adenopathy/tumor. Atypical infection could conceivably have this appearance. The etiology is indeterminate at this time, and I would recommend complete abdominal CT initially. It is possible left MRI might be useful at some point. Electronically Signed   By: Nelson Chimes M.D.   On: 07/23/2018 21:21   Dg Chest Portable 1 View  Result Date: 07/23/2018 CLINICAL DATA:  Cough and short of breath. Current treatment for pneumonia. EXAM: PORTABLE CHEST 1 VIEW COMPARISON:  07/18/2018  FINDINGS: Interval progression of infiltrate in the right lung base with small right effusion. Left lung remains clear. Cardiac enlargement without heart failure. IMPRESSION: Interval progression of right lower lobe infiltrate compatible with pneumonia. Small right pleural effusion. Electronically Signed   By: Franchot Gallo M.D.   On: 07/23/2018 18:25    EKG:   Orders placed or performed during the hospital encounter of 07/23/18  . ED EKG 12-Lead  . ED EKG 12-Lead    ASSESSMENT AND PLAN:   53 year old male with past medical history significant for allergies, undiagnosed COPD/asthma not on home oxygen, GERD, hypertension presents to hospital secondary to worsening shortness of breath.  1.  Right-sided bronchopneumonia-patient had pleural effusion on the right side last week status post thoracentesis and 1.2 L removed. -Seen by pulmonology as outpatient and was started on Augmentin.  CT chest on admission showing significant right middle and lower lobe bronchopneumonia -Currently on cefepime.  Azithromycin added for atypical coverage.  MRSA PCR is negative, vancomycin is discontinued. -Pulmonary consult -Supplemental oxygen as needed. -Given his symptoms, COVID-19 test is pending at this time  2.  Hemoptysis-no hematemesis identified.  Hemoglobin is stable.  Discontinue GI consult. -Change Protonix to oral daily -Hemoptysis secondary to bronchopneumonia.  Monitor.  3.  Tobacco use disorder-likely has undiagnosed COPD.  Started on inhalers as outpatient.  Continue to follow-up with pulmonary as outpatient for PFTs. -On nicotine patch  4.  DVT prophylaxis-teds and SCDs only, given hemoptysis.   Discussed with his wife over the phone.  All the records are reviewed and case discussed with Care Management/Social Workerr. Management plans discussed with the patient, family and they are in agreement.  CODE STATUS: Full code  TOTAL TIME TAKING CARE OF THIS PATIENT: 39 minutes.    POSSIBLE D/C IN 2-3 DAYS, DEPENDING ON CLINICAL CONDITION.   Hart [place]  Mearle Drew M.D on 07/24/2018 at 3:07 PM  Between 7am to 6pm - Pager - [phone]  After 6pm go to [email or web address] - password [email or web address] [facility]  Office  [phone]  CC: Primary care physician; McLean-Scocuzza, Nino Glow, MD

## 2018-07-24 NOTE — Telephone Encounter (Signed)
He needs to go back to the hospital if still running fever on antibiotics and having SOB   [place]      Documentation

## 2018-07-24 NOTE — Consult Note (Signed)
Pulmonary Medicine          Date: 07/24/2018,   MRN# [id number] Darrell Santiago 1965/08/03     AdmissionWeight: 88.5 kg                 CurrentWeight: 85.7 kg      CHIEF COMPLAINT:   Pneumonia    HISTORY OF PRESENT ILLNESS   This is a pleasant 53 year old male with a history of severe COPD as well as seasonal allergic rhinitis, GERD, depression, generalized anxiety disorder, dyslipidemia, essential hypertension, urge incontinence, patient is well-known to our service and is being seen by Dr. Raul Del at [facility].  Patient was recently seen for moderate COPD exacerbation with possible community-acquired pneumonia and prescribed Augmentin at outpatient.  He had a CT chest done which shows right pleural effusion with layering suggestive of pneumonia.  When patient was seen a week ago he had been sent for thoracentesis with pleural fluid studies showing exudative neutrophil predominant fluid which was negative for AFB as well as KOH staining as well as cytology negative for malignancy and showing reactive mesothelial cells.  I was asked to see patient by hospitalist service due to severe COPD with current admission for pneumonia and pleural effusion possible empyema.  Patient is an active smoker does not have a history of CHF, during my evaluation today he did not have peripheral edema.  Patient does have poor dentition.  Patient has a productive cough and has produced non-massive hemoptysis while coughing.  I will send a specimen cup to microbiology. While taking Tylenol temperature has been over 99 during this admission. ient has cough that is productive e does not have a history of CHF sked to see patient by hospitalist service due to severe COPD as well as PAST MEDICAL HISTORY   Past Medical History:  Diagnosis Date  . Abnormal EKG   . Allergy    Seasonal  . Anxiety   . Chicken pox   . Depression   . GERD (gastroesophageal reflux disease)   . Hyperlipidemia    . Hypertension   . Urine incontinence      SURGICAL HISTORY   Past Surgical History:  Procedure Laterality Date  . CHOLECYSTECTOMY  2000  . LEFT HEART CATH AND CORONARY ANGIOGRAPHY N/A 01/31/2017   Procedure: LEFT HEART CATH AND CORONARY ANGIOGRAPHY;  Surgeon: Dionisio David, MD;  Location: [facility];  Service: Cardiovascular;  Laterality: N/A;     FAMILY HISTORY   Family History  Problem Relation Age of Onset  . Alcohol abuse Father   . Diabetes Father   . Cancer Father        ?stage IV thyroid with met  . Cancer Maternal Grandfather        Colon Cancer     SOCIAL HISTORY   Social History   Tobacco Use  . Smoking status: Current Every Day Smoker    Packs/day: 1.00    Types: Cigarettes  . Smokeless tobacco: Never Used  Substance Use Topics  . Alcohol use: Yes    Alcohol/week: 21.0 standard drinks    Types: 21 Shots of liquor per week    Comment: 1 pt will last 1 week   . Drug use: No     MEDICATIONS    Home Medication:    Current Medication:  Current Facility-Administered Medications:  .  0.9 %  sodium chloride infusion, , Intravenous, Continuous, Pyreddy, Pavan, MD, Last Rate: 75 mL/hr at 07/23/18 2244 .  0.9 %  sodium chloride infusion, , Intravenous, PRN, Pyreddy, Pavan, MD, Last Rate: 10 mL/hr at 07/23/18 2347 .  acetaminophen (TYLENOL) tablet 650 mg, 650 mg, Oral, Q6H PRN **OR** acetaminophen (TYLENOL) suppository 650 mg, 650 mg, Rectal, Q6H PRN, Pyreddy, Pavan, MD .  ceFEPIme (MAXIPIME) 2 g in sodium chloride 0.9 % 100 mL IVPB, 2 g, Intravenous, Q8H, Pyreddy, Pavan, MD, Last Rate: 200 mL/hr at 07/24/18 0503, 2 g at 07/24/18 0503 .  guaiFENesin-dextromethorphan (ROBITUSSIN DM) 100-10 MG/5ML syrup 5 mL, 5 mL, Oral, Q4H PRN, Pyreddy, Pavan, MD, 5 mL at 07/24/18 0317 .  LORazepam (ATIVAN) tablet 0.5 mg, 0.5 mg, Oral, Q6H PRN, Gladstone Lighter, MD, 0.5 mg at 07/24/18 1245 .  nicotine (NICODERM CQ - dosed in mg/24 hours) patch 21 mg, 21 mg,  Transdermal, Daily, Pyreddy, Pavan, MD, 21 mg at 07/24/18 7619 .  ondansetron (ZOFRAN) tablet 4 mg, 4 mg, Oral, Q6H PRN **OR** ondansetron (ZOFRAN) injection 4 mg, 4 mg, Intravenous, Q6H PRN, Pyreddy, Pavan, MD .  pantoprazole (PROTONIX) EC tablet 40 mg, 40 mg, Oral, Daily, Gladstone Lighter, MD, 40 mg at 07/24/18 0922 .  senna-docusate (Senokot-S) tablet 1 tablet, 1 tablet, Oral, QHS PRN, Saundra Shelling, MD    ALLERGIES   Patient has no known allergies.     REVIEW OF SYSTEMS    Review of Systems:  Gen:  Denies  fever, sweats, chills weigh loss  HEENT: Denies blurred vision, double vision, ear pain, eye pain, hearing loss, nose bleeds, sore throat Cardiac:  No dizziness, chest pain or heaviness, chest tightness,edema Resp: Admits to cough with non-massive hemoptysis Gi: Denies swallowing difficulty, stomach pain, nausea or vomiting, diarrhea, constipation, bowel incontinence Gu:  Denies bladder incontinence, burning urine Ext:   Denies Joint pain, stiffness or swelling Skin: Denies  skin rash, easy bruising or bleeding or hives Endoc:  Denies polyuria, polydipsia , polyphagia or weight change Psych:   Denies depression, insomnia or hallucinations   Other:  All other systems negative   VS: BP 132/86 (BP Location: Left Arm)   Pulse 94   Temp 99.5 F (37.5 C) (Oral)   Resp (!) 21   Ht 5' 10" (1.778 m)   Wt 85.7 kg   SpO2 98%   BMI 27.12 kg/m      PHYSICAL EXAM    GENERAL:NAD, no fevers, chills, no weakness no fatigue HEAD: Normocephalic, atraumatic.  EYES: Pupils equal, round, reactive to light. Extraocular muscles intact. No scleral icterus.  MOUTH: Moist mucosal membrane. Dentition intact. No abscess noted.  EAR, NOSE, THROAT: Clear without exudates. No external lesions.  NECK: Supple. No thyromegaly. No nodules. No JVD.  PULMONARY: Mild rhonchorous breath sounds bilaterally with decreased breath sounds at the bases CARDIOVASCULAR: S1 and S2. Regular rate and  rhythm. No murmurs, rubs, or gallops. No edema. Pedal pulses 2+ bilaterally.  GASTROINTESTINAL: Soft, nontender, nondistended. No masses. Positive bowel sounds. No hepatosplenomegaly.  MUSCULOSKELETAL: No swelling, clubbing, or edema. Range of motion full in all extremities.  NEUROLOGIC: Cranial nerves II through XII are intact. No gross focal neurological deficits. Sensation intact. Reflexes intact.  SKIN: No ulceration, lesions, rashes, or cyanosis. Skin warm and dry. Turgor intact.  PSYCHIATRIC: Mood, affect within normal limits. The patient is awake, alert and oriented x 3. Insight, judgment intact.       IMAGING    Ct Chest W Contrast  Result Date: 07/23/2018 CLINICAL DATA:  Shortness of breath, cough and fever over the last week. Abnormal chest radiography with right  pneumonia. EXAM: CT CHEST WITH CONTRAST TECHNIQUE: Multidetector CT imaging of the chest was performed during intravenous contrast administration. CONTRAST:  48m OMNIPAQUE IOHEXOL 300 MG/ML  SOLN COMPARISON:  Chest radiography same day and 07/18/2018 FINDINGS: Cardiovascular: Heart size is normal. There is mild coronary artery calcification. There is mild aortic atherosclerosis. Mediastinum/Nodes: No mediastinal or hilar mass or lymphadenopathy. Lungs/Pleura: Left chest does not show any pleural effusion. There are some mild hazy areas of airspace filling in the left lower lobe consistent with early bronchopneumonia. On the right, there is a pleural effusion layering dependently, moderate in size. Thickness is on average 3 cm. There is airspace filling in the right lower lobe and right middle lobe consistent with bronchopneumonia. Upper lobe is largely spared. Upper Abdomen: There is a markedly abnormal appearance of low-density disease in the retrocrural region, diaphragmatic hiatus and upper abdominal retroperitoneum with multiple low-density lesions most consistent with necrotic lymphadenopathy. Processes adjacent to the pancreas  but I do not definitely identify a pancreatic origin. I would suggest complete evaluation of the abdomen with CT initially. MRI may be useful at some point. Also incidental is noted to be a 1 cm left adrenal. Musculoskeletal: Negative IMPRESSION: Right effusion layering dependently, moderate in size. Airspace filling process in the right lower lobe and right middle lobe most consistent with bronchopneumonia. This is nonspecific and could be bacterial or viral. Early airspace filling, much less advanced, in the left lower lobe. Markedly abnormal process in the lower mediastinum and retrocrural region and quite extensive within the upper abdominal retroperitoneum, with areas of low-density with indistinct margins. Findings are presumed represent necrotic adenopathy/tumor. Atypical infection could conceivably have this appearance. The etiology is indeterminate at this time, and I would recommend complete abdominal CT initially. It is possible left MRI might be useful at some point. Electronically Signed   By: MNelson ChimesM.D.   On: 07/23/2018 21:21   Dg Chest Portable 1 View  Result Date: 07/23/2018 CLINICAL DATA:  Cough and short of breath. Current treatment for pneumonia. EXAM: PORTABLE CHEST 1 VIEW COMPARISON:  07/18/2018 FINDINGS: Interval progression of infiltrate in the right lung base with small right effusion. Left lung remains clear. Cardiac enlargement without heart failure. IMPRESSION: Interval progression of right lower lobe infiltrate compatible with pneumonia. Small right pleural effusion. Electronically Signed   By: CFranchot GalloM.D.   On: 07/23/2018 18:25   Dg Chest Port 1 View  Result Date: 07/18/2018 CLINICAL DATA:  Shortness of breath for 2 days. Recent thoracentesis. EXAM: PORTABLE CHEST 1 VIEW COMPARISON:  Chest radiograph July 16, 2018 FINDINGS: Cardiac silhouette is upper limits of normal and unchanged. Mediastinal silhouette is unremarkable. Small RIGHT pleural effusion, decreased from  prior examination. No pleural effusion or focal consolidation. No pneumothorax. Soft tissue planes and included osseous structures are nonacute; bilateral acromioclavicular undersurface spurring associated with impingement. IMPRESSION: 1. Small RIGHT pleural effusion, decreased from prior examination. 2. Borderline cardiomegaly. Electronically Signed   By: CElon AlasM.D.   On: 07/18/2018 19:34   Dg Chest Port 1 View  Result Date: 07/16/2018 CLINICAL DATA:  Status post thoracentesis EXAM: PORTABLE CHEST 1 VIEW COMPARISON:  Films from earlier in the same day. FINDINGS: Cardiac shadow is within normal limits. Significant reduction in right-sided pleural effusion is noted. No pneumothorax is identified. Minimal right basilar atelectasis is seen. Left lung remains clear. No bony abnormality is seen. IMPRESSION: Significant reduction in right-sided pleural effusion following thoracentesis. No pneumothorax is seen. Electronically Signed   By:  Inez Catalina M.D.   On: 07/16/2018 15:18   US Thoracentesis Asp Pleural Space W/img Guide  Result Date: 07/16/2018 INDICATION: Right-sided thoracentesis EXAM: ULTRASOUND GUIDED RIGHT THORACENTESIS MEDICATIONS: None. COMPLICATIONS: None immediate. PROCEDURE: An ultrasound guided thoracentesis was thoroughly discussed with the patient and questions answered. The benefits, risks, alternatives and complications were also discussed. The patient understands and wishes to proceed with the procedure. Written consent was obtained. Ultrasound was performed to localize and mark an adequate pocket of fluid in the right chest. The area was then prepped and draped in the normal sterile fashion. 1% Lidocaine was used for local anesthesia. Under ultrasound guidance a 6 Fr Safe-T-Centesis catheter was introduced. Thoracentesis was performed. The catheter was removed and a dressing applied. FINDINGS: A total of approximately 1200 mL of yellow fluid was removed. Samples were sent to the  laboratory as requested by the clinical team. IMPRESSION: Successful ultrasound guided right thoracentesis yielding 1.2 L of pleural fluid. Electronically Signed   By: Inez Catalina M.D.   On: 07/16/2018 15:16      ASSESSMENT/PLAN    Acute respiratory distress due to right-sided pneumonia          -Status post initial thoracentesis with 1200 cc-pleural fluid negative for cytology with exudative neutrophilic predominance profile          -Patient with severe COPD and longtime smoker- some risk for CHF-transthoracic echo ordered          -Currently covid rule out, will also order Legionella, strep pneumo antigen, respiratory viral panel., will add on procalcitonin, ferritin and CRP to evaluate pre-test probability for novel corona while waiting on results.           -Complicated by findings of necrotic abdominal lymphadenopathy-ordering CT abdomen and pelvis with contrast to fully characterize process         -Patient had initial improvement in symptoms post therapeutic thoracentesis.  I will reorder thoracentesis to include additional testing for chylothorax due to possible disrupted lymphatic flow secondary to abdominal process, as well as glucose, pH, repeat culture to eval for empyema, and cell block for cytology evaluation.         -Patient has non-massive hemoptysis with expectorant-have ordered expectorated sputum culture             COPD without acute exacerbation           -We will order Combivent Respimat, albuterol as needed.  No need for prednisone at this time           -Patient on Zithromax and cefepime currently    Active tobacco abuse          -On nicotine replacement via transdermal patch    Thank you for allowing me to participate in the care of this patient I will continue to follow along with you.   Patient/Family are satisfied with care plan and all questions have been answered.  This document was prepared using Dragon voice recognition software and may include  unintentional dictation errors.     Ottie Glazier, M.D.  Division of [facility]

## 2018-07-24 NOTE — Consult Note (Signed)
NAME: Darrell Santiago  DOB: 1966-03-18  MRN: [id number]  Date/Time: 07/24/2018 1:41 PM  REQUESTING PROVIDER: pyreddy Subjective:  REASON FOR CONSULT: pneumonia, ? covid ?spoke to the patient on the phone  Darrell Santiago is a 53 y.o. with a history of HTN, pancreatitis,  Presents with increasing SOB and coughing up blood - pt says he has had rt sided chest pain and sob for more than 2 weeks.He last went to work on 07/13/18. He works in a [employer] Presented to [employer] on 3/30 with SOB, weakness , low grade fever . CXR showed a large rt pleural effusion. He also saw pulmonolgist Dr.Fleming at [facility] clinic the same day and was sent for stat pleurocentesis. He had 1200 ml of fluid removed by IR and sent for studies. Cell count was 928(50% N and 46% M) LD was 350, protein was 4 grams. Only AFB and fungal culture were sent and not aerobic bacterial culture. He came to the ED on 4/1 with worsening SOB but left AMA without getting a CT chest. He followed up with Dr.Fleming on 07/19/18 who thought whether the cause of the effusion an exudate was a possible Aspiration pneumonia. He was asked to complete augmentin which was prescribed on 07/16/18 He presented to the ED on 4/6 with worsening SOB, gave a h/o of Tmax of 100.4  Vitals in the ED was 114/84, HR 115, RR 24 and Temp of 96.7 ,sats 93%, WBC was 16 , mildly elevated AST/ALT and corona virus test was sent and he was started on antibiotics- he also had CT scan  PMH  pancreatitis COPD HTN Left pleural effusion Smoker Alcohol use Peripheral neuropathy  PSH cholecystectomy  Past Medical History:  Diagnosis Date  . Abnormal EKG   . Allergy    Seasonal  . Anxiety   . Chicken pox   . Depression   . GERD (gastroesophageal reflux disease)   . Hyperlipidemia   . Hypertension   . Urine incontinence     Past Surgical History:  Procedure Laterality Date  . CHOLECYSTECTOMY  2000  . LEFT HEART CATH AND CORONARY ANGIOGRAPHY N/A 01/31/2017   Procedure: LEFT HEART CATH AND CORONARY ANGIOGRAPHY;  Surgeon: Dionisio David, MD;  Location: [facility];  Service: Cardiovascular;  Laterality: N/A;    Social History   Socioeconomic History  . Marital status: Married    Spouse name: Not on file  . Number of children: Not on file  . Years of education: Not on file  . Highest education level: Not on file  Occupational History  . Not on file  Social Needs  . Financial resource strain: Not on file  . Food insecurity:    Worry: Not on file    Inability: Not on file  . Transportation needs:    Medical: Not on file    Non-medical: Not on file  Tobacco Use  . Smoking status: Current Every Day Smoker    Packs/day: 1.00    Types: Cigarettes  . Smokeless tobacco: Never Used  Substance and Sexual Activity  . Alcohol use: Yes    Alcohol/week: 21.0 standard drinks    Types: 21 Shots of liquor per week    Comment: 1 pt will last 1 week   . Drug use: No  . Sexual activity: Yes    Partners: Female  Lifestyle  . Physical activity:    Days per week: Not on file    Minutes per session: Not on file  .  Stress: Not on file  Relationships  . Social connections:    Talks on phone: Not on file    Gets together: Not on file    Attends religious service: Not on file    Active member of club or organization: Not on file    Attends meetings of clubs or organizations: Not on file    Relationship status: Not on file  . Intimate partner violence:    Fear of current or ex partner: Not on file    Emotionally abused: Not on file    Physically abused: Not on file    Forced sexual activity: Not on file  Other Topics Concern  . 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 Alcohol abuse Father   . Diabetes Father   . Cancer Father         ?stage IV thyroid with met  . Cancer Maternal Grandfather        Colon Cancer   No Known Allergies ? Current Facility-Administered Medications  Medication Dose Route Frequency Provider Last Rate Last Dose  . 0.9 %  sodium chloride infusion   Intravenous Continuous Saundra Shelling, MD 75 mL/hr at 07/23/18 2244    . 0.9 %  sodium chloride infusion   Intravenous PRN Saundra Shelling, MD 10 mL/hr at 07/23/18 2347    . acetaminophen (TYLENOL) tablet 650 mg  650 mg Oral Q6H PRN Saundra Shelling, MD       Or  . acetaminophen (TYLENOL) suppository 650 mg  650 mg Rectal Q6H PRN Pyreddy, Pavan, MD      . ceFEPIme (MAXIPIME) 2 g in sodium chloride 0.9 % 100 mL IVPB  2 g Intravenous Q8H Pyreddy, Pavan, MD 200 mL/hr at 07/24/18 0503 2 g at 07/24/18 0503  . guaiFENesin-dextromethorphan (ROBITUSSIN DM) 100-10 MG/5ML syrup 5 mL  5 mL Oral Q4H PRN Saundra Shelling, MD   5 mL at 07/24/18 0317  . LORazepam (ATIVAN) tablet 0.5 mg  0.5 mg Oral Q6H PRN Gladstone Lighter, MD   0.5 mg at 07/24/18 1245  . nicotine (NICODERM CQ - dosed in mg/24 hours) patch 21 mg  21 mg Transdermal Daily Saundra Shelling, MD   21 mg at 07/24/18 3094  . ondansetron (ZOFRAN) tablet 4 mg  4 mg Oral Q6H PRN Pyreddy, Reatha Harps, MD       Or  . ondansetron (ZOFRAN) injection 4 mg  4 mg Intravenous Q6H PRN Pyreddy, Pavan, MD      . pantoprazole (PROTONIX) EC tablet 40 mg  40 mg Oral Daily Gladstone Lighter, MD   40 mg at 07/24/18 0768  . senna-docusate (Senokot-S) tablet 1 tablet  1 tablet Oral QHS PRN Saundra Shelling, MD         Abtx:  Anti-infectives (From admission, onward)   Start     Dose/Rate Route Frequency Ordered Stop   07/24/18 0700  vancomycin (VANCOCIN) 1,500 mg in sodium chloride 0.9 % 500 mL IVPB  Status:  Discontinued     1,500 mg 250 mL/hr over 120 Minutes Intravenous Every 12 hours 07/23/18 2023 07/24/18 0800   07/23/18 2300  ceFEPIme (MAXIPIME) 2 g in sodium chloride 0.9 % 100 mL IVPB     2 g 200 mL/hr over 30 Minutes  Intravenous Every 8 hours 07/23/18 2025     07/23/18 1815  vancomycin (VANCOCIN) 2,000 mg in sodium chloride 0.9 % 500 mL IVPB     2,000 mg 250 mL/hr over 120 Minutes Intravenous  Once 07/23/18 1810 07/23/18 2106   07/23/18 1815  piperacillin-tazobactam (ZOSYN) IVPB 3.375 g     3.375 g 100 mL/hr over 30 Minutes Intravenous  Once 07/23/18 1810 07/23/18 2020      REVIEW OF SYSTEMS:  Const:  fever, negative chills, negative weight loss Eyes: negative diplopia or visual changes, negative eye pain ENT: negative coryza, negative sore throat Resp: cough, hemoptysis, dyspnea Cards: + chest pain, no palpitations, lower extremity edema GU: negative for frequency, dysuria and hematuria GI: Negative for abdominal pain, diarrhea, bleeding, constipation Skin: negative for rash and pruritus Heme: negative for easy bruising and gum/nose bleeding MS: negative for myalgias, arthralgias, back pain and muscle weakness Neurolo:negative for headaches, dizziness, vertigo, memory problems  Psych: negative for feelings of anxiety, depression  Endocrine: no polyuria or polydipsia Allergy/Immunology- negative for any medication or food allergies ? Pertinent Positives include : Objective:  VITALS:  BP 132/86 (BP Location: Left Arm)   Pulse 94   Temp 99.5 F (37.5 C) (Oral)   Resp (!) 21   Ht _0  (1.778 m)   Wt 85.7 kg   SpO2 98%   BMI 27.12 kg/m   Pt not examined  Pertinent Labs Lab Results CBC    Component Value Date/Time   WBC 13.3 (H) 07/24/2018 0326   RBC 3.98 (L) 07/24/2018 0326   HGB 13.1 07/24/2018 1009   HCT 39.3 07/24/2018 1009   PLT 299 07/24/2018 0326   MCV 105.5 (H) 07/24/2018 0326   MCH 34.9 (H) 07/24/2018 0326   MCHC 33.1 07/24/2018 0326   RDW 12.9 07/24/2018 0326   LYMPHSABS 2.5 07/23/2018 1826   MONOABS 1.7 (H) 07/23/2018 1826   EOSABS 0.1 07/23/2018 1826   BASOSABS 0.1 07/23/2018 1826    CMP Latest Ref Rng & Units 07/24/2018 07/23/2018 05/22/2018  Glucose 70 - 99 mg/dL  100(H) 130(H) 101(H)  BUN 6 - 20 mg/dL _1 Creatinine 0.61 - 1.24 mg/dL 0.52(L) 0.45(L) 0.61  Sodium 135 - 145 mmol/L 139 139 145  Potassium 3.5 - 5.1 mmol/L 4.1 3.3(L) 4.0  Chloride 98 - 111 mmol/L 103 101 105  CO2 22 - 32 mmol/L _2 Calcium 8.9 - 10.3 mg/dL 8.8(L) 9.2 10.4  Total Protein 6.5 - 8.1 g/dL - 6.9 7.1  Total Bilirubin 0.3 - 1.2 mg/dL - 0.7 0.6  Alkaline Phos 38 - 126 U/L - 165(H) 104  AST 15 - 41 U/L - 71(H) 56(H)  ALT 0 - 44 U/L - 57(H) 35      Microbiology: Recent Results (from the past 240 hour(s))  Fungus Culture With Stain     Status: None (Preliminary result)   Collection Time: 07/16/18  3:11 PM  Result Value Ref Range Status   Fungus Stain Final report  Final    Comment: (NOTE) Performed At: [facility] [place], [place] [id number] Rush Farmer MD [id number]    Fungus (Mycology) Culture PENDING  Incomplete   Fungal Source FLUID  Final    Comment: Performed at [facility], [address] [place], [place] [address]  Acid Fast Smear (AFB)     Status: None   Collection Time: 07/16/18  3:11 PM  Result Value Ref Range Status   AFB Specimen Processing  Concentration  Final   Acid Fast Smear Negative  Final    Comment: (NOTE) Performed At: [facility] [place], [place] [id number] Rush Farmer MD [id number]    Source (AFB) FLUID  Final    Comment: Performed at [facility], [address] [place], [place] [address]  Fungus Culture Result     Status: None   Collection Time: 07/16/18  3:11 PM  Result Value Ref Range Status   Result 1 Comment  Final    Comment: (NOTE) KOH/Calcofluor preparation:  no fungus observed. Performed At: [facility] [place], [place] [id number] Rush Farmer MD [id number]   Blood Culture (routine x 2)     Status: None (Preliminary result)   Collection Time: 07/23/18  6:27 PM  Result Value Ref Range Status    Specimen Description BLOOD LEFT ANTECUBITAL  Final   Special Requests   Final    BOTTLES DRAWN AEROBIC AND ANAEROBIC Blood Culture adequate volume   Culture   Final    NO GROWTH < 12 HOURS Performed at [facility], [address], [place], [place] [address]    Report Status PENDING  Incomplete  Blood Culture (routine x 2)     Status: None (Preliminary result)   Collection Time: 07/23/18  6:27 PM  Result Value Ref Range Status   Specimen Description BLOOD BLOOD LEFT FOREARM  Final   Special Requests   Final    BOTTLES DRAWN AEROBIC AND ANAEROBIC Blood Culture results may not be optimal due to an excessive volume of blood received in culture bottles   Culture   Final    NO GROWTH < 12 HOURS Performed at [facility], [address], [place], [place] [address]    Report Status PENDING  Incomplete  MRSA PCR Screening     Status: None   Collection Time: 07/23/18 11:25 PM  Result Value Ref Range Status   MRSA by PCR NEGATIVE NEGATIVE Final    Comment:        The GeneXpert MRSA Assay (FDA approved for NASAL specimens only), is one component of a comprehensive MRSA colonization surveillance program. It is not intended to diagnose MRSA infection nor to guide or monitor treatment for MRSA infections. Performed at [facility], [address] [place], [place] [address]     IMAGING RESULTS:  I have personally reviewed the films ? Impression/Recommendation ? 53 yr male smoker, alcohol use presenting with SOB,r t sided chest paina dn cough, and hemoptysis   Rt sided pneumonia with syn-pneumonic effusion- recent pleurocentesis showed an exudate but unfortunately no bacterial cultures sent Afb neg/fungus neg so far Currently on vanco/cefepime- add azithromycin and Dc vanco Need to rule out other non infectious causes including malignancy Will need CT abdomen as CT chest showed some changes in upper abdomen Being tested for COVID 19. SARS COV2 does not  usually cause pleural effusion ? HIV test ordered  ETOH use  H/o pancreatitis   ? ___________________________________________________ Discussed with patient, requesting provider Note:  This document was prepared using Dragon voice recognition software and may include unintentional dictation errors.

## 2018-07-24 NOTE — Telephone Encounter (Signed)
Pt went to ED

## 2018-07-25 ENCOUNTER — Inpatient Hospital Stay: Payer: BLUE CROSS/BLUE SHIELD

## 2018-07-25 DIAGNOSIS — Z720 Tobacco use: Secondary | ICD-10-CM

## 2018-07-25 DIAGNOSIS — K863 Pseudocyst of pancreas: Secondary | ICD-10-CM

## 2018-07-25 DIAGNOSIS — J180 Bronchopneumonia, unspecified organism: Secondary | ICD-10-CM

## 2018-07-25 LAB — LACTATE DEHYDROGENASE, PLEURAL OR PERITONEAL FLUID: LD, Fluid: 212 U/L — ABNORMAL HIGH (ref 3–23)

## 2018-07-25 LAB — CBC
HCT: 38.1 % — ABNORMAL LOW (ref 39.0–52.0)
Hemoglobin: 12.8 g/dL — ABNORMAL LOW (ref 13.0–17.0)
MCH: 35.4 pg — ABNORMAL HIGH (ref 26.0–34.0)
MCHC: 33.6 g/dL (ref 30.0–36.0)
MCV: 105.2 fL — ABNORMAL HIGH (ref 80.0–100.0)
Platelets: 214 10*3/uL (ref 150–400)
RBC: 3.62 MIL/uL — ABNORMAL LOW (ref 4.22–5.81)
RDW: 12.5 % (ref 11.5–15.5)
WBC: 15.1 10*3/uL — ABNORMAL HIGH (ref 4.0–10.5)
nRBC: 0 % (ref 0.0–0.2)

## 2018-07-25 LAB — RESPIRATORY PANEL BY PCR

## 2018-07-25 LAB — BASIC METABOLIC PANEL
Anion gap: 13 (ref 5–15)
BUN: 9 mg/dL (ref 6–20)
CO2: 23 mmol/L (ref 22–32)
Calcium: 8.8 mg/dL — ABNORMAL LOW (ref 8.9–10.3)
Chloride: 98 mmol/L (ref 98–111)
Creatinine, Ser: 0.54 mg/dL — ABNORMAL LOW (ref 0.61–1.24)
GFR calc Af Amer: >60 mL/min (ref >60)
GFR calc non Af Amer: >60 mL/min (ref >60)
Glucose, Bld: 92 mg/dL (ref 70–99)
Potassium: 3.4 mmol/L — ABNORMAL LOW (ref 3.5–5.1)
Sodium: 134 mmol/L — ABNORMAL LOW (ref 135–145)

## 2018-07-25 LAB — GLUCOSE, CAPILLARY
Glucose-Capillary: 82 mg/dL (ref 70–99)
Glucose-Capillary: 100 mg/dL — ABNORMAL HIGH (ref 70–99)
Glucose-Capillary: 101 mg/dL — ABNORMAL HIGH (ref 70–99)
Glucose-Capillary: 89 mg/dL (ref 70–99)

## 2018-07-25 LAB — BODY FLUID CELL COUNT WITH DIFFERENTIAL
Eos, Fluid: 0 %
Lymphs, Fluid: 10 %
Monocyte-Macrophage-Serous Fluid: 16 %
Neutrophil Count, Fluid: 74 %
Other Cells, Fluid: 0 %
Total Nucleated Cell Count, Fluid: 2541 cu mm

## 2018-07-25 LAB — GLUCOSE, PLEURAL OR PERITONEAL FLUID: Glucose, Fluid: 110 mg/dL

## 2018-07-25 LAB — HIV ANTIBODY (ROUTINE TESTING W REFLEX): HIV Screen 4th Generation wRfx: NONREACTIVE

## 2018-07-25 LAB — AMYLASE, PLEURAL OR PERITONEAL FLUID??????? ??????: Amylase, Fluid: 723 U/L

## 2018-07-25 LAB — AMYLASE, PLEURAL OR PERITONEAL FLUID

## 2018-07-25 LAB — LEGIONELLA PNEUMOPHILA SEROGP 1 UR AG: L. pneumophila Serogp 1 Ur Ag: NEGATIVE

## 2018-07-25 MED ORDER — PRAVASTATIN SODIUM 20 MG PO TABS
20.0000 mg | ORAL_TABLET | Freq: Every day | ORAL | Status: DC
  Administered 2018-07-25 – 2018-07-26 (×2): 20 mg via ORAL
  Filled 2018-07-25 (×2): qty 1

## 2018-07-25 MED ORDER — PIPERACILLIN-TAZOBACTAM 3.375 G IVPB
3.3750 g | Freq: Three times a day (TID) | INTRAVENOUS | Status: DC
  Administered 2018-07-25 – 2018-07-27 (×6): 3.375 g via INTRAVENOUS
  Filled 2018-07-25 (×6): qty 50

## 2018-07-25 MED ORDER — SERTRALINE HCL 50 MG PO TABS
75.0000 mg | ORAL_TABLET | Freq: Every morning | ORAL | Status: DC
  Administered 2018-07-26 – 2018-07-27 (×2): 75 mg via ORAL
  Filled 2018-07-25 (×2): qty 2

## 2018-07-25 NOTE — Progress Notes (Signed)
[facility] [place] NAME: Darrell Santiago    MR#:  [id number]  DATE OF BIRTH:  25-Sep-1965  SUBJECTIVE:  CHIEF COMPLAINT:   Chief Complaint  Patient presents with   Shortness of Breath   -Recent right-sided thoracentesis as outpatient 1 week ago for pleural effusion.  Comes in with worsening shortness of breath. -Off oxygen today.  COVID-19 test is still pending -Abdominal CT showing complex pseudocyst  REVIEW OF SYSTEMS:  Review of Systems  Constitutional: Positive for fever and malaise/fatigue. Negative for chills.  HENT: Negative for congestion, ear discharge, hearing loss and nosebleeds.   Eyes: Negative for blurred vision and double vision.  Respiratory: Positive for cough, shortness of breath and wheezing.   Cardiovascular: Negative for chest pain, palpitations and leg swelling.  Gastrointestinal: Positive for nausea. Negative for abdominal pain, constipation, diarrhea and vomiting.  Genitourinary: Negative for dysuria.  Musculoskeletal: Positive for myalgias.  Neurological: Negative for dizziness, focal weakness, seizures, weakness and headaches.  Psychiatric/Behavioral: Negative for depression.    DRUG ALLERGIES:  No Known Allergies  VITALS:  Blood pressure 112/76, pulse (!) 113, temperature 99.6 F (37.6 C), temperature source Oral, resp. rate 16, height 5\' 10"  (1.778 m), weight 85.1 kg, SpO2 98 %.  PHYSICAL EXAMINATION:  Physical Exam   GENERAL:  53 y.o.-year-old patient lying in the bed with no acute distress.  EYES: Pupils equal, round, reactive to light and accommodation. No scleral icterus. Extraocular muscles intact.  HEENT: Head atraumatic, normocephalic. Oropharynx and nasopharynx clear.  Flushed face noted NECK:  Supple, no jugular venous distention. No thyroid enlargement, no tenderness.  LUNGS:no wheezing, rales,rhonchi or crepitation. No use of accessory muscles of respiration.  Significantly decreased  right-sided breath sounds. CARDIOVASCULAR: S1, S2 normal. No murmurs, rubs, or gallops.  ABDOMEN: Soft, tense, nontender,, nondistended. Bowel sounds present. No organomegaly or mass.  EXTREMITIES: No pedal edema, cyanosis, or clubbing.  NEUROLOGIC: Cranial nerves II through XII are intact. Muscle strength 5/5 in all extremities. Sensation intact. Gait not checked.  PSYCHIATRIC: The patient is alert and oriented x 3.  SKIN: No obvious rash, lesion, or ulcer.    LABORATORY PANEL:   CBC Recent Labs  Lab 07/25/18 0433  WBC 15.1*  HGB 12.8*  HCT 38.1*  PLT 214   ------------------------------------------------------------------------------------------------------------------  Chemistries  Recent Labs  Lab 07/23/18 1826  07/25/18 0433  NA 139   < > 134*  K 3.3*   < > 3.4*  CL 101   < > 98  CO2 25   < > 23  GLUCOSE 130*   < > 92  BUN 9   < > 9  CREATININE 0.45*   < > 0.54*  CALCIUM 9.2   < > 8.8*  AST 71*  --   --   ALT 57*  --   --   ALKPHOS 165*  --   --   BILITOT 0.7  --   --    < > = values in this interval not displayed.   ------------------------------------------------------------------------------------------------------------------  Cardiac Enzymes Recent Labs  Lab 07/23/18 1826  TROPONINI <0.03   ------------------------------------------------------------------------------------------------------------------  RADIOLOGY:  Ct Chest W Contrast  Result Date: 07/23/2018 CLINICAL DATA:  Shortness of breath, cough and fever over the last week. Abnormal chest radiography with right pneumonia. EXAM: CT CHEST WITH CONTRAST TECHNIQUE: Multidetector CT imaging of the chest was performed during intravenous contrast administration. CONTRAST:  45mL OMNIPAQUE IOHEXOL 300 MG/ML  SOLN COMPARISON:  Chest radiography  same day and 07/18/2018 FINDINGS: Cardiovascular: Heart size is normal. There is mild coronary artery calcification. There is mild aortic atherosclerosis.  Mediastinum/Nodes: No mediastinal or hilar mass or lymphadenopathy. Lungs/Pleura: Left chest does not show any pleural effusion. There are some mild hazy areas of airspace filling in the left lower lobe consistent with early bronchopneumonia. On the right, there is a pleural effusion layering dependently, moderate in size. Thickness is on average 3 cm. There is airspace filling in the right lower lobe and right middle lobe consistent with bronchopneumonia. Upper lobe is largely spared. Upper Abdomen: There is a markedly abnormal appearance of low-density disease in the retrocrural region, diaphragmatic hiatus and upper abdominal retroperitoneum with multiple low-density lesions most consistent with necrotic lymphadenopathy. Processes adjacent to the pancreas but I do not definitely identify a pancreatic origin. I would suggest complete evaluation of the abdomen with CT initially. MRI may be useful at some point. Also incidental is noted to be a 1 cm left adrenal. Musculoskeletal: Negative IMPRESSION: Right effusion layering dependently, moderate in size. Airspace filling process in the right lower lobe and right middle lobe most consistent with bronchopneumonia. This is nonspecific and could be bacterial or viral. Early airspace filling, much less advanced, in the left lower lobe. Markedly abnormal process in the lower mediastinum and retrocrural region and quite extensive within the upper abdominal retroperitoneum, with areas of low-density with indistinct margins. Findings are presumed represent necrotic adenopathy/tumor. Atypical infection could conceivably have this appearance. The etiology is indeterminate at this time, and I would recommend complete abdominal CT initially. It is possible left MRI might be useful at some point. Electronically Signed   By: Nelson Chimes M.D.   On: 07/23/2018 21:21   Ct Abdomen Pelvis W Contrast  Result Date: 07/24/2018 CLINICAL DATA:  Abnormal retrocrural lymphadenopathy on  chest CT. EXAM: CT ABDOMEN AND PELVIS WITH CONTRAST TECHNIQUE: Multidetector CT imaging of the abdomen and pelvis was performed using the standard protocol following bolus administration of intravenous contrast. CONTRAST:  14mL OMNIPAQUE IOHEXOL 300 MG/ML  SOLN COMPARISON:  Chest CT 07/23/2018.  Abdomen/pelvis CT 03/01/2018. FINDINGS: Lower chest: Small right pleural effusion noted. There is airspace consolidation in the medial/paraspinal right lower lobe. Rim enhancing fluid collection dissects up from the abdomen through the esophageal hiatus and along the medial inferior right pleural space. (See below) Hepatobiliary: No suspicious focal abnormality within the liver parenchyma. Gallbladder surgically absent. No intrahepatic or extrahepatic biliary dilation. Pancreas: Pancreatic parenchyma is diffusely atrophic with parenchymal calcification in the pancreatic head suggesting chronic pancreatitis. 4 mm stone in the body of pancreas may reside in the main pancreatic duct. The relatively diffuse peripancreatic edema/inflammation seen on the study from 03/01/2018 has decreased. However, the patient now has multiple rim enhancing cystic collections in the retroperitoneal space including a 2.6 x 3.2 cm rim enhancing cyst seen on 26/2. Immediately adjacent is a 3.8 x 2.7 cm rim enhancing cyst just cranial to the common hepatic artery and portal vein. And rim enhancing fluid is seen tracking cranially up along the esophagogastric junction through the diaphragmatic hiatus and into the medial aspect of the lower right hemithorax. Spleen: No splenomegaly. No focal mass lesion. Adrenals/Urinary Tract: Right adrenal gland unremarkable. Stable appearance of a 13 mm left adrenal nodule, likely adenoma or myelolipoma. Right kidney unremarkable. Stable appearance 10 mm low-density lesion interpolar left kidney, likely a cyst. No evidence for hydroureter. The urinary bladder appears normal for the degree of distention.  Stomach/Bowel: Stomach is unremarkable. No gastric  wall thickening. No evidence of outlet obstruction. Duodenum is normally positioned as is the ligament of Treitz. No small bowel wall thickening. No small bowel dilatation. The terminal ileum is normal. The appendix is normal. No gross colonic mass. No colonic wall thickening. Diverticular changes are noted in the left colon without evidence of diverticulitis. Vascular/Lymphatic: There is abdominal aortic atherosclerosis without aneurysm. Mild lymphadenopathy is noted in the gastrohepatic and hepato duodenal ligaments. Small retroperitoneal lymph nodes noted. No pelvic sidewall lymphadenopathy. Reproductive: The prostate gland and seminal vesicles are unremarkable. Other: No intraperitoneal free fluid. Musculoskeletal: No worrisome lytic or sclerotic osseous abnormality. IMPRESSION: 1. Complex multiloculated rim enhancing cystic process tracking from the head and body of pancreas up along the gastroesophageal junction, through the diaphragmatic hiatus and into the medial right lower hemithorax. Given the history of pancreatitis seen on CT of 03/01/2018, features today are most suggestive of a complex pseudocyst dissecting up through the diaphragmatic hiatus into the medial aspect of the inferior right hemithorax/pleural space. There is an associated small right pleural effusion and adjacent medial right lower lobe airspace consolidation, likely secondary to the pseudocyst. Superinfection of the pseudocyst cannot be excluded by CT imaging. 2. Mild hepato duodenal ligament lymphadenopathy, likely reactive. Consider follow-up in 3 months to ensure stability/resolution. 3. Diffuse atrophy of pancreatic parenchyma with parenchymal calcification evident, features compatible with chronic pancreatitis. 4 mm stone in the body of pancreas may reside within the mildly prominent main pancreatic duct. 4. Stable 13 mm left adrenal nodule compatible with benign adrenal adenoma or  myelolipoma. Electronically Signed   By: Misty Stanley M.D.   On: 07/24/2018 20:59   Dg Chest Port 1 View  Result Date: 07/25/2018 CLINICAL DATA:  Post thoracentesis EXAM: PORTABLE CHEST 1 VIEW COMPARISON:  07/23/2018 FINDINGS: Right pleural effusion and right basilar pneumonia have improved. Left lung remains clear. Upper normal heart size. No pneumothorax. Unremarkable vascularity. IMPRESSION: No pneumothorax post right thoracentesis. Electronically Signed   By: Marybelle Killings M.D.   On: 07/25/2018 11:28   Dg Chest Portable 1 View  Result Date: 07/23/2018 CLINICAL DATA:  Cough and short of breath. Current treatment for pneumonia. EXAM: PORTABLE CHEST 1 VIEW COMPARISON:  07/18/2018 FINDINGS: Interval progression of infiltrate in the right lung base with small right effusion. Left lung remains clear. Cardiac enlargement without heart failure. IMPRESSION: Interval progression of right lower lobe infiltrate compatible with pneumonia. Small right pleural effusion. Electronically Signed   By: Franchot Gallo M.D.   On: 07/23/2018 18:25   US Thoracentesis Asp Pleural Space W/img Guide  Result Date: 07/25/2018 INDICATION: Right pleural effusion EXAM: ULTRASOUND GUIDED RIGHT THORACENTESIS MEDICATIONS: None. COMPLICATIONS: None immediate. PROCEDURE: An ultrasound guided thoracentesis was thoroughly discussed with the patient and questions answered. The benefits, risks, alternatives and complications were also discussed. The patient understands and wishes to proceed with the procedure. Written consent was obtained. Ultrasound was performed to localize and mark an adequate pocket of fluid in the right chest. The area was then prepped and draped in the normal sterile fashion. 1% Lidocaine was used for local anesthesia. Under ultrasound guidance a 6 Fr Safe-T-Centesis catheter was introduced. Thoracentesis was performed. The catheter was removed and a dressing applied. FINDINGS: A total of approximately 700 cc of clear  yellow fluid was removed. IMPRESSION: Successful ultrasound guided right thoracentesis yielding 700 cc of pleural fluid. Electronically Signed   By: Marybelle Killings M.D.   On: 07/25/2018 11:15    EKG:   Orders placed or performed during  the hospital encounter of 07/23/18   ED EKG 12-Lead   ED EKG 12-Lead    ASSESSMENT AND PLAN:   53 year old male with past medical history significant for allergies, undiagnosed COPD/asthma not on home oxygen, GERD, hypertension presents to hospital secondary to worsening shortness of breath.  1.  Right-sided bronchopneumonia-patient had pleural effusion on the right side last week status post thoracentesis and 1.2 L removed.  But pleural fluid was not sent in for bacterial cultures. -Still has moderate pleural effusion on the right side.  Status post thoracentesis today on 07/25/2018 and sent for cultures. - CT chest on admission showing significant right middle and lower lobe bronchopneumonia -Appreciate pulmonary consult.  Also ID has been consulted -Currently on cefepime.  Azithromycin added for atypical coverage.  MRSA PCR is negative, -Currently off oxygen. -Given his symptoms, COVID-19 test is pending at this time  2.  Hemoptysis-no hematemesis identified.  Hemoglobin is stable. -Hemoptysis secondary to bronchopneumonia.  Monitor.  Improving now.  3.  CT of the abdomen was ordered secondary to some abnormalities noted on CT chest.  CT abdomen showing complex pancreatic pseudocyst.  Patient has history of alcohol use. -General surgery has been consulted for the same.  4.  Tobacco use disorder-likely has undiagnosed COPD.   on inhalers as outpatient.  Continue to follow-up with pulmonary as outpatient for PFTs. -On nicotine patch  5.  DVT prophylaxis-teds and SCDs only, given hemoptysis.   Discussed with his wife over the phone. Physical therapy when stable   All the records are reviewed and case discussed with Care Management/Social  Workerr. Management plans discussed with the patient, family and they are in agreement.  CODE STATUS: Full code  TOTAL TIME TAKING CARE OF THIS PATIENT: 41 minutes.   POSSIBLE D/C IN 2-3 DAYS, DEPENDING ON CLINICAL CONDITION.   Gladstone Lighter M.D on 07/25/2018 at 1:14 PM  Between 7am to 6pm - Pager - [phone]  After 6pm go to [email or web address] - password [email or web address] [email or web address]  Office  [phone]  CC: Primary care physician; McLean-Scocuzza, Nino Glow, MD

## 2018-07-25 NOTE — Progress Notes (Signed)
Patient was treated for fever at 1800.

## 2018-07-25 NOTE — Consult Note (Signed)
Date of Consultation:  07/25/2018  Requesting Physician:  Gladstone Lighter, MD  Reason for Consultation:  Complex pancreatic pseudocyst  History of Present Illness: Darrell Santiago is a 53 y.o. male admitted on 4/6 with shortness of breath.  He was seen as outpatient by Dr. Raul Del on 3/30 with shortness of breath and noted to have a right sided pleural effusion.  He underwent right U/S guided thoracentesis for 1200 ml and fluid analysis shows it as an exudative fluid.  He has history of EtOH abuse and there was concern for possible aspiration pneumonia.  Was started on Augmentin.  He presented to the ED on 4/1 but left AMA.  Follow up with Dr. Raul Del again on 4/2.  Suspicion for COPD as he also smokes, started on Duoneb.  However, he had a fever to 100.4 on 4/6 and presented to ED again.  He had CT chest which showed recurrent right pleural effusion and bronchopneumonia of right middle and lower lobes, as well as significant retroperitoneal and posterior mediastinal lymphadenopathy.  This was followed up with CT abdomen and pelvis yesterday and had a repeat right thoracentesis today for 700 ml.  CT has been independently viewed by me and I agree with the findings.  He has a complex pancreatic pseudocyst originating at the head/neck of the pancreas extending superiorly through the diaphragm at the esophageal hiatus, going into the right lower chest.  Pseudocyst infection cannot be excluded, but the patient's WBC is elevated at 16.1 on admission and 15.1 today.    Currently, patient reports his breathing is better after thorcentesis this morning.  He denies having any nausea or vomiting or abdominal pain.  He denies any consistent diarrhea or weight loss.  He reports he drinks a few shots per night, but he has stopped at times and not had issues with DT's or withdrawals.  Surgery was consulted for evaluation of the complex pancreatic pseudocyst.  Past Medical History: Past Medical History:  Diagnosis  Date  . Abnormal EKG   . Allergy    Seasonal  . Anxiety   . Chicken pox   . Depression   . GERD (gastroesophageal reflux disease)   . Hyperlipidemia   . Hypertension   . Urine incontinence      Past Surgical History: Past Surgical History:  Procedure Laterality Date  . CHOLECYSTECTOMY  2000  . LEFT HEART CATH AND CORONARY ANGIOGRAPHY N/A 01/31/2017   Procedure: LEFT HEART CATH AND CORONARY ANGIOGRAPHY;  Surgeon: Dionisio David, MD;  Location: [facility];  Service: Cardiovascular;  Laterality: N/A;    Home Medications: Prior to Admission medications   Medication Sig Start Date End Date Taking? Authorizing Provider  aspirin 81 MG chewable tablet Chew 81 mg by mouth daily.    Yes [provider]  budesonide-formoterol (SYMBICORT) 160-4.5 MCG/ACT inhaler Inhale 2 puffs into the lungs 2 (two) times daily. Rinse mouth after use 01/19/18  Yes McLean-Scocuzza, Nino Glow, MD  ipratropium-albuterol (DUONEB) 0.5-2.5 (3) MG/3ML SOLN Inhale 3 mLs into the lungs 4 (four) times daily. 07/19/18 07/14/19 Yes [provider]  LORazepam (ATIVAN) 0.5 MG tablet Take 1 tablet (0.5 mg total) by mouth daily as needed for anxiety. 01/19/18  Yes McLean-Scocuzza, Nino Glow, MD  losartan (COZAAR) 50 MG tablet Take 1 tablet (50 mg total) by mouth daily. 05/24/18  Yes McLean-Scocuzza, Nino Glow, MD  Multiple Vitamin (MULTIVITAMIN WITH MINERALS) TABS tablet Take 1 tablet by mouth daily.   Yes [provider]  OVER  THE COUNTER MEDICATION Take 1 capsule by mouth daily.   Yes [provider]  pantoprazole (PROTONIX) 20 MG tablet Take 1 tablet (20 mg total) by mouth daily. 30 minutes before food 09/19/17  Yes McLean-Scocuzza, Nino Glow, MD  sertraline (ZOLOFT) 50 MG tablet Take 1.5 tablets (75 mg total) by mouth daily. In am 01/19/18  Yes McLean-Scocuzza, Nino Glow, MD  vitamin B-12 (CYANOCOBALAMIN) 1000 MCG tablet Take 1,000 mcg by mouth daily.    Yes [provider]  Naproxen  Sodium (ALEVE) 220 MG CAPS Take 1 capsule by mouth daily as needed (workplace pain).     [provider]  pravastatin (PRAVACHOL) 20 MG tablet Take 1 tablet (20 mg total) by mouth at bedtime. 05/25/18   McLean-Scocuzza, Nino Glow, MD  predniSONE (STERAPRED UNI-PAK 21 TAB) 10 MG (21) TBPK tablet As directed Patient not taking: Reported on 07/24/2018 07/15/18   Dutch Quint B, FNP    Allergies: No Known Allergies  Social History:  reports that he has been smoking cigarettes. He has been smoking about 1.00 pack per day. He has never used smokeless tobacco. He reports current alcohol use of about 21.0 standard drinks of alcohol per week. He reports that he does not use drugs.   Family History: Family History  Problem Relation Age of Onset  . Alcohol abuse Father   . Diabetes Father   . Cancer Father        ?stage IV thyroid with met  . Cancer Maternal Grandfather        Colon Cancer    Review of Systems: Review of Systems  Constitutional: Positive for fever (low grade temp). Negative for chills and weight loss.  HENT: Negative for hearing loss.   Respiratory: Positive for shortness of breath.   Cardiovascular: Negative for chest pain.  Gastrointestinal: Negative for abdominal pain, constipation, diarrhea, nausea and vomiting.  Genitourinary: Negative for dysuria.  Musculoskeletal: Negative for myalgias.  Skin: Negative for rash.  Neurological: Negative for dizziness.  Psychiatric/Behavioral: Negative for depression.    Physical Exam BP 112/76 (BP Location: Right Arm)   Pulse (!) 113   Temp 99.6 F (37.6 C) (Oral)   Resp 16   Ht '5\' 10"'$  (1.778 m)   Wt 85.1 kg   SpO2 98%   BMI 26.93 kg/m  CONSTITUTIONAL: No acute distress HEENT:  Normocephalic, atraumatic, extraocular motion intact. NECK: Trachea is midline, and there is no jugular venous distension. RESPIRATORY:  Normal respiratory effort without pathologic use of accessory muscles. CARDIOVASCULAR: Heart is regular  without murmurs, gallops, or rubs. GI: The abdomen is soft, obese, non-distended, non-tender to palpation. There were no palpable masses. MUSCULOSKELETAL:  Normal muscle strength and tone in all four extremities.  No peripheral edema or cyanosis. SKIN: Skin turgor is normal. There are no pathologic skin lesions.  NEUROLOGIC:  Motor and sensation is grossly normal.  Cranial nerves are grossly intact. PSYCH:  Alert and oriented to person, place and time. Affect is normal.  Laboratory Analysis: Results for orders placed or performed during the hospital encounter of 07/23/18 (from the past 24 hour(s))  Hemoglobin and hematocrit, blood     Status: None   Collection Time: 07/24/18  3:55 PM  Result Value Ref Range   Hemoglobin 13.5 13.0 - 17.0 g/dL   HCT 40.7 39.0 - 52.0 %  Lactate dehydrogenase     Status: None   Collection Time: 07/24/18  3:55 PM  Result Value Ref Range   LDH 137 98 - 192  U/L  C-reactive protein     Status: Abnormal   Collection Time: 07/24/18  3:55 PM  Result Value Ref Range   CRP 11.4 (H) <1.0 mg/dL  Procalcitonin - Baseline     Status: None   Collection Time: 07/24/18  3:55 PM  Result Value Ref Range   Procalcitonin <0.10 ng/mL  Strep pneumoniae urinary antigen     Status: None   Collection Time: 07/24/18  4:23 PM  Result Value Ref Range   Strep Pneumo Urinary Antigen NEGATIVE NEGATIVE  Legionella Pneumophila Serogp 1 Ur Ag     Status: None   Collection Time: 07/24/18  4:23 PM  Result Value Ref Range   L. pneumophila Serogp 1 Ur Ag Negative Negative   Source of Sample URINE, RANDOM   CBC     Status: Abnormal   Collection Time: 07/25/18  4:33 AM  Result Value Ref Range   WBC 15.1 (H) 4.0 - 10.5 K/uL   RBC 3.62 (L) 4.22 - 5.81 MIL/uL   Hemoglobin 12.8 (L) 13.0 - 17.0 g/dL   HCT 38.1 (L) 39.0 - 52.0 %   MCV 105.2 (H) 80.0 - 100.0 fL   MCH 35.4 (H) 26.0 - 34.0 pg   MCHC 33.6 30.0 - 36.0 g/dL   RDW 12.5 11.5 - 15.5 %   Platelets 214 150 - 400 K/uL   nRBC 0.0  0.0 - 0.2 %  Basic metabolic panel     Status: Abnormal   Collection Time: 07/25/18  4:33 AM  Result Value Ref Range   Sodium 134 (L) 135 - 145 mmol/L   Potassium 3.4 (L) 3.5 - 5.1 mmol/L   Chloride 98 98 - 111 mmol/L   CO2 23 22 - 32 mmol/L   Glucose, Bld 92 70 - 99 mg/dL   BUN 9 6 - 20 mg/dL   Creatinine, Ser 0.54 (L) 0.61 - 1.24 mg/dL   Calcium 8.8 (L) 8.9 - 10.3 mg/dL   GFR calc non Af Amer >60 >60 mL/min   GFR calc Af Amer >60 >60 mL/min   Anion gap 13 5 - 15  Glucose, capillary     Status: None   Collection Time: 07/25/18  8:26 AM  Result Value Ref Range   Glucose-Capillary 82 70 - 99 mg/dL   Comment 1 Notify RN   Lactate dehydrogenase (pleural or peritoneal fluid)     Status: Abnormal   Collection Time: 07/25/18 10:45 AM  Result Value Ref Range   LD, Fluid 212 (H) 3 - 23 U/L   Fluid Type-FLDH PLEURAL   Body fluid cell count with differential     Status: Abnormal   Collection Time: 07/25/18 10:45 AM  Result Value Ref Range   Fluid Type-FCT PLEURAL    Color, Fluid YELLOW YELLOW   Appearance, Fluid HAZY (A) CLEAR   WBC, Fluid 2,541 cu mm   Neutrophil Count, Fluid 74 %   Lymphs, Fluid 10 %   Monocyte-Macrophage-Serous Fluid 16 %   Eos, Fluid 0 %   Other Cells, Fluid 0 %  Glucose, pleural or peritoneal fluid     Status: None   Collection Time: 07/25/18 10:45 AM  Result Value Ref Range   Glucose, Fluid 110 mg/dL   Fluid Type-FGLU PLEURAL   Body fluid culture     Status: None (Preliminary result)   Collection Time: 07/25/18 11:11 AM  Result Value Ref Range   Specimen Description      PLEURAL Performed at [facility], [place]  [address], [place], [place] [address]    Special Requests      NONE Performed at [facility], [place], [place] [address]    Gram Stain      RARE WBC PRESENT,BOTH PMN AND MONONUCLEAR NO ORGANISMS SEEN Performed at [facility], [place] [address], [place],  [address]    Culture PENDING    Report  Status PENDING   Amylase, pleural or peritoneal fluid     Status: None   Collection Time: 07/25/18 11:11 AM  Result Value Ref Range   Amylase, Fluid 723 U/L   Fluid Type-FAMY Pleural R   Glucose, capillary     Status: Abnormal   Collection Time: 07/25/18 11:54 AM  Result Value Ref Range   Glucose-Capillary 100 (H) 70 - 99 mg/dL   Comment 1 Notify RN     Imaging: Ct Abdomen Pelvis W Contrast  Result Date: 07/24/2018 CLINICAL DATA:  Abnormal retrocrural lymphadenopathy on chest CT. EXAM: CT ABDOMEN AND PELVIS WITH CONTRAST TECHNIQUE: Multidetector CT imaging of the abdomen and pelvis was performed using the standard protocol following bolus administration of intravenous contrast. CONTRAST:  120m OMNIPAQUE IOHEXOL 300 MG/ML  SOLN COMPARISON:  Chest CT 07/23/2018.  Abdomen/pelvis CT 03/01/2018. FINDINGS: Lower chest: Small right pleural effusion noted. There is airspace consolidation in the medial/paraspinal right lower lobe. Rim enhancing fluid collection dissects up from the abdomen through the esophageal hiatus and along the medial inferior right pleural space. (See below) Hepatobiliary: No suspicious focal abnormality within the liver parenchyma. Gallbladder surgically absent. No intrahepatic or extrahepatic biliary dilation. Pancreas: Pancreatic parenchyma is diffusely atrophic with parenchymal calcification in the pancreatic head suggesting chronic pancreatitis. 4 mm stone in the body of pancreas may reside in the main pancreatic duct. The relatively diffuse peripancreatic edema/inflammation seen on the study from 03/01/2018 has decreased. However, the patient now has multiple rim enhancing cystic collections in the retroperitoneal space including a 2.6 x 3.2 cm rim enhancing cyst seen on 26/2. Immediately adjacent is a 3.8 x 2.7 cm rim enhancing cyst just cranial to the common hepatic artery and portal vein. And rim enhancing fluid is seen tracking cranially up along the esophagogastric  junction through the diaphragmatic hiatus and into the medial aspect of the lower right hemithorax. Spleen: No splenomegaly. No focal mass lesion. Adrenals/Urinary Tract: Right adrenal gland unremarkable. Stable appearance of a 13 mm left adrenal nodule, likely adenoma or myelolipoma. Right kidney unremarkable. Stable appearance 10 mm low-density lesion interpolar left kidney, likely a cyst. No evidence for hydroureter. The urinary bladder appears normal for the degree of distention. Stomach/Bowel: Stomach is unremarkable. No gastric wall thickening. No evidence of outlet obstruction. Duodenum is normally positioned as is the ligament of Treitz. No small bowel wall thickening. No small bowel dilatation. The terminal ileum is normal. The appendix is normal. No gross colonic mass. No colonic wall thickening. Diverticular changes are noted in the left colon without evidence of diverticulitis. Vascular/Lymphatic: There is abdominal aortic atherosclerosis without aneurysm. Mild lymphadenopathy is noted in the gastrohepatic and hepato duodenal ligaments. Small retroperitoneal lymph nodes noted. No pelvic sidewall lymphadenopathy. Reproductive: The prostate gland and seminal vesicles are unremarkable. Other: No intraperitoneal free fluid. Musculoskeletal: No worrisome lytic or sclerotic osseous abnormality. IMPRESSION: 1. Complex multiloculated rim enhancing cystic process tracking from the head and body of pancreas up along the gastroesophageal junction, through the diaphragmatic hiatus and into the medial right lower hemithorax. Given the history of pancreatitis seen on CT of  03/01/2018, features today are most suggestive of a complex pseudocyst dissecting up through the diaphragmatic hiatus into the medial aspect of the inferior right hemithorax/pleural space. There is an associated small right pleural effusion and adjacent medial right lower lobe airspace consolidation, likely secondary to the pseudocyst.  Superinfection of the pseudocyst cannot be excluded by CT imaging. 2. Mild hepato duodenal ligament lymphadenopathy, likely reactive. Consider follow-up in 3 months to ensure stability/resolution. 3. Diffuse atrophy of pancreatic parenchyma with parenchymal calcification evident, features compatible with chronic pancreatitis. 4 mm stone in the body of pancreas may reside within the mildly prominent main pancreatic duct. 4. Stable 13 mm left adrenal nodule compatible with benign adrenal adenoma or myelolipoma. Electronically Signed   By: Misty Stanley M.D.   On: 07/24/2018 20:59   Dg Chest Port 1 View  Result Date: 07/25/2018 CLINICAL DATA:  Post thoracentesis EXAM: PORTABLE CHEST 1 VIEW COMPARISON:  07/23/2018 FINDINGS: Right pleural effusion and right basilar pneumonia have improved. Left lung remains clear. Upper normal heart size. No pneumothorax. Unremarkable vascularity. IMPRESSION: No pneumothorax post right thoracentesis. Electronically Signed   By: Marybelle Killings M.D.   On: 07/25/2018 11:28   US Thoracentesis Asp Pleural Space W/img Guide  Result Date: 07/25/2018 INDICATION: Right pleural effusion EXAM: ULTRASOUND GUIDED RIGHT THORACENTESIS MEDICATIONS: None. COMPLICATIONS: None immediate. PROCEDURE: An ultrasound guided thoracentesis was thoroughly discussed with the patient and questions answered. The benefits, risks, alternatives and complications were also discussed. The patient understands and wishes to proceed with the procedure. Written consent was obtained. Ultrasound was performed to localize and mark an adequate pocket of fluid in the right chest. The area was then prepped and draped in the normal sterile fashion. 1% Lidocaine was used for local anesthesia. Under ultrasound guidance a 6 Fr Safe-T-Centesis catheter was introduced. Thoracentesis was performed. The catheter was removed and a dressing applied. FINDINGS: A total of approximately 700 cc of clear yellow fluid was removed. IMPRESSION:  Successful ultrasound guided right thoracentesis yielding 700 cc of pleural fluid. Electronically Signed   By: Marybelle Killings M.D.   On: 07/25/2018 11:15    Assessment and Plan: This is a 53 y.o. male admitted with shortness of breath, recurrent right pleural effusion, and new findings of complex multiloculated pancreatic pseudocyst extending superiorly into the right chest.  Discussed with the patient that it is my suspicion that his pancreatic pseudocyst is causing the reactive pleural effusion on the right chest.  Unclear if the pseudocyst is infected.  Pleural fluid amylase was only 723, so likely not communicating with the pseudocyst itself.  Pleural fluid WBC elevated but not reflecting empyema and the fluid aspirated was not purulent per IR.  Discussed with Dr. Vicente Males and GI at [facility] does not do cyst-gastrostomies.  Discussed with Dr. Barbie Banner with IR and the pseudocyst is not amenable for percutaneous drainage.  Thus, he requires evaluation at a tertiary center.  The patient would prefer transfer to [facility] if possible, and would require evaluation by GI and surgery for drainage of the pseudocyst.  At this point, he's pending results for COVID-19 testing, which should be back by tomorrow.  It is reasonable to wait until then to try arrange transfer, since if he rules out negative would be one less variable to complicate his transfer.  In the meantime, continue IV antibiotic management, await pleural fluid cultures and further testing.  Patient discussed with Dr. Tressia Miners.  Face-to-face time spent with the patient and care providers was 80 minutes, with more than  50% of the time spent counseling, educating, and coordinating care of the patient.     Melvyn Neth, MD [facility] Pg:  [phone]

## 2018-07-25 NOTE — Consult Note (Signed)
Pulmonary Medicine          Date: 07/25/2018,   MRN# [id number] Darrell Santiago 11-20-1965     AdmissionWeight: 88.5 kg                 CurrentWeight: 85.1 kg        SUBJECTIVE    Patient status post thoracentesis reports clinical improvement with less dyspnea at rest. Status post CT abdomen and pelvis-abdominal pseudocyst  PAST MEDICAL HISTORY   Past Medical History:  Diagnosis Date   Abnormal EKG    Allergy    Seasonal   Anxiety    Chicken pox    Depression    GERD (gastroesophageal reflux disease)    Hyperlipidemia    Hypertension    Urine incontinence      SURGICAL HISTORY   Past Surgical History:  Procedure Laterality Date   CHOLECYSTECTOMY  2000   LEFT HEART CATH AND CORONARY ANGIOGRAPHY N/A 01/31/2017   Procedure: LEFT HEART CATH AND CORONARY ANGIOGRAPHY;  Surgeon: Dionisio David, MD;  Location: [facility];  Service: Cardiovascular;  Laterality: N/A;     FAMILY HISTORY   Family History  Problem Relation Age of Onset   Alcohol abuse Father    Diabetes Father    Cancer Father        ?stage IV thyroid with met   Cancer Maternal Grandfather        Colon Cancer     SOCIAL HISTORY   Social History   Tobacco Use   Smoking status: Current Every Day Smoker    Packs/day: 1.00    Types: Cigarettes   Smokeless tobacco: Never Used  Substance Use Topics   Alcohol use: Yes    Alcohol/week: 21.0 standard drinks    Types: 21 Shots of liquor per week    Comment: 1 pt will last 1 week    Drug use: No     MEDICATIONS    Home Medication:    Current Medication:  Current Facility-Administered Medications:    0.9 %  sodium chloride infusion, , Intravenous, PRN, Saundra Shelling, MD, Last Rate: 10 mL/hr at 07/23/18 2347   acetaminophen (TYLENOL) tablet 650 mg, 650 mg, Oral, Q6H PRN, 650 mg at 07/24/18 1616 **OR** acetaminophen (TYLENOL) suppository 650 mg, 650 mg, Rectal, Q6H PRN, Pyreddy, Pavan, MD    azithromycin (ZITHROMAX) tablet 500 mg, 500 mg, Oral, Q2000, Ravishankar, Joellyn Quails, MD, 500 mg at 07/24/18 1608   ceFEPIme (MAXIPIME) 2 g in sodium chloride 0.9 % 100 mL IVPB, 2 g, Intravenous, Q8H, Pyreddy, Pavan, MD, Last Rate: 200 mL/hr at 07/25/18 1342, 2 g at 07/25/18 1342   guaiFENesin-dextromethorphan (ROBITUSSIN DM) 100-10 MG/5ML syrup 5 mL, 5 mL, Oral, Q4H PRN, Pyreddy, Pavan, MD, 5 mL at 07/24/18 0317   LORazepam (ATIVAN) tablet 0.5 mg, 0.5 mg, Oral, Q6H PRN, Gladstone Lighter, MD, 0.5 mg at 07/25/18 1526   nicotine (NICODERM CQ - dosed in mg/24 hours) patch 21 mg, 21 mg, Transdermal, Daily, Pyreddy, Pavan, MD, Stopped at 07/25/18 0600   ondansetron (ZOFRAN) tablet 4 mg, 4 mg, Oral, Q6H PRN **OR** ondansetron (ZOFRAN) injection 4 mg, 4 mg, Intravenous, Q6H PRN, Pyreddy, Pavan, MD   pantoprazole (PROTONIX) EC tablet 40 mg, 40 mg, Oral, Daily, Gladstone Lighter, MD, Stopped at 07/25/18 0600   pravastatin (PRAVACHOL) tablet 20 mg, 20 mg, Oral, QHS, Kalisetti, Radhika, MD   senna-docusate (Senokot-S) tablet 1 tablet, 1 tablet, Oral, QHS PRN, Saundra Shelling, MD   Derrill Memo  ON 07/26/2018] sertraline (ZOLOFT) tablet 75 mg, 75 mg, Oral, q morning - 10a, Gladstone Lighter, MD    ALLERGIES   Patient has no known allergies.     REVIEW OF SYSTEMS    Review of Systems:  Gen:  Denies  fever, sweats, chills weigh loss  HEENT: Denies blurred vision, double vision, ear pain, eye pain, hearing loss, nose bleeds, sore throat Cardiac:  No dizziness, chest pain or heaviness, chest tightness,edema Resp:   Denies cough or sputum porduction, shortness of breath,wheezing, hemoptysis,  Gi: Denies swallowing difficulty, stomach pain, nausea or vomiting, diarrhea, constipation, bowel incontinence Gu:  Denies bladder incontinence, burning urine Ext:   Denies Joint pain, stiffness or swelling Skin: Denies  skin rash, easy bruising or bleeding or hives Endoc:  Denies polyuria, polydipsia ,  polyphagia or weight change Psych:   Denies depression, insomnia or hallucinations   Other:  All other systems negative   VS: BP 112/76 (BP Location: Right Arm)    Pulse (!) 113    Temp 99.6 F (37.6 C) (Oral)    Resp 16    Ht _0  (1.778 m)    Wt 85.1 kg    SpO2 98%    BMI 26.93 kg/m      PHYSICAL EXAM    GENERAL:NAD, no fevers, chills, no weakness no fatigue HEAD: Normocephalic, atraumatic.  EYES: Pupils equal, round, reactive to light. Extraocular muscles intact. No scleral icterus.  MOUTH: Moist mucosal membrane. Dentition intact. No abscess noted.  EAR, NOSE, THROAT: Clear without exudates. No external lesions.  NECK: Supple. No thyromegaly. No nodules. No JVD.  PULMONARY: Diffuse coarse rhonchi right sided +wheezes CARDIOVASCULAR: S1 and S2. Regular rate and rhythm. No murmurs, rubs, or gallops. No edema. Pedal pulses 2+ bilaterally.  GASTROINTESTINAL: Soft, nontender, nondistended. No masses. Positive bowel sounds. No hepatosplenomegaly.  MUSCULOSKELETAL: No swelling, clubbing, or edema. Range of motion full in all extremities.  NEUROLOGIC: Cranial nerves II through XII are intact. No gross focal neurological deficits. Sensation intact. Reflexes intact.  SKIN: No ulceration, lesions, rashes, or cyanosis. Skin warm and dry. Turgor intact.  PSYCHIATRIC: Mood, affect within normal limits. The patient is awake, alert and oriented x 3. Insight, judgment intact.       IMAGING    Ct Chest W Contrast  Result Date: 07/23/2018 CLINICAL DATA:  Shortness of breath, cough and fever over the last week. Abnormal chest radiography with right pneumonia. EXAM: CT CHEST WITH CONTRAST TECHNIQUE: Multidetector CT imaging of the chest was performed during intravenous contrast administration. CONTRAST:  24m OMNIPAQUE IOHEXOL 300 MG/ML  SOLN COMPARISON:  Chest radiography same day and 07/18/2018 FINDINGS: Cardiovascular: Heart size is normal. There is mild coronary artery calcification. There  is mild aortic atherosclerosis. Mediastinum/Nodes: No mediastinal or hilar mass or lymphadenopathy. Lungs/Pleura: Left chest does not show any pleural effusion. There are some mild hazy areas of airspace filling in the left lower lobe consistent with early bronchopneumonia. On the right, there is a pleural effusion layering dependently, moderate in size. Thickness is on average 3 cm. There is airspace filling in the right lower lobe and right middle lobe consistent with bronchopneumonia. Upper lobe is largely spared. Upper Abdomen: There is a markedly abnormal appearance of low-density disease in the retrocrural region, diaphragmatic hiatus and upper abdominal retroperitoneum with multiple low-density lesions most consistent with necrotic lymphadenopathy. Processes adjacent to the pancreas but I do not definitely identify a pancreatic origin. I would suggest complete evaluation of the abdomen with CT initially.  MRI may be useful at some point. Also incidental is noted to be a 1 cm left adrenal. Musculoskeletal: Negative IMPRESSION: Right effusion layering dependently, moderate in size. Airspace filling process in the right lower lobe and right middle lobe most consistent with bronchopneumonia. This is nonspecific and could be bacterial or viral. Early airspace filling, much less advanced, in the left lower lobe. Markedly abnormal process in the lower mediastinum and retrocrural region and quite extensive within the upper abdominal retroperitoneum, with areas of low-density with indistinct margins. Findings are presumed represent necrotic adenopathy/tumor. Atypical infection could conceivably have this appearance. The etiology is indeterminate at this time, and I would recommend complete abdominal CT initially. It is possible left MRI might be useful at some point. Electronically Signed   By: Nelson Chimes M.D.   On: 07/23/2018 21:21   Ct Abdomen Pelvis W Contrast  Result Date: 07/24/2018 CLINICAL DATA:  Abnormal  retrocrural lymphadenopathy on chest CT. EXAM: CT ABDOMEN AND PELVIS WITH CONTRAST TECHNIQUE: Multidetector CT imaging of the abdomen and pelvis was performed using the standard protocol following bolus administration of intravenous contrast. CONTRAST:  158m OMNIPAQUE IOHEXOL 300 MG/ML  SOLN COMPARISON:  Chest CT 07/23/2018.  Abdomen/pelvis CT 03/01/2018. FINDINGS: Lower chest: Small right pleural effusion noted. There is airspace consolidation in the medial/paraspinal right lower lobe. Rim enhancing fluid collection dissects up from the abdomen through the esophageal hiatus and along the medial inferior right pleural space. (See below) Hepatobiliary: No suspicious focal abnormality within the liver parenchyma. Gallbladder surgically absent. No intrahepatic or extrahepatic biliary dilation. Pancreas: Pancreatic parenchyma is diffusely atrophic with parenchymal calcification in the pancreatic head suggesting chronic pancreatitis. 4 mm stone in the body of pancreas may reside in the main pancreatic duct. The relatively diffuse peripancreatic edema/inflammation seen on the study from 03/01/2018 has decreased. However, the patient now has multiple rim enhancing cystic collections in the retroperitoneal space including a 2.6 x 3.2 cm rim enhancing cyst seen on 26/2. Immediately adjacent is a 3.8 x 2.7 cm rim enhancing cyst just cranial to the common hepatic artery and portal vein. And rim enhancing fluid is seen tracking cranially up along the esophagogastric junction through the diaphragmatic hiatus and into the medial aspect of the lower right hemithorax. Spleen: No splenomegaly. No focal mass lesion. Adrenals/Urinary Tract: Right adrenal gland unremarkable. Stable appearance of a 13 mm left adrenal nodule, likely adenoma or myelolipoma. Right kidney unremarkable. Stable appearance 10 mm low-density lesion interpolar left kidney, likely a cyst. No evidence for hydroureter. The urinary bladder appears normal for the  degree of distention. Stomach/Bowel: Stomach is unremarkable. No gastric wall thickening. No evidence of outlet obstruction. Duodenum is normally positioned as is the ligament of Treitz. No small bowel wall thickening. No small bowel dilatation. The terminal ileum is normal. The appendix is normal. No gross colonic mass. No colonic wall thickening. Diverticular changes are noted in the left colon without evidence of diverticulitis. Vascular/Lymphatic: There is abdominal aortic atherosclerosis without aneurysm. Mild lymphadenopathy is noted in the gastrohepatic and hepato duodenal ligaments. Small retroperitoneal lymph nodes noted. No pelvic sidewall lymphadenopathy. Reproductive: The prostate gland and seminal vesicles are unremarkable. Other: No intraperitoneal free fluid. Musculoskeletal: No worrisome lytic or sclerotic osseous abnormality. IMPRESSION: 1. Complex multiloculated rim enhancing cystic process tracking from the head and body of pancreas up along the gastroesophageal junction, through the diaphragmatic hiatus and into the medial right lower hemithorax. Given the history of pancreatitis seen on CT of 03/01/2018, features today are most suggestive  of a complex pseudocyst dissecting up through the diaphragmatic hiatus into the medial aspect of the inferior right hemithorax/pleural space. There is an associated small right pleural effusion and adjacent medial right lower lobe airspace consolidation, likely secondary to the pseudocyst. Superinfection of the pseudocyst cannot be excluded by CT imaging. 2. Mild hepato duodenal ligament lymphadenopathy, likely reactive. Consider follow-up in 3 months to ensure stability/resolution. 3. Diffuse atrophy of pancreatic parenchyma with parenchymal calcification evident, features compatible with chronic pancreatitis. 4 mm stone in the body of pancreas may reside within the mildly prominent main pancreatic duct. 4. Stable 13 mm left adrenal nodule compatible with  benign adrenal adenoma or myelolipoma. Electronically Signed   By: Misty Stanley M.D.   On: 07/24/2018 20:59   Dg Chest Port 1 View  Result Date: 07/25/2018 CLINICAL DATA:  Post thoracentesis EXAM: PORTABLE CHEST 1 VIEW COMPARISON:  07/23/2018 FINDINGS: Right pleural effusion and right basilar pneumonia have improved. Left lung remains clear. Upper normal heart size. No pneumothorax. Unremarkable vascularity. IMPRESSION: No pneumothorax post right thoracentesis. Electronically Signed   By: Marybelle Killings M.D.   On: 07/25/2018 11:28   Dg Chest Portable 1 View  Result Date: 07/23/2018 CLINICAL DATA:  Cough and short of breath. Current treatment for pneumonia. EXAM: PORTABLE CHEST 1 VIEW COMPARISON:  07/18/2018 FINDINGS: Interval progression of infiltrate in the right lung base with small right effusion. Left lung remains clear. Cardiac enlargement without heart failure. IMPRESSION: Interval progression of right lower lobe infiltrate compatible with pneumonia. Small right pleural effusion. Electronically Signed   By: Franchot Gallo M.D.   On: 07/23/2018 18:25   Dg Chest Port 1 View  Result Date: 07/18/2018 CLINICAL DATA:  Shortness of breath for 2 days. Recent thoracentesis. EXAM: PORTABLE CHEST 1 VIEW COMPARISON:  Chest radiograph July 16, 2018 FINDINGS: Cardiac silhouette is upper limits of normal and unchanged. Mediastinal silhouette is unremarkable. Small RIGHT pleural effusion, decreased from prior examination. No pleural effusion or focal consolidation. No pneumothorax. Soft tissue planes and included osseous structures are nonacute; bilateral acromioclavicular undersurface spurring associated with impingement. IMPRESSION: 1. Small RIGHT pleural effusion, decreased from prior examination. 2. Borderline cardiomegaly. Electronically Signed   By: Elon Alas M.D.   On: 07/18/2018 19:34   Dg Chest Port 1 View  Result Date: 07/16/2018 CLINICAL DATA:  Status post thoracentesis EXAM: PORTABLE CHEST 1  VIEW COMPARISON:  Films from earlier in the same day. FINDINGS: Cardiac shadow is within normal limits. Significant reduction in right-sided pleural effusion is noted. No pneumothorax is identified. Minimal right basilar atelectasis is seen. Left lung remains clear. No bony abnormality is seen. IMPRESSION: Significant reduction in right-sided pleural effusion following thoracentesis. No pneumothorax is seen. Electronically Signed   By: Inez Catalina M.D.   On: 07/16/2018 15:18   US Thoracentesis Asp Pleural Space W/img Guide  Result Date: 07/25/2018 INDICATION: Right pleural effusion EXAM: ULTRASOUND GUIDED RIGHT THORACENTESIS MEDICATIONS: None. COMPLICATIONS: None immediate. PROCEDURE: An ultrasound guided thoracentesis was thoroughly discussed with the patient and questions answered. The benefits, risks, alternatives and complications were also discussed. The patient understands and wishes to proceed with the procedure. Written consent was obtained. Ultrasound was performed to localize and mark an adequate pocket of fluid in the right chest. The area was then prepped and draped in the normal sterile fashion. 1% Lidocaine was used for local anesthesia. Under ultrasound guidance a 6 Fr Safe-T-Centesis catheter was introduced. Thoracentesis was performed. The catheter was removed and a dressing applied. FINDINGS: A total  of approximately 700 cc of clear yellow fluid was removed. IMPRESSION: Successful ultrasound guided right thoracentesis yielding 700 cc of pleural fluid. Electronically Signed   By: Marybelle Killings M.D.   On: 07/25/2018 11:15   US Thoracentesis Asp Pleural Space W/img Guide  Result Date: 07/16/2018 INDICATION: Right-sided thoracentesis EXAM: ULTRASOUND GUIDED RIGHT THORACENTESIS MEDICATIONS: None. COMPLICATIONS: None immediate. PROCEDURE: An ultrasound guided thoracentesis was thoroughly discussed with the patient and questions answered. The benefits, risks, alternatives and complications were  also discussed. The patient understands and wishes to proceed with the procedure. Written consent was obtained. Ultrasound was performed to localize and mark an adequate pocket of fluid in the right chest. The area was then prepped and draped in the normal sterile fashion. 1% Lidocaine was used for local anesthesia. Under ultrasound guidance a 6 Fr Safe-T-Centesis catheter was introduced. Thoracentesis was performed. The catheter was removed and a dressing applied. FINDINGS: A total of approximately 1200 mL of yellow fluid was removed. Samples were sent to the laboratory as requested by the clinical team. IMPRESSION: Successful ultrasound guided right thoracentesis yielding 1.2 L of pleural fluid. Electronically Signed   By: Inez Catalina M.D.   On: 07/16/2018 15:16      ASSESSMENT/PLAN    Acute respiratory distress due to right-sided pneumonia          -Status post repeat thoracentesis- exudative profile by LDH however glucose is normal which is reassuring against empyema.  Therapeutic thoracentesis with symptom improvement.          -Complicated pseudocyst-status post surgical eval-appreciate input-possible transfer         - Microbiology/virology pending-ID on case appreciate input-Currently covid rule out, will also order Legionella, strep pneumo antigen, respiratory viral panel., will add on procalcitonin, ferritin and CRP to evaluate pre-test probability for novel corona while waiting on results.             COPD without acute exacerbation           -We will order Combivent Respimat, albuterol as needed.  No need for prednisone at this time           -Patient on Zithromax and cefepime currently    Active tobacco abuse          -On nicotine replacement via transdermal patch     Thank you for allowing me to participate in the care of this patient.     Patient/Family are satisfied with care plan and all questions have been answered.  This document was prepared using Dragon voice  recognition software and may include unintentional dictation errors.     Ottie Glazier, M.D.  Division of [facility]

## 2018-07-25 NOTE — Progress Notes (Signed)
Ch spoke briefly w/ pt via telephone. Pt shared that he initially went to the [facility] last week to hv fluid removed on R side of lung. Pt stated he was improving but then had issues w/ breathing/coughing/fluid build up which lead to his hospitalization. Pt was coughing during the conversation but did not seem to hv labored breathing. Pt shared that he was working before becoming ill. Ch shared w/ pt that pneumonia can be challenging to deal with as you may feel fine on moment and not the next. Pt stated that that has been his experience for over 2 weeks. Pt also stated he was having more fluid removed today. Ch empathize w/ pt and confirmed that he has been in communication w/ family and able to provide updates on his status. Pt was thankful for the call.    07/25/18 1000  Clinical Encounter Type  Visited With Patient  Visit Type Psychological support;Spiritual support;Social support  Spiritual Encounters  Spiritual Needs Emotional;Grief support  Stress Factors  Patient Stress Factors Exhausted;Health changes;Major life changes  Family Stress Factors None identified

## 2018-07-25 NOTE — Procedures (Signed)
R thoracentesis 700 cc clear yellow EBL 0 Comp 0

## 2018-07-25 NOTE — Progress Notes (Signed)
Date of Admission:  07/23/2018      ID: GORMAN SAFI is a 53 y.o. male  Active Problems:   Pneumonia   Pancreatic pseudocyst   ESMOND HINCH is a 53 y.o. with a history of HTN, pancreatitis,  Presents with increasing SOB and coughing up blood - pt says he has had rt sided chest pain and sob for more than 2 weeks.He last went to work on 07/13/18. He works in a Chief [employer] Presented to [facility] on 3/30 with SOB, weakness , low grade fever . CXR showed a large rt pleural effusion. He also saw pulmonolgist Dr.Fleming at [facility] clinic the same day and was sent for stat pleurocentesis. He had 1200 ml of fluid removed by IR and sent for studies. Cell count was 928(50% N and 46% M) LD was 350, protein was 4 grams. Only AFB and fungal culture were sent and not aerobic bacterial culture. He came to the ED on 4/1 with worsening SOB but left AMA without getting a CT chest. He followed up with Dr.Fleming on 07/19/18 who thought whether the cause of the effusion an exudate was a possible Aspiration pneumonia. He was asked to complete augmentin which was prescribed on 07/16/18 He presented to the ED on 4/6 with worsening SOB, gave a h/o of Tmax of 100.4  Vitals in the ED was 114/84, HR 115, RR 24 and Temp of 96.7 ,sats 93%, WBC was 16 , mildly elevated AST/ALT and corona virus test was sent and he was started on antibiotics-  PMH  pancreatitis COPD HTN Left pleural effusion Smoker Alcohol use Peripheral neuropathy Subjective: Had thorocentesis s( rt ) today and 700cc fluid removed Feeling a little better- less sob Less tightness chest  Medications:  . azithromycin  500 mg Oral Q2000  . nicotine  21 mg Transdermal Daily  . pantoprazole  40 mg Oral Daily  . pravastatin  20 mg Oral QHS  . [START ON 07/26/2018] sertraline  75 mg Oral q morning - 10a    Objective: Vital signs in last 24 hours: Temp:  [99.6 F (37.6 C)-100.8 F (38.2 C)] 100.2 F (37.9 C) (04/08 1649) Pulse Rate:   [104-117] 115 (04/08 1649) Resp:  [16] 16 (04/08 0829) BP: (91-136)/(73-87) 91/75 (04/08 1649) SpO2:  [95 %-98 %] 98 % (04/08 1649) Weight:  [85.1 kg] 85.1 kg (04/08 0239)  PHYSICAL EXAM:  General: Alert, cooperative, no distress, appears stated age.  Head: Normocephalic, without obvious abnormality, atraumatic. Eyes: Conjunctivae clear, anicteric sclerae. Pupils are equal ENT Nares normal. No drainage or sinus tenderness. Lips, mucosa, and tongue normal. No Thrush Neck: Supple, symmetrical, no adenopathy, thyroid: non tender no carotid bruit and no JVD. Back: No CVA tenderness. Lungs:b/l air entry decreased bases Rt > left Heart: Regular rate and rhythm, no murmur, rub or gallop. Abdomen: Soft, distended. Bowel sounds normal. No masses Extremities: atraumatic, no cyanosis. No edema. No clubbing Skin: No rashes or lesions. Or bruising Lymph: Cervical, supraclavicular normal. Neurologic: Grossly non-focal  Lab Results Recent Labs    07/24/18 0326  07/24/18 1555 07/25/18 0433  WBC 13.3*  --   --  15.1*  HGB 13.9   < > 13.5 12.8*  HCT 42.0   < > 40.7 38.1*  NA 139  --   --  134*  K 4.1  --   --  3.4*  CL 103  --   --  98  CO2 24  --   --  23  BUN  7  --   --  9  CREATININE 0.52*  --   --  0.54*   < > = values in this interval not displayed.   Liver Panel Recent Labs    07/23/18 1826  PROT 6.9  ALBUMIN 3.6  AST 71*  ALT 57*  ALKPHOS 165*  BILITOT 0.7   Sedimentation Rate No results for input(s): ESRSEDRATE in the last 72 hours. C-Reactive Protein Recent Labs    07/24/18 1555  CRP 11.4*    Microbiology: 4/6 BC pending Pleural fluid 4/8 Culture pending Amylase 700 Studies/Results:   multiple rim enhancing cystic collections in the retroperitoneal space including a 2.6 x 3.2 cm rim enhancing cyst seen on 26/2. Immediately adjacent is a 3.8 x 2.7 cm rim enhancing cyst just cranial to the common hepatic artery and portal vein. And rim enhancing fluid is  seen tracking cranially up along the esophagogastric junction through the diaphragmatic hiatus and into the medial aspect of the lower right hemithorax.   Assessment/Plan:  Rt pleural effusion secondary to pancreatic pseudocyst , retroperitoneal collection tracking up- high amyl;ase indicates it is pancreatic fluid.   Rt middle lobe and lower lobe bronchopneumonia could be secondary to th large effusion- he is currently on cefepime and zithromax- will change cefepime to zosyn to also treat the  retroperitoneal collection  Multiple rim enhancing collection around pancrease extending to rt pleural space- concern for infection - will treat with zosyn ( covers anerobes, enterococcus and gram neg) Pt could have pancreatic duct disruption / infected pseudocyst Seen by surgery - recommending transfer to [facility] for possible EUS drainage of fluid  Pancreatitis in NOV 2019 Still drinking 3 shots of whisky every night  COVID test pending   Discussed the management with patient and Dr.Kalisetti

## 2018-07-26 LAB — TRIGLYCERIDES, BODY FLUIDS: Triglycerides, Fluid: 25 mg/dL

## 2018-07-26 LAB — PH, BODY FLUID: pH, Body Fluid: 7.8

## 2018-07-26 LAB — CBC
HCT: 38.3 % — ABNORMAL LOW (ref 39.0–52.0)
Hemoglobin: 12.6 g/dL — ABNORMAL LOW (ref 13.0–17.0)
MCH: 34.7 pg — ABNORMAL HIGH (ref 26.0–34.0)
MCHC: 32.9 g/dL (ref 30.0–36.0)
MCV: 105.5 fL — ABNORMAL HIGH (ref 80.0–100.0)
Platelets: 224 10*3/uL (ref 150–400)
RBC: 3.63 MIL/uL — ABNORMAL LOW (ref 4.22–5.81)
RDW: 12.6 % (ref 11.5–15.5)
WBC: 14.2 10*3/uL — ABNORMAL HIGH (ref 4.0–10.5)
nRBC: 0 % (ref 0.0–0.2)

## 2018-07-26 LAB — NOVEL CORONAVIRUS, NAA (HOSP ORDER, SEND-OUT TO REF LAB; TAT 18-24 HRS): SARS-CoV-2, NAA: NOT DETECTED

## 2018-07-26 LAB — GLUCOSE, CAPILLARY
Glucose-Capillary: 92 mg/dL (ref 70–99)
Glucose-Capillary: 85 mg/dL (ref 70–99)
Glucose-Capillary: 110 mg/dL — ABNORMAL HIGH (ref 70–99)

## 2018-07-26 LAB — EXPECTORATED SPUTUM ASSESSMENT W GRAM STAIN, RFLX TO RESP C

## 2018-07-26 LAB — BASIC METABOLIC PANEL
Anion gap: 11 (ref 5–15)
BUN: 14 mg/dL (ref 6–20)
CO2: 24 mmol/L (ref 22–32)
Calcium: 8.8 mg/dL — ABNORMAL LOW (ref 8.9–10.3)
Chloride: 101 mmol/L (ref 98–111)
Creatinine, Ser: 0.65 mg/dL (ref 0.61–1.24)
GFR calc Af Amer: >60 mL/min (ref >60)
GFR calc non Af Amer: >60 mL/min (ref >60)
Glucose, Bld: 93 mg/dL (ref 70–99)
Potassium: 3.3 mmol/L — ABNORMAL LOW (ref 3.5–5.1)
Sodium: 136 mmol/L (ref 135–145)

## 2018-07-26 LAB — HIV ANTIBODY (ROUTINE TESTING W REFLEX): HIV Screen 4th Generation wRfx: NONREACTIVE

## 2018-07-26 LAB — CYTOLOGY - NON PAP

## 2018-07-26 MED ORDER — AZITHROMYCIN 500 MG PO TABS: 500.0000 mg | ORAL_TABLET | Freq: Every day | ORAL | Status: DC

## 2018-07-26 MED ORDER — NICOTINE 21 MG/24HR TD PT24: 21.0000 mg | MEDICATED_PATCH | Freq: Every day | TRANSDERMAL | 0 refills | Status: DC

## 2018-07-26 MED ORDER — PIPERACILLIN-TAZOBACTAM 3.375 G IVPB: 3.3750 g | Freq: Three times a day (TID) | INTRAVENOUS | Status: DC

## 2018-07-26 MED ORDER — SODIUM CHLORIDE 0.9% FLUSH
3.0000 mL | Freq: Two times a day (BID) | INTRAVENOUS | Status: DC
  Administered 2018-07-26 – 2018-07-27 (×3): 3 mL via INTRAVENOUS

## 2018-07-26 MED ORDER — POTASSIUM CHLORIDE CRYS ER 20 MEQ PO TBCR
40.0000 meq | EXTENDED_RELEASE_TABLET | Freq: Once | ORAL | Status: AC
  Administered 2018-07-26: 12:00:00 40 meq via ORAL
  Filled 2018-07-26: qty 2

## 2018-07-26 MED ORDER — SODIUM CHLORIDE 0.9% FLUSH: 3.0000 mL | INTRAVENOUS | Status: DC | PRN

## 2018-07-26 MED ORDER — ACETAMINOPHEN 325 MG PO TABS: 650.0000 mg | ORAL_TABLET | Freq: Four times a day (QID) | ORAL | Status: AC | PRN

## 2018-07-26 NOTE — Progress Notes (Addendum)
Brief Progress Note Patient's chart reviewed with Dr Celine Ahr.  Underwent R thoracentesis yesterday and fluid sent for culture. Respiratory status being managed by pulmonary and ID following and started patient on Zosyn for both pulmonary and intraabdominal etiologies.  Pseudocyst not amenable to IR drainage and cystogastrostomies not preformed at this facility. His leukocytosis has improved and he has been afebrile for 12+ hours.  Patient is still pending COVID-19 results.   From a surgical standpoint, patient still requires transfer to tertiary facility (prefers [facility]) for evaluation and management of this pancreatic pseudocyst. No acute emergent surgical needs identified. Still reasonable to await COVID 19 results prior to transfer.  In an effort to conserve PPE, will follow peripherally for now Continue IV Abx Follow up cultures Pain control rpn Further management per primary/consulting services   Please call with questions/concerns  -- Edison Simon, PA-C [facility] 07/26/2018, 10:15 AM [phone] M-F: 7am - 4pm  I personally reviewed the patient's chart with Mr. Olean Ree. I agree with the above documentation.Fredirick Maudlin

## 2018-07-26 NOTE — Progress Notes (Addendum)
Discussed with GI from [facility] regarding transfer- they have accepted patient to come to [facility] for an outpatient EUS and ERCP tomorrow AM by 10:30- to GI procedure area in the basement. Patient will need to be transferred to [facility] for the procedure and will be brought back to our hospital- will not be officially discharged. Discussed with charge nurse. Ambulance needs to be called in AM for the same.

## 2018-07-26 NOTE — Progress Notes (Signed)
Results of the Covid 19 testing emailed to patient's wife so she can return to work after obtaining permission from the patient.

## 2018-07-26 NOTE — Discharge Summary (Signed)
[place] [facility] [place] NAME: Darrell Santiago    MR#:  [id number]  DATE OF BIRTH:  [birth year]  DATE OF ADMISSION:  07/23/2018   ADMITTING PHYSICIAN: Saundra Shelling, MD  DATE OF DISCHARGE:  07/26/2018  PRIMARY CARE PHYSICIAN: McLean-Scocuzza, Nino Glow, MD   ADMISSION DIAGNOSIS:   Hematemesis with nausea [K92.0] Pneumonia of right lower lobe due to infectious organism ([place]) [J18.1] Sepsis, due to unspecified organism, unspecified whether acute organ dysfunction present ([place]) [A41.9]  DISCHARGE DIAGNOSIS:   Active Problems:   Pneumonia   Pancreatic pseudocyst   SECONDARY DIAGNOSIS:   Past Medical History:  Diagnosis Date  . Abnormal EKG   . Allergy    Seasonal  . Anxiety   . Chicken pox   . Depression   . GERD (gastroesophageal reflux disease)   . Hyperlipidemia   . Hypertension   . Urine incontinence     HOSPITAL COURSE:   53 year old male with past medical history significant for allergies, undiagnosed COPD/asthma not on home oxygen, GERD, hypertension presents to hospital secondary to worsening shortness of breath.  1.  Right-sided bronchopneumonia-patient had pleural effusion on the right side last week status post thoracentesis and 1.2 L removed.  -Still had moderate pleural effusion on the right side.  Status post thoracentesis  on 07/25/2018 and sent for cultures.  Amylase is significantly elevated in the body fluid. - CT chest on admission showing significant right middle and lower lobe bronchopneumonia -Appreciate pulmonary consult.  Appreciate ID consult -Currently on antibiotics with Zosyn for intra-abdominal coverage.  MRSA PCR is negative, -Currently off oxygen. -Given his symptoms, COVID-19 test is pending at this time -hmeoptysis is almost resolved.  2.  Complex pseudocyst in the abdomen-concern for complex pancreatic pseudocyst which could be infected based on CT abdomen. -Patient has had history of pancreatitis  in November 2019.  A small pseudocyst was present in the CT back then, now CT showing complex multiloculated rim-enhancing cyst extending all the way to the head and body of pancreas. -Also concern for infection given his fevers. -It is also tracking and causing recurrent right pleural effusion at this time. -General surgery has been consulted and patient would need surgery which cannot be done here, so recommended transfer to tertiary care center.  3.  Alcohol use disorder-patient not going through any active withdrawals at this time.  Continue Ativan as needed  4.  Tobacco use disorder-likely has undiagnosed COPD.   on inhalers as outpatient.  Continue to follow-up with pulmonary as outpatient for PFTs. -On nicotine patch  5.  DVT prophylaxis-teds and SCDs only, given hemoptysis.   Attempting to transfer patient to [facility] for his complex pseudocyst management   DISCHARGE CONDITIONS:   Guarded  CONSULTS OBTAINED:   Treatment Team:  Ottie Glazier, MD Olean Ree, MD  DRUG ALLERGIES:   No Known Allergies DISCHARGE MEDICATIONS:   Allergies as of 07/26/2018   No Known Allergies     Medication List    STOP taking these medications   Aleve 220 MG Caps Generic drug:  Naproxen Sodium   amoxicillin-clavulanate 500-125 MG tablet Commonly known as:  AUGMENTIN   aspirin 81 MG chewable tablet   losartan 50 MG tablet Commonly known as:  COZAAR   predniSONE 10 MG (21) Tbpk tablet Commonly known as:  STERAPRED UNI-PAK 21 TAB     TAKE these medications   acetaminophen 325 MG tablet Commonly known as:  TYLENOL Take 2 tablets (650 mg total)  by mouth every 6 (six) hours as needed for mild pain (or Fever >/= 101).   azithromycin 500 MG tablet Commonly known as:  ZITHROMAX Take 1 tablet (500 mg total) by mouth daily.   budesonide-formoterol 160-4.5 MCG/ACT inhaler Commonly known as:  Symbicort Inhale 2 puffs into the lungs 2 (two) times daily. Rinse mouth after use    ipratropium-albuterol 0.5-2.5 (3) MG/3ML Soln Commonly known as:  DUONEB Inhale 3 mLs into the lungs 4 (four) times daily.   LORazepam 0.5 MG tablet Commonly known as:  ATIVAN Take 1 tablet (0.5 mg total) by mouth daily as needed for anxiety.   multivitamin with minerals Tabs tablet Take 1 tablet by mouth daily.   nicotine 21 mg/24hr patch Commonly known as:  NICODERM CQ - dosed in mg/24 hours Place 1 patch (21 mg total) onto the skin daily. Start taking on:  July 27, 2018   OVER THE COUNTER MEDICATION Take 1 capsule by mouth daily.   pantoprazole 20 MG tablet Commonly known as:  PROTONIX Take 1 tablet (20 mg total) by mouth daily. 30 minutes before food   piperacillin-tazobactam 3.375 GM/50ML IVPB Commonly known as:  ZOSYN Inject 50 mLs (3.375 g total) into the vein every 8 (eight) hours.   pravastatin 20 MG tablet Commonly known as:  PRAVACHOL Take 1 tablet (20 mg total) by mouth at bedtime.   sertraline 50 MG tablet Commonly known as:  ZOLOFT Take 1.5 tablets (75 mg total) by mouth daily. In am   vitamin B-12 1000 MCG tablet Commonly known as:  CYANOCOBALAMIN Take 1,000 mcg by mouth daily.        DISCHARGE INSTRUCTIONS:   1. PCP f/u in 1-2 weeks 2. GI f/u as scheduled  DIET:   Cardiac diet  ACTIVITY:   Activity as tolerated  OXYGEN:   Home Oxygen: No.  Oxygen Delivery: room air  DISCHARGE LOCATION:   [facility] hospital   If you experience worsening of your admission symptoms, develop shortness of breath, life threatening emergency, suicidal or homicidal thoughts you must seek medical attention immediately by calling 911 or calling your MD immediately  if symptoms less severe.  You Must read complete instructions/literature along with all the possible adverse reactions/side effects for all the Medicines you take and that have been prescribed to you. Take any new Medicines after you have completely understood and accpet all the possible adverse  reactions/side effects.   Please note  You were cared for by a hospitalist during your hospital stay. If you have any questions about your discharge medications or the care you received while you were in the hospital after you are discharged, you can call the unit and asked to speak with the hospitalist on call if the hospitalist that took care of you is not available. Once you are discharged, your primary care physician will handle any further medical issues. Please note that NO REFILLS for any discharge medications will be authorized once you are discharged, as it is imperative that you return to your primary care physician (or establish a relationship with a primary care physician if you do not have one) for your aftercare needs so that they can reassess your need for medications and monitor your lab values.    On the day of Discharge:  VITAL SIGNS:   Blood pressure 108/75, pulse (!) 108, temperature 99.3 F (37.4 C), temperature source Oral, resp. rate 20, height 5\' 10"  (1.778 m), weight 85.1 kg, SpO2 96 %.  PHYSICAL EXAMINATION:  GENERAL:  53 y.o.-year-old patient lying in the bed with no acute distress.  EYES: Pupils equal, round, reactive to light and accommodation. No scleral icterus. Extraocular muscles intact.  HEENT: Head atraumatic, normocephalic. Oropharynx and nasopharynx clear.  Flushed face noted NECK:  Supple, no jugular venous distention. No thyroid enlargement, no tenderness.  LUNGS:no wheezing, rales,rhonchi or crepitation. No use of accessory muscles of respiration.  Significantly decreased right-sided breath sounds. CARDIOVASCULAR: S1, S2 normal. No murmurs, rubs, or gallops.  ABDOMEN: Soft, tense, nontender,, nondistended. Bowel sounds present. No organomegaly or mass.  EXTREMITIES: No pedal edema, cyanosis, or clubbing.  NEUROLOGIC: Cranial nerves II through XII are intact. Muscle strength 5/5 in all extremities. Sensation intact. Gait not checked.  PSYCHIATRIC:  The patient is alert and oriented x 3.  SKIN: No obvious rash, lesion, or ulcer.   DATA REVIEW:   CBC Recent Labs  Lab 07/26/18 0434  WBC 14.2*  HGB 12.6*  HCT 38.3*  PLT 224    Chemistries  Recent Labs  Lab 07/23/18 1826  07/26/18 0434  NA 139   < > 136  K 3.3*   < > 3.3*  CL 101   < > 101  CO2 25   < > 24  GLUCOSE 130*   < > 93  BUN 9   < > 14  CREATININE 0.45*   < > 0.65  CALCIUM 9.2   < > 8.8*  AST 71*  --   --   ALT 57*  --   --   ALKPHOS 165*  --   --   BILITOT 0.7  --   --    < > = values in this interval not displayed.     Microbiology Results  Results for orders placed or performed during the hospital encounter of 07/23/18  Blood Culture (routine x 2)     Status: None (Preliminary result)   Collection Time: 07/23/18  6:27 PM  Result Value Ref Range Status   Specimen Description BLOOD LEFT ANTECUBITAL  Final   Special Requests   Final    BOTTLES DRAWN AEROBIC AND ANAEROBIC Blood Culture adequate volume   Culture   Final    NO GROWTH 3 DAYS Performed at [facility], [address], [place], [place] [address]    Report Status PENDING  Incomplete  Blood Culture (routine x 2)     Status: None (Preliminary result)   Collection Time: 07/23/18  6:27 PM  Result Value Ref Range Status   Specimen Description BLOOD BLOOD LEFT FOREARM  Final   Special Requests   Final    BOTTLES DRAWN AEROBIC AND ANAEROBIC Blood Culture results may not be optimal due to an excessive volume of blood received in culture bottles   Culture   Final    NO GROWTH 3 DAYS Performed at [facility], [address] [place], [place] [address]    Report Status PENDING  Incomplete  MRSA PCR Screening     Status: None   Collection Time: 07/23/18 11:25 PM  Result Value Ref Range Status   MRSA by PCR NEGATIVE NEGATIVE Final    Comment:        The GeneXpert MRSA Assay (FDA approved for NASAL specimens only), is one component of a comprehensive MRSA colonization  surveillance program. It is not intended to diagnose MRSA infection nor to guide or monitor treatment for MRSA infections. Performed at [facility], [address], [place], [place] [address]   Respiratory Panel  by PCR     Status: None   Collection Time: 07/24/18  4:23 PM  Result Value Ref Range Status   Adenovirus NOT DETECTED NOT DETECTED Final   Coronavirus 229E NOT DETECTED NOT DETECTED Final    Comment: (NOTE) The Coronavirus on the Respiratory Panel, DOES NOT test for the novel  Coronavirus (2019 nCoV)    Coronavirus HKU1 NOT DETECTED NOT DETECTED Final   Coronavirus NL63 NOT DETECTED NOT DETECTED Final   Coronavirus OC43 NOT DETECTED NOT DETECTED Final   Metapneumovirus NOT DETECTED NOT DETECTED Final   Rhinovirus / Enterovirus NOT DETECTED NOT DETECTED Final   Influenza A NOT DETECTED NOT DETECTED Final   Influenza B NOT DETECTED NOT DETECTED Final   Parainfluenza Virus 1 NOT DETECTED NOT DETECTED Final   Parainfluenza Virus 2 NOT DETECTED NOT DETECTED Final   Parainfluenza Virus 3 NOT DETECTED NOT DETECTED Final   Parainfluenza Virus 4 NOT DETECTED NOT DETECTED Final   Respiratory Syncytial Virus NOT DETECTED NOT DETECTED Final   Bordetella pertussis NOT DETECTED NOT DETECTED Final   Chlamydophila pneumoniae NOT DETECTED NOT DETECTED Final   Mycoplasma pneumoniae NOT DETECTED NOT DETECTED Final    Comment: Performed at [facility], [address], [place], [place] [address]  Body fluid culture     Status: None (Preliminary result)   Collection Time: 07/25/18 11:11 AM  Result Value Ref Range Status   Specimen Description   Final    PLEURAL Performed at [facility], [address], [place], [place] [address]    Special Requests   Final    NONE Performed at [facility], [address] [place], [place] [address]    Gram Stain   Final    RARE WBC PRESENT,BOTH PMN AND MONONUCLEAR NO ORGANISMS SEEN    Culture   Final    NO  GROWTH < 24 HOURS Performed at [facility], [place] [address] [place], [place] [address]    Report Status PENDING  Incomplete  Expectorated sputum assessment w rflx to resp cult     Status: None   Collection Time: 07/25/18  5:10 PM  Result Value Ref Range Status   Specimen Description EXPECTORATED SPUTUM  Final   Special Requests NONE  Final   Sputum evaluation   Final    THIS SPECIMEN IS ACCEPTABLE FOR SPUTUM CULTURE Performed at [facility], [address], [place], [place] [address]    Report Status 07/26/2018 FINAL  Final  Culture, respiratory     Status: None (Preliminary result)   Collection Time: 07/25/18  5:10 PM  Result Value Ref Range Status   Specimen Description   Final    EXPECTORATED SPUTUM Performed at [facility], [address], [place], [place] [address]    Special Requests   Final    NONE Reflexed from [phone] Performed at [facility], [address] [place], [place] [address]    Gram Stain   Final    MODERATE WBC PRESENT,BOTH PMN AND MONONUCLEAR FEW GRAM POSITIVE COCCI FEW GRAM VARIABLE ROD FEW SQUAMOUS EPITHELIAL CELLS PRESENT    Culture   Final    CULTURE REINCUBATED FOR BETTER GROWTH Performed at [facility], [place] [address] [place], [place] [address]    Report Status PENDING  Incomplete    RADIOLOGY:  No results found.   Management plans discussed with the patient, family and they are in agreement.  CODE STATUS:  Code Status Orders  (From admission, onward)         Start     Ordered   07/23/18 2223  Full code  Continuous     07/23/18 2222        Code Status History    Date Active Date Inactive Code Status Order ID Comments User Context   03/02/2018 0322 03/03/2018 1839 Full Code [id number]  Harrie Foreman, MD Inpatient      TOTAL TIME TAKING CARE OF THIS PATIENT: 38 minutes.    Gladstone Lighter M.D on 07/26/2018 at 2:32 PM  Between 7am to 6pm - Pager - [phone]  After  6pm go to [email or web address] - Proofreader  [employer] [place] [facility]  Office  [phone]  CC: Primary care physician; McLean-Scocuzza, Nino Glow, MD   Note: This dictation was prepared with Dragon dictation along with smaller phrase technology. Any transcriptional errors that result from this process are unintentional.

## 2018-07-26 NOTE — Progress Notes (Signed)
[facility] NAME: Darrell Santiago    MR#:  [id number]  DATE OF BIRTH:  [birth year]  SUBJECTIVE:  CHIEF COMPLAINT:   Chief Complaint  Patient presents with   Shortness of Breath   -Patient with recurrent right pleural effusion, status post thoracentesis yesterday and almost 700 cc fluid taken out. -Abdominal CT showing possible complex pseudocyst -COVID 19 test is pending - low grade fevers  REVIEW OF SYSTEMS:  Review of Systems  Constitutional: Positive for fever and malaise/fatigue. Negative for chills.  HENT: Negative for congestion, ear discharge, hearing loss and nosebleeds.   Eyes: Negative for blurred vision and double vision.  Respiratory: Positive for cough, shortness of breath and wheezing.   Cardiovascular: Negative for chest pain, palpitations and leg swelling.  Gastrointestinal: Positive for nausea. Negative for abdominal pain, constipation, diarrhea and vomiting.  Genitourinary: Negative for dysuria.  Musculoskeletal: Positive for myalgias.  Neurological: Negative for dizziness, focal weakness, seizures, weakness and headaches.  Psychiatric/Behavioral: Negative for depression.    DRUG ALLERGIES:  No Known Allergies  VITALS:  Blood pressure 108/75, pulse (!) 108, temperature 99.3 F (37.4 C), temperature source Oral, resp. rate 20, height 5\' 10"  (1.778 m), weight 85.1 kg, SpO2 96 %.  PHYSICAL EXAMINATION:  Physical Exam   GENERAL:  53 y.o.-year-old patient lying in the bed with no acute distress.  EYES: Pupils equal, round, reactive to light and accommodation. No scleral icterus. Extraocular muscles intact.  HEENT: Head atraumatic, normocephalic. Oropharynx and nasopharynx clear.  Flushed face noted NECK:  Supple, no jugular venous distention. No thyroid enlargement, no tenderness.  LUNGS:no wheezing, rales,rhonchi or crepitation. No use of accessory muscles of respiration.  Significantly decreased  right-sided breath sounds. CARDIOVASCULAR: S1, S2 normal. No murmurs, rubs, or gallops.  ABDOMEN: Soft, tense, nontender,, nondistended. Bowel sounds present. No organomegaly or mass.  EXTREMITIES: No pedal edema, cyanosis, or clubbing.  NEUROLOGIC: Cranial nerves II through XII are intact. Muscle strength 5/5 in all extremities. Sensation intact. Gait not checked.  PSYCHIATRIC: The patient is alert and oriented x 3.  SKIN: No obvious rash, lesion, or ulcer.    LABORATORY PANEL:   CBC Recent Labs  Lab 07/26/18 0434  WBC 14.2*  HGB 12.6*  HCT 38.3*  PLT 224   ------------------------------------------------------------------------------------------------------------------  Chemistries  Recent Labs  Lab 07/23/18 1826  07/26/18 0434  NA 139   < > 136  K 3.3*   < > 3.3*  CL 101   < > 101  CO2 25   < > 24  GLUCOSE 130*   < > 93  BUN 9   < > 14  CREATININE 0.45*   < > 0.65  CALCIUM 9.2   < > 8.8*  AST 71*  --   --   ALT 57*  --   --   ALKPHOS 165*  --   --   BILITOT 0.7  --   --    < > = values in this interval not displayed.   ------------------------------------------------------------------------------------------------------------------  Cardiac Enzymes Recent Labs  Lab 07/23/18 1826  TROPONINI <0.03   ------------------------------------------------------------------------------------------------------------------  RADIOLOGY:  Ct Abdomen Pelvis W Contrast  Result Date: 07/24/2018 CLINICAL DATA:  Abnormal retrocrural lymphadenopathy on chest CT. EXAM: CT ABDOMEN AND PELVIS WITH CONTRAST TECHNIQUE: Multidetector CT imaging of the abdomen and pelvis was performed using the standard protocol following bolus administration of intravenous contrast. CONTRAST:  18mL OMNIPAQUE IOHEXOL 300 MG/ML  SOLN COMPARISON:  Chest CT  07/23/2018.  Abdomen/pelvis CT 03/01/2018. FINDINGS: Lower chest: Small right pleural effusion noted. There is airspace consolidation in the  medial/paraspinal right lower lobe. Rim enhancing fluid collection dissects up from the abdomen through the esophageal hiatus and along the medial inferior right pleural space. (See below) Hepatobiliary: No suspicious focal abnormality within the liver parenchyma. Gallbladder surgically absent. No intrahepatic or extrahepatic biliary dilation. Pancreas: Pancreatic parenchyma is diffusely atrophic with parenchymal calcification in the pancreatic head suggesting chronic pancreatitis. 4 mm stone in the body of pancreas may reside in the main pancreatic duct. The relatively diffuse peripancreatic edema/inflammation seen on the study from 03/01/2018 has decreased. However, the patient now has multiple rim enhancing cystic collections in the retroperitoneal space including a 2.6 x 3.2 cm rim enhancing cyst seen on 26/2. Immediately adjacent is a 3.8 x 2.7 cm rim enhancing cyst just cranial to the common hepatic artery and portal vein. And rim enhancing fluid is seen tracking cranially up along the esophagogastric junction through the diaphragmatic hiatus and into the medial aspect of the lower right hemithorax. Spleen: No splenomegaly. No focal mass lesion. Adrenals/Urinary Tract: Right adrenal gland unremarkable. Stable appearance of a 13 mm left adrenal nodule, likely adenoma or myelolipoma. Right kidney unremarkable. Stable appearance 10 mm low-density lesion interpolar left kidney, likely a cyst. No evidence for hydroureter. The urinary bladder appears normal for the degree of distention. Stomach/Bowel: Stomach is unremarkable. No gastric wall thickening. No evidence of outlet obstruction. Duodenum is normally positioned as is the ligament of Treitz. No small bowel wall thickening. No small bowel dilatation. The terminal ileum is normal. The appendix is normal. No gross colonic mass. No colonic wall thickening. Diverticular changes are noted in the left colon without evidence of diverticulitis. Vascular/Lymphatic:  There is abdominal aortic atherosclerosis without aneurysm. Mild lymphadenopathy is noted in the gastrohepatic and hepato duodenal ligaments. Small retroperitoneal lymph nodes noted. No pelvic sidewall lymphadenopathy. Reproductive: The prostate gland and seminal vesicles are unremarkable. Other: No intraperitoneal free fluid. Musculoskeletal: No worrisome lytic or sclerotic osseous abnormality. IMPRESSION: 1. Complex multiloculated rim enhancing cystic process tracking from the head and body of pancreas up along the gastroesophageal junction, through the diaphragmatic hiatus and into the medial right lower hemithorax. Given the history of pancreatitis seen on CT of 03/01/2018, features today are most suggestive of a complex pseudocyst dissecting up through the diaphragmatic hiatus into the medial aspect of the inferior right hemithorax/pleural space. There is an associated small right pleural effusion and adjacent medial right lower lobe airspace consolidation, likely secondary to the pseudocyst. Superinfection of the pseudocyst cannot be excluded by CT imaging. 2. Mild hepato duodenal ligament lymphadenopathy, likely reactive. Consider follow-up in 3 months to ensure stability/resolution. 3. Diffuse atrophy of pancreatic parenchyma with parenchymal calcification evident, features compatible with chronic pancreatitis. 4 mm stone in the body of pancreas may reside within the mildly prominent main pancreatic duct. 4. Stable 13 mm left adrenal nodule compatible with benign adrenal adenoma or myelolipoma. Electronically Signed   By: Misty Stanley M.D.   On: 07/24/2018 20:59   Dg Chest Port 1 View  Result Date: 07/25/2018 CLINICAL DATA:  Post thoracentesis EXAM: PORTABLE CHEST 1 VIEW COMPARISON:  07/23/2018 FINDINGS: Right pleural effusion and right basilar pneumonia have improved. Left lung remains clear. Upper normal heart size. No pneumothorax. Unremarkable vascularity. IMPRESSION: No pneumothorax post right  thoracentesis. Electronically Signed   By: Marybelle Killings M.D.   On: 07/25/2018 11:28   US Thoracentesis Asp Pleural Space W/img Guide  Result Date: 07/25/2018 INDICATION: Right pleural effusion EXAM: ULTRASOUND GUIDED RIGHT THORACENTESIS MEDICATIONS: None. COMPLICATIONS: None immediate. PROCEDURE: An ultrasound guided thoracentesis was thoroughly discussed with the patient and questions answered. The benefits, risks, alternatives and complications were also discussed. The patient understands and wishes to proceed with the procedure. Written consent was obtained. Ultrasound was performed to localize and mark an adequate pocket of fluid in the right chest. The area was then prepped and draped in the normal sterile fashion. 1% Lidocaine was used for local anesthesia. Under ultrasound guidance a 6 Fr Safe-T-Centesis catheter was introduced. Thoracentesis was performed. The catheter was removed and a dressing applied. FINDINGS: A total of approximately 700 cc of clear yellow fluid was removed. IMPRESSION: Successful ultrasound guided right thoracentesis yielding 700 cc of pleural fluid. Electronically Signed   By: Marybelle Killings M.D.   On: 07/25/2018 11:15    EKG:   Orders placed or performed during the hospital encounter of 07/23/18   ED EKG 12-Lead   ED EKG 12-Lead    ASSESSMENT AND PLAN:   53 year old male with past medical history significant for allergies, undiagnosed COPD/asthma not on home oxygen, GERD, hypertension presents to hospital secondary to worsening shortness of breath.  1.  Right-sided bronchopneumonia-patient had pleural effusion on the right side last week status post thoracentesis and 1.2 L removed.  -Still had moderate pleural effusion on the right side.  Status post thoracentesis  on 07/25/2018 and sent for cultures.  Amylase is significantly elevated in the body fluid. - CT chest on admission showing significant right middle and lower lobe bronchopneumonia -Appreciate pulmonary  consult.  Appreciate ID consult -Currently on antibiotics with Zosyn for intra-abdominal coverage.  MRSA PCR is negative, -Currently off oxygen. -Given his symptoms, COVID-19 test is pending at this time -Mouth disease is almost resolved.  2.  Complex pseudocyst in the abdomen-concern for complex pancreatic pseudocyst which could be infected based on CT abdomen. -Patient has had history of pancreatitis in November 2019.  A small pseudocyst was present in the CT back then, now CT showing complex multiloculated rim-enhancing cyst extending all the way to the head and body of pancreas. -Also concern for infection given his fevers. -It is also tracking and causing recurrent right pleural effusion at this time. -General surgery has been consulted and patient would need surgery which cannot be done here, so recommended transfer to tertiary care center.  3.  Alcohol use disorder-patient not going through any active withdrawals at this time.  Continue Ativan as needed  4.  Tobacco use disorder-likely has undiagnosed COPD.   on inhalers as outpatient.  Continue to follow-up with pulmonary as outpatient for PFTs. -On nicotine patch  5.  DVT prophylaxis-teds and SCDs only, given hemoptysis.   Attempting to transfer patient to [facility] for his complex pseudocyst   All the records are reviewed and case discussed with Care Management/Social Workerr. Management plans discussed with the patient, family and they are in agreement.  CODE STATUS: Full code  TOTAL TIME TAKING CARE OF THIS PATIENT: 46 minutes.   POSSIBLE D/C IN ? DAYS, DEPENDING ON CLINICAL CONDITION.   Gladstone Lighter M.D on 07/26/2018 at 11:54 AM  Between 7am to 6pm - Pager - [phone]  After 6pm go to [email or web address] - password [email or web address] [facility]  Office  [phone]  CC: Primary care physician; McLean-Scocuzza, Nino Glow, MD

## 2018-07-26 NOTE — Progress Notes (Signed)
Pulmonary Medicine          Date: 07/26/2018,   MRN# [id number] Darrell Santiago 1966/03/24     AdmissionWeight: 88.5 kg                 CurrentWeight: 85.1 kg       SUBJECTIVE   Patient clinically improved, eating during eval this am, no chest pain/discomfort.  Breathing significantly improved post repeat thoracentesis but likely short lived benefit.    PAST MEDICAL HISTORY   Past Medical History:  Diagnosis Date   Abnormal EKG    Allergy    Seasonal   Anxiety    Chicken pox    Depression    GERD (gastroesophageal reflux disease)    Hyperlipidemia    Hypertension    Urine incontinence      SURGICAL HISTORY   Past Surgical History:  Procedure Laterality Date   CHOLECYSTECTOMY  2000   LEFT HEART CATH AND CORONARY ANGIOGRAPHY N/A 01/31/2017   Procedure: LEFT HEART CATH AND CORONARY ANGIOGRAPHY;  Surgeon: Dionisio David, MD;  Location: [facility];  Service: Cardiovascular;  Laterality: N/A;     FAMILY HISTORY   Family History  Problem Relation Age of Onset   Alcohol abuse Father    Diabetes Father    Cancer Father        ?stage IV thyroid with met   Cancer Maternal Grandfather        Colon Cancer     SOCIAL HISTORY   Social History   Tobacco Use   Smoking status: Current Every Day Smoker    Packs/day: 1.00    Types: Cigarettes   Smokeless tobacco: Never Used  Substance Use Topics   Alcohol use: Yes    Alcohol/week: 21.0 standard drinks    Types: 21 Shots of liquor per week    Comment: 1 pt will last 1 week    Drug use: No     MEDICATIONS    Home Medication:    Current Medication:  Current Facility-Administered Medications:    0.9 %  sodium chloride infusion, , Intravenous, PRN, Saundra Shelling, MD, Last Rate: 10 mL/hr at 07/23/18 2347   acetaminophen (TYLENOL) tablet 650 mg, 650 mg, Oral, Q6H PRN, 650 mg at 07/25/18 2245 **OR** acetaminophen (TYLENOL) suppository 650 mg, 650 mg, Rectal, Q6H  PRN, Pyreddy, Pavan, MD   azithromycin (ZITHROMAX) tablet 500 mg, 500 mg, Oral, Q2000, Ravishankar, Joellyn Quails, MD, 500 mg at 07/25/18 2244   guaiFENesin-dextromethorphan (ROBITUSSIN DM) 100-10 MG/5ML syrup 5 mL, 5 mL, Oral, Q4H PRN, Pyreddy, Pavan, MD, 5 mL at 07/24/18 0317   LORazepam (ATIVAN) tablet 0.5 mg, 0.5 mg, Oral, Q6H PRN, Gladstone Lighter, MD, 0.5 mg at 07/26/18 0520   nicotine (NICODERM CQ - dosed in mg/24 hours) patch 21 mg, 21 mg, Transdermal, Daily, Pyreddy, Pavan, MD, 21 mg at 07/26/18 0520   ondansetron (ZOFRAN) tablet 4 mg, 4 mg, Oral, Q6H PRN **OR** ondansetron (ZOFRAN) injection 4 mg, 4 mg, Intravenous, Q6H PRN, Pyreddy, Pavan, MD   pantoprazole (PROTONIX) EC tablet 40 mg, 40 mg, Oral, Daily, Kalisetti, Radhika, MD, 40 mg at 07/26/18 0520   piperacillin-tazobactam (ZOSYN) IVPB 3.375 g, 3.375 g, Intravenous, Q8H, Ravishankar, Jayashree, MD, Last Rate: 12.5 mL/hr at 07/26/18 0522, 3.375 g at 07/26/18 0522   pravastatin (PRAVACHOL) tablet 20 mg, 20 mg, Oral, QHS, Kalisetti, Radhika, MD, 20 mg at 07/25/18 2251   senna-docusate (Senokot-S) tablet 1 tablet, 1 tablet, Oral, QHS PRN, Pyreddy, Pavan,  MD   sertraline (ZOLOFT) tablet 75 mg, 75 mg, Oral, q morning - 10a, Kalisetti, Radhika, MD, 75 mg at 07/26/18 0855   sodium chloride flush (NS) 0.9 % injection 3 mL, 3 mL, Intravenous, Q12H, Kalisetti, Radhika, MD   sodium chloride flush (NS) 0.9 % injection 3 mL, 3 mL, Intravenous, PRN, Gladstone Lighter, MD    ALLERGIES   Patient has no known allergies.     REVIEW OF SYSTEMS    Review of Systems:  Gen:  Denies  fever, sweats, chills weigh loss  HEENT: Denies blurred vision, double vision, ear pain, eye pain, hearing loss, nose bleeds, sore throat Cardiac:  No dizziness, chest pain or heaviness, chest tightness,edema Resp:   Denies cough or sputum porduction, shortness of breath,wheezing, hemoptysis,  Gi: Denies swallowing difficulty, stomach pain, nausea or  vomiting, diarrhea, constipation, bowel incontinence Gu:  Denies bladder incontinence, burning urine Ext:   Denies Joint pain, stiffness or swelling Skin: Denies  skin rash, easy bruising or bleeding or hives Endoc:  Denies polyuria, polydipsia , polyphagia or weight change Psych:   Denies depression, insomnia or hallucinations   Other:  All other systems negative   VS: BP 108/75 (BP Location: Right Arm)    Pulse (!) 108    Temp 99.3 F (37.4 C) (Oral)    Resp 20    Ht _0  (1.778 m)    Wt 85.1 kg    SpO2 96%    BMI 26.93 kg/m      PHYSICAL EXAM    GENERAL:NAD, no fevers, chills, no weakness no fatigue HEAD: Normocephalic, atraumatic.  EYES: Pupils equal, round, reactive to light. Extraocular muscles intact. No scleral icterus.  MOUTH: Moist mucosal membrane. Dentition intact. No abscess noted.  EAR, NOSE, THROAT: Clear without exudates. No external lesions.  NECK: Supple. No thyromegaly. No nodules. No JVD.  PULMONARY: Bilateral rhonchi, improvedment in breaths sounds bilaterally CARDIOVASCULAR: S1 and S2. Regular rate and rhythm. No murmurs, rubs, or gallops. No edema. Pedal pulses 2+ bilaterally.  GASTROINTESTINAL: Soft, nontender, nondistended. No masses. Positive bowel sounds. No hepatosplenomegaly.  MUSCULOSKELETAL: No swelling, clubbing, or edema. Range of motion full in all extremities.  NEUROLOGIC: Cranial nerves II through XII are intact. No gross focal neurological deficits. Sensation intact. Reflexes intact.  SKIN: No ulceration, lesions, rashes, or cyanosis. Skin warm and dry. Turgor intact.  PSYCHIATRIC: Mood, affect within normal limits. The patient is awake, alert and oriented x 3. Insight, judgment intact.       IMAGING    Ct Chest W Contrast  Result Date: 07/23/2018 CLINICAL DATA:  Shortness of breath, cough and fever over the last week. Abnormal chest radiography with right pneumonia. EXAM: CT CHEST WITH CONTRAST TECHNIQUE: Multidetector CT imaging of the  chest was performed during intravenous contrast administration. CONTRAST:  42m OMNIPAQUE IOHEXOL 300 MG/ML  SOLN COMPARISON:  Chest radiography same day and 07/18/2018 FINDINGS: Cardiovascular: Heart size is normal. There is mild coronary artery calcification. There is mild aortic atherosclerosis. Mediastinum/Nodes: No mediastinal or hilar mass or lymphadenopathy. Lungs/Pleura: Left chest does not show any pleural effusion. There are some mild hazy areas of airspace filling in the left lower lobe consistent with early bronchopneumonia. On the right, there is a pleural effusion layering dependently, moderate in size. Thickness is on average 3 cm. There is airspace filling in the right lower lobe and right middle lobe consistent with bronchopneumonia. Upper lobe is largely spared. Upper Abdomen: There is a markedly abnormal appearance of low-density disease in  the retrocrural region, diaphragmatic hiatus and upper abdominal retroperitoneum with multiple low-density lesions most consistent with necrotic lymphadenopathy. Processes adjacent to the pancreas but I do not definitely identify a pancreatic origin. I would suggest complete evaluation of the abdomen with CT initially. MRI may be useful at some point. Also incidental is noted to be a 1 cm left adrenal. Musculoskeletal: Negative IMPRESSION: Right effusion layering dependently, moderate in size. Airspace filling process in the right lower lobe and right middle lobe most consistent with bronchopneumonia. This is nonspecific and could be bacterial or viral. Early airspace filling, much less advanced, in the left lower lobe. Markedly abnormal process in the lower mediastinum and retrocrural region and quite extensive within the upper abdominal retroperitoneum, with areas of low-density with indistinct margins. Findings are presumed represent necrotic adenopathy/tumor. Atypical infection could conceivably have this appearance. The etiology is indeterminate at this  time, and I would recommend complete abdominal CT initially. It is possible left MRI might be useful at some point. Electronically Signed   By: Nelson Chimes M.D.   On: 07/23/2018 21:21   Ct Abdomen Pelvis W Contrast  Result Date: 07/24/2018 CLINICAL DATA:  Abnormal retrocrural lymphadenopathy on chest CT. EXAM: CT ABDOMEN AND PELVIS WITH CONTRAST TECHNIQUE: Multidetector CT imaging of the abdomen and pelvis was performed using the standard protocol following bolus administration of intravenous contrast. CONTRAST:  184m OMNIPAQUE IOHEXOL 300 MG/ML  SOLN COMPARISON:  Chest CT 07/23/2018.  Abdomen/pelvis CT 03/01/2018. FINDINGS: Lower chest: Small right pleural effusion noted. There is airspace consolidation in the medial/paraspinal right lower lobe. Rim enhancing fluid collection dissects up from the abdomen through the esophageal hiatus and along the medial inferior right pleural space. (See below) Hepatobiliary: No suspicious focal abnormality within the liver parenchyma. Gallbladder surgically absent. No intrahepatic or extrahepatic biliary dilation. Pancreas: Pancreatic parenchyma is diffusely atrophic with parenchymal calcification in the pancreatic head suggesting chronic pancreatitis. 4 mm stone in the body of pancreas may reside in the main pancreatic duct. The relatively diffuse peripancreatic edema/inflammation seen on the study from 03/01/2018 has decreased. However, the patient now has multiple rim enhancing cystic collections in the retroperitoneal space including a 2.6 x 3.2 cm rim enhancing cyst seen on 26/2. Immediately adjacent is a 3.8 x 2.7 cm rim enhancing cyst just cranial to the common hepatic artery and portal vein. And rim enhancing fluid is seen tracking cranially up along the esophagogastric junction through the diaphragmatic hiatus and into the medial aspect of the lower right hemithorax. Spleen: No splenomegaly. No focal mass lesion. Adrenals/Urinary Tract: Right adrenal gland  unremarkable. Stable appearance of a 13 mm left adrenal nodule, likely adenoma or myelolipoma. Right kidney unremarkable. Stable appearance 10 mm low-density lesion interpolar left kidney, likely a cyst. No evidence for hydroureter. The urinary bladder appears normal for the degree of distention. Stomach/Bowel: Stomach is unremarkable. No gastric wall thickening. No evidence of outlet obstruction. Duodenum is normally positioned as is the ligament of Treitz. No small bowel wall thickening. No small bowel dilatation. The terminal ileum is normal. The appendix is normal. No gross colonic mass. No colonic wall thickening. Diverticular changes are noted in the left colon without evidence of diverticulitis. Vascular/Lymphatic: There is abdominal aortic atherosclerosis without aneurysm. Mild lymphadenopathy is noted in the gastrohepatic and hepato duodenal ligaments. Small retroperitoneal lymph nodes noted. No pelvic sidewall lymphadenopathy. Reproductive: The prostate gland and seminal vesicles are unremarkable. Other: No intraperitoneal free fluid. Musculoskeletal: No worrisome lytic or sclerotic osseous abnormality. IMPRESSION: 1. Complex multiloculated  rim enhancing cystic process tracking from the head and body of pancreas up along the gastroesophageal junction, through the diaphragmatic hiatus and into the medial right lower hemithorax. Given the history of pancreatitis seen on CT of 03/01/2018, features today are most suggestive of a complex pseudocyst dissecting up through the diaphragmatic hiatus into the medial aspect of the inferior right hemithorax/pleural space. There is an associated small right pleural effusion and adjacent medial right lower lobe airspace consolidation, likely secondary to the pseudocyst. Superinfection of the pseudocyst cannot be excluded by CT imaging. 2. Mild hepato duodenal ligament lymphadenopathy, likely reactive. Consider follow-up in 3 months to ensure stability/resolution. 3.  Diffuse atrophy of pancreatic parenchyma with parenchymal calcification evident, features compatible with chronic pancreatitis. 4 mm stone in the body of pancreas may reside within the mildly prominent main pancreatic duct. 4. Stable 13 mm left adrenal nodule compatible with benign adrenal adenoma or myelolipoma. Electronically Signed   By: Misty Stanley M.D.   On: 07/24/2018 20:59   Dg Chest Port 1 View  Result Date: 07/25/2018 CLINICAL DATA:  Post thoracentesis EXAM: PORTABLE CHEST 1 VIEW COMPARISON:  07/23/2018 FINDINGS: Right pleural effusion and right basilar pneumonia have improved. Left lung remains clear. Upper normal heart size. No pneumothorax. Unremarkable vascularity. IMPRESSION: No pneumothorax post right thoracentesis. Electronically Signed   By: Marybelle Killings M.D.   On: 07/25/2018 11:28   Dg Chest Portable 1 View  Result Date: 07/23/2018 CLINICAL DATA:  Cough and short of breath. Current treatment for pneumonia. EXAM: PORTABLE CHEST 1 VIEW COMPARISON:  07/18/2018 FINDINGS: Interval progression of infiltrate in the right lung base with small right effusion. Left lung remains clear. Cardiac enlargement without heart failure. IMPRESSION: Interval progression of right lower lobe infiltrate compatible with pneumonia. Small right pleural effusion. Electronically Signed   By: Franchot Gallo M.D.   On: 07/23/2018 18:25   Dg Chest Port 1 View  Result Date: 07/18/2018 CLINICAL DATA:  Shortness of breath for 2 days. Recent thoracentesis. EXAM: PORTABLE CHEST 1 VIEW COMPARISON:  Chest radiograph July 16, 2018 FINDINGS: Cardiac silhouette is upper limits of normal and unchanged. Mediastinal silhouette is unremarkable. Small RIGHT pleural effusion, decreased from prior examination. No pleural effusion or focal consolidation. No pneumothorax. Soft tissue planes and included osseous structures are nonacute; bilateral acromioclavicular undersurface spurring associated with impingement. IMPRESSION: 1. Small  RIGHT pleural effusion, decreased from prior examination. 2. Borderline cardiomegaly. Electronically Signed   By: Elon Alas M.D.   On: 07/18/2018 19:34   Dg Chest Port 1 View  Result Date: 07/16/2018 CLINICAL DATA:  Status post thoracentesis EXAM: PORTABLE CHEST 1 VIEW COMPARISON:  Films from earlier in the same day. FINDINGS: Cardiac shadow is within normal limits. Significant reduction in right-sided pleural effusion is noted. No pneumothorax is identified. Minimal right basilar atelectasis is seen. Left lung remains clear. No bony abnormality is seen. IMPRESSION: Significant reduction in right-sided pleural effusion following thoracentesis. No pneumothorax is seen. Electronically Signed   By: Inez Catalina M.D.   On: 07/16/2018 15:18   US Thoracentesis Asp Pleural Space W/img Guide  Result Date: 07/25/2018 INDICATION: Right pleural effusion EXAM: ULTRASOUND GUIDED RIGHT THORACENTESIS MEDICATIONS: None. COMPLICATIONS: None immediate. PROCEDURE: An ultrasound guided thoracentesis was thoroughly discussed with the patient and questions answered. The benefits, risks, alternatives and complications were also discussed. The patient understands and wishes to proceed with the procedure. Written consent was obtained. Ultrasound was performed to localize and mark an adequate pocket of fluid in the right chest.  The area was then prepped and draped in the normal sterile fashion. 1% Lidocaine was used for local anesthesia. Under ultrasound guidance a 6 Fr Safe-T-Centesis catheter was introduced. Thoracentesis was performed. The catheter was removed and a dressing applied. FINDINGS: A total of approximately 700 cc of clear yellow fluid was removed. IMPRESSION: Successful ultrasound guided right thoracentesis yielding 700 cc of pleural fluid. Electronically Signed   By: Marybelle Killings M.D.   On: 07/25/2018 11:15   US Thoracentesis Asp Pleural Space W/img Guide  Result Date: 07/16/2018 INDICATION: Right-sided  thoracentesis EXAM: ULTRASOUND GUIDED RIGHT THORACENTESIS MEDICATIONS: None. COMPLICATIONS: None immediate. PROCEDURE: An ultrasound guided thoracentesis was thoroughly discussed with the patient and questions answered. The benefits, risks, alternatives and complications were also discussed. The patient understands and wishes to proceed with the procedure. Written consent was obtained. Ultrasound was performed to localize and mark an adequate pocket of fluid in the right chest. The area was then prepped and draped in the normal sterile fashion. 1% Lidocaine was used for local anesthesia. Under ultrasound guidance a 6 Fr Safe-T-Centesis catheter was introduced. Thoracentesis was performed. The catheter was removed and a dressing applied. FINDINGS: A total of approximately 1200 mL of yellow fluid was removed. Samples were sent to the laboratory as requested by the clinical team. IMPRESSION: Successful ultrasound guided right thoracentesis yielding 1.2 L of pleural fluid. Electronically Signed   By: Inez Catalina M.D.   On: 07/16/2018 15:16      ASSESSMENT/PLAN   Acute respiratory distress due to right-sided pneumonia -Status post repeat thoracentesis- exudative by LDH, pH pending, gluc normal - on empiric abx for abdominal process as per ID appreciate recommendations          -Complicated pseudocyst-status post surgical eval-appreciate input-for transfer to [place] with Dr Tressia Miners -Microbiology/virology pending-ID on case appreciate input-Currentlycovidrule out, will also order Legionella, strep pneumo antigen, respiratory viral panel., will add on procalcitonin, ferritin and CRP to evaluate pre-test probability for novel corona while waiting on results.            -Resp cx - GPCs and gram variable rods - on pip/tazo and zithromax empirically    COPD without acute exacerbation -We will order Combivent Respimat, albuterol as needed. No need for  prednisone at this time -Patient on abx    Active tobacco abuse -On nicotine replacement via transdermal patch      Thank you for allowing me to participate in the care of this patient.   Patient/Family are satisfied with care plan and all questions have been answered.  This document was prepared using Dragon voice recognition software and may include unintentional dictation errors.     Ottie Glazier, M.D.  Division of [place]

## 2018-07-27 ENCOUNTER — Inpatient Hospital Stay: Admit: 2018-07-27 | Payer: BLUE CROSS/BLUE SHIELD

## 2018-07-27 DIAGNOSIS — K8689 Other specified diseases of pancreas: Secondary | ICD-10-CM | POA: Diagnosis not present

## 2018-07-27 DIAGNOSIS — K219 Gastro-esophageal reflux disease without esophagitis: Secondary | ICD-10-CM | POA: Diagnosis not present

## 2018-07-27 DIAGNOSIS — K863 Pseudocyst of pancreas: Secondary | ICD-10-CM | POA: Diagnosis not present

## 2018-07-27 DIAGNOSIS — F1010 Alcohol abuse, uncomplicated: Secondary | ICD-10-CM | POA: Diagnosis not present

## 2018-07-27 DIAGNOSIS — F17210 Nicotine dependence, cigarettes, uncomplicated: Secondary | ICD-10-CM | POA: Diagnosis not present

## 2018-07-27 DIAGNOSIS — Z6825 Body mass index (BMI) 25.0-25.9, adult: Secondary | ICD-10-CM | POA: Diagnosis not present

## 2018-07-27 DIAGNOSIS — J90 Pleural effusion, not elsewhere classified: Secondary | ICD-10-CM

## 2018-07-27 DIAGNOSIS — J180 Bronchopneumonia, unspecified organism: Secondary | ICD-10-CM | POA: Diagnosis not present

## 2018-07-27 DIAGNOSIS — F329 Major depressive disorder, single episode, unspecified: Secondary | ICD-10-CM | POA: Diagnosis not present

## 2018-07-27 DIAGNOSIS — I10 Essential (primary) hypertension: Secondary | ICD-10-CM | POA: Diagnosis not present

## 2018-07-27 DIAGNOSIS — E43 Unspecified severe protein-calorie malnutrition: Secondary | ICD-10-CM | POA: Diagnosis not present

## 2018-07-27 DIAGNOSIS — E785 Hyperlipidemia, unspecified: Secondary | ICD-10-CM | POA: Diagnosis not present

## 2018-07-27 DIAGNOSIS — E7800 Pure hypercholesterolemia, unspecified: Secondary | ICD-10-CM | POA: Diagnosis not present

## 2018-07-27 DIAGNOSIS — F419 Anxiety disorder, unspecified: Secondary | ICD-10-CM | POA: Diagnosis not present

## 2018-07-27 DIAGNOSIS — J440 Chronic obstructive pulmonary disease with acute lower respiratory infection: Secondary | ICD-10-CM | POA: Diagnosis not present

## 2018-07-27 DIAGNOSIS — K861 Other chronic pancreatitis: Secondary | ICD-10-CM | POA: Diagnosis not present

## 2018-07-27 DIAGNOSIS — D7589 Other specified diseases of blood and blood-forming organs: Secondary | ICD-10-CM | POA: Diagnosis not present

## 2018-07-27 NOTE — Progress Notes (Signed)
Primary nurse received a call from [facility] that pt had a bed on [place]; room nurber 3216. Primary nurse  paged and spoke to Dr. Luanna Cole. Orders received for discharge. Report was called and given to [place] at [facility] with all questions answered. Primary nurse paged and spoke to Dr. Luanna Cole when EMS arrived to update the [place] form. All paperwork was given to EMS and pt  with all questions answered. Pt escorted out via stretcher with EMS.

## 2018-07-27 NOTE — Progress Notes (Signed)
Date of Admission:  07/23/2018     ID: Darrell Santiago is a 53 y.o. male  Active Problems:   Pneumonia   Pancreatic pseudocyst rt pleural effusion Darrell Fuhrmann Holderis a 53 y.o.with a history of HTN, pancreatitis, Presents with increasing SOB and coughing up blood - pt says he has had rt sided chest pain and sob for more than 2 weeks.He last went to work on 07/13/18. He works in a [employer] Presented to [facility] on 3/30 with SOB, weakness , low grade fever . CXR showed a large rt pleural effusion. He also saw pulmonolgist Dr.Fleming at [facility] clinic the same day and was sent for stat pleurocentesis. He had 1200 ml of fluid removed by IR and sent for studies. Cell count was 928(50% N and 46% M) LD was 350, protein was 4 grams. Only AFB and fungal culture were sent and not aerobic bacterial culture. He came to the ED on 4/1 with worsening SOB but left AMA without getting a CT chest. He followed up with Dr.Fleming on 07/19/18 who thought whether the cause of the effusion an exudate was a possible Aspiration pneumonia. He was asked to complete augmentin which was prescribed on 07/16/18 He presented to the ED on 4/6 with worsening SOB, gave a h/o of Tmax of 100.4 PMH pancreatitis COPD HTN Left pleural effusion Smoker Alcohol use Peripheral neuropathy  Subjective: Doing well Cough much better No bloody sputum  Medications:  . nicotine  21 mg Transdermal Daily  . pantoprazole  40 mg Oral Daily  . pravastatin  20 mg Oral QHS  . sertraline  75 mg Oral q morning - 10a  . sodium chloride flush  3 mL Intravenous Q12H    Objective: Vital signs in last 24 hours: Temp:  [98.3 F (36.8 C)-101.8 F (38.8 C)] 98.3 F (36.8 C) (04/10 0900) Pulse Rate:  [99-117] 99 (04/10 0900) Resp:  [20-24] 20 (04/10 0900) BP: (106-122)/(62-81) 122/76 (04/10 0900) SpO2:  [92 %-96 %] 94 % (04/10 0900)  PHYSICAL EXAM:  General: Alert, cooperative, no distress, appears stated age.  Head: Normocephalic,  without obvious abnormality, atraumatic. Eyes: Conjunctivae clear, anicteric sclerae. Pupils are equal ENT Nares normal. No drainage or sinus tenderness. Lips, mucosa, and tongue normal. No Thrush Neck: Supple, symmetrical, no adenopathy, thyroid: non tender no carotid bruit and no JVD. Back: No CVA tenderness. Lungs: b.l air entry- decreased rt base  Heart: Regular rate and rhythm, no murmur, rub or gallop. Abdomen: Soft, distended. Bowel sounds normal. No masses Extremities: atraumatic, no cyanosis. No edema. No clubbing Skin: No rashes or lesions. Or bruising Lymph: Cervical, supraclavicular normal. Neurologic: Grossly non-focal  Lab Results Recent Labs    07/25/18 0433 07/26/18 0434  WBC 15.1* 14.2*  HGB 12.8* 12.6*  HCT 38.1* 38.3*  NA 134* 136  K 3.4* 3.3*  CL 98 101  CO2 23 24  BUN 9 14  CREATININE 0.54* 0.65   Liver Panel No results for input(s): PROT, ALBUMIN, AST, ALT, ALKPHOS, BILITOT, BILIDIR, IBILI in the last 72 hours. Sedimentation Rate No results for input(s): ESRSEDRATE in the last 72 hours. C-Reactive Protein Recent Labs    07/24/18 1555  CRP 11.4*    Microbiology:  Studies/Results:  Dg Chest Port 1 View  Result Date: 07/25/2018 CLINICAL DATA:  Post thoracentesis EXAM: PORTABLE CHEST 1 VIEW COMPARISON:  07/23/2018 FINDINGS: Right pleural effusion and right basilar pneumonia have improved. Left lung remains clear. Upper normal heart size. No pneumothorax. Unremarkable vascularity. IMPRESSION:  No pneumothorax post right thoracentesis. Electronically Signed   By: Marybelle Killings M.D.   On: 07/25/2018 11:28   US Thoracentesis Asp Pleural Space W/img Guide  Result Date: 07/25/2018 INDICATION: Right pleural effusion EXAM: ULTRASOUND GUIDED RIGHT THORACENTESIS MEDICATIONS: None. COMPLICATIONS: None immediate. PROCEDURE: An ultrasound guided thoracentesis was thoroughly discussed with the patient and questions answered. The benefits, risks, alternatives and  complications were also discussed. The patient understands and wishes to proceed with the procedure. Written consent was obtained. Ultrasound was performed to localize and mark an adequate pocket of fluid in the right chest. The area was then prepped and draped in the normal sterile fashion. 1% Lidocaine was used for local anesthesia. Under ultrasound guidance a 6 Fr Safe-T-Centesis catheter was introduced. Thoracentesis was performed. The catheter was removed and a dressing applied. FINDINGS: A total of approximately 700 cc of clear yellow fluid was removed. IMPRESSION: Successful ultrasound guided right thoracentesis yielding 700 cc of pleural fluid. Electronically Signed   By: Marybelle Killings M.D.   On: 07/25/2018 11:15     Assessment/Plan: Rt pleural effusion secondary to pancreatic pseudocyst , retroperitoneal collection tracking up- high amylase indicates it is pancreatic fluid. doing better after pleurocentesis   Rt middle lobe and lower lobe bronchopneumonia could be secondary to the large effusion- repeat CXR after pleurocentesis shows resolving consolidation.See film above  DC azithromycin= continue zosyn-more for the possible infected pseudocyst   Multiple rim enhancing collection around pancreas extending to rt pleural space- concern for infection - will treat with zosyn ( covers anerobes, enterococcus and gram neg) Pt could have pancreatic duct disruption / infected pseudocyst Seen by surgery - recommending transfer to [facility] for possible EUS drainage of fluid  Pancreatitis in NOV 2019 Still drinking 3 shots of whisky every night  Pt is being transferred to [facility] for further management

## 2018-07-27 NOTE — Discharge Summary (Signed)
[place] [facility] [place] NAME: Darrell Santiago    MR#:  [id number]  DATE OF BIRTH:  15-May-1965  DATE OF ADMISSION:  07/23/2018   ADMITTING PHYSICIAN: Saundra Shelling, MD  DATE OF DISCHARGE:  07/29/2018  PRIMARY CARE PHYSICIAN: McLean-Scocuzza, Nino Glow, MD   ADMISSION DIAGNOSIS:   Hematemesis with nausea [K92.0] Pneumonia of right lower lobe due to infectious organism ([place]) [J18.1] Sepsis, due to unspecified organism, unspecified whether acute organ dysfunction present ([place]) [A41.9]  DISCHARGE DIAGNOSIS:   Active Problems:   Pneumonia   Pancreatic pseudocyst   SECONDARY DIAGNOSIS:   Past Medical History:  Diagnosis Date  . Abnormal EKG   . Allergy    Seasonal  . Anxiety   . Chicken pox   . Depression   . GERD (gastroesophageal reflux disease)   . Hyperlipidemia   . Hypertension   . Urine incontinence     HOSPITAL COURSE:   53 year old male with past medical history significant for allergies, undiagnosed COPD/asthma not on home oxygen, GERD, hypertension presents to hospital secondary to worsening shortness of breath.  1.  Right-sided bronchopneumonia-patient had pleural effusion on the right side status post thoracentesis and 1.2 L removed prior to admission.  -Still had moderate pleural effusion on the right side.  Status post thoracentesis  on 07/25/2018 while as inpatient and sent for cultures.  Amylase is significantly elevated in the body fluid -Recurrent pleural effusion likely secondary to pancreatic pseudocyst draining through the diaphragm. - CT chest on admission showing significant right middle and lower lobe bronchopneumonia -Appreciate pulmonary consult.  Appreciate ID consult -Currently on antibiotics with Zosyn for intra-abdominal coverage.  MRSA PCR is negative, -Currently off oxygen. - COVID-19 test is negative -Hemoptysis is resolved at this time   2.  Complex pseudocyst in the abdomen-concern for complex  pancreatic pseudocyst which could be infected based on CT abdomen. -Patient has had history of pancreatitis in November 2019.  A small pseudocyst was present in the CT back then, now CT showing complex multiloculated rim-enhancing cyst extending all the way to the head and body of pancreas. -Also concern for infection given his fevers. -It is also tracking and causing recurrent right pleural effusion at this time. -General surgery has been consulted and patient would need surgery which cannot be done here, so recommended transfer to tertiary care center. -Discussed plan of care with [facility] -unable to arrange for outpatient ERCP at this time due to transportation issues.  Reached out to [facility] on-call hospitalist who were looking at transferring patient on Sunday to get the ERCP done on Monday and transfer him back to [facility]  3.  Alcohol use disorder-patient not going through any active withdrawals at this time.  Continue Ativan as needed  4.  Tobacco use disorder-likely has undiagnosed COPD.   on inhalers as outpatient.  Continue to follow-up with pulmonary as outpatient for PFTs. -On nicotine patch  5.  DVT prophylaxis-teds and SCDs only, given hemoptysis.   Patient will be transferred to [facility] to get ERCP and pseudocyst drainage as inpatient or outpatient   DISCHARGE CONDITIONS:   Guarded  CONSULTS OBTAINED:   Treatment Team:  Ottie Glazier, MD Olean Ree, MD  DRUG ALLERGIES:   No Known Allergies DISCHARGE MEDICATIONS:   Allergies as of 07/27/2018   No Known Allergies     Medication List    STOP taking these medications   Aleve 220 MG Caps Generic drug:  Naproxen Sodium   amoxicillin-clavulanate  500-125 MG tablet Commonly known as:  AUGMENTIN   aspirin 81 MG chewable tablet   losartan 50 MG tablet Commonly known as:  COZAAR   predniSONE 10 MG (21) Tbpk tablet Commonly known as:  STERAPRED UNI-PAK 21 TAB     TAKE these medications   acetaminophen 325 MG tablet  Commonly known as:  TYLENOL Take 2 tablets (650 mg total) by mouth every 6 (six) hours as needed for mild pain (or Fever >/= 101).   azithromycin 500 MG tablet Commonly known as:  ZITHROMAX Take 1 tablet (500 mg total) by mouth daily.   budesonide-formoterol 160-4.5 MCG/ACT inhaler Commonly known as:  Symbicort Inhale 2 puffs into the lungs 2 (two) times daily. Rinse mouth after use   ipratropium-albuterol 0.5-2.5 (3) MG/3ML Soln Commonly known as:  DUONEB Inhale 3 mLs into the lungs 4 (four) times daily.   LORazepam 0.5 MG tablet Commonly known as:  ATIVAN Take 1 tablet (0.5 mg total) by mouth daily as needed for anxiety.   multivitamin with minerals Tabs tablet Take 1 tablet by mouth daily.   nicotine 21 mg/24hr patch Commonly known as:  NICODERM CQ - dosed in mg/24 hours Place 1 patch (21 mg total) onto the skin daily.   OVER THE COUNTER MEDICATION Take 1 capsule by mouth daily.   pantoprazole 20 MG tablet Commonly known as:  PROTONIX Take 1 tablet (20 mg total) by mouth daily. 30 minutes before food   piperacillin-tazobactam 3.375 GM/50ML IVPB Commonly known as:  ZOSYN Inject 50 mLs (3.375 g total) into the vein every 8 (eight) hours.   pravastatin 20 MG tablet Commonly known as:  PRAVACHOL Take 1 tablet (20 mg total) by mouth at bedtime.   sertraline 50 MG tablet Commonly known as:  ZOLOFT Take 1.5 tablets (75 mg total) by mouth daily. In am   vitamin B-12 1000 MCG tablet Commonly known as:  CYANOCOBALAMIN Take 1,000 mcg by mouth daily.        DISCHARGE INSTRUCTIONS:   1. PCP f/u in 1-2 weeks 2. GI f/u as scheduled  DIET:   Cardiac diet  ACTIVITY:   Activity as tolerated  OXYGEN:   Home Oxygen: No.  Oxygen Delivery: room air  DISCHARGE LOCATION:   [facility] hospital   If you experience worsening of your admission symptoms, develop shortness of breath, life threatening emergency, suicidal or homicidal thoughts you must seek medical attention  immediately by calling 911 or calling your MD immediately  if symptoms less severe.  You Must read complete instructions/literature along with all the possible adverse reactions/side effects for all the Medicines you take and that have been prescribed to you. Take any new Medicines after you have completely understood and accpet all the possible adverse reactions/side effects.   Please note  You were cared for by a hospitalist during your hospital stay. If you have any questions about your discharge medications or the care you received while you were in the hospital after you are discharged, you can call the unit and asked to speak with the hospitalist on call if the hospitalist that took care of you is not available. Once you are discharged, your primary care physician will handle any further medical issues. Please note that NO REFILLS for any discharge medications will be authorized once you are discharged, as it is imperative that you return to your primary care physician (or establish a relationship with a primary care physician if you do not have one) for your aftercare needs so  that they can reassess your need for medications and monitor your lab values.    On the day of Discharge:  VITAL SIGNS:   Blood pressure 122/76, pulse 99, temperature 98.3 F (36.8 C), temperature source Oral, resp. rate 20, height 5\' 10"  (1.778 m), weight 85.1 kg, SpO2 94 %.  PHYSICAL EXAMINATION:    GENERAL:  53 y.o.-year-old patient lying in the bed with no acute distress.  EYES: Pupils equal, round, reactive to light and accommodation. No scleral icterus. Extraocular muscles intact.  HEENT: Head atraumatic, normocephalic. Oropharynx and nasopharynx clear.  Flushed face noted NECK:  Supple, no jugular venous distention. No thyroid enlargement, no tenderness.  LUNGS:no wheezing, rales,rhonchi or crepitation. No use of accessory muscles of respiration.  Significantly decreased right-sided breath sounds.  CARDIOVASCULAR: S1, S2 normal. No murmurs, rubs, or gallops.  ABDOMEN: Soft, tense, nontender,, nondistended. Bowel sounds present. No organomegaly or mass.  EXTREMITIES: No pedal edema, cyanosis, or clubbing.  NEUROLOGIC: Cranial nerves II through XII are intact. Muscle strength 5/5 in all extremities. Sensation intact. Gait not checked.  PSYCHIATRIC: The patient is alert and oriented x 3.  SKIN: No obvious rash, lesion, or ulcer.   DATA REVIEW:   CBC Recent Labs  Lab 07/26/18 0434  WBC 14.2*  HGB 12.6*  HCT 38.3*  PLT 224    Chemistries  Recent Labs  Lab 07/23/18 1826  07/26/18 0434  NA 139   < > 136  K 3.3*   < > 3.3*  CL 101   < > 101  CO2 25   < > 24  GLUCOSE 130*   < > 93  BUN 9   < > 14  CREATININE 0.45*   < > 0.65  CALCIUM 9.2   < > 8.8*  AST 71*  --   --   ALT 57*  --   --   ALKPHOS 165*  --   --   BILITOT 0.7  --   --    < > = values in this interval not displayed.     Microbiology Results  Results for orders placed or performed during the hospital encounter of 07/23/18  Blood Culture (routine x 2)     Status: None (Preliminary result)   Collection Time: 07/23/18  6:27 PM  Result Value Ref Range Status   Specimen Description BLOOD LEFT ANTECUBITAL  Final   Special Requests   Final    BOTTLES DRAWN AEROBIC AND ANAEROBIC Blood Culture adequate volume   Culture   Final    NO GROWTH 4 DAYS Performed at [facility], [address], [place], [place] [address]    Report Status PENDING  Incomplete  Blood Culture (routine x 2)     Status: None (Preliminary result)   Collection Time: 07/23/18  6:27 PM  Result Value Ref Range Status   Specimen Description BLOOD BLOOD LEFT FOREARM  Final   Special Requests   Final    BOTTLES DRAWN AEROBIC AND ANAEROBIC Blood Culture results may not be optimal due to an excessive volume of blood received in culture bottles   Culture   Final    NO GROWTH 4 DAYS Performed at [facility], [address] [place], [place] [address]    Report Status PENDING  Incomplete  Novel Coronavirus, NAA (hospital order; send-out to ref lab)     Status: None   Collection Time: 07/23/18  7:06 PM  Result Value Ref Range Status   SARS-CoV-2, NAA NOT DETECTED  NOT DETECTED Final    Comment: Negative (Not Detected) results do not exclude infection caused by SARS CoV 2 and should not be used as the sole basis for treatment or other patient management decisions. Optimum specimen types and timing for peak viral levels during infections caused  by SARS CoV 2 have not been determined. Collection of multiple specimens (types and time points) from the same patient may be necessary to detect the virus. Improper specimen collection and handling, sequence variability underlying assay primers and or probes, or the presence of organisms in  quantities less than the limit of detection of the assay may lead to false negative results. Positive and negative predictive values of testing are highly dependent on prevalence. False negative results are more likely when prevalence of disease is high. (NOTE) The expected result is Negative (Not Detected). The SARS CoV 2 test is intended for the presumptive qualitative  detection of nucleic acid from SARS CoV 2 in upper and lower  respir atory specimens. Testing methodology is real time RT PCR. Test results must be correlated with clinical presentation and  evaluated in the context of other laboratory and epidemiologic data.  Test performance can be affected because the epidemiology and  clinical spectrum of infection caused by SARS CoV 2 is not fully  known. For example, the optimum types of specimens to collect and  when during the course of infection these specimens are most likely  to contain detectable viral RNA may not be known. This test has not been Food and Drug Administration (FDA) cleared or  approved and has been authorized by FDA under an Emergency Use  Authorization  (EUA). The test is only authorized for the duration of  the declaration that circumstances exist justifying the authorization  of emergency use of in vitro diagnostic tests for detection and or  diagnosis of SARS CoV 2 under Section 564(b)(1) of the Act, 21 U.S.C.  section [phone] 3(b)(1), unless the authorization is terminated or   revoked sooner. [facility] is certified under the  Clinical Laboratory Improvement Amendments of 1988 (CLIA), 42 U.S.C.  section [phone], to perform high complexity tests. Performed at [facility] [id number] [address], [place], [place] [address] Laboratory Director: Loleta Books, MD Fact Sheet for Healthcare Providers  BankingDealers.co.za Fact Sheet for Patients  StrictlyIdeas.no Performed at [facility], [place] [address], [place], [place] [address]    Coronavirus Source NASOPHARYNGEAL  Final    Comment: Performed at [facility], [address] [place], [place] [address]  MRSA PCR Screening     Status: None   Collection Time: 07/23/18 11:25 PM  Result Value Ref Range Status   MRSA by PCR NEGATIVE NEGATIVE Final    Comment:        The GeneXpert MRSA Assay (FDA approved for NASAL specimens only), is one component of a comprehensive MRSA colonization surveillance program. It is not intended to diagnose MRSA infection nor to guide or monitor treatment for MRSA infections. Performed at [facility], [address] [place], [place] [address]   Respiratory Panel by PCR     Status: None   Collection Time: 07/24/18  4:23 PM  Result Value Ref Range Status   Adenovirus NOT DETECTED NOT DETECTED Final   Coronavirus 229E NOT DETECTED NOT DETECTED Final    Comment: (NOTE) The Coronavirus on the Respiratory Panel, DOES NOT test for the novel  Coronavirus (2019 nCoV)  Coronavirus HKU1 NOT DETECTED NOT DETECTED Final    Coronavirus NL63 NOT DETECTED NOT DETECTED Final   Coronavirus OC43 NOT DETECTED NOT DETECTED Final   Metapneumovirus NOT DETECTED NOT DETECTED Final   Rhinovirus / Enterovirus NOT DETECTED NOT DETECTED Final   Influenza A NOT DETECTED NOT DETECTED Final   Influenza B NOT DETECTED NOT DETECTED Final   Parainfluenza Virus 1 NOT DETECTED NOT DETECTED Final   Parainfluenza Virus 2 NOT DETECTED NOT DETECTED Final   Parainfluenza Virus 3 NOT DETECTED NOT DETECTED Final   Parainfluenza Virus 4 NOT DETECTED NOT DETECTED Final   Respiratory Syncytial Virus NOT DETECTED NOT DETECTED Final   Bordetella pertussis NOT DETECTED NOT DETECTED Final   Chlamydophila pneumoniae NOT DETECTED NOT DETECTED Final   Mycoplasma pneumoniae NOT DETECTED NOT DETECTED Final    Comment: Performed at [facility], [place] [address], [place], [place] [address]  Body fluid culture     Status: None (Preliminary result)   Collection Time: 07/25/18 11:11 AM  Result Value Ref Range Status   Specimen Description   Final    PLEURAL Performed at [facility], [address], [place], [place] [address]    Special Requests   Final    NONE Performed at [facility], [address] [place], [place] [address]    Gram Stain   Final    RARE WBC PRESENT,BOTH PMN AND MONONUCLEAR NO ORGANISMS SEEN    Culture   Final    NO GROWTH 2 DAYS Performed at [facility], [place] [address] [place], [place] [address]    Report Status PENDING  Incomplete  Fungus Culture With Stain     Status: None (Preliminary result)   Collection Time: 07/25/18 11:11 AM  Result Value Ref Range Status   Fungus Stain Final report  Final    Comment: (NOTE) Performed At: [facility] [address] [place], [place] [id number] Rush Farmer MD NU:[id number]    Fungus (Mycology) Culture PENDING  Incomplete   Fungal Source PLEURAL  Final    Comment: Performed at [facility], [address]  [place], [place] [address]  Fungus Culture Result     Status: None   Collection Time: 07/25/18 11:11 AM  Result Value Ref Range Status   Result 1 Comment  Final    Comment: (NOTE) KOH/Calcofluor preparation:  no fungus observed. Performed At: [facility] [place], [place] [id number] Rush Farmer MD EP:[id number]   Expectorated sputum assessment w rflx to resp cult     Status: None   Collection Time: 07/25/18  5:10 PM  Result Value Ref Range Status   Specimen Description EXPECTORATED SPUTUM  Final   Special Requests NONE  Final   Sputum evaluation   Final    THIS SPECIMEN IS ACCEPTABLE FOR SPUTUM CULTURE Performed at [facility], [address] [place], [place] [address]    Report Status 07/26/2018 FINAL  Final  Culture, respiratory     Status: None (Preliminary result)   Collection Time: 07/25/18  5:10 PM  Result Value Ref Range Status   Specimen Description   Final    EXPECTORATED SPUTUM Performed at [facility], [address] [place], [place] [address]    Special Requests   Final    NONE Reflexed from [id number] Performed at [facility], [address] [place], [place] [address]    Gram Stain   Final    MODERATE WBC PRESENT,BOTH PMN  AND MONONUCLEAR FEW GRAM POSITIVE COCCI FEW GRAM VARIABLE ROD FEW SQUAMOUS EPITHELIAL CELLS PRESENT    Culture   Final    CULTURE REINCUBATED FOR BETTER GROWTH Performed at [facility], [place] [address] [place], [place] [address]    Report Status PENDING  Incomplete    RADIOLOGY:  No results found.   Management plans discussed with the patient, family and they are in agreement.  CODE STATUS:     Code Status Orders  (From admission, onward)         Start     Ordered   07/23/18 2223  Full code  Continuous     07/23/18 2222        Code Status History    Date Active Date Inactive Code Status Order ID Comments User Context   03/02/2018 0322 03/03/2018 1839 Full Code  [id number]  Harrie Foreman, MD Inpatient      TOTAL TIME TAKING CARE OF THIS PATIENT: 38 minutes.    Gladstone Lighter M.D on 07/27/2018 at 2:15 PM  Between 7am to 6pm - Pager - [phone]  After 6pm go to [email or web address] - Proofreader  [facility] [place] [facility]  Office  [phone]  CC: Primary care physician; McLean-Scocuzza, Nino Glow, MD   Note: This dictation was prepared with Dragon dictation along with smaller phrase technology. Any transcriptional errors that result from this process are unintentional.

## 2018-07-27 NOTE — Progress Notes (Addendum)
Was not made aware pt would be having an ERCP today.  Informed pt he should be NPO and took liquids away from pt. Pt NPO from 4 AM on. Dorna Bloom RN Pt states last time he had anything to drink or eat was at midnight with him medication. Dorna Bloom RN

## 2018-07-27 NOTE — Progress Notes (Signed)
Telephone call to [facility] [phone] option [phone] spoke with Lilia Pro; advised that [facility] out-pt GI dept does not arrange out-pt transportation from their unit back to [facility]; advised her that I would notify Dr Tressia Miners and advised [facility] to cancel their call for transport to [facility] (currently on the floor) due to current status of no transportation available from [facility] to [facility]; Lilia Pro advised the procedure may need to be canceled; I advised her that I can not advise her of any cancellation will need to notify Dr Tressia Miners in order for the doctor to do this

## 2018-07-27 NOTE — Progress Notes (Signed)
Telephone call from [employer] Lennette Bihari) advised me that [facility] will not be able to transport pt from [facility] back to [facility] Room 126 when the pt is ready for transport; nor will they be able to stay and wait for the pt while the pt is having the procedure done

## 2018-07-27 NOTE — Progress Notes (Signed)
Patient was supposed to be transferred to [facility] for an outpatient GI procedure including EUS/ERCP and possible drainage of his infected pseudocyst.  However due to inability to arrange transportation to and from [facility], the procedure has been canceled. -I have reached out to [facility] for possible inpatient transfer and get the procedure done and get the patient back.  They are considering possible transfer for Sunday so that the procedure can be done by GI on Monday. -I spoke to Dr. Kerby Less, on-call hospitalist.  He has accepted the patient. -Discharge summary updated, and EMTALA form updated today

## 2018-07-27 NOTE — Progress Notes (Signed)
Telephone call to [facility] out-patient GI procedure [phone]; procedure area; EMS truck will be able to drop the pt off at [address] at the [facility] entrance and take him down to the basement to the GI Procedure are

## 2018-07-27 NOTE — Progress Notes (Signed)
Telephone call to [place] spoke with Lilia Pro; will need for [facility] to arrange transportation from their facility back to [place]; Lilia Pro to investigate and call [place] back with results if they are able to arrange transportation or not

## 2018-07-27 NOTE — Progress Notes (Signed)
Telephone call from Dr Tressia Miners; advised her that the pt can be transferred to [place] for procedure by [facility] however, [facility] will not be able to transport pt from [place] to [place] today due to limited trucks (no combine trucks due to holiday); after speaking with Lilia Pro at [place] they do not arrange EMS non emergency transportation from [facility] to [facility]; Dr Tressia Miners to investigate and advise

## 2018-07-27 NOTE — Progress Notes (Signed)
Telephone call to [facility] for nonemergency transport to [facility] for out-patient GI procedure [facility] and EUS); pt needs to be there by 10am for scheduled 1030am procedure time; obtained nonemergency telephone number of [phone] for [facility] to be able to call and arrange nonemergency transport when the pt is ready to be discharged/transferred back to [facility] Room 126.

## 2018-07-27 NOTE — Progress Notes (Signed)
[facility] [place] NAME: Darrell Santiago    MR#:  [id number]  DATE OF BIRTH:  Jul 15, 1965  SUBJECTIVE:  CHIEF COMPLAINT:   Chief Complaint  Patient presents with  . Shortness of Breath   -COVID-19 test is negative. -Still continues to be febrile, off oxygen.  Breathing is much improved.  Denies abdominal pain. -Patient to go to [facility] for outpatient EUS/ERCP and pancreatic pseudocyst drainage today.  REVIEW OF SYSTEMS:  Review of Systems  Constitutional: Positive for fever and malaise/fatigue. Negative for chills.  HENT: Negative for congestion, ear discharge, hearing loss and nosebleeds.   Eyes: Negative for blurred vision and double vision.  Respiratory: Positive for cough and shortness of breath. Negative for hemoptysis and wheezing.   Cardiovascular: Negative for chest pain, palpitations and leg swelling.  Gastrointestinal: Positive for nausea. Negative for abdominal pain, constipation, diarrhea and vomiting.  Genitourinary: Negative for dysuria.  Musculoskeletal: Positive for myalgias.  Neurological: Negative for dizziness, focal weakness, seizures, weakness and headaches.  Psychiatric/Behavioral: Negative for depression.    DRUG ALLERGIES:  No Known Allergies  VITALS:  Blood pressure 107/75, pulse (!) 110, temperature 99.8 F (37.7 C), temperature source Oral, resp. rate 20, height 5\' 10"  (1.778 m), weight 85.1 kg, SpO2 93 %.  PHYSICAL EXAMINATION:  Physical Exam   GENERAL:  53 y.o.-year-old patient lying in the bed with no acute distress.  EYES: Pupils equal, round, reactive to light and accommodation. No scleral icterus. Extraocular muscles intact.  HEENT: Head atraumatic, normocephalic. Oropharynx and nasopharynx clear.  Flushed face noted NECK:  Supple, no jugular venous distention. No thyroid enlargement, no tenderness.  LUNGS:no wheezing, rales,rhonchi or crepitation. No use of accessory muscles of respiration.   Significantly decreased right-sided breath sounds. CARDIOVASCULAR: S1, S2 normal. No murmurs, rubs, or gallops.  ABDOMEN: Soft, tense, nontender,, nondistended. Bowel sounds present. No organomegaly or mass.  EXTREMITIES: No pedal edema, cyanosis, or clubbing.  NEUROLOGIC: Cranial nerves II through XII are intact. Muscle strength 5/5 in all extremities. Sensation intact. Gait not checked.  PSYCHIATRIC: The patient is alert and oriented x 3.  SKIN: No obvious rash, lesion, or ulcer.    LABORATORY PANEL:   CBC Recent Labs  Lab 07/26/18 0434  WBC 14.2*  HGB 12.6*  HCT 38.3*  PLT 224   ------------------------------------------------------------------------------------------------------------------  Chemistries  Recent Labs  Lab 07/23/18 1826  07/26/18 0434  NA 139   < > 136  K 3.3*   < > 3.3*  CL 101   < > 101  CO2 25   < > 24  GLUCOSE 130*   < > 93  BUN 9   < > 14  CREATININE 0.45*   < > 0.65  CALCIUM 9.2   < > 8.8*  AST 71*  --   --   ALT 57*  --   --   ALKPHOS 165*  --   --   BILITOT 0.7  --   --    < > = values in this interval not displayed.   ------------------------------------------------------------------------------------------------------------------  Cardiac Enzymes Recent Labs  Lab 07/23/18 1826  TROPONINI <0.03   ------------------------------------------------------------------------------------------------------------------  RADIOLOGY:  Dg Chest Port 1 View  Result Date: 07/25/2018 CLINICAL DATA:  Post thoracentesis EXAM: PORTABLE CHEST 1 VIEW COMPARISON:  07/23/2018 FINDINGS: Right pleural effusion and right basilar pneumonia have improved. Left lung remains clear. Upper normal heart size. No pneumothorax. Unremarkable vascularity. IMPRESSION: No pneumothorax post right thoracentesis. Electronically Signed  By: Marybelle Killings M.D.   On: 07/25/2018 11:28   US Thoracentesis Asp Pleural Space W/img Guide  Result Date: 07/25/2018 INDICATION: Right  pleural effusion EXAM: ULTRASOUND GUIDED RIGHT THORACENTESIS MEDICATIONS: None. COMPLICATIONS: None immediate. PROCEDURE: An ultrasound guided thoracentesis was thoroughly discussed with the patient and questions answered. The benefits, risks, alternatives and complications were also discussed. The patient understands and wishes to proceed with the procedure. Written consent was obtained. Ultrasound was performed to localize and mark an adequate pocket of fluid in the right chest. The area was then prepped and draped in the normal sterile fashion. 1% Lidocaine was used for local anesthesia. Under ultrasound guidance a 6 Fr Safe-T-Centesis catheter was introduced. Thoracentesis was performed. The catheter was removed and a dressing applied. FINDINGS: A total of approximately 700 cc of clear yellow fluid was removed. IMPRESSION: Successful ultrasound guided right thoracentesis yielding 700 cc of pleural fluid. Electronically Signed   By: Marybelle Killings M.D.   On: 07/25/2018 11:15    EKG:   Orders placed or performed during the hospital encounter of 07/23/18  . ED EKG 12-Lead  . ED EKG 12-Lead    ASSESSMENT AND PLAN:   53 year old male with past medical history significant for allergies, undiagnosed COPD/asthma not on home oxygen, GERD, hypertension presents to hospital secondary to worsening shortness of breath.  1.  Right-sided bronchopneumonia-patient had pleural effusion on the right side status post thoracentesis and 1.2 L removed prior to admission.  -Still had moderate pleural effusion on the right side.  Status post thoracentesis  on 07/25/2018 while as inpatient and sent for cultures.  Amylase is significantly elevated in the body fluid -Recurrent pleural effusion likely secondary to pancreatic pseudocyst draining through the diaphragm. - CT chest on admission showing significant right middle and lower lobe bronchopneumonia -Appreciate pulmonary consult.  Appreciate ID consult -Currently on  antibiotics with Zosyn for intra-abdominal coverage.  MRSA PCR is negative, -Currently off oxygen. - COVID-19 test is negative -Hemoptysis is resolved at this time   2.  Complex pseudocyst in the abdomen-concern for complex pancreatic pseudocyst which could be infected based on CT abdomen. -Patient has had history of pancreatitis in November 2019.  A small pseudocyst was present in the CT back then, now CT showing complex multiloculated rim-enhancing cyst extending all the way to the head and body of pancreas. -Also concern for infection given his fevers. -It is also tracking and causing recurrent right pleural effusion at this time. -General surgery has been consulted and patient would need surgery which cannot be done here, so recommended transfer to tertiary care center. -Discussed plan of care with [facility] surgeon Dr. Zenia Resides, he has accepted to take the patient for outpatient ERCP today and patient will be discharged back to [facility] after the procedure.  3.  Alcohol use disorder-patient not going through any active withdrawals at this time.  Continue Ativan as needed  4.  Tobacco use disorder-likely has undiagnosed COPD.   on inhalers as outpatient.  Continue to follow-up with pulmonary as outpatient for PFTs. -On nicotine patch  5.  DVT prophylaxis-teds and SCDs only, given hemoptysis.   Patient will be transferred to [facility] to get ERCP and pseudocyst drainage as outpatient today and will be discharged back to our hospital today.  Patient and wife updated   All the records are reviewed and case discussed with Care Management/Social Workerr. Management plans discussed with the patient, family and they are in agreement.  CODE STATUS: Full code  TOTAL TIME TAKING CARE OF THIS PATIENT: 48 minutes.   POSSIBLE D/C IN 2 DAYS, DEPENDING ON CLINICAL CONDITION.   Gladstone Lighter M.D on 07/27/2018 at 8:50 AM  Between 7am to 6pm - Pager - [phone]  After 6pm go to [email or web address] -  password [email or web address] [facility]  Office  [phone]  CC: Primary care physician; McLean-Scocuzza, Nino Glow, MD

## 2018-07-28 DIAGNOSIS — J180 Bronchopneumonia, unspecified organism: Secondary | ICD-10-CM | POA: Diagnosis not present

## 2018-07-28 DIAGNOSIS — F17210 Nicotine dependence, cigarettes, uncomplicated: Secondary | ICD-10-CM | POA: Diagnosis not present

## 2018-07-28 DIAGNOSIS — J90 Pleural effusion, not elsewhere classified: Secondary | ICD-10-CM | POA: Diagnosis not present

## 2018-07-28 DIAGNOSIS — K863 Pseudocyst of pancreas: Secondary | ICD-10-CM | POA: Diagnosis not present

## 2018-07-28 LAB — CULTURE, BLOOD (ROUTINE X 2)
Culture: NO GROWTH
Culture: NO GROWTH
Special Requests: ADEQUATE

## 2018-07-28 LAB — BODY FLUID CULTURE: Culture: NO GROWTH

## 2018-07-28 LAB — CULTURE, RESPIRATORY

## 2018-07-28 LAB — CULTURE, RESPIRATORY W GRAM STAIN: Culture: NORMAL

## 2018-07-29 DIAGNOSIS — Z6825 Body mass index (BMI) 25.0-25.9, adult: Secondary | ICD-10-CM | POA: Diagnosis not present

## 2018-07-29 DIAGNOSIS — K863 Pseudocyst of pancreas: Secondary | ICD-10-CM | POA: Diagnosis not present

## 2018-07-29 LAB — CHOLESTEROL, BODY FLUID: Cholesterol, Fluid: 89 mg/dL

## 2018-07-30 DIAGNOSIS — F17210 Nicotine dependence, cigarettes, uncomplicated: Secondary | ICD-10-CM | POA: Diagnosis not present

## 2018-07-30 DIAGNOSIS — K8689 Other specified diseases of pancreas: Secondary | ICD-10-CM | POA: Diagnosis not present

## 2018-07-30 DIAGNOSIS — F1010 Alcohol abuse, uncomplicated: Secondary | ICD-10-CM | POA: Insufficient documentation

## 2018-07-30 DIAGNOSIS — K863 Pseudocyst of pancreas: Secondary | ICD-10-CM | POA: Diagnosis not present

## 2018-07-30 DIAGNOSIS — Z6825 Body mass index (BMI) 25.0-25.9, adult: Secondary | ICD-10-CM | POA: Diagnosis not present

## 2018-07-30 LAB — COMP PANEL: LEUKEMIA/LYMPHOMA

## 2018-07-30 MED ORDER — PANTOPRAZOLE SODIUM 20 MG PO TBEC
20.00 | DELAYED_RELEASE_TABLET | ORAL | Status: DC
Start: 2018-07-31 — End: 2018-07-30

## 2018-07-30 MED ORDER — LACTATED RINGERS IV SOLN
20.00 | INTRAVENOUS | Status: DC
Start: ? — End: 2018-07-30

## 2018-07-30 MED ORDER — NICOTINE 21 MG/24HR TD PT24
1.00 | MEDICATED_PATCH | TRANSDERMAL | Status: DC
Start: 2018-07-31 — End: 2018-07-30

## 2018-07-30 MED ORDER — ACETAMINOPHEN 325 MG PO TABS
650.00 | ORAL_TABLET | ORAL | Status: DC
Start: ? — End: 2018-07-30

## 2018-07-30 MED ORDER — GENERIC EXTERNAL MEDICATION
200.00 | Status: DC
Start: ? — End: 2018-07-30

## 2018-07-30 MED ORDER — FOLIC ACID 1 MG PO TABS
1.00 | ORAL_TABLET | ORAL | Status: DC
Start: 2018-07-31 — End: 2018-07-30

## 2018-07-30 MED ORDER — FLUTICASONE FUROATE-VILANTEROL 100-25 MCG/INH IN AEPB
1.00 | INHALATION_SPRAY | RESPIRATORY_TRACT | Status: DC
Start: 2018-07-31 — End: 2018-07-30

## 2018-07-30 MED ORDER — LORAZEPAM 0.5 MG PO TABS
.50 | ORAL_TABLET | ORAL | Status: DC
Start: ? — End: 2018-07-30

## 2018-07-30 MED ORDER — IPRATROPIUM-ALBUTEROL 0.5-2.5 (3) MG/3ML IN SOLN
3.00 | RESPIRATORY_TRACT | Status: DC
Start: ? — End: 2018-07-30

## 2018-07-30 MED ORDER — METRONIDAZOLE 500 MG PO TABS
500.00 | ORAL_TABLET | ORAL | Status: DC
Start: 2018-07-30 — End: 2018-07-30

## 2018-07-30 MED ORDER — PRAVASTATIN SODIUM 20 MG PO TABS
20.00 | ORAL_TABLET | ORAL | Status: DC
Start: 2018-07-30 — End: 2018-07-30

## 2018-07-30 MED ORDER — LEVOFLOXACIN 500 MG PO TABS
500.00 | ORAL_TABLET | ORAL | Status: DC
Start: 2018-07-31 — End: 2018-07-30

## 2018-07-30 MED ORDER — GENERIC EXTERNAL MEDICATION
75.00 | Status: DC
Start: 2018-07-31 — End: 2018-07-30

## 2018-07-30 MED ORDER — POLYETHYLENE GLYCOL 3350 17 G PO PACK
17.00 | PACK | ORAL | Status: DC
Start: ? — End: 2018-07-30

## 2018-07-30 MED ORDER — NICOTINE POLACRILEX 2 MG MT GUM
2.00 | CHEWING_GUM | OROMUCOSAL | Status: DC
Start: ? — End: 2018-07-30

## 2018-07-31 ENCOUNTER — Telehealth: Payer: Self-pay | Admitting: Internal Medicine

## 2018-07-31 NOTE — Telephone Encounter (Signed)
What does this mean? Not eligible for TCM   [place]

## 2018-07-31 NOTE — Telephone Encounter (Signed)
Pt was scheduled for a HFU on 04/17 @ 2:30 pm, pt was discharged yesterday from hospital. Please advise?

## 2018-07-31 NOTE — Telephone Encounter (Signed)
Patient is [employer] not eligible for TCM. Has HFU Scheduled.

## 2018-08-01 ENCOUNTER — Telehealth: Payer: Self-pay | Admitting: Internal Medicine

## 2018-08-01 DIAGNOSIS — Z0279 Encounter for issue of other medical certificate: Secondary | ICD-10-CM

## 2018-08-01 NOTE — Telephone Encounter (Signed)
Paper work for short term disabiltiy and [employer]. Papers are up front in color folder.

## 2018-08-01 NOTE — Telephone Encounter (Signed)
Patient has BCBS cannot charge extra code.

## 2018-08-02 ENCOUNTER — Encounter: Payer: Self-pay | Admitting: Internal Medicine

## 2018-08-02 DIAGNOSIS — J441 Chronic obstructive pulmonary disease with (acute) exacerbation: Secondary | ICD-10-CM | POA: Insufficient documentation

## 2018-08-02 DIAGNOSIS — J948 Other specified pleural conditions: Secondary | ICD-10-CM | POA: Insufficient documentation

## 2018-08-02 DIAGNOSIS — J918 Pleural effusion in other conditions classified elsewhere: Secondary | ICD-10-CM | POA: Insufficient documentation

## 2018-08-02 DIAGNOSIS — K869 Disease of pancreas, unspecified: Secondary | ICD-10-CM | POA: Insufficient documentation

## 2018-08-02 DIAGNOSIS — J449 Chronic obstructive pulmonary disease, unspecified: Secondary | ICD-10-CM | POA: Insufficient documentation

## 2018-08-02 DIAGNOSIS — J90 Pleural effusion, not elsewhere classified: Secondary | ICD-10-CM | POA: Insufficient documentation

## 2018-08-02 NOTE — Telephone Encounter (Signed)
Provider given paperwork.

## 2018-08-03 ENCOUNTER — Ambulatory Visit (INDEPENDENT_AMBULATORY_CARE_PROVIDER_SITE_OTHER): Payer: BLUE CROSS/BLUE SHIELD | Admitting: Internal Medicine

## 2018-08-03 DIAGNOSIS — K869 Disease of pancreas, unspecified: Secondary | ICD-10-CM

## 2018-08-03 DIAGNOSIS — I10 Essential (primary) hypertension: Secondary | ICD-10-CM

## 2018-08-03 DIAGNOSIS — D72829 Elevated white blood cell count, unspecified: Secondary | ICD-10-CM

## 2018-08-03 DIAGNOSIS — F17200 Nicotine dependence, unspecified, uncomplicated: Secondary | ICD-10-CM | POA: Diagnosis not present

## 2018-08-03 DIAGNOSIS — K863 Pseudocyst of pancreas: Secondary | ICD-10-CM

## 2018-08-03 DIAGNOSIS — J90 Pleural effusion, not elsewhere classified: Secondary | ICD-10-CM | POA: Diagnosis not present

## 2018-08-03 DIAGNOSIS — IMO0001 Reserved for inherently not codable concepts without codable children: Secondary | ICD-10-CM

## 2018-08-03 DIAGNOSIS — J449 Chronic obstructive pulmonary disease, unspecified: Secondary | ICD-10-CM | POA: Diagnosis not present

## 2018-08-03 DIAGNOSIS — J948 Other specified pleural conditions: Secondary | ICD-10-CM

## 2018-08-03 DIAGNOSIS — K861 Other chronic pancreatitis: Secondary | ICD-10-CM

## 2018-08-03 DIAGNOSIS — Z125 Encounter for screening for malignant neoplasm of prostate: Secondary | ICD-10-CM

## 2018-08-03 DIAGNOSIS — J189 Pneumonia, unspecified organism: Secondary | ICD-10-CM

## 2018-08-03 DIAGNOSIS — R946 Abnormal results of thyroid function studies: Secondary | ICD-10-CM

## 2018-08-03 DIAGNOSIS — J918 Pleural effusion in other conditions classified elsewhere: Secondary | ICD-10-CM

## 2018-08-03 DIAGNOSIS — Z1389 Encounter for screening for other disorder: Secondary | ICD-10-CM

## 2018-08-03 DIAGNOSIS — E876 Hypokalemia: Secondary | ICD-10-CM

## 2018-08-03 MED ORDER — LOSARTAN POTASSIUM 25 MG PO TABS: 25.0000 mg | ORAL_TABLET | Freq: Every day | ORAL | 3 refills | Status: DC

## 2018-08-03 NOTE — Addendum Note (Signed)
Addended by: Orland Mustard on: 08/03/2018 04:18 PM   Modules accepted: Orders

## 2018-08-03 NOTE — Progress Notes (Addendum)
Virtual Visit via Video Note via [facility] HFU  I connected with Darrell Santiago   on 08/03/18 at  2:40 PM EDT by a video enabled telemedicine application and verified that I am speaking with the correct person using two identifiers.  Location patient: home Location provider:work  Persons participating in the virtual visit: patient, provider, pts wife   I discussed the limitations of evaluation and management by telemedicine and the availability of in person appointments. The patient expressed understanding and agreed to proceed.   HPI: HFU 3/30-4/13/2020 for bronchopneumonia with sepsis, pleural effusion pancreatitic pseudocyst 2/2 chronic pancreatitis, COPD, pancreatico pleural fistual, hematemesis with nausea we was transferred to [facility] 4/10-4/13/2020 did paperwork 08/02/2018 to have him be out of work until 09/03/2018 he had a thoracentesis x 2 on 3/30 and 07/25/2018, ERCP with stent on 07/30/2018 [facility]  He will need outpatient pfts, pulmonary referral check thyroid labs. CT scan in 1 month and ERCP with pancreatitic stent removal with [facility] ~ 09/10/2018.   Disc etoh abuse he has not had a drink in 2 weeks and declines resources to stop drinking He is smoking <1 ppd now   CT chest sch 08/13/2018 at 3 pm     ROS: See pertinent positives and negatives per HPI. Denies fever, sweating, appetite ok  White states skin color better  Denies sob   Past Medical History:  Diagnosis Date  . Abnormal EKG   . Allergy    Seasonal  . Anxiety   . Chicken pox   . Depression   . GERD (gastroesophageal reflux disease)   . Hyperlipidemia   . Hypertension   . Urine incontinence     Past Surgical History:  Procedure Laterality Date  . CHOLECYSTECTOMY  2000  . LEFT HEART CATH AND CORONARY ANGIOGRAPHY N/A 01/31/2017   Procedure: LEFT HEART CATH AND CORONARY ANGIOGRAPHY;  Surgeon: Dionisio David, MD;  Location: [place] [facility];  Service: Cardiovascular;  Laterality: N/A;    Family History  Problem  Relation Age of Onset  . Alcohol abuse Father   . Diabetes Father   . Cancer Father        ?stage IV thyroid with met  . Cancer Maternal Grandfather        Colon Cancer    SOCIAL HX: lives at home with wife    Current Outpatient Medications:  .  acetaminophen (TYLENOL) 325 MG tablet, Take 2 tablets (650 mg total) by mouth every 6 (six) hours as needed for mild pain (or Fever >/= 101)., Disp: , Rfl:  .  budesonide-formoterol (SYMBICORT) 160-4.5 MCG/ACT inhaler, Inhale 2 puffs into the lungs 2 (two) times daily. Rinse mouth after use, Disp: 1 Inhaler, Rfl: 12 .  ipratropium-albuterol (DUONEB) 0.5-2.5 (3) MG/3ML SOLN, Inhale 3 mLs into the lungs 4 (four) times daily., Disp: , Rfl:  .  LORazepam (ATIVAN) 0.5 MG tablet, Take 1 tablet (0.5 mg total) by mouth daily as needed for anxiety., Disp: 30 tablet, Rfl: 2 .  losartan (COZAAR) 25 MG tablet, Take 1 tablet (25 mg total) by mouth daily., Disp: 90 tablet, Rfl: 3 .  Multiple Vitamin (MULTIVITAMIN WITH MINERALS) TABS tablet, Take 1 tablet by mouth daily., Disp: , Rfl:  .  nicotine (NICODERM CQ - DOSED IN MG/24 HOURS) 21 mg/24hr patch, Place 1 patch (21 mg total) onto the skin daily., Disp: 28 patch, Rfl: 0 .  OVER THE COUNTER MEDICATION, Take 1 capsule by mouth daily., Disp: , Rfl:  .  pantoprazole (PROTONIX)  20 MG tablet, Take 1 tablet (20 mg total) by mouth daily. 30 minutes before food, Disp: 90 tablet, Rfl: 3 .  piperacillin-tazobactam (ZOSYN) 3.375 GM/50ML IVPB, Inject 50 mLs (3.375 g total) into the vein every 8 (eight) hours., Disp: 50 mL, Rfl:  .  pravastatin (PRAVACHOL) 20 MG tablet, Take 1 tablet (20 mg total) by mouth at bedtime., Disp: 90 tablet, Rfl: 3 .  sertraline (ZOLOFT) 50 MG tablet, Take 1.5 tablets (75 mg total) by mouth daily. In am, Disp: 45 tablet, Rfl: 5 .  vitamin B-12 (CYANOCOBALAMIN) 1000 MCG tablet, Take 1,000 mcg by mouth daily. , Disp: , Rfl:   EXAM:  VITALS per patient if applicable:  GENERAL: alert, oriented,  appears well and in no acute distress  HEENT: atraumatic, conjunttiva clear, no obvious abnormalities on inspection of external nose and ears  NECK: normal movements of the head and neck  LUNGS: on inspection no signs of respiratory distress, breathing rate appears normal, no obvious gross SOB, gasping or wheezing  CV: no obvious cyanosis  MS: moves all visible extremities without noticeable abnormality  PSYCH/NEURO: pleasant and cooperative, no obvious depression or anxiety, speech and thought processing grossly intact  ASSESSMENT AND PLAN:  Discussed the following assessment and plan:  Smoking rec cessation refer to [facility] in future for pfts  Essential hypertension - Plan: losartan (COZAAR) 25 MG tablet reduce from 50 mg qd   Chronic obstructive pulmonary disease, unspecified COPD type (HCC)-contin. inhalers   Pleural effusion s/p thoracentesis x 2  Pancreatic pseudocyst s/p ERCP with stent given phone #s to f/u [facility] by 09/10/2018   Pneumonia of right lung due to infectious organism, unspecified part of lung tomorrow will finish levaquin and flagyl 5 day course after hosp.  On 08/04/2018  CT chest sch 08/13/2018 3 pm   Chronic pancreatitis, unspecified pancreatitis type (HCC)-rec etoh cessation he has stopped x 2 weeks and declines referral for AA/etoh help   (above complicated by)Pancreatic pleural effusion  Hypokalemia-mailed [facility] food list handout  HM Declines flu shot, pna 23  Tdap UTD  twinrix 2/3 today 3rd due 11/27/2018   rec smoking cessation pt has cut back  Disc colonscopy pt wants to think about it  PSA due 09/20/2018  Check fasting labs in future 11/2018     I discussed the assessment and treatment plan with the patient. The patient was provided an opportunity to ask questions and all were answered. The patient agreed with the plan and demonstrated an understanding of the instructions.   The patient was advised to call back or seek an in-person evaluation if  the symptoms worsen or if the condition fails to improve as anticipated.  Time spent 25 minutes  Delorise Jackson, MD

## 2018-08-10 ENCOUNTER — Telehealth: Payer: Self-pay

## 2018-08-10 NOTE — Telephone Encounter (Signed)
Copied from [place] [id number]. Topic: General - Call Back - No Documentation >> Aug 09, 2018  4:35 PM Sheran Luz wrote: Reason for CRM: Patient returning call to [place]. Requesting a call back.

## 2018-08-13 ENCOUNTER — Other Ambulatory Visit: Payer: Self-pay

## 2018-08-13 ENCOUNTER — Ambulatory Visit
Admission: RE | Admit: 2018-08-13 | Discharge: 2018-08-13 | Disposition: A | Discharge status: Home / Self Care | Payer: BLUE CROSS/BLUE SHIELD | Source: Ambulatory Visit | Attending: Specialist | Admitting: Specialist

## 2018-08-13 DIAGNOSIS — R0602 Shortness of breath: Secondary | ICD-10-CM

## 2018-08-15 LAB — FUNGUS CULTURE WITH STAIN

## 2018-08-15 LAB — FUNGAL ORGANISM REFLEX

## 2018-08-15 LAB — FUNGUS CULTURE RESULT

## 2018-08-20 ENCOUNTER — Encounter: Payer: Self-pay | Admitting: Internal Medicine

## 2018-08-21 ENCOUNTER — Ambulatory Visit (INDEPENDENT_AMBULATORY_CARE_PROVIDER_SITE_OTHER): Payer: BLUE CROSS/BLUE SHIELD | Admitting: Internal Medicine

## 2018-08-21 DIAGNOSIS — D72829 Elevated white blood cell count, unspecified: Secondary | ICD-10-CM

## 2018-08-21 DIAGNOSIS — Z720 Tobacco use: Secondary | ICD-10-CM

## 2018-08-21 DIAGNOSIS — J189 Pneumonia, unspecified organism: Secondary | ICD-10-CM

## 2018-08-21 DIAGNOSIS — J181 Lobar pneumonia, unspecified organism: Secondary | ICD-10-CM | POA: Diagnosis not present

## 2018-08-21 DIAGNOSIS — R918 Other nonspecific abnormal finding of lung field: Secondary | ICD-10-CM

## 2018-08-21 DIAGNOSIS — I2510 Atherosclerotic heart disease of native coronary artery without angina pectoris: Secondary | ICD-10-CM | POA: Insufficient documentation

## 2018-08-21 DIAGNOSIS — R7989 Other specified abnormal findings of blood chemistry: Secondary | ICD-10-CM

## 2018-08-21 DIAGNOSIS — R945 Abnormal results of liver function studies: Secondary | ICD-10-CM | POA: Diagnosis not present

## 2018-08-21 DIAGNOSIS — R5383 Other fatigue: Secondary | ICD-10-CM | POA: Diagnosis not present

## 2018-08-21 DIAGNOSIS — J449 Chronic obstructive pulmonary disease, unspecified: Secondary | ICD-10-CM

## 2018-08-21 DIAGNOSIS — R946 Abnormal results of thyroid function studies: Secondary | ICD-10-CM

## 2018-08-21 DIAGNOSIS — R911 Solitary pulmonary nodule: Secondary | ICD-10-CM | POA: Insufficient documentation

## 2018-08-21 DIAGNOSIS — I10 Essential (primary) hypertension: Secondary | ICD-10-CM

## 2018-08-21 MED ORDER — LEVOFLOXACIN 750 MG PO TABS: 750.0000 mg | ORAL_TABLET | Freq: Every day | ORAL | 0 refills | Status: DC

## 2018-08-21 NOTE — Progress Notes (Signed)
Virtual Visit via Video Note changed to telephone unable to hear 2/2 sound  I connected with Darrell Santiago and wife  on 08/21/18 at  3:30 PM EDT by a video enabled telemedicine application then telephone and verified that I am speaking with the correct person using two identifiers.  Location patient: home Location provider:work or home office Persons participating in the virtual visit: patient, provider, pts wife Darrell Santiago   I discussed the limitations of evaluation and management by telemedicine and the availability of in person appointments. The patient expressed understanding and agreed to proceed.   HPI: 1. S/p hosp d/c 09/2945 with complications pneumonia pancreatitic pseudocyst and complicated hospitalization still weak, fatigue and sob with exertion w/o fever.  Pt reports he has completed abx, cut back on etoh though still drinking and reduced smoking and increased water.   CT 08/13/2018 abnormal ordered by Dr. Raul Del (see results below)-reviewed with patient and wife and they were unaware    FINDINGS: Cardiovascular: Aortic atherosclerosis. Borderline cardiomegaly, without pericardial effusion. Proximal LAD and possible distal left main coronary artery calcification image 75/2.  Mediastinum/Nodes: No mediastinal or definite hilar adenopathy, given limitations of unenhanced CT.  Lungs/Pleura: Since the prior CT, resolution of right-sided pleural effusion.  Mild centrilobular emphysema.  Resolution of right greater than left airspace disease. A pleural-based residual opacity in the medial right lower lobe measures 1.3 cm on image 115/3. This is at the site of dense consolidation on the prior exam. New since the CT of the chest of 01/29/2017.  An underlying pattern of subtle ground-glass nodularity is relatively diffuse and similar to on the prior.  Upper Abdomen: Normal imaged portions of the liver, spleen, stomach, right adrenal gland, kidneys. A left adrenal myelolipoma  measures 1.2 cm.  Pancreatic duct stent with significant improvement in peripancreatic fluid and edema. Improvement in inflammatory process extending in the gastrohepatic ligament.  Musculoskeletal: Bilateral mild gynecomastia.  IMPRESSION: 1. Resolution of right-sided pleural effusion and multifocal pneumonia. 2. 1.3 cm nodular density in the right lower lobe, at the site of dense consolidation on the prior exam. Favored to represent residual infection. Neoplastic nodule cannot be excluded. Consider CT follow-up at 3 months. If the patient has ongoing infectious symptoms, consider interval antibiotics. 3. Underlying pattern of pulmonary ground-glass nodularity is similar and most likely represents respiratory bronchiolitis in a smoker. 4. Age advanced coronary artery atherosclerosis. Recommend assessment of coronary risk factors and consideration of medical therapy. 5.  Aortic Atherosclerosis (ICD10-I70.0). 6. Improved appearance of the pancreas and peripancreatic tissues, suboptimally evaluated.     ROS: See pertinent positives and negatives per HPI.  Past Medical History:  Diagnosis Date  . Abnormal EKG   . Allergy    Seasonal  . Anxiety   . Chicken pox   . Depression   . GERD (gastroesophageal reflux disease)   . Hyperlipidemia   . Hypertension   . Urine incontinence     Past Surgical History:  Procedure Laterality Date  . CHOLECYSTECTOMY  2000  . LEFT HEART CATH AND CORONARY ANGIOGRAPHY N/A 01/31/2017   Procedure: LEFT HEART CATH AND CORONARY ANGIOGRAPHY;  Surgeon: Dionisio David, MD;  Location: [place] [facility];  Service: Cardiovascular;  Laterality: N/A;    Family History  Problem Relation Age of Onset  . Alcohol abuse Father   . Diabetes Father   . Cancer Father        ?stage IV thyroid with met  . Cancer Maternal Grandfather  Colon Cancer    SOCIAL HX: married    Current Outpatient Medications:  .  acetaminophen (TYLENOL) 325  MG tablet, Take 2 tablets (650 mg total) by mouth every 6 (six) hours as needed for mild pain (or Fever >/= 101)., Disp: , Rfl:  .  budesonide-formoterol (SYMBICORT) 160-4.5 MCG/ACT inhaler, Inhale 2 puffs into the lungs 2 (two) times daily. Rinse mouth after use, Disp: 1 Inhaler, Rfl: 12 .  ipratropium-albuterol (DUONEB) 0.5-2.5 (3) MG/3ML SOLN, Inhale 3 mLs into the lungs 4 (four) times daily., Disp: , Rfl:  .  levofloxacin (LEVAQUIN) 750 MG tablet, Take 1 tablet (750 mg total) by mouth daily. With food, Disp: 7 tablet, Rfl: 0 .  LORazepam (ATIVAN) 0.5 MG tablet, Take 1 tablet (0.5 mg total) by mouth daily as needed for anxiety., Disp: 30 tablet, Rfl: 2 .  losartan (COZAAR) 25 MG tablet, Take 1 tablet (25 mg total) by mouth daily., Disp: 90 tablet, Rfl: 3 .  Multiple Vitamin (MULTIVITAMIN WITH MINERALS) TABS tablet, Take 1 tablet by mouth daily., Disp: , Rfl:  .  nicotine (NICODERM CQ - DOSED IN MG/24 HOURS) 21 mg/24hr patch, Place 1 patch (21 mg total) onto the skin daily., Disp: 28 patch, Rfl: 0 .  OVER THE COUNTER MEDICATION, Take 1 capsule by mouth daily., Disp: , Rfl:  .  pantoprazole (PROTONIX) 20 MG tablet, Take 1 tablet (20 mg total) by mouth daily. 30 minutes before food, Disp: 90 tablet, Rfl: 3 .  piperacillin-tazobactam (ZOSYN) 3.375 GM/50ML IVPB, Inject 50 mLs (3.375 g total) into the vein every 8 (eight) hours., Disp: 50 mL, Rfl:  .  pravastatin (PRAVACHOL) 20 MG tablet, Take 1 tablet (20 mg total) by mouth at bedtime., Disp: 90 tablet, Rfl: 3 .  sertraline (ZOLOFT) 50 MG tablet, Take 1.5 tablets (75 mg total) by mouth daily. In am, Disp: 45 tablet, Rfl: 5 .  vitamin B-12 (CYANOCOBALAMIN) 1000 MCG tablet, Take 1,000 mcg by mouth daily. , Disp: , Rfl:   EXAM: eval pt temp before converted to telephone visit   VITALS per patient if applicable:  GENERAL: alert, oriented, appears well and in no acute distress  HEENT: atraumatic, conjunttiva clear, no obvious abnormalities on  inspection of external nose and ears  NECK: normal movements of the head and neck  LUNGS: on inspection no signs of respiratory distress, breathing rate appears normal, no obvious gross SOB, gasping or wheezing, no accessory muscle use   CV: no obvious cyanosis  MS: moves all visible extremities without noticeable abnormality  PSYCH/NEURO: pleasant and cooperative, no obvious depression or anxiety, speech and thought processing grossly intact  ASSESSMENT AND PLAN:  Discussed the following assessment and plan:  Pneumonia of right lower lobe due to infectious organism [facility]) ?unresolved RLL/COPD, 1.3 CM right lung nodule/ground glass/tobacco abuse  Of note Abx course [facility] with prior hospitalization [facility] early 07/2018 with pleural effusion see HFU note 08/03/2018 Dr. Gayland Curry  -continued on Zosyn (4/8 - 07/30/2018 ): covering for both pneumonia and intra-abdominal infx and is s/p Azithromycin (4/7 - 4/9); cefepime (4/6 - 4/8); vancomycin (4/6 - 4/7) and s/p Augmentin (3/31 - 4/6) and has completed course of PNA. Sent home on levaquin 500 mg x 5 days 07/31/2018 and flagyl 500 mg tid x 5 days 07/31/2018    - Plan today 08/21/2018: levofloxacin (LEVAQUIN) 750 MG tablet, CBC w/Diff -reviewed CT see HPI  -levaquin 750 mg qd x 1 week  -rec smoking cessation pt has cut back  -refer to  [facility] he will need repeat CT scan of chest 11/12/2018 ordered and will need pfts  -extended FMLA paperwork until 09/17/2018    Fatigue, unspecified type with abnormal tfts hosp 07/2018 [facility] - Plan: Comprehensive metabolic panel, CBC w/Diff, TSH, T4, free, T3, free  Elevated liver function tests - Plan: Comprehensive metabolic panel -rec etoh cessation   Leukocytosis, unspecified type - Plan: CBC w/Diff  CAD w/o angina noted on CT refer [facility] cards   Pancreatic pseudocyst with stent and pancreatic pleural effusion  -effusion resolved stent removal via ERCP 09/11/2018 [facility]   HM  Declines flu shot, pna 23  Tdap  UTD twinrix 2/3 today3rd due 11/27/2018   rec smoking cessation pt has cut back  Disc colonscopy pt wants to think about it PSA due 09/20/2018   I discussed the assessment and treatment plan with the patient. The patient was provided an opportunity to ask questions and all were answered. The patient agreed with the plan and demonstrated an understanding of the instructions.   The patient was advised to call back or seek an in-person evaluation if the symptoms worsen or if the condition fails to improve as anticipated.  Time spent 25 minutes  Delorise Jackson, MD

## 2018-08-22 LAB — FUNGUS CULTURE WITH STAIN

## 2018-08-22 LAB — FUNGAL ORGANISM REFLEX

## 2018-08-22 LAB — FUNGUS CULTURE RESULT

## 2018-08-23 ENCOUNTER — Encounter: Payer: Self-pay | Admitting: Internal Medicine

## 2018-08-24 ENCOUNTER — Telehealth: Payer: Self-pay | Admitting: Cardiovascular Disease

## 2018-08-24 NOTE — Telephone Encounter (Signed)
Virtual Visit Pre-Appointment Phone Call  "(Name), I am calling you today to discuss your upcoming appointment. We are currently trying to limit exposure to the virus that causes COVID-19 by seeing patients at home rather than in the office."  1. "What is the BEST phone number to call the day of the visit?" - include this in appointment notes  2. Do you have or have access to (through a family member/friend) a smartphone with video capability that we can use for your visit?" a. If yes - list this number in appt notes as cell (if different from BEST phone #) and list the appointment type as a VIDEO visit in appointment notes b. If no - list the appointment type as a PHONE visit in appointment notes  3. Confirm consent - "In the setting of the current Covid19 crisis, you are scheduled for a (phone or video) visit with your provider on (date) at (time).  Just as we do with many in-office visits, in order for you to participate in this visit, we must obtain consent.  If you'd like, I can send this to your mychart (if signed up) or email for you to review.  Otherwise, I can obtain your verbal consent now.  All virtual visits are billed to your insurance company just like a normal visit would be.  By agreeing to a virtual visit, we'd like you to understand that the technology does not allow for your provider to perform an examination, and thus may limit your provider's ability to fully assess your condition. If your provider identifies any concerns that need to be evaluated in person, we will make arrangements to do so.  Finally, though the technology is pretty good, we cannot assure that it will always work on either your or our end, and in the setting of a video visit, we may have to convert it to a phone-only visit.  In either situation, we cannot ensure that we have a secure connection.  Are you willing to proceed?" STAFF: Did the patient verbally acknowledge consent to telehealth visit? Document  YES/NO here: YES  4. Advise patient to be prepared - "Two hours prior to your appointment, go ahead and check your blood pressure, pulse, oxygen saturation, and your weight (if you have the equipment to check those) and write them all down. When your visit starts, your provider will ask you for this information. If you have an Apple Watch or Kardia device, please plan to have heart rate information ready on the day of your appointment. Please have a pen and paper handy nearby the day of the visit as well."  5. Give patient instructions for MyChart download to smartphone OR Doximity/Doxy.me as below if video visit (depending on what platform provider is using)  6. Inform patient they will receive a phone call 15 minutes prior to their appointment time (may be from unknown caller ID) so they should be prepared to answer    TELEPHONE CALL NOTE  Darrell Santiago has been deemed a candidate for a follow-up tele-health visit to limit community exposure during the Covid-19 pandemic. I spoke with the patient via phone to ensure availability of phone/video source, confirm preferred email & phone number, and discuss instructions and expectations.  I reminded Darrell Santiago to be prepared with any vital sign and/or heart rhythm information that could potentially be obtained via home monitoring, at the time of his visit. I reminded Darrell Santiago to expect a phone call prior to  his visit.  Darrell Santiago 08/24/2018 4:04 PM   INSTRUCTIONS FOR DOWNLOADING THE MYCHART APP TO SMARTPHONE  - The patient must first make sure to have activated MyChart and know their login information - If Apple, go to [employer] and type in MyChart in the search bar and download the app. If Android, ask patient to go to [employer] and type in Nord in the search bar and download the app. The app is free but as with any other app downloads, their phone may require them to verify saved payment information or Apple/Android  password.  - The patient will need to then log into the app with their MyChart username and password, and select [facility] as their healthcare provider to link the account. When it is time for your visit, go to the MyChart app, find appointments, and click Begin Video Visit. Be sure to Select Allow for your device to access the Microphone and Camera for your visit. You will then be connected, and your provider will be with you shortly.  **If they have any issues connecting, or need assistance please contact MyChart service desk [phone])**  **If using a computer, in order to ensure the best quality for their visit they will need to use either of the following Internet Browsers: [employer], or Google Chrome**  IF USING DOXIMITY or DOXY.ME - The patient will receive a link just prior to their visit by text.     FULL LENGTH CONSENT FOR TELE-HEALTH VISIT   I hereby voluntarily request, consent and authorize [facility] and its employed or contracted physicians, physician assistants, nurse practitioners or other licensed health care professionals (the Practitioner), to provide me with telemedicine health care services (the Services") as deemed necessary by the treating Practitioner. I acknowledge and consent to receive the Services by the Practitioner via telemedicine. I understand that the telemedicine visit will involve communicating with the Practitioner through live audiovisual communication technology and the disclosure of certain medical information by electronic transmission. I acknowledge that I have been given the opportunity to request an in-person assessment or other available alternative prior to the telemedicine visit and am voluntarily participating in the telemedicine visit.  I understand that I have the right to withhold or withdraw my consent to the use of telemedicine in the course of my care at any time, without affecting my right to future care or treatment,  and that the Practitioner or I may terminate the telemedicine visit at any time. I understand that I have the right to inspect all information obtained and/or recorded in the course of the telemedicine visit and may receive copies of available information for a reasonable fee.  I understand that some of the potential risks of receiving the Services via telemedicine include:   Delay or interruption in medical evaluation due to technological equipment failure or disruption;  Information transmitted may not be sufficient (e.g. poor resolution of images) to allow for appropriate medical decision making by the Practitioner; and/or   In rare instances, security protocols could fail, causing a breach of personal health information.  Furthermore, I acknowledge that it is my responsibility to provide information about my medical history, conditions and care that is complete and accurate to the best of my ability. I acknowledge that Practitioner's advice, recommendations, and/or decision may be based on factors not within their control, such as incomplete or inaccurate data provided by me or distortions of diagnostic images or specimens that may result from electronic transmissions. I understand  that the practice of medicine is not an exact science and that Practitioner makes no warranties or guarantees regarding treatment outcomes. I acknowledge that I will receive a copy of this consent concurrently upon execution via email to the email address I last provided but may also request a printed copy by calling the office of [facility].    I understand that my insurance will be billed for this visit.   I have read or had this consent read to me.  I understand the contents of this consent, which adequately explains the benefits and risks of the Services being provided via telemedicine.   I have been provided ample opportunity to ask questions regarding this consent and the Services and have had my questions  answered to my satisfaction.  I give my informed consent for the services to be provided through the use of telemedicine in my medical care  By participating in this telemedicine visit I agree to the above.

## 2018-08-27 ENCOUNTER — Other Ambulatory Visit: Payer: Self-pay

## 2018-08-27 ENCOUNTER — Other Ambulatory Visit (INDEPENDENT_AMBULATORY_CARE_PROVIDER_SITE_OTHER): Payer: BLUE CROSS/BLUE SHIELD

## 2018-08-27 ENCOUNTER — Telehealth: Payer: Self-pay | Admitting: Internal Medicine

## 2018-08-27 DIAGNOSIS — R5383 Other fatigue: Secondary | ICD-10-CM

## 2018-08-27 DIAGNOSIS — E876 Hypokalemia: Secondary | ICD-10-CM | POA: Diagnosis not present

## 2018-08-27 DIAGNOSIS — D72829 Elevated white blood cell count, unspecified: Secondary | ICD-10-CM | POA: Diagnosis not present

## 2018-08-27 DIAGNOSIS — R7989 Other specified abnormal findings of blood chemistry: Secondary | ICD-10-CM

## 2018-08-27 DIAGNOSIS — R945 Abnormal results of liver function studies: Secondary | ICD-10-CM

## 2018-08-27 DIAGNOSIS — Z125 Encounter for screening for malignant neoplasm of prostate: Secondary | ICD-10-CM | POA: Diagnosis not present

## 2018-08-27 DIAGNOSIS — Z1389 Encounter for screening for other disorder: Secondary | ICD-10-CM | POA: Diagnosis not present

## 2018-08-27 DIAGNOSIS — J189 Pneumonia, unspecified organism: Secondary | ICD-10-CM

## 2018-08-27 DIAGNOSIS — R946 Abnormal results of thyroid function studies: Secondary | ICD-10-CM | POA: Diagnosis not present

## 2018-08-27 DIAGNOSIS — J181 Lobar pneumonia, unspecified organism: Secondary | ICD-10-CM | POA: Diagnosis not present

## 2018-08-27 LAB — CBC WITH DIFFERENTIAL/PLATELET
Basophils Absolute: 0 10*3/uL (ref 0.0–0.1)
Basophils Relative: 0.9 % (ref 0.0–3.0)
Eosinophils Absolute: 0.1 10*3/uL (ref 0.0–0.7)
Eosinophils Relative: 2.5 % (ref 0.0–5.0)
HCT: 45.8 % (ref 39.0–52.0)
Hemoglobin: 15.6 g/dL (ref 13.0–17.0)
Lymphocytes Relative: 28.1 % (ref 12.0–46.0)
Lymphs Abs: 1.2 10*3/uL (ref 0.7–4.0)
MCHC: 34 g/dL (ref 30.0–36.0)
MCV: 104.1 fl — ABNORMAL HIGH (ref 78.0–100.0)
Monocytes Absolute: 0.3 10*3/uL (ref 0.1–1.0)
Monocytes Relative: 8.1 % (ref 3.0–12.0)
Neutro Abs: 2.6 10*3/uL (ref 1.4–7.7)
Neutrophils Relative %: 60.4 % (ref 43.0–77.0)
Platelets: 114 10*3/uL — ABNORMAL LOW (ref 150.0–400.0)
RBC: 4.4 Mil/uL (ref 4.22–5.81)
RDW: 16.6 % — ABNORMAL HIGH (ref 11.5–15.5)
WBC: 4.3 10*3/uL (ref 4.0–10.5)

## 2018-08-27 LAB — COMPREHENSIVE METABOLIC PANEL
ALT: 20 U/L (ref 0–53)
AST: 46 U/L — ABNORMAL HIGH (ref 0–37)
Albumin: 4.2 g/dL (ref 3.5–5.2)
Alkaline Phosphatase: 133 U/L — ABNORMAL HIGH (ref 39–117)
BUN: 10 mg/dL (ref 6–23)
CO2: 29 mEq/L (ref 19–32)
Calcium: 9.2 mg/dL (ref 8.4–10.5)
Chloride: 105 mEq/L (ref 96–112)
Creatinine, Ser: 0.67 mg/dL (ref 0.40–1.50)
GFR: 125.06 mL/min (ref >60.00)
Glucose, Bld: 138 mg/dL — ABNORMAL HIGH (ref 70–99)
Potassium: 3.2 mEq/L — ABNORMAL LOW (ref 3.5–5.1)
Sodium: 146 mEq/L — ABNORMAL HIGH (ref 135–145)
Total Bilirubin: 1.2 mg/dL (ref 0.2–1.2)
Total Protein: 6.8 g/dL (ref 6.0–8.3)

## 2018-08-27 LAB — T3, FREE: T3, Free: 3.3 pg/mL (ref 2.3–4.2)

## 2018-08-27 LAB — TSH: TSH: 0.9 u[IU]/mL (ref 0.35–4.50)

## 2018-08-27 LAB — PSA: PSA: 0.8 ng/mL (ref 0.10–4.00)

## 2018-08-27 LAB — T4, FREE: Free T4: 0.74 ng/dL (ref 0.60–1.60)

## 2018-08-27 LAB — MAGNESIUM: Magnesium: 1.4 mg/dL — ABNORMAL LOW (ref 1.5–2.5)

## 2018-08-27 NOTE — Telephone Encounter (Signed)
Paperwork placed on provider desk

## 2018-08-27 NOTE — Telephone Encounter (Signed)
Pt drop off paper work (for Disability) to be filled out. Paper work is in Probation officer upfront. Thank you!

## 2018-08-28 ENCOUNTER — Encounter: Payer: Self-pay | Admitting: Internal Medicine

## 2018-08-28 ENCOUNTER — Institutional Professional Consult (permissible substitution): Payer: BLUE CROSS/BLUE SHIELD | Admitting: Internal Medicine

## 2018-08-28 ENCOUNTER — Ambulatory Visit (INDEPENDENT_AMBULATORY_CARE_PROVIDER_SITE_OTHER): Payer: BLUE CROSS/BLUE SHIELD | Admitting: Internal Medicine

## 2018-08-28 ENCOUNTER — Other Ambulatory Visit: Payer: Self-pay

## 2018-08-28 VITALS — BP 122/86 | HR 87 | Ht 69.0 in | Wt 186.2 lb

## 2018-08-28 DIAGNOSIS — F17210 Nicotine dependence, cigarettes, uncomplicated: Secondary | ICD-10-CM

## 2018-08-28 DIAGNOSIS — J181 Lobar pneumonia, unspecified organism: Secondary | ICD-10-CM

## 2018-08-28 DIAGNOSIS — R911 Solitary pulmonary nodule: Secondary | ICD-10-CM | POA: Diagnosis not present

## 2018-08-28 DIAGNOSIS — R918 Other nonspecific abnormal finding of lung field: Secondary | ICD-10-CM

## 2018-08-28 LAB — URINALYSIS, ROUTINE W REFLEX MICROSCOPIC
Bacteria, UA: NONE SEEN /HPF
Bilirubin Urine: NEGATIVE
Glucose, UA: NEGATIVE
Hgb urine dipstick: NEGATIVE
Hyaline Cast: NONE SEEN /LPF
Ketones, ur: NEGATIVE
Nitrite: NEGATIVE
RBC / HPF: NONE SEEN /HPF (ref 0–2)
Specific Gravity, Urine: 1.022 (ref 1.001–1.03)
Squamous Epithelial / HPF: NONE SEEN /HPF (ref <=5)
WBC, UA: NONE SEEN /HPF (ref 0–5)
pH: 6.5 (ref 5.0–8.0)

## 2018-08-28 NOTE — Patient Instructions (Addendum)
--  Continue using symbicort inhaler 2 puffs twice daily and rinse mouth after use.  --Use nebulizer twice per day until you feel better.  --Your recent CT scan shows that your pneumonia is completely resolved, you may have some residual symptoms of fatigue for several more weeks.  --Recommend that you keep pets out of the bedroom.  --You have a small nodule in the lung, which is likely left over from the recent pneumonia. We will recheck a CT chest in about 3 months to make sure that it is gone.   --Quitting smoking and stopping drinking are the most important things that you can do for your health. Recommend that you call the [place] line which can help you quit smoking. [phone].

## 2018-08-28 NOTE — Progress Notes (Signed)
[facility] Consultation      Assessment and Plan:  Pneumonia and pleural effusion. - Recent hospital admission for pneumonia, status post drainage of right-sided pleural effusion.  Now appears resolved, was likely related to underlying pancreatic pseudocyst. - Patient was given reassurance that fatigue that was due to the pneumonia will take a few more weeks for him to recover from.  Lung nodule. - Small 13 mm right lower lobe lung nodule, was not seen on previous abdominal CT from November 2019, may be related to recent pneumonia. - Repeat CT chest in 3 months time.  COPD/emphysema with dyspnea on exertion. - Patient is currently on Symbicort twice daily, I advised him to use his nebulizer twice daily in addition to this.  Alcohol and nicotine abuse. - Smoking 1 pack/day, doing 2 shots of whiskey 3 nights per week, discussed cessation of both, spent more than 5 minutes in discussion.  He wants to quit smoking on his own, I gave him the number for the  quit line.    Date: 08/28/2018  MRN# [id number] Darrell Santiago Feb 18, 1966   Darrell Santiago is a 53 y.o. old male seen in consultation for chief complaint of:    Chief Complaint  Patient presents with  . Consult    Referred by Karlyn Agee for right lower lobe pneumonia, about 4 weeks duration, still short of breath all the time. wheezing every now and then, okay after breathing treatment, cold all the time, lack of energy, shaking     HPI:   The patient is a 53 year old male with a history of pancreatitis in November 2019.  He was seen in the hospital from 4/6 to 07/29/2018, at that time noted to have a complex, possibly infected pseudocyst.  He was also noted to haveRight-sided bronchopneumonia-patient had pleural effusion on the right side status post thoracentesis and 1.2 L removedprior to admission.  Patient is present today with persistent complaints of shortness of breath. the patient is noted  to have minimal to no energy and he is breathing and energy give out easily.  He is using symbicort 2 puffs twice per day, he feels that it helps somewhat. He also uses a nebulizer a few times per week, helps only a little bit. He notes a bit of a smokers cough, no hemoptysis other than when he was in the hospital.  He does not feel that he is improving, and continues to have those symptoms.  He appears to still be weak from the recent hospitalization and he is cold much of the time.   He has a dog at home, in the bedroom.  He denies reflux.  He takes lorazepam less than once per week.  He is not sleepy during the day, he snores but does not take naps during the day.   He is currently a smoker of about 1 pack/day. He has been smoking for about 26 years. He drinks "a couple shots" 3 times per week, down from daily. Shots are of whisky usually around bedtime.   **CT chest 08/13/2018>> imaging personally reviewed, there is a 13 mm nodule in the medial segment of the right lower lobe.  This was not seen on devious CT abdomen on 03/01/2018.  there is mild air trapping suggestive of COPD.  Lungs are otherwise unremarkable.  There does not appear to be any residual pneumonia or pleural effusion.   PMHX:   Past Medical History:  Diagnosis Date  . Abnormal EKG   .  Allergy    Seasonal  . Anxiety   . Chicken pox   . Depression   . GERD (gastroesophageal reflux disease)   . Hyperlipidemia   . Hypertension   . Urine incontinence    Surgical Hx:  Past Surgical History:  Procedure Laterality Date  . CHOLECYSTECTOMY  2000  . LEFT HEART CATH AND CORONARY ANGIOGRAPHY N/A 01/31/2017   Procedure: LEFT HEART CATH AND CORONARY ANGIOGRAPHY;  Surgeon: Dionisio David, MD;  Location: [facility];  Service: Cardiovascular;  Laterality: N/A;   Family Hx:  Family History  Problem Relation Age of Onset  . Alcohol abuse Father   . Diabetes Father   . Cancer Father        ?stage IV thyroid with met   . Cancer Maternal Grandfather        Colon Cancer   Social Hx:   Social History   Tobacco Use  . Smoking status: Current Every Day Smoker    Packs/day: 1.00    Years: 26.00    Pack years: 26.00    Types: Cigarettes  . Smokeless tobacco: Never Used  . Tobacco comment: little less than one ppd  Substance Use Topics  . Alcohol use: Yes    Alcohol/week: 21.0 standard drinks    Types: 21 Shots of liquor per week    Comment: 1 pt will last 1 week   . Drug use: No   Medication:    Current Outpatient Medications:  .  acetaminophen (TYLENOL) 325 MG tablet, Take 2 tablets (650 mg total) by mouth every 6 (six) hours as needed for mild pain (or Fever >/= 101)., Disp: , Rfl:  .  budesonide-formoterol (SYMBICORT) 160-4.5 MCG/ACT inhaler, Inhale 2 puffs into the lungs 2 (two) times daily. Rinse mouth after use, Disp: 1 Inhaler, Rfl: 12 .  ipratropium-albuterol (DUONEB) 0.5-2.5 (3) MG/3ML SOLN, Inhale 3 mLs into the lungs 4 (four) times daily., Disp: , Rfl:  .  levofloxacin (LEVAQUIN) 750 MG tablet, Take 1 tablet (750 mg total) by mouth daily. With food, Disp: 7 tablet, Rfl: 0 .  LORazepam (ATIVAN) 0.5 MG tablet, Take 1 tablet (0.5 mg total) by mouth daily as needed for anxiety., Disp: 30 tablet, Rfl: 2 .  losartan (COZAAR) 25 MG tablet, Take 1 tablet (25 mg total) by mouth daily., Disp: 90 tablet, Rfl: 3 .  Multiple Vitamin (MULTIVITAMIN WITH MINERALS) TABS tablet, Take 1 tablet by mouth daily., Disp: , Rfl:  .  OVER THE COUNTER MEDICATION, Take 1 capsule by mouth daily., Disp: , Rfl:  .  pantoprazole (PROTONIX) 20 MG tablet, Take 1 tablet (20 mg total) by mouth daily. 30 minutes before food, Disp: 90 tablet, Rfl: 3 .  pravastatin (PRAVACHOL) 20 MG tablet, Take 1 tablet (20 mg total) by mouth at bedtime., Disp: 90 tablet, Rfl: 3 .  sertraline (ZOLOFT) 50 MG tablet, Take 1.5 tablets (75 mg total) by mouth daily. In am, Disp: 45 tablet, Rfl: 5 .  vitamin B-12 (CYANOCOBALAMIN) 1000 MCG tablet, Take  1,000 mcg by mouth daily. , Disp: , Rfl:    Allergies:  Patient has no known allergies.  Review of Systems: Gen:  Denies  fever, sweats, chills HEENT: Denies blurred vision, double vision. bleeds, sore throat Cvc:  No dizziness, chest pain. Resp:   Denies cough or sputum production, shortness of breath Gi: Denies swallowing difficulty, stomach pain. Gu:  Denies bladder incontinence, burning urine Ext:   No Joint pain, stiffness. Skin: No skin rash,  hives  Endoc:  No polyuria, polydipsia. Psych: No depression, insomnia. Other:  All other systems were reviewed with the patient and were negative other that what is mentioned in the HPI.   Physical Examination:   VS: BP 122/86 (BP Location: Left Arm, Cuff Size: Normal)   Pulse 87   Ht '5\' 9"'$  (1.753 m)   Wt 186 lb 3.2 oz (84.5 kg)   SpO2 97%   BMI 27.50 kg/m   General Appearance: No distress  Neuro:without focal findings,  speech normal,  HEENT: PERRLA, EOM intact.   Pulmonary: normal breath sounds, No wheezing.  CardiovascularNormal S1,S2.  No m/r/g.   Abdomen: Benign, Soft, non-tender. Renal:  No costovertebral tenderness  GU:  No performed at this time. Endoc: No evident thyromegaly, no signs of acromegaly. Skin:   warm, no rashes, no ecchymosis  Extremities: normal, no cyanosis, clubbing.  Other findings:    LABORATORY PANEL:   CBC Recent Labs  Lab 08/27/18 0908  WBC 4.3  HGB 15.6  HCT 45.8  PLT 114.0*   ------------------------------------------------------------------------------------------------------------------  Chemistries  Recent Labs  Lab 08/27/18 0908  NA 146*  K 3.2*  CL 105  CO2 29  GLUCOSE 138*  BUN 10  CREATININE 0.67  CALCIUM 9.2  MG 1.4*  AST 46*  ALT 20  ALKPHOS 133*  BILITOT 1.2   ------------------------------------------------------------------------------------------------------------------  Cardiac Enzymes No results for input(s): TROPONINI in the last 168 hours.  ------------------------------------------------------------  RADIOLOGY:  No results found.     Thank  you for the consultation and for allowing [facility] to assist in the care of your patient. Our recommendations are noted above.  Please contact us if we can be of further service.   Marda Stalker, M.D., F.C.C.P.  Board Certified in Internal Medicine, Pulmonary Medicine, [place], and Sleep Medicine.  [facility] Office Number: [phone]   08/28/2018

## 2018-08-29 LAB — ACID FAST CULTURE WITH REFLEXED SENSITIVITIES (MYCOBACTERIA): Acid Fast Culture: NEGATIVE

## 2018-09-02 NOTE — Progress Notes (Signed)
Virtual Visit via Video Note   This visit type was conducted due to national recommendations for restrictions regarding the COVID-19 Pandemic (e.g. social distancing) in an effort to limit this patient's exposure and mitigate transmission in our community.  Due to his co-morbid illnesses, this patient is at least at moderate risk for complications without adequate follow up.  This format is felt to be most appropriate for this patient at this time.  All issues noted in this document were discussed and addressed.  A limited physical exam was performed with this format.  Please refer to the patient's chart for his consent to telehealth for [facility].   I connected with  Darrell Santiago on 09/03/18 by a video enabled telemedicine application and verified that I am speaking with the correct person using two identifiers. I discussed the limitations of evaluation and management by telemedicine. The patient expressed understanding and agreed to proceed.   Evaluation Performed:  Follow-up visit  Date:  09/03/2018   ID:  Darrell Santiago, DOB May 30, 1965, MRN [id number]  Patient Location:  [place] [address]   Provider location:   [place], [place] office  PCP:  McLean-Scocuzza, Nino Glow, MD  Cardiologist:  Patsy Baltimore   Chief Complaint: Coronary artery disease seen on CT scan  New Patient  History of Present Illness:    Darrell Santiago is a 53 y.o. male who presents via audio/video conferencing for a telehealth visit today.   The patient does not symptoms concerning for COVID-19 infection (fever, chills, cough, or new SHORTNESS OF BREATH).   Patient has a past medical history of HTN ETOH Smoker, 1 ppd CAD, seen on CT scan Referred by Dr. Orland Mustard for coronary artery disease  Recent long complicated hospital course, notes reviewed S/p hosp d/c 11/1827 with complications pneumonia pancreatitic pseudocyst and  complicated hospitalization  Pancreatic pseudocyst with stent and pancreatic pleural effusion  -effusion resolved stent removal via ERCP 09/11/2018 [facility]   He is scheduled to follow-up with GI to have stent removed per the patient  Denies any significant shortness of breath or chest pain Continues to smoke 1 pack/day  Previous cardiac catheterization reviewed from 2018, images pulled up and reviewed personally by myself showing no significant coronary disease There is coronary calcification noted in the ostial LAD, ostial circumflex region consistent with findings on CT scan done recently -Report in the computer does not detail that there is no significant disease  Recent CT scan as detailed below showing coronary calcification ostial LAD, ostial left circumflex bifurcation region   Prior CV studies:   The following studies were reviewed today:  Ct scan FINDINGS: Cardiovascular: Aortic atherosclerosis. Borderline cardiomegaly, without pericardial effusion. Proximal LAD and possible distal left main coronary artery calcification  Past Medical History:  Diagnosis Date  . Abnormal EKG   . Allergy    Seasonal  . Anxiety   . Chicken pox   . Depression   . GERD (gastroesophageal reflux disease)   . Hyperlipidemia   . Hypertension   . Urine incontinence    Past Surgical History:  Procedure Laterality Date  . CHOLECYSTECTOMY  2000  . LEFT HEART CATH AND CORONARY ANGIOGRAPHY N/A 01/31/2017   Procedure: LEFT HEART CATH AND CORONARY ANGIOGRAPHY;  Surgeon: Dionisio David, MD;  Location: [facility];  Service: Cardiovascular;  Laterality: N/A;     Current Meds  Medication Sig  . acetaminophen (  TYLENOL) 325 MG tablet Take 2 tablets (650 mg total) by mouth every 6 (six) hours as needed for mild pain (or Fever >/= 101).  . budesonide-formoterol (SYMBICORT) 160-4.5 MCG/ACT inhaler Inhale 2 puffs into the lungs 2 (two) times daily. Rinse mouth after use  .  ipratropium-albuterol (DUONEB) 0.5-2.5 (3) MG/3ML SOLN Inhale 3 mLs into the lungs 4 (four) times daily.  Marland Kitchen levofloxacin (LEVAQUIN) 750 MG tablet Take 1 tablet (750 mg total) by mouth daily. With food  . LORazepam (ATIVAN) 0.5 MG tablet Take 1 tablet (0.5 mg total) by mouth daily as needed for anxiety.  Marland Kitchen losartan (COZAAR) 25 MG tablet Take 1 tablet (25 mg total) by mouth daily.  . Multiple Vitamin (MULTIVITAMIN WITH MINERALS) TABS tablet Take 1 tablet by mouth daily.  Marland Kitchen OVER THE COUNTER MEDICATION Take 1 capsule by mouth daily.  . pantoprazole (PROTONIX) 20 MG tablet Take 1 tablet (20 mg total) by mouth daily. 30 minutes before food  . pravastatin (PRAVACHOL) 20 MG tablet Take 1 tablet (20 mg total) by mouth at bedtime.  . sertraline (ZOLOFT) 50 MG tablet Take 1.5 tablets (75 mg total) by mouth daily. In am  . vitamin B-12 (CYANOCOBALAMIN) 1000 MCG tablet Take 1,000 mcg by mouth daily.      Allergies:   Patient has no known allergies.   Social History   Tobacco Use  . Smoking status: Current Every Day Smoker    Packs/day: 1.00    Years: 26.00    Pack years: 26.00    Types: Cigarettes  . Smokeless tobacco: Never Used  . Tobacco comment: little less than one ppd  Substance Use Topics  . Alcohol use: Yes    Alcohol/week: 21.0 standard drinks    Types: 21 Shots of liquor per week    Comment: 1 pt will last 1 week   . Drug use: No     Current Outpatient Medications on File Prior to Visit  Medication Sig Dispense Refill  . acetaminophen (TYLENOL) 325 MG tablet Take 2 tablets (650 mg total) by mouth every 6 (six) hours as needed for mild pain (or Fever >/= 101).    . budesonide-formoterol (SYMBICORT) 160-4.5 MCG/ACT inhaler Inhale 2 puffs into the lungs 2 (two) times daily. Rinse mouth after use 1 Inhaler 12  . ipratropium-albuterol (DUONEB) 0.5-2.5 (3) MG/3ML SOLN Inhale 3 mLs into the lungs 4 (four) times daily.    Marland Kitchen levofloxacin (LEVAQUIN) 750 MG tablet Take 1 tablet (750 mg total)  by mouth daily. With food 7 tablet 0  . LORazepam (ATIVAN) 0.5 MG tablet Take 1 tablet (0.5 mg total) by mouth daily as needed for anxiety. 30 tablet 2  . losartan (COZAAR) 25 MG tablet Take 1 tablet (25 mg total) by mouth daily. 90 tablet 3  . Multiple Vitamin (MULTIVITAMIN WITH MINERALS) TABS tablet Take 1 tablet by mouth daily.    Marland Kitchen OVER THE COUNTER MEDICATION Take 1 capsule by mouth daily.    . pantoprazole (PROTONIX) 20 MG tablet Take 1 tablet (20 mg total) by mouth daily. 30 minutes before food 90 tablet 3  . pravastatin (PRAVACHOL) 20 MG tablet Take 1 tablet (20 mg total) by mouth at bedtime. 90 tablet 3  . sertraline (ZOLOFT) 50 MG tablet Take 1.5 tablets (75 mg total) by mouth daily. In am 45 tablet 5  . vitamin B-12 (CYANOCOBALAMIN) 1000 MCG tablet Take 1,000 mcg by mouth daily.      No current facility-administered medications on file prior to visit.  Family Hx: The patient's family history includes Alcohol abuse in his father; Cancer in his father and maternal grandfather; Diabetes in his father.  ROS:   Please see the history of present illness.    Review of Systems  Constitutional: Negative.   Respiratory: Negative.   Cardiovascular: Negative.   Gastrointestinal: Negative.   Musculoskeletal: Negative.   Neurological: Negative.   Psychiatric/Behavioral: Negative.   All other systems reviewed and are negative.    Labs/Other Tests and Data Reviewed:    Recent Labs: 07/23/2018: B Natriuretic Peptide 50.0 08/27/2018: ALT 20; BUN 10; Creatinine, Ser 0.67; Hemoglobin 15.6; Magnesium 1.4; Platelets 114.0; Potassium 3.2; Sodium 146; TSH 0.90   Recent Lipid Panel Lab Results  Component Value Date/Time   CHOL 206 (H) 05/22/2018 08:00 AM   TRIG 192.0 (H) 05/22/2018 08:00 AM   HDL 62.60 05/22/2018 08:00 AM   CHOLHDL 3 05/22/2018 08:00 AM   LDLCALC 105 (H) 05/22/2018 08:00 AM    Wt Readings from Last 3 Encounters:  08/28/18 186 lb 3.2 oz (84.5 kg)  07/25/18 187 lb 11.2  oz (85.1 kg)  07/18/18 195 lb 1.7 oz (88.5 kg)     Exam:    Vital Signs: Vital signs may also be detailed in the HPI There were no vitals taken for this visit.  Wt Readings from Last 3 Encounters:  08/28/18 186 lb 3.2 oz (84.5 kg)  07/25/18 187 lb 11.2 oz (85.1 kg)  07/18/18 195 lb 1.7 oz (88.5 kg)   Temp Readings from Last 3 Encounters:  07/27/18 98.7 F (37.1 C) (Oral)  07/18/18 98.4 F (36.9 C) (Oral)  05/29/18 98.1 F (36.7 C) (Oral)   BP Readings from Last 3 Encounters:  08/28/18 122/86  07/27/18 97/68  07/18/18 127/83   Pulse Readings from Last 3 Encounters:  08/28/18 87  07/27/18 97  07/18/18 (!) 112    138/89,  88/57 105/53  Well nourished, well developed male in no acute distress. Appears inebriated Constitutional:  oriented to person, place, and time. No distress.  Head: Normocephalic and atraumatic.  Eyes:  no discharge. No scleral icterus.  Neck: Normal range of motion. Neck supple.  Pulmonary/Chest: No audible wheezing, no distress, appears comfortable Musculoskeletal: Normal range of motion.  no  tenderness or deformity.  Neurological:   Coordination normal. Full exam not performed Skin:  No rash Psychiatric:  normal mood and affect. behavior is normal. Thought content normal.    ASSESSMENT & PLAN:    Atherosclerosis of native coronary artery of native heart with stable angina pectoris (HCC) Recent cardiac catheterization with no significant disease He does have coronary calcification noted on CT scan No further testing needed, no anginal symptoms, no need for repeat catheterization Would recommend smoking cessation, low-dose aspirin with statin, goal LDL less than 70  Chronic obstructive pulmonary disease, unspecified COPD type ([place]) Recommended smoking cessation  Hyperlipidemia, unspecified hyperlipidemia type On pravastatin, goal LDL less than 70 If numbers continue to run high could add Zetia  Essential hypertension Blood pressure is  well controlled on today's visit. No changes made to the medications.  Pancreatic pseudocyst Recent hospitalization, alcohol cessation recommended appeared  Inebriated on today's video visit  Tobacco abuse Discussed various modalities for smoking cessation Wife will work with him on quitting   COVID-19 Education: The signs and symptoms of COVID-19 were discussed with the patient and how to seek care for testing (follow up with PCP or arrange E-visit).  The importance of social distancing was discussed today.  Patient  Risk:   After full review of this patients clinical status, I feel that they are at least moderate risk at this time.  Time:   Today, I have spent 60 minutes with the patient with telehealth technology discussing the cardiac and medical problems/diagnoses detailed above   10 min spent reviewing the chart prior to patient visit today Cardiac catheterization films pulled up and discussed, CT scan images pulled up and discussed with everybody on the call in detail Management strategies discussed including strategies for smoking cessation, cholesterol management   Medication Adjustments/Labs and Tests Ordered: Current medicines are reviewed at length with the patient today.  Concerns regarding medicines are outlined above.   Tests Ordered: No tests ordered   Medication Changes: No changes made   Disposition: Follow-up as needed   Signed, Ida Rogue, MD  09/03/2018 5:07 PM    [place] Office [address] [place] [address], [place], [place] [address]

## 2018-09-03 ENCOUNTER — Other Ambulatory Visit: Payer: Self-pay

## 2018-09-03 ENCOUNTER — Telehealth (INDEPENDENT_AMBULATORY_CARE_PROVIDER_SITE_OTHER): Payer: BLUE CROSS/BLUE SHIELD | Admitting: Cardiovascular Disease

## 2018-09-03 DIAGNOSIS — J449 Chronic obstructive pulmonary disease, unspecified: Secondary | ICD-10-CM

## 2018-09-03 DIAGNOSIS — I25118 Atherosclerotic heart disease of native coronary artery with other forms of angina pectoris: Secondary | ICD-10-CM | POA: Insufficient documentation

## 2018-09-03 DIAGNOSIS — E785 Hyperlipidemia, unspecified: Secondary | ICD-10-CM | POA: Diagnosis not present

## 2018-09-03 DIAGNOSIS — K863 Pseudocyst of pancreas: Secondary | ICD-10-CM

## 2018-09-03 DIAGNOSIS — I10 Essential (primary) hypertension: Secondary | ICD-10-CM | POA: Diagnosis not present

## 2018-09-03 DIAGNOSIS — Z720 Tobacco use: Secondary | ICD-10-CM

## 2018-09-03 NOTE — Patient Instructions (Signed)

## 2018-09-08 DIAGNOSIS — Z1159 Encounter for screening for other viral diseases: Secondary | ICD-10-CM | POA: Diagnosis not present

## 2018-09-11 DIAGNOSIS — J449 Chronic obstructive pulmonary disease, unspecified: Secondary | ICD-10-CM | POA: Diagnosis not present

## 2018-09-11 DIAGNOSIS — Z7982 Long term (current) use of aspirin: Secondary | ICD-10-CM | POA: Diagnosis not present

## 2018-09-11 DIAGNOSIS — I252 Old myocardial infarction: Secondary | ICD-10-CM | POA: Diagnosis not present

## 2018-09-11 DIAGNOSIS — F419 Anxiety disorder, unspecified: Secondary | ICD-10-CM | POA: Diagnosis not present

## 2018-09-11 DIAGNOSIS — I2510 Atherosclerotic heart disease of native coronary artery without angina pectoris: Secondary | ICD-10-CM | POA: Diagnosis not present

## 2018-09-11 DIAGNOSIS — I10 Essential (primary) hypertension: Secondary | ICD-10-CM | POA: Diagnosis not present

## 2018-09-11 DIAGNOSIS — Z79899 Other long term (current) drug therapy: Secondary | ICD-10-CM | POA: Diagnosis not present

## 2018-09-11 DIAGNOSIS — E7800 Pure hypercholesterolemia, unspecified: Secondary | ICD-10-CM | POA: Diagnosis not present

## 2018-09-11 DIAGNOSIS — M5430 Sciatica, unspecified side: Secondary | ICD-10-CM | POA: Diagnosis not present

## 2018-09-11 DIAGNOSIS — K219 Gastro-esophageal reflux disease without esophagitis: Secondary | ICD-10-CM | POA: Diagnosis not present

## 2018-09-11 DIAGNOSIS — K760 Fatty (change of) liver, not elsewhere classified: Secondary | ICD-10-CM | POA: Diagnosis not present

## 2018-09-11 DIAGNOSIS — Z9049 Acquired absence of other specified parts of digestive tract: Secondary | ICD-10-CM | POA: Diagnosis not present

## 2018-09-11 DIAGNOSIS — Z4659 Encounter for fitting and adjustment of other gastrointestinal appliance and device: Secondary | ICD-10-CM | POA: Diagnosis not present

## 2018-09-11 DIAGNOSIS — F17200 Nicotine dependence, unspecified, uncomplicated: Secondary | ICD-10-CM | POA: Diagnosis not present

## 2018-09-24 ENCOUNTER — Other Ambulatory Visit: Payer: Self-pay | Admitting: Internal Medicine

## 2018-09-24 DIAGNOSIS — F419 Anxiety disorder, unspecified: Secondary | ICD-10-CM

## 2018-09-24 DIAGNOSIS — F321 Major depressive disorder, single episode, moderate: Secondary | ICD-10-CM

## 2018-09-24 MED ORDER — SERTRALINE HCL 50 MG PO TABS: 75.0000 mg | ORAL_TABLET | Freq: Every day | ORAL | 5 refills | Status: DC

## 2018-10-02 ENCOUNTER — Other Ambulatory Visit: Payer: Self-pay | Admitting: Internal Medicine

## 2018-10-02 DIAGNOSIS — K219 Gastro-esophageal reflux disease without esophagitis: Secondary | ICD-10-CM

## 2018-10-02 MED ORDER — PANTOPRAZOLE SODIUM 20 MG PO TBEC: 20.0000 mg | DELAYED_RELEASE_TABLET | Freq: Every day | ORAL | 3 refills | Status: DC

## 2018-10-16 ENCOUNTER — Encounter: Payer: Self-pay | Admitting: Internal Medicine

## 2018-10-16 ENCOUNTER — Other Ambulatory Visit: Payer: Self-pay | Admitting: Internal Medicine

## 2018-10-16 DIAGNOSIS — F419 Anxiety disorder, unspecified: Secondary | ICD-10-CM

## 2018-10-16 MED ORDER — LORAZEPAM 0.5 MG PO TABS: 0.5000 mg | ORAL_TABLET | Freq: Every day | ORAL | 2 refills | Status: DC | PRN

## 2018-11-27 ENCOUNTER — Ambulatory Visit: Payer: BC Managed Care – PPO | Attending: Internal Medicine

## 2018-12-02 NOTE — Progress Notes (Deleted)
[facility] Consultation      Assessment and Plan:  Pneumonia and pleural effusion. - Recent hospital admission for pneumonia, status post drainage of right-sided pleural effusion.  Now appears resolved, was likely related to underlying pancreatic pseudocyst. - Patient was given reassurance that fatigue that was due to the pneumonia will take a few more weeks for him to recover from.  Lung nodule. - Small 13 mm right lower lobe lung nodule, was not seen on previous abdominal CT from November 2019, may be related to recent pneumonia. - Repeat CT chest in 3 months time.  COPD/emphysema with dyspnea on exertion. - Patient is currently on Symbicort twice daily, I advised him to use his nebulizer twice daily in addition to this.  Alcohol and nicotine abuse. - Smoking 1 pack/day, doing 2 shots of whiskey 3 nights per week, discussed cessation of both, spent more than 5 minutes in discussion.  He wants to quit smoking on his own, I gave him the number for the [place] quit line.    Date: 12/02/2018  MRN# [id number] Darrell Santiago 03/03/1966   Darrell Santiago is a 53 y.o. old male seen in consultation for chief complaint of:    No chief complaint on file.   HPI:  Darrell Santiago is a 53 y.o. male  with a history of pancreatitis in November 2019.  He was seen in the hospital from 4/6 to 07/29/2018, at that time noted to have a complex, possibly infected pseudocyst.  He was also noted to haveRight-sided bronchopneumonia-patient had pleural effusion on the right side status post thoracentesis and 1.2 L removedprior to admission.   At last visit the patient was slowly recovering from pneumonia. He was noted to have a hsitory of a 18mm lung nodule and asked to repeat CT chest in 3 months.    Patient is present today with persistent complaints of shortness of breath. the patient is noted to have minimal to no energy and he is breathing and energy give out easily.  He is  using symbicort 2 puffs twice per day, he feels that it helps somewhat. He also uses a nebulizer a few times per week, helps only a little bit. He notes a bit of a smokers cough, no hemoptysis other than when he was in the hospital.  He does not feel that he is improving, and continues to have those symptoms.  He appears to still be weak from the recent hospitalization and he is cold much of the time.   He has a dog at home, in the bedroom.  He denies reflux.  He takes lorazepam less than once per week.  He is not sleepy during the day, he snores but does not take naps during the day.   He is currently a smoker of about 1 pack/day. He has been smoking for about 26 years. He drinks "a couple shots" 3 times per week, down from daily. Shots are of whisky usually around bedtime.   **CT chest 08/13/2018>> imaging personally reviewed, there is a 13 mm nodule in the medial segment of the right lower lobe.  This was not seen on devious CT abdomen on 03/01/2018.  there is mild air trapping suggestive of COPD.  Lungs are otherwise unremarkable.  There does not appear to be any residual pneumonia or pleural effusion.  Social Hx:   Social History   Tobacco Use  . Smoking status: Current Every Day Smoker    Packs/day: 1.00  Years: 26.00    Pack years: 26.00    Types: Cigarettes  . Smokeless tobacco: Never Used  . Tobacco comment: little less than one ppd  Substance Use Topics  . Alcohol use: Yes    Alcohol/week: 21.0 standard drinks    Types: 21 Shots of liquor per week    Comment: 1 pt will last 1 week   . Drug use: No   Medication:    Current Outpatient Medications:  .  acetaminophen (TYLENOL) 325 MG tablet, Take 2 tablets (650 mg total) by mouth every 6 (six) hours as needed for mild pain (or Fever >/= 101)., Disp: , Rfl:  .  budesonide-formoterol (SYMBICORT) 160-4.5 MCG/ACT inhaler, Inhale 2 puffs into the lungs 2 (two) times daily. Rinse mouth after use, Disp: 1 Inhaler, Rfl: 12 .   ipratropium-albuterol (DUONEB) 0.5-2.5 (3) MG/3ML SOLN, Inhale 3 mLs into the lungs 4 (four) times daily., Disp: , Rfl:  .  levofloxacin (LEVAQUIN) 750 MG tablet, Take 1 tablet (750 mg total) by mouth daily. With food, Disp: 7 tablet, Rfl: 0 .  LORazepam (ATIVAN) 0.5 MG tablet, Take 1 tablet (0.5 mg total) by mouth daily as needed for anxiety or sleep., Disp: 30 tablet, Rfl: 2 .  losartan (COZAAR) 25 MG tablet, Take 1 tablet (25 mg total) by mouth daily., Disp: 90 tablet, Rfl: 3 .  Multiple Vitamin (MULTIVITAMIN WITH MINERALS) TABS tablet, Take 1 tablet by mouth daily., Disp: , Rfl:  .  OVER THE COUNTER MEDICATION, Take 1 capsule by mouth daily., Disp: , Rfl:  .  pantoprazole (PROTONIX) 20 MG tablet, Take 1 tablet (20 mg total) by mouth daily. 30 minutes before food, Disp: 90 tablet, Rfl: 3 .  pravastatin (PRAVACHOL) 20 MG tablet, Take 1 tablet (20 mg total) by mouth at bedtime., Disp: 90 tablet, Rfl: 3 .  sertraline (ZOLOFT) 50 MG tablet, Take 1.5 tablets (75 mg total) by mouth daily. In am, Disp: 90 tablet, Rfl: 5 .  vitamin B-12 (CYANOCOBALAMIN) 1000 MCG tablet, Take 1,000 mcg by mouth daily. , Disp: , Rfl:    Allergies:  Patient has no known allergies.      LABORATORY PANEL:   CBC No results for input(s): WBC, HGB, HCT, PLT in the last 168 hours. ------------------------------------------------------------------------------------------------------------------  Chemistries  No results for input(s): NA, K, CL, CO2, GLUCOSE, BUN, CREATININE, CALCIUM, MG, AST, ALT, ALKPHOS, BILITOT in the last 168 hours.  Invalid input(s): GFRCGP ------------------------------------------------------------------------------------------------------------------  Cardiac Enzymes No results for input(s): TROPONINI in the last 168 hours. ------------------------------------------------------------  RADIOLOGY:  No results found.     Thank  you for the consultation and for allowing [facility] to assist in the care of your patient. Our recommendations are noted above.  Please contact us if we can be of further service.   Marda Stalker, M.D., F.C.C.P.  Board Certified in Internal Medicine, Pulmonary Medicine, [place], and Sleep Medicine.  [facility] Office Number: [phone]   12/02/2018

## 2018-12-03 ENCOUNTER — Ambulatory Visit: Payer: BLUE CROSS/BLUE SHIELD | Admitting: Internal Medicine

## 2018-12-12 ENCOUNTER — Telehealth: Payer: BC Managed Care – PPO | Admitting: Nurse Practitioner

## 2018-12-12 DIAGNOSIS — R112 Nausea with vomiting, unspecified: Secondary | ICD-10-CM

## 2018-12-12 MED ORDER — ONDANSETRON HCL 4 MG PO TABS: 4.0000 mg | ORAL_TABLET | Freq: Three times a day (TID) | ORAL | 0 refills | Status: DC | PRN

## 2018-12-12 NOTE — Progress Notes (Signed)
We are sorry that you are not feeling well. Here is how we plan to help!  Based on what you have shared with me it looks like you have a Virus that is irritating your GI tract.  Vomiting is the forceful emptying of a portion of the stomach's content through the mouth.  Although nausea and vomiting can make you feel miserable, it's important to remember that these are not diseases, but rather symptoms of an underlying illness.  When we treat short term symptoms, we always caution that any symptoms that persist should be fully evaluated in a medical office.  I have prescribed a medication that will help alleviate your symptoms and allow you to stay hydrated:  Zofran 4 mg 1 tablet every 8 hours as needed for nausea and vomiting   * if this does not help you will need to go to the ER.  HOME CARE:  Drink clear liquids.  This is very important! Dehydration (the lack of fluid) can lead to a serious complication.  Start off with 1 tablespoon every 5 minutes for 8 hours.  You may begin eating bland foods after 8 hours without vomiting.  Start with saltine crackers, white bread, rice, mashed potatoes, applesauce.  After 48 hours on a bland diet, you may resume a normal diet.  Try to go to sleep.  Sleep often empties the stomach and relieves the need to vomit.  GET HELP RIGHT AWAY IF:   Your symptoms do not improve or worsen within 2 days after treatment.  You have a fever for over 3 days.  You cannot keep down fluids after trying the medication.  MAKE SURE YOU:   Understand these instructions.  Will watch your condition.  Will get help right away if you are not doing well or get worse.   Thank you for choosing an e-visit. Your e-visit answers were reviewed by a board certified advanced clinical practitioner to complete your personal care plan. Depending upon the condition, your plan could have included both over the counter or prescription medications. Please review your pharmacy  choice. Be sure that the pharmacy you have chosen is open so that you can pick up your prescription now.  If there is a problem you may message your provider in [place] to have the prescription routed to another pharmacy. Your safety is important to [place]. If you have drug allergies check your prescription carefully.  For the next 24 hours, you can use MyChart to ask questions about today's visit, request a non-urgent call back, or ask for a work or school excuse from your e-visit provider. You will get an e-mail in the next two days asking about your experience. I hope that your e-visit has been valuable and will speed your recovery.   5-10 minutes spent reviewing and documenting in chart.

## 2019-01-01 ENCOUNTER — Other Ambulatory Visit (INDEPENDENT_AMBULATORY_CARE_PROVIDER_SITE_OTHER): Payer: BC Managed Care – PPO

## 2019-01-01 ENCOUNTER — Other Ambulatory Visit: Payer: Self-pay

## 2019-01-01 ENCOUNTER — Other Ambulatory Visit: Payer: Self-pay | Admitting: Internal Medicine

## 2019-01-01 ENCOUNTER — Ambulatory Visit: Payer: BC Managed Care – PPO | Admitting: *Deleted

## 2019-01-01 DIAGNOSIS — I10 Essential (primary) hypertension: Secondary | ICD-10-CM

## 2019-01-01 DIAGNOSIS — E876 Hypokalemia: Secondary | ICD-10-CM

## 2019-01-01 DIAGNOSIS — R748 Abnormal levels of other serum enzymes: Secondary | ICD-10-CM

## 2019-01-01 DIAGNOSIS — Z23 Encounter for immunization: Secondary | ICD-10-CM

## 2019-01-01 LAB — LIPID PANEL
Cholesterol: 206 mg/dL — ABNORMAL HIGH (ref 0–200)
HDL: 72.2 mg/dL (ref >39.00)
LDL Cholesterol: 107 mg/dL — ABNORMAL HIGH (ref 0–99)
NonHDL: 133.4
Total CHOL/HDL Ratio: 3
Triglycerides: 133 mg/dL (ref 0.0–149.0)
VLDL: 26.6 mg/dL (ref 0.0–40.0)

## 2019-01-02 ENCOUNTER — Other Ambulatory Visit (INDEPENDENT_AMBULATORY_CARE_PROVIDER_SITE_OTHER): Payer: BC Managed Care – PPO

## 2019-01-02 DIAGNOSIS — E876 Hypokalemia: Secondary | ICD-10-CM

## 2019-01-02 DIAGNOSIS — R748 Abnormal levels of other serum enzymes: Secondary | ICD-10-CM | POA: Diagnosis not present

## 2019-01-02 LAB — COMPREHENSIVE METABOLIC PANEL
ALT: 19 U/L (ref 0–53)
AST: 35 U/L (ref 0–37)
Albumin: 4.4 g/dL (ref 3.5–5.2)
Alkaline Phosphatase: 100 U/L (ref 39–117)
BUN: 10 mg/dL (ref 6–23)
CO2: 27 mEq/L (ref 19–32)
Calcium: 10.2 mg/dL (ref 8.4–10.5)
Chloride: 105 mEq/L (ref 96–112)
Creatinine, Ser: 0.65 mg/dL (ref 0.40–1.50)
GFR: 129.33 mL/min (ref >60.00)
Glucose, Bld: 95 mg/dL (ref 70–99)
Potassium: 3.6 mEq/L (ref 3.5–5.1)
Sodium: 145 mEq/L (ref 135–145)
Total Bilirubin: 0.6 mg/dL (ref 0.2–1.2)
Total Protein: 7.1 g/dL (ref 6.0–8.3)

## 2019-01-08 ENCOUNTER — Ambulatory Visit: Payer: BLUE CROSS/BLUE SHIELD | Admitting: Internal Medicine

## 2019-01-18 NOTE — Progress Notes (Signed)
Patient checked in for lab appointment.  Patient went to lab for labs but left after lab appt.  Patient was also scheduled for a nurse visit to get the Twinrix vaccination but was not in lobby.

## 2019-01-21 IMAGING — US US ABDOMEN COMPLETE
1 series · 14 of 25 positions shown · non-contrast
Comparison: 10/22/2009 CT of the abdomen and pelvis.

CLINICAL DATA: Elevated LFTs

EXAM:
ABDOMEN ULTRASOUND COMPLETE

[Series 1: us abdomen complete · 0.26mm/px · 14 of 86 slices shown]
[im 1/86]
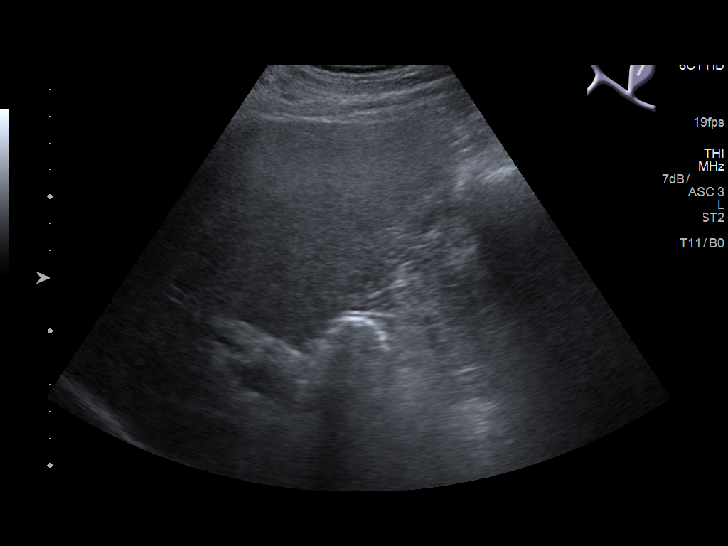
[im 8/86]
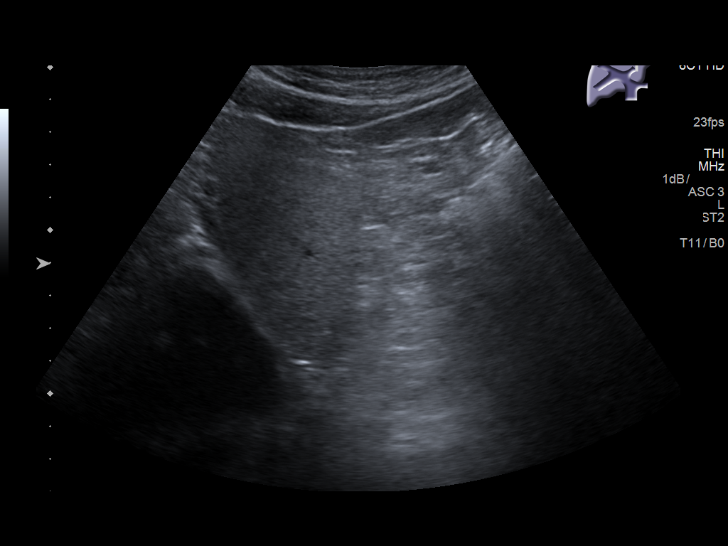
[im 15/86]
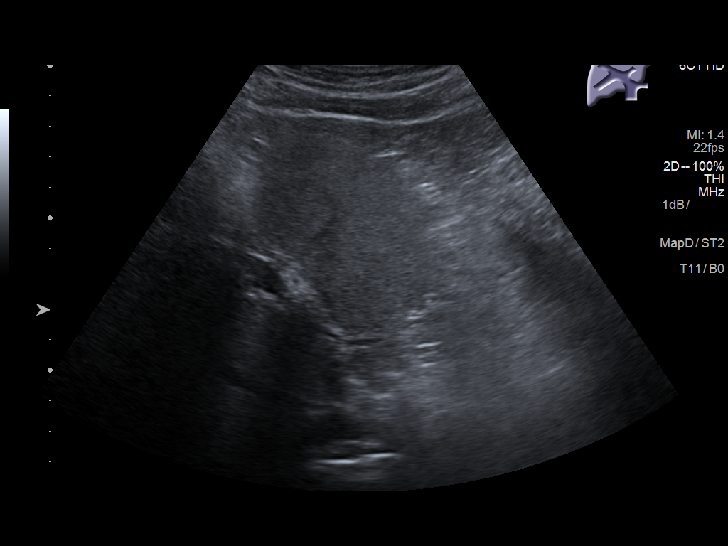
[im 22/86]
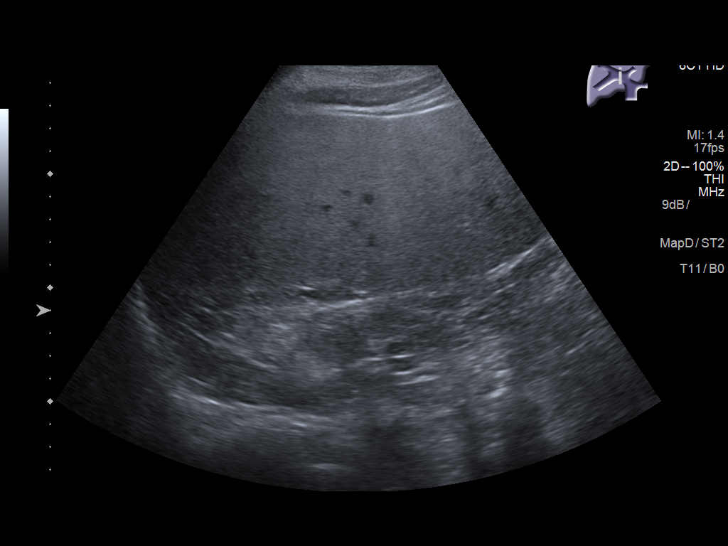
[im 29/86]
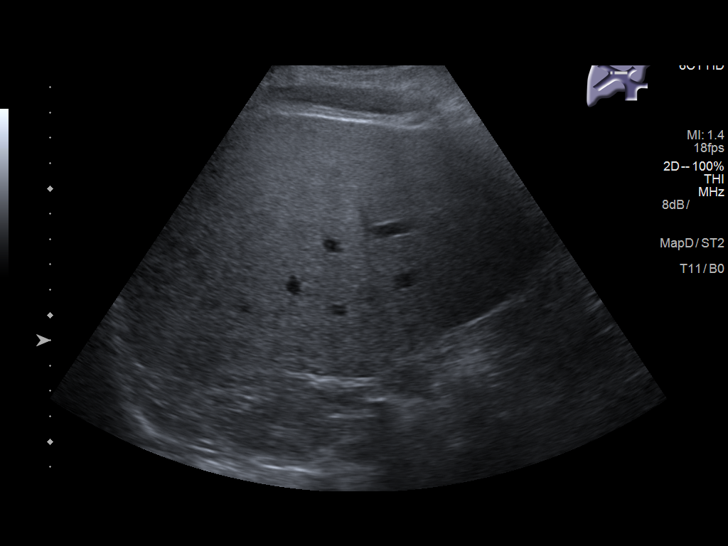
[im 32/86]
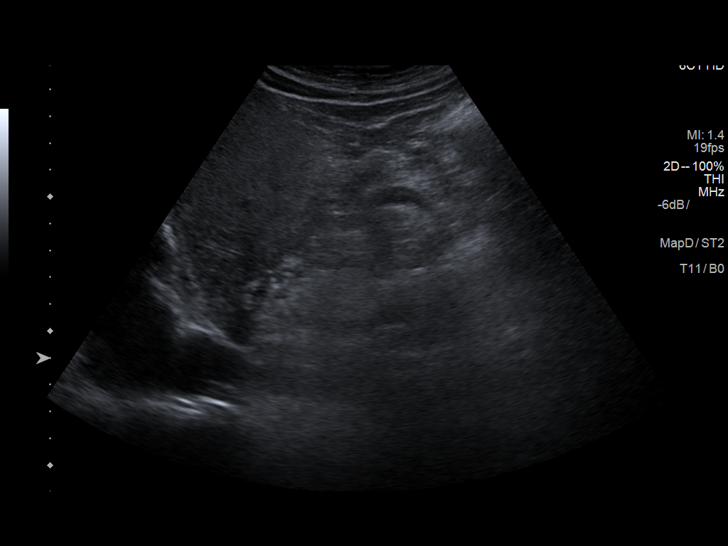
[im 39/86]
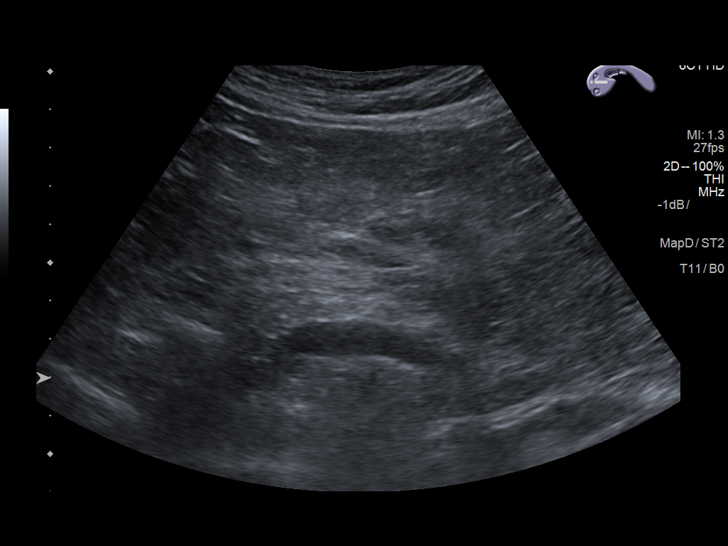
[im 47/86]
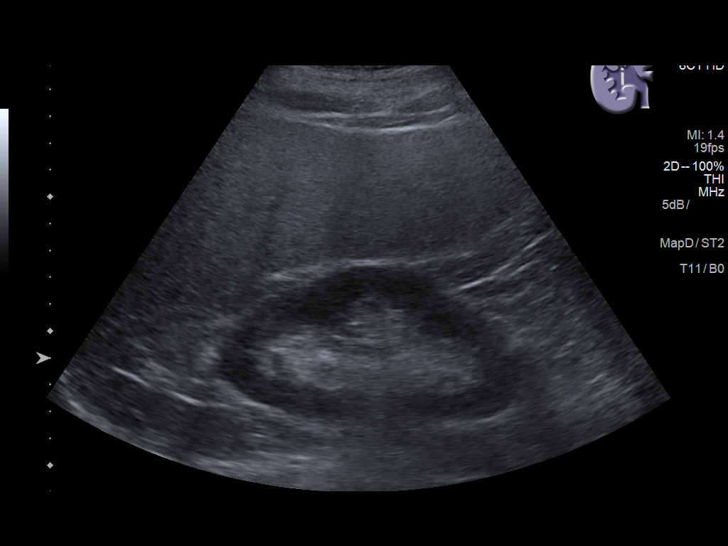
[im 54/86]
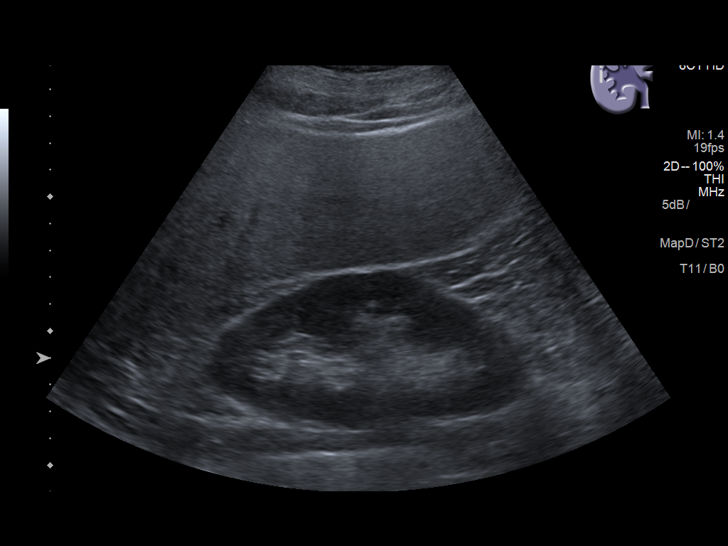
[im 57/86]
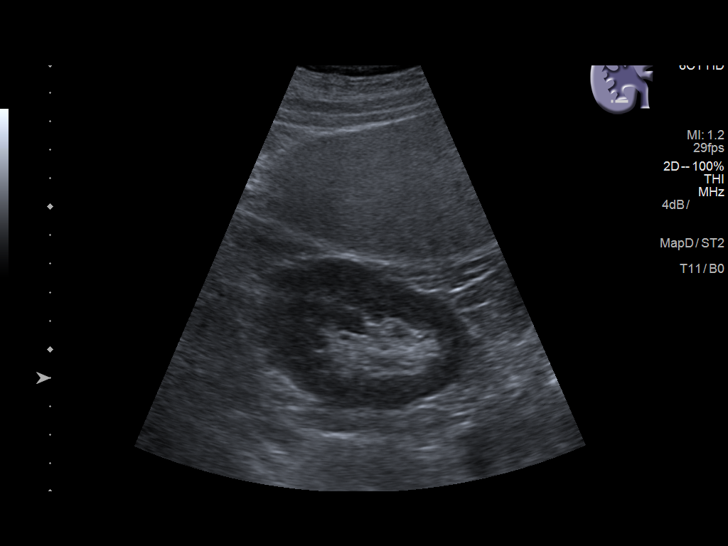
[im 64/86]
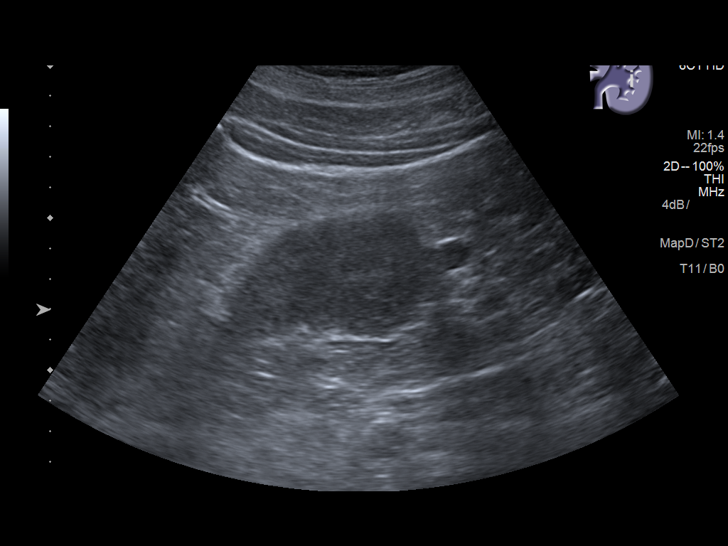
[im 71/86]
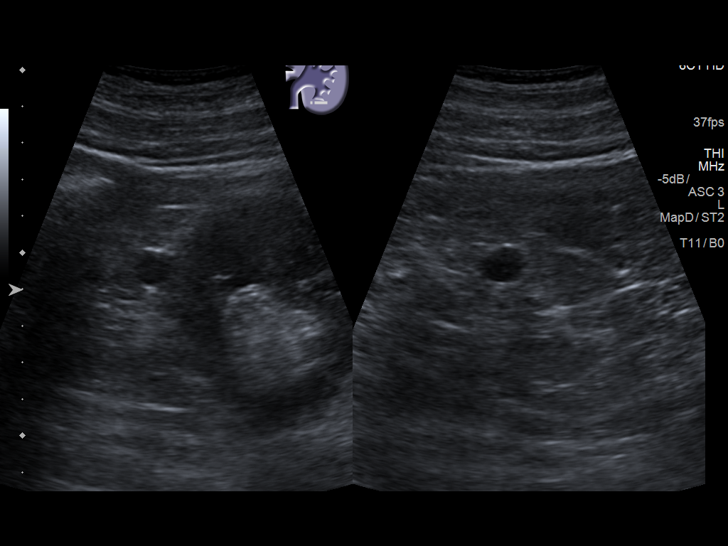
[im 78/86]
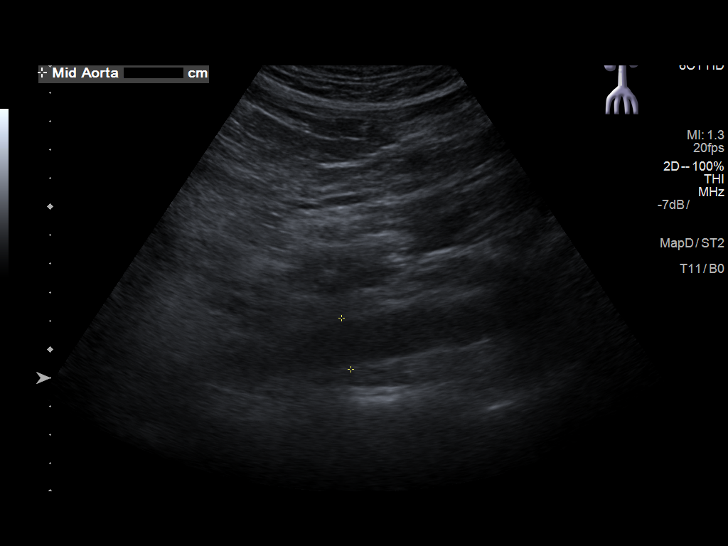
[im 86/86]
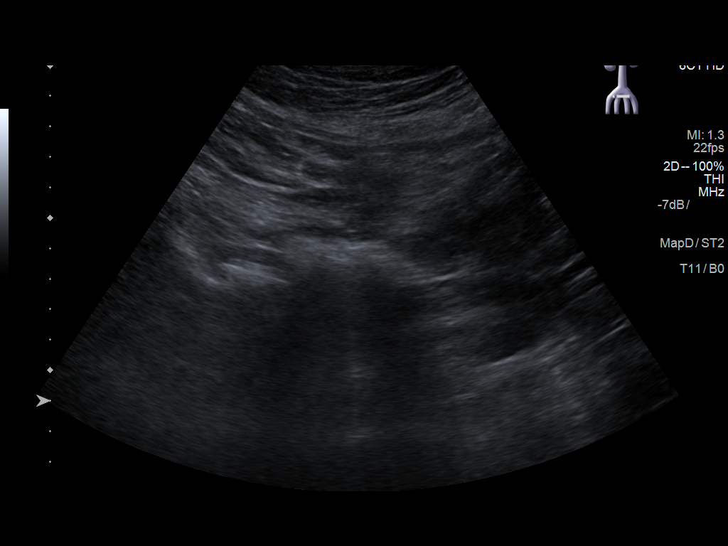

[14 of 25 positions shown; findings below may reference images not displayed]

FINDINGS: Gallbladder: Surgically removed

Common bile duct: Diameter: 7.1 mm.

Liver: Diffuse increased echogenicity is noted consistent with fatty
infiltration. This is stable from the prior CT examination. No
underlying mass is noted. Portal vein is patent on color Doppler
imaging with normal direction of blood flow towards the liver.

IVC: No abnormality visualized.

Pancreas: Visualized portion unremarkable.

Spleen: Size and appearance within normal limits.

Right Kidney: Length: 11.3 cm.. Echogenicity within normal limits.
No mass or hydronephrosis visualized.

Left Kidney: Length: 11.7 cm.. Echogenicity within normal limits. No
mass or hydronephrosis visualized. Small 1 cm cyst is noted in the
lower pole.

Abdominal aorta: No aneurysm visualized.

Other findings: None.
IMPRESSION: Fatty liver.

Small left renal cyst.

## 2019-02-07 ENCOUNTER — Other Ambulatory Visit: Payer: Self-pay

## 2019-02-07 ENCOUNTER — Encounter: Payer: Self-pay | Admitting: Internal Medicine

## 2019-02-07 ENCOUNTER — Ambulatory Visit: Payer: BC Managed Care – PPO | Admitting: Internal Medicine

## 2019-02-07 VITALS — BP 136/80 | HR 86 | Temp 98.3°F | Ht 69.0 in | Wt 185.6 lb

## 2019-02-07 DIAGNOSIS — Z0000 Encounter for general adult medical examination without abnormal findings: Secondary | ICD-10-CM | POA: Diagnosis not present

## 2019-02-07 DIAGNOSIS — J449 Chronic obstructive pulmonary disease, unspecified: Secondary | ICD-10-CM

## 2019-02-07 DIAGNOSIS — F4321 Adjustment disorder with depressed mood: Secondary | ICD-10-CM

## 2019-02-07 DIAGNOSIS — Z720 Tobacco use: Secondary | ICD-10-CM

## 2019-02-07 DIAGNOSIS — K852 Alcohol induced acute pancreatitis without necrosis or infection: Secondary | ICD-10-CM

## 2019-02-07 DIAGNOSIS — I10 Essential (primary) hypertension: Secondary | ICD-10-CM

## 2019-02-07 DIAGNOSIS — D696 Thrombocytopenia, unspecified: Secondary | ICD-10-CM

## 2019-02-07 DIAGNOSIS — Z23 Encounter for immunization: Secondary | ICD-10-CM | POA: Diagnosis not present

## 2019-02-07 MED ORDER — LOSARTAN POTASSIUM 50 MG PO TABS: 50.0000 mg | ORAL_TABLET | Freq: Every day | ORAL | 3 refills | Status: DC

## 2019-02-07 MED ORDER — BUDESONIDE-FORMOTEROL FUMARATE 160-4.5 MCG/ACT IN AERO: 2.0000 | INHALATION_SPRAY | Freq: Two times a day (BID) | RESPIRATORY_TRACT | 12 refills | Status: DC

## 2019-02-07 NOTE — Progress Notes (Signed)
Chief Complaint  Patient presents with  . Follow-up   annual 1. htn elevated on losartan 25 mg qd med was reduced months ago when sick with pancreatitis  2. twinrix 3/3 vaccines today  3. Pancreatitis resolved and stent removed 5 or 09/2018 [facility] doing well still drinking etoh but reduced and reduced amt smoking  4. Tobacco abuse smoking <1 ppd  5. Grief dad died 11/08/2018 coping well   Review of Systems  Constitutional: Positive for weight loss.       Down 10lbs   HENT: Negative for hearing loss.   Eyes: Negative for blurred vision.  Respiratory: Negative for shortness of breath.   Cardiovascular: Negative for chest pain.  Gastrointestinal: Negative for abdominal pain, nausea and vomiting.  Musculoskeletal: Negative for falls.  Skin: Negative for rash.  Neurological: Negative for headaches.  Psychiatric/Behavioral: Negative for depression.   Past Medical History:  Diagnosis Date  . Abnormal EKG   . Allergy    Seasonal  . Anxiety   . Chicken pox   . Depression   . GERD (gastroesophageal reflux disease)   . Hyperlipidemia   . Hypertension   . Urine incontinence    Past Surgical History:  Procedure Laterality Date  . CHOLECYSTECTOMY  2000  . LEFT HEART CATH AND CORONARY ANGIOGRAPHY N/A 01/31/2017   Procedure: LEFT HEART CATH AND CORONARY ANGIOGRAPHY;  Surgeon: Dionisio David, MD;  Location: [facility];  Service: Cardiovascular;  Laterality: N/A;   Family History  Problem Relation Age of Onset  . Alcohol abuse Father   . Diabetes Father   . Cancer Father        ?stage IV thyroid with met  . Cancer Maternal Grandfather        Colon Cancer   Social History   Socioeconomic History  . Marital status: Married    Spouse name: Not on file  . Number of children: Not on file  . Years of education: Not on file  . Highest education level: Not on file  Occupational History  . Not on file  Social Needs  . Financial resource strain: Not on file  . Food  insecurity    Worry: Not on file    Inability: Not on file  . Transportation needs    Medical: Not on file    Non-medical: Not on file  Tobacco Use  . Smoking status: Current Every Day Smoker    Packs/day: 1.00    Years: 26.00    Pack years: 26.00    Types: Cigarettes  . Smokeless tobacco: Never Used  . Tobacco comment: little less than one ppd  Substance and Sexual Activity  . Alcohol use: Yes    Alcohol/week: 21.0 standard drinks    Types: 21 Shots of liquor per week    Comment: 1 pt will last 1 week   . Drug use: No  . Sexual activity: Yes    Partners: Female  Lifestyle  . Physical activity    Days per week: Not on file    Minutes per session: Not on file  . Stress: Not on file  Relationships  . Social Herbalist on phone: Not on file    Gets together: Not on file    Attends religious service: Not on file    Active member of club or organization: Not on file    Attends meetings of clubs or organizations: Not on file    Relationship status: Not on file  .  Intimate partner violence    Fear of current or ex partner: Not on file    Emotionally abused: Not on file    Physically abused: Not on file    Forced sexual activity: Not on file  Other Topics Concern  . 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 acetaminophen (TYLENOL) 325 MG tablet Take 2 tablets (650 mg total) by mouth every 6 (six) hours as needed for mild pain (or Fever >/= 101).  . budesonide-formoterol (SYMBICORT) 160-4.5 MCG/ACT inhaler Inhale 2 puffs into the lungs 2 (two) times daily. Rinse mouth after use  . ipratropium-albuterol (DUONEB) 0.5-2.5 (3) MG/3ML SOLN Inhale 3 mLs into the lungs 4 (four) times daily.  Marland Kitchen LORazepam (ATIVAN) 0.5 MG tablet Take 1 tablet (0.5 mg total) by mouth daily as needed for anxiety or  sleep.  Marland Kitchen losartan (COZAAR) 25 MG tablet Take 1 tablet (25 mg total) by mouth daily.  . Multiple Vitamin (MULTIVITAMIN WITH MINERALS) TABS tablet Take 1 tablet by mouth daily.  . ondansetron (ZOFRAN) 4 MG tablet Take 1 tablet (4 mg total) by mouth every 8 (eight) hours as needed for nausea or vomiting.  Marland Kitchen OVER THE COUNTER MEDICATION Take 1 capsule by mouth daily.  . pantoprazole (PROTONIX) 20 MG tablet Take 1 tablet (20 mg total) by mouth daily. 30 minutes before food  . pravastatin (PRAVACHOL) 20 MG tablet Take 1 tablet (20 mg total) by mouth at bedtime.  . sertraline (ZOLOFT) 50 MG tablet Take 1.5 tablets (75 mg total) by mouth daily. In am  . vitamin B-12 (CYANOCOBALAMIN) 1000 MCG tablet Take 1,000 mcg by mouth daily.   . [DISCONTINUED] budesonide-formoterol (SYMBICORT) 160-4.5 MCG/ACT inhaler Inhale 2 puffs into the lungs 2 (two) times daily. Rinse mouth after use  . [DISCONTINUED] levofloxacin (LEVAQUIN) 750 MG tablet Take 1 tablet (750 mg total) by mouth daily. With food   No Known Allergies Recent Results (from the past 2160 hour(s))  Lipid panel     Status: Abnormal   Collection Time: 01/01/19  8:03 AM  Result Value Ref Range   Cholesterol 206 (H) 0 - 200 mg/dL    Comment: ATP III Classification       Desirable:  < 200 mg/dL               Borderline High:  200 - 239 mg/dL          High:  > = 240 mg/dL   Triglycerides 133.0 0.0 - 149.0 mg/dL    Comment: Normal:  <150 mg/dLBorderline High:  150 - 199 mg/dL   HDL 72.20 >39.00 mg/dL   VLDL 26.6 0.0 - 40.0 mg/dL   LDL Cholesterol 107 (H) 0 - 99 mg/dL   Total CHOL/HDL Ratio 3     Comment:                Men          Women1/2 Average Risk     3.4          3.3Average Risk          5.0          4.42X Average Risk          9.6  7.13X Average Risk          15.0          11.0                       NonHDL 133.40     Comment: NOTE:  Non-HDL goal should be 30 mg/dL higher than patient's LDL goal (i.e. LDL goal of < 70 mg/dL, would have  non-HDL goal of < 100 mg/dL)  Comprehensive metabolic panel     Status: None   Collection Time: 01/02/19 10:49 AM  Result Value Ref Range   Sodium 145 135 - 145 mEq/L   Potassium 3.6 3.5 - 5.1 mEq/L   Chloride 105 96 - 112 mEq/L   CO2 27 19 - 32 mEq/L   Glucose, Bld 95 70 - 99 mg/dL   BUN 10 6 - 23 mg/dL   Creatinine, Ser 0.65 0.40 - 1.50 mg/dL   Total Bilirubin 0.6 0.2 - 1.2 mg/dL   Alkaline Phosphatase 100 39 - 117 U/L   AST 35 0 - 37 U/L   ALT 19 0 - 53 U/L   Total Protein 7.1 6.0 - 8.3 g/dL   Albumin 4.4 3.5 - 5.2 g/dL   Calcium 10.2 8.4 - 10.5 mg/dL   GFR 129.33 >60.00 mL/min   Objective  Body mass index is 27.41 kg/m. Wt Readings from Last 3 Encounters:  02/07/19 185 lb 9.6 oz (84.2 kg)  08/28/18 186 lb 3.2 oz (84.5 kg)  07/25/18 187 lb 11.2 oz (85.1 kg)   Temp Readings from Last 3 Encounters:  02/07/19 98.3 F (36.8 C) (Oral)  07/27/18 98.7 F (37.1 C) (Oral)  07/18/18 98.4 F (36.9 C) (Oral)   BP Readings from Last 3 Encounters:  02/07/19 136/80  08/28/18 122/86  07/27/18 97/68   Pulse Readings from Last 3 Encounters:  02/07/19 86  08/28/18 87  07/27/18 97    Physical Exam Vitals signs and nursing note reviewed.  Constitutional:      Appearance: Normal appearance. He is well-developed and well-groomed. He is obese.     Comments: +mask on    HENT:     Head: Normocephalic and atraumatic.  Eyes:     Conjunctiva/sclera: Conjunctivae normal.     Pupils: Pupils are equal, round, and reactive to light.  Cardiovascular:     Rate and Rhythm: Normal rate and regular rhythm.     Heart sounds: Normal heart sounds.  Pulmonary:     Effort: Pulmonary effort is normal.     Breath sounds: Normal breath sounds.  Abdominal:     Tenderness: There is no abdominal tenderness.  Skin:    General: Skin is warm and dry.  Neurological:     General: No focal deficit present.     Mental Status: He is alert and oriented to person, place, and time. Mental status is at  baseline.     Gait: Gait normal.  Psychiatric:        Attention and Perception: Attention and perception normal.        Mood and Affect: Mood and affect normal.        Speech: Speech normal.        Behavior: Behavior normal. Behavior is cooperative.        Thought Content: Thought content normal.        Cognition and Memory: Cognition and memory normal.        Judgment: Judgment normal.     Assessment  Plan  Alcohol-induced acute pancreatitis rec cessation   Chronic obstructive pulmonary disease, unspecified COPD type ([place]) - Plan: budesonide-formoterol (SYMBICORT) 160-4.5 MCG/ACT inhaler -will need repeat CT chest   Tobacco abuse rec cessation   HTN increase losartan 25 to 50 mg qd   Grief coping  Annual  Declines flu shot, pna 23  Tdap UTD twinrix3/3 today  rec smoking cessation pt has cut back  Disc colonscopy pt wants to think about itagain as of 02/07/19 wants to wait until 04/2019 PSA had 08/27/18 0.80  Provider: Dr. Olivia Mackie McLean-Scocuzza-[facility] Medicine

## 2019-02-07 NOTE — Patient Instructions (Addendum)
LET ME KNOW ABOUT COLONOSCOPY   Colonoscopy, Adult A colonoscopy is an exam to look at the entire large intestine. During the exam, a lubricated, flexible tube that has a camera on the end of it is inserted into the anus and then passed into the rectum, colon, and other parts of the large intestine. You may have a colonoscopy as a part of normal colorectal screening or if you have certain symptoms, such as:  Lack of red blood cells (anemia).  Diarrhea that does not go away.  Abdominal pain.  Blood in your stool (feces). A colonoscopy can help screen for and diagnose medical problems, including:  Tumors.  Polyps.  Inflammation.  Areas of bleeding. Tell a health care provider about:  Any allergies you have.  All medicines you are taking, including vitamins, herbs, eye drops, creams, and over-the-counter medicines.  Any problems you or family members have had with anesthetic medicines.  Any blood disorders you have.  Any surgeries you have had.  Any medical conditions you have.  Any problems you have had passing stool. What are the risks? Generally, this is a safe procedure. However, problems may occur, including:  Bleeding.  A tear in the intestine.  A reaction to medicines given during the exam.  Infection (rare). What happens before the procedure? Eating and drinking restrictions Follow instructions from your health care provider about eating and drinking, which may include:  A few days before the procedure - follow a low-fiber diet. Avoid nuts, seeds, dried fruit, raw fruits, and vegetables.  1-3 days before the procedure - follow a clear liquid diet. Drink only clear liquids, such as clear broth or bouillon, black coffee or tea, clear juice, clear soft drinks or sports drinks, gelatin dessert, and popsicles. Avoid any liquids that contain red or purple dye.  On the day of the procedure - do not eat or drink anything starting 2 hours before the procedure, or  within the time period that your health care provider recommends. Up to 2 hours before the procedure, you may continue to drink clear liquids, such as water or clear fruit juice. Bowel prep If you were prescribed an oral bowel prep to clean out your colon:  Take it as told by your health care provider. Starting the day before your procedure, you will need to drink a large amount of medicated liquid. The liquid will cause you to have multiple loose stools until your stool is almost clear or light green.  If your skin or anus gets irritated from diarrhea, you may use these to relieve the irritation: ? Medicated wipes, such as adult wet wipes with aloe and vitamin E. ? A skin-soothing product like petroleum jelly.  If you vomit while drinking the bowel prep, take a break for up to 60 minutes and then begin the bowel prep again. If vomiting continues and you cannot take the bowel prep without vomiting, call your health care provider.  To clean out your colon, you may also be given: ? Laxative medicines. ? Instructions about how to use an enema. General instructions  Ask your health care provider about: ? Changing or stopping your regular medicines or supplements. This is especially important if you are taking iron supplements, diabetes medicines, or blood thinners. ? Taking medicines such as aspirin and ibuprofen. These medicines can thin your blood. Do not take these medicines before the procedure if your health care provider tells you not to.  Plan to have someone take you home from the hospital  or clinic. What happens during the procedure?   An IV may be inserted into one of your veins.  You will be given medicine to help you relax (sedative).  To reduce your risk of infection: ? Your health care team will wash or sanitize their hands. ? Your anal area will be washed with soap.  You will be asked to lie on your side with your knees bent.  Your health care provider will lubricate a  long, thin, flexible tube. The tube will have a camera and a light on the end.  The tube will be inserted into your anus.  The tube will be gently eased through your rectum and colon.  Air will be delivered into your colon to keep it open. You may feel some pressure or cramping.  The camera will be used to take images during the procedure.  A small tissue sample may be removed to be examined under a microscope (biopsy).  If small polyps are found, your health care provider may remove them and have them checked for cancer cells.  When the exam is done, the tube will be removed. The procedure may vary among health care providers and hospitals. What happens after the procedure?  Your blood pressure, heart rate, breathing rate, and blood oxygen level will be monitored until the medicines you were given have worn off.  Do not drive for 24 hours after the exam.  You may have a small amount of blood in your stool.  You may pass gas and have mild abdominal cramping or bloating due to the air that was used to inflate your colon during the exam.  It is up to you to get the results of your procedure. Ask your health care provider, or the department performing the procedure, when your results will be ready. Summary  A colonoscopy is an exam to look at the entire large intestine.  During a colonoscopy, a lubricated, flexible tube with a camera on the end of it is inserted into the anus and then passed into the colon and other parts of the large intestine.  Follow instructions from your health care provider about eating and drinking before the procedure.  If you were prescribed an oral bowel prep to clean out your colon, take it as told by your health care provider.  After your procedure, your blood pressure, heart rate, breathing rate, and blood oxygen level will be monitored until the medicines you were given have worn off. This information is not intended to replace advice given to you by  your health care provider. Make sure you discuss any questions you have with your health care provider. Document Released: 04/01/2000 Document Revised: 01/25/2017 Document Reviewed: 06/16/2015 Elsevier Patient Education  2020 [employer].

## 2019-02-11 ENCOUNTER — Encounter: Payer: Self-pay | Admitting: Internal Medicine

## 2019-02-18 ENCOUNTER — Telehealth: Payer: Self-pay | Admitting: Internal Medicine

## 2019-02-18 ENCOUNTER — Ambulatory Visit: Admission: RE | Admit: 2019-02-18 | Payer: BC Managed Care – PPO | Source: Ambulatory Visit

## 2019-02-18 NOTE — Telephone Encounter (Signed)
Please call pt and get CT rescheduled or give him the # please  [place]

## 2019-03-06 ENCOUNTER — Encounter: Payer: Self-pay | Admitting: Internal Medicine

## 2019-03-06 ENCOUNTER — Other Ambulatory Visit: Payer: Self-pay | Admitting: Internal Medicine

## 2019-03-06 DIAGNOSIS — F321 Major depressive disorder, single episode, moderate: Secondary | ICD-10-CM

## 2019-03-06 DIAGNOSIS — F419 Anxiety disorder, unspecified: Secondary | ICD-10-CM

## 2019-03-06 MED ORDER — SERTRALINE HCL 100 MG PO TABS: 100.0000 mg | ORAL_TABLET | Freq: Every day | ORAL | 3 refills | Status: DC

## 2019-05-09 ENCOUNTER — Telehealth: Payer: BC Managed Care – PPO | Admitting: Physician Assistant

## 2019-05-09 DIAGNOSIS — H00014 Hordeolum externum left upper eyelid: Secondary | ICD-10-CM

## 2019-05-09 MED ORDER — ERYTHROMYCIN 5 MG/GM OP OINT: TOPICAL_OINTMENT | OPHTHALMIC | 0 refills | Status: DC

## 2019-05-09 NOTE — Progress Notes (Signed)
We are sorry that you are not feeling well. Here is how we plan to help!  Based on what you have shared with me it looks like you have a stye.  A stye is an inflammation of the eyelid.  It is often a red, painful lump near the edge of the eyelid that may look like a boil or a pimple.  A stye develops when an infection occurs at the base of an eyelash.   We have made appropriate suggestions for you based upon your presentation: Simple styes can be treated without medical intervention.  Most styes either resolve spontaneously or resolve with simple home treatment by applying warm compresses or heated washcloth to the stye for about 10-15 minutes three to four times a day. This causes the stye to drain and resolve.   I will prescribe erythromycin which you may fill if you have no improvement in the next 2-3 days.  If symptoms worsen, your eyelid begins to swell, or you have vision changes please plan for a face-to-face visit for further evaluation.  HOME CARE:   Wash your hands often!  Let the stye open on its own. Don't squeeze or open it.  Don't rub your eyes. This can irritate your eyes and let in bacteria.  If you need to touch your eyes, wash your hands first.  Don't wear eye makeup or contact lenses until the area has healed.  GET HELP RIGHT AWAY IF: Greater than 5 minutes, yet less than 10 minutes of time have been spent researching, coordinating, and implementing care for this patient today   Your symptoms do not improve.  You develop blurred or loss of vision.  Your symptoms worsen (increased discharge, pain or redness).  Thank you for choosing an e-visit.  Your e-visit answers were reviewed by a board certified advanced clinical practitioner to complete your personal care plan.  Depending upon the condition, your plan could have included both over the counter or prescription medications.  Please review your pharmacy choice.  Make sure the pharmacy is open so you can pick up  prescription now.  If there is a problem, you may contact your provider through [employer] and have the prescription routed to another pharmacy.    Your safety is important to [place].  If you have drug allergies check your prescription carefully.  For the next 24 hours you can use MyChart to ask questions about today's visit, request a non-urgent call back, or ask for a work or school excuse.  You will get an email in the next two days asking about your experience.  I hope you that your e-visit has been valuable and will speed your recovery.  Greater than 5 minutes, yet less than 10 minutes of time have been spent researching, coordinating, and implementing care for this patient today

## 2019-07-02 ENCOUNTER — Telehealth: Payer: Self-pay | Admitting: Internal Medicine

## 2019-07-02 NOTE — Telephone Encounter (Signed)
Next appt disc urinary retention urology and colonoscopy   [place]

## 2019-07-22 DIAGNOSIS — M778 Other enthesopathies, not elsewhere classified: Secondary | ICD-10-CM | POA: Diagnosis not present

## 2019-07-22 DIAGNOSIS — M79671 Pain in right foot: Secondary | ICD-10-CM | POA: Diagnosis not present

## 2019-07-22 DIAGNOSIS — M722 Plantar fascial fibromatosis: Secondary | ICD-10-CM | POA: Diagnosis not present

## 2019-07-22 DIAGNOSIS — M79672 Pain in left foot: Secondary | ICD-10-CM | POA: Diagnosis not present

## 2019-08-02 ENCOUNTER — Other Ambulatory Visit: Payer: BC Managed Care – PPO

## 2019-08-09 ENCOUNTER — Ambulatory Visit: Payer: BC Managed Care – PPO | Admitting: Internal Medicine

## 2019-08-22 DIAGNOSIS — M722 Plantar fascial fibromatosis: Secondary | ICD-10-CM | POA: Diagnosis not present

## 2019-08-22 DIAGNOSIS — M778 Other enthesopathies, not elsewhere classified: Secondary | ICD-10-CM | POA: Diagnosis not present

## 2019-08-30 ENCOUNTER — Other Ambulatory Visit: Payer: Self-pay

## 2019-08-30 ENCOUNTER — Other Ambulatory Visit (INDEPENDENT_AMBULATORY_CARE_PROVIDER_SITE_OTHER): Payer: BC Managed Care – PPO

## 2019-08-30 DIAGNOSIS — D696 Thrombocytopenia, unspecified: Secondary | ICD-10-CM | POA: Diagnosis not present

## 2019-08-30 DIAGNOSIS — I10 Essential (primary) hypertension: Secondary | ICD-10-CM

## 2019-08-30 LAB — CBC WITH DIFFERENTIAL/PLATELET
Basophils Absolute: 0.1 10*3/uL (ref 0.0–0.1)
Basophils Relative: 1 % (ref 0.0–3.0)
Eosinophils Absolute: 0.2 10*3/uL (ref 0.0–0.7)
Eosinophils Relative: 3.6 % (ref 0.0–5.0)
HCT: 46.2 % (ref 39.0–52.0)
Hemoglobin: 15.9 g/dL (ref 13.0–17.0)
Lymphocytes Relative: 28.1 % (ref 12.0–46.0)
Lymphs Abs: 1.5 10*3/uL (ref 0.7–4.0)
MCHC: 34.5 g/dL (ref 30.0–36.0)
MCV: 103 fl — ABNORMAL HIGH (ref 78.0–100.0)
Monocytes Absolute: 0.5 10*3/uL (ref 0.1–1.0)
Monocytes Relative: 9.8 % (ref 3.0–12.0)
Neutro Abs: 3.1 10*3/uL (ref 1.4–7.7)
Neutrophils Relative %: 57.5 % (ref 43.0–77.0)
Platelets: 137 10*3/uL — ABNORMAL LOW (ref 150.0–400.0)
RBC: 4.48 Mil/uL (ref 4.22–5.81)
RDW: 14.8 % (ref 11.5–15.5)
WBC: 5.3 10*3/uL (ref 4.0–10.5)

## 2019-08-30 LAB — COMPREHENSIVE METABOLIC PANEL
ALT: 25 U/L (ref 0–53)
AST: 38 U/L — ABNORMAL HIGH (ref 0–37)
Albumin: 4.7 g/dL (ref 3.5–5.2)
Alkaline Phosphatase: 83 U/L (ref 39–117)
BUN: 14 mg/dL (ref 6–23)
CO2: 30 mEq/L (ref 19–32)
Calcium: 9.9 mg/dL (ref 8.4–10.5)
Chloride: 105 mEq/L (ref 96–112)
Creatinine, Ser: 0.67 mg/dL (ref 0.40–1.50)
GFR: 124.56 mL/min (ref >60.00)
Glucose, Bld: 98 mg/dL (ref 70–99)
Potassium: 3.5 mEq/L (ref 3.5–5.1)
Sodium: 143 mEq/L (ref 135–145)
Total Bilirubin: 0.8 mg/dL (ref 0.2–1.2)
Total Protein: 7.2 g/dL (ref 6.0–8.3)

## 2019-08-30 LAB — LIPID PANEL
Cholesterol: 235 mg/dL — ABNORMAL HIGH (ref 0–200)
HDL: 96.6 mg/dL (ref >39.00)
LDL Cholesterol: 111 mg/dL — ABNORMAL HIGH (ref 0–99)
NonHDL: 138.77
Total CHOL/HDL Ratio: 2
Triglycerides: 138 mg/dL (ref 0.0–149.0)
VLDL: 27.6 mg/dL (ref 0.0–40.0)

## 2019-09-05 ENCOUNTER — Ambulatory Visit: Payer: BC Managed Care – PPO | Admitting: Internal Medicine

## 2019-09-05 ENCOUNTER — Other Ambulatory Visit: Payer: Self-pay

## 2019-09-05 ENCOUNTER — Encounter: Payer: Self-pay | Admitting: Internal Medicine

## 2019-09-05 VITALS — BP 150/94 | HR 96 | Temp 98.6°F | Ht 69.02 in | Wt 184.6 lb

## 2019-09-05 DIAGNOSIS — E663 Overweight: Secondary | ICD-10-CM

## 2019-09-05 DIAGNOSIS — R748 Abnormal levels of other serum enzymes: Secondary | ICD-10-CM | POA: Diagnosis not present

## 2019-09-05 DIAGNOSIS — E785 Hyperlipidemia, unspecified: Secondary | ICD-10-CM | POA: Diagnosis not present

## 2019-09-05 DIAGNOSIS — R350 Frequency of micturition: Secondary | ICD-10-CM

## 2019-09-05 DIAGNOSIS — F1010 Alcohol abuse, uncomplicated: Secondary | ICD-10-CM

## 2019-09-05 DIAGNOSIS — Z1211 Encounter for screening for malignant neoplasm of colon: Secondary | ICD-10-CM

## 2019-09-05 DIAGNOSIS — R3915 Urgency of urination: Secondary | ICD-10-CM

## 2019-09-05 DIAGNOSIS — Z125 Encounter for screening for malignant neoplasm of prostate: Secondary | ICD-10-CM

## 2019-09-05 DIAGNOSIS — I10 Essential (primary) hypertension: Secondary | ICD-10-CM | POA: Diagnosis not present

## 2019-09-05 DIAGNOSIS — K760 Fatty (change of) liver, not elsewhere classified: Secondary | ICD-10-CM | POA: Diagnosis not present

## 2019-09-05 DIAGNOSIS — N3943 Post-void dribbling: Secondary | ICD-10-CM

## 2019-09-05 DIAGNOSIS — F439 Reaction to severe stress, unspecified: Secondary | ICD-10-CM

## 2019-09-05 DIAGNOSIS — K219 Gastro-esophageal reflux disease without esophagitis: Secondary | ICD-10-CM

## 2019-09-05 MED ORDER — PANTOPRAZOLE SODIUM 20 MG PO TBEC: 20.0000 mg | DELAYED_RELEASE_TABLET | Freq: Every day | ORAL | 3 refills | Status: DC

## 2019-09-05 MED ORDER — PRAVASTATIN SODIUM 20 MG PO TABS: 20.0000 mg | ORAL_TABLET | Freq: Every day | ORAL | 3 refills | Status: DC

## 2019-09-05 MED ORDER — AMLODIPINE BESYLATE 2.5 MG PO TABS: 2.5000 mg | ORAL_TABLET | Freq: Every day | ORAL | 3 refills | Status: DC

## 2019-09-05 NOTE — Patient Instructions (Addendum)
Monitor BP goal <130/<80 Get blood pressure cuff  High Cholesterol   High cholesterol is a condition in which the blood has high levels of a white, waxy, fat-like substance (cholesterol). The human body needs small amounts of cholesterol. The liver makes all the cholesterol that the body needs. Extra (excess) cholesterol comes from the food that we eat. Cholesterol is carried from the liver by the blood through the blood vessels. If you have high cholesterol, deposits (plaques) may build up on the walls of your blood vessels (arteries). Plaques make the arteries narrower and stiffer. Cholesterol plaques increase your risk for heart attack and stroke. Work with your health care provider to keep your cholesterol levels in a healthy range. What increases the risk? This condition is more likely to develop in people who:  Eat foods that are high in animal fat (saturated fat) or cholesterol.  Are overweight.  Are not getting enough exercise.  Have a family history of high cholesterol. What are the signs or symptoms? There are no symptoms of this condition. How is this diagnosed? This condition may be diagnosed from the results of a blood test.  If you are older than age 38, your health care provider may check your cholesterol every 4-6 years.  You may be checked more often if you already have high cholesterol or other risk factors for heart disease. The blood test for cholesterol measures:  "Bad" cholesterol (LDL cholesterol). This is the main type of cholesterol that causes heart disease. The desired level for LDL is less than 100.  "Good" cholesterol (HDL cholesterol). This type helps to protect against heart disease by cleaning the arteries and carrying the LDL away. The desired level for HDL is 60 or higher.  Triglycerides. These are fats that the body can store or burn for energy. The desired number for triglycerides is lower than 150.  Total cholesterol. This is a measure of the  total amount of cholesterol in your blood, including LDL cholesterol, HDL cholesterol, and triglycerides. A healthy number is less than 200. How is this treated? This condition is treated with diet changes, lifestyle changes, and medicines. Diet changes  This may include eating more whole grains, fruits, vegetables, nuts, and fish.  This may also include cutting back on red meat and foods that have a lot of added sugar. Lifestyle changes  Changes may include getting at least 40 minutes of aerobic exercise 3 times a week. Aerobic exercises include walking, biking, and swimming. Aerobic exercise along with a healthy diet can help you maintain a healthy weight.  Changes may also include quitting smoking. Medicines  Medicines are usually given if diet and lifestyle changes have failed to reduce your cholesterol to healthy levels.  Your health care provider may prescribe a statin medicine. Statin medicines have been shown to reduce cholesterol, which can reduce the risk of heart disease. Follow these instructions at home: Eating and drinking If told by your health care provider:  Eat chicken (without skin), fish, veal, shellfish, ground [place] breast, and round or loin cuts of red meat.  Do not eat fried foods or fatty meats, such as hot dogs and salami.  Eat plenty of fruits, such as apples.  Eat plenty of vegetables, such as broccoli, potatoes, and carrots.  Eat beans, peas, and lentils.  Eat grains such as barley, rice, couscous, and bulgur wheat.  Eat pasta without cream sauces.  Use skim or nonfat milk, and eat low-fat or nonfat yogurt and cheeses.  Do not  eat or drink whole milk, cream, ice cream, egg yolks, or hard cheeses.  Do not eat stick margarine or tub margarines that contain trans fats (also called partially hydrogenated oils).  Do not eat saturated tropical oils, such as coconut oil and palm oil.  Do not eat cakes, cookies, crackers, or other baked goods that  contain trans fats.  General instructions  Exercise as directed by your health care provider. Increase your activity level with activities such as gardening, walking, and taking the stairs.  Take over-the-counter and prescription medicines only as told by your health care provider.  Do not use any products that contain nicotine or tobacco, such as cigarettes and e-cigarettes. If you need help quitting, ask your health care provider.  Keep all follow-up visits as told by your health care provider. This is important. Contact a health care provider if:  You are struggling to maintain a healthy diet or weight.  You need help to start on an exercise program.  You need help to stop smoking. Get help right away if:  You have chest pain.  You have trouble breathing. This information is not intended to replace advice given to you by your health care provider. Make sure you discuss any questions you have with your health care provider. Document Revised: 04/07/2017 Document Reviewed: 10/03/2015 Elsevier Patient Education  [place].  Cholesterol Content in Foods Cholesterol is a waxy, fat-like substance that helps to carry fat in the blood. The body needs cholesterol in small amounts, but too much cholesterol can cause damage to the arteries and heart. Most people should eat less than 200 milligrams (mg) of cholesterol a day. Foods with cholesterol  Cholesterol is found in animal-based foods, such as meat, seafood, and dairy. Generally, low-fat dairy and lean meats have less cholesterol than full-fat dairy and fatty meats. The milligrams of cholesterol per serving (mg per serving) of common cholesterol-containing foods are listed below. Meat and other proteins  Egg -- one large whole egg has 186 mg.  Veal shank -- 4 oz has 141 mg.  Lean ground [place] (93% lean) -- 4 oz has 118 mg.  Fat-trimmed lamb loin -- 4 oz has 106 mg.  Lean ground beef (90% lean) -- 4 oz has 100  mg.  Lobster -- 3.5 oz has 90 mg.  Pork loin chops -- 4 oz has 86 mg.  Canned salmon -- 3.5 oz has 83 mg.  Fat-trimmed beef top loin -- 4 oz has 78 mg.  Frankfurter -- 1 frank (3.5 oz) has 77 mg.  Crab -- 3.5 oz has 71 mg.  Roasted chicken without skin, white meat -- 4 oz has 66 mg.  Light bologna -- 2 oz has 45 mg.  Deli-cut [place] -- 2 oz has 31 mg.  Canned tuna -- 3.5 oz has 31 mg.  Berniece Salines -- 1 oz has 29 mg.  Oysters and mussels (raw) -- 3.5 oz has 25 mg.  Mackerel -- 1 oz has 22 mg.  Trout -- 1 oz has 20 mg.  Pork sausage -- 1 link (1 oz) has 17 mg.  Salmon -- 1 oz has 16 mg.  Tilapia -- 1 oz has 14 mg. Dairy  Soft-serve ice cream --  cup (4 oz) has 103 mg.  Whole-milk yogurt -- 1 cup (8 oz) has 29 mg.  Cheddar cheese -- 1 oz has 28 mg.  American cheese -- 1 oz has 28 mg.  Whole milk -- 1 cup (8 oz) has 23 mg.  2% milk -- 1 cup (8 oz) has 18 mg.  Cream cheese -- 1 tablespoon (Tbsp) has 15 mg.  Cottage cheese --  cup (4 oz) has 14 mg.  Low-fat (1%) milk -- 1 cup (8 oz) has 10 mg.  Sour cream -- 1 Tbsp has 8.5 mg.  Low-fat yogurt -- 1 cup (8 oz) has 8 mg.  Nonfat Greek yogurt -- 1 cup (8 oz) has 7 mg.  Half-and-half cream -- 1 Tbsp has 5 mg. Fats and oils  Cod liver oil -- 1 tablespoon (Tbsp) has 82 mg.  Butter -- 1 Tbsp has 15 mg.  Lard -- 1 Tbsp has 14 mg.  Bacon grease -- 1 Tbsp has 14 mg.  Mayonnaise -- 1 Tbsp has 5-10 mg.  Margarine -- 1 Tbsp has 3-10 mg. Exact amounts of cholesterol in these foods may vary depending on specific ingredients and brands. Foods without cholesterol Most plant-based foods do not have cholesterol unless you combine them with a food that has cholesterol. Foods without cholesterol include:  Grains and cereals.  Vegetables.  Fruits.  Vegetable oils, such as olive, canola, and sunflower oil.  Legumes, such as peas, beans, and lentils.  Nuts and seeds.  Egg whites. Summary  The body needs  cholesterol in small amounts, but too much cholesterol can cause damage to the arteries and heart.  Most people should eat less than 200 milligrams (mg) of cholesterol a day. This information is not intended to replace advice given to you by your health care provider. Make sure you discuss any questions you have with your health care provider. Document Revised: 03/17/2017 Document Reviewed: 11/29/2016 Elsevier Patient Education  [place].

## 2019-09-05 NOTE — Progress Notes (Signed)
Chief Complaint  Patient presents with  . Follow-up   F/u  1. HTN elevated on losartan 50 mg qd  2. HLD was not taking pravachol 20 mg qhs but will start to take  3. Alcohol abuse per pt reducing etoh reviewed labs plts low and explained etoh could be reason as well as elevated lfts  4. C/o dribbling with urination and not emptying bladder and urgency and cant hold it and at times nothing comes out  5. Agreeable to colonoscopy  6. Stress/anxiety due to not getting along with 46 y.o son on zoloft 100 mg qd   Review of Systems  Constitutional: Positive for weight loss.       Down 2 lbs   HENT: Negative for hearing loss.   Eyes: Negative for blurred vision.  Respiratory: Negative for shortness of breath.   Cardiovascular: Negative for chest pain.  Gastrointestinal: Positive for heartburn. Negative for abdominal pain.  Musculoskeletal: Negative for falls.  Neurological: Negative for headaches.  Psychiatric/Behavioral:       +stress     Past Medical History:  Diagnosis Date  . Abnormal EKG   . Allergy    Seasonal  . Anxiety   . Chicken pox   . Depression   . GERD (gastroesophageal reflux disease)   . Hyperlipidemia   . Hypertension   . Urine incontinence    Past Surgical History:  Procedure Laterality Date  . CHOLECYSTECTOMY  2000  . LEFT HEART CATH AND CORONARY ANGIOGRAPHY N/A 01/31/2017   Procedure: LEFT HEART CATH AND CORONARY ANGIOGRAPHY;  Surgeon: Dionisio David, MD;  Location: [facility];  Service: Cardiovascular;  Laterality: N/A;   Family History  Problem Relation Age of Onset  . Alcohol abuse Father   . Diabetes Father   . Cancer Father        ?stage IV thyroid with met  . Cancer Maternal Grandfather        Colon Cancer   Social History   Socioeconomic History  . Marital status: Married    Spouse name: Not on file  . Number of children: Not on file  . Years of education: Not on file  . Highest education level: Not on file  Occupational  History  . Not on file  Tobacco Use  . Smoking status: Current Every Day Smoker    Packs/day: 1.00    Years: 26.00    Pack years: 26.00    Types: Cigarettes  . Smokeless tobacco: Never Used  . Tobacco comment: little less than one ppd  Substance and Sexual Activity  . Alcohol use: Yes    Alcohol/week: 21.0 standard drinks    Types: 21 Shots of liquor per week    Comment: 1 pt will last 1 week   . Drug use: No  . Sexual activity: Yes    Partners: Female  Other Topics Concern  . 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 Difficulty of Paying Living Expenses:   Food Insecurity:   . Worried About Charity fundraiser in the Last Year:   . Arboriculturist in the Last Year:   Transportation Needs:   . Film/video editor (Medical):   Marland Kitchen Lack of Transportation (Non-Medical):  Physical Activity:   . Days of Exercise per Week:   . Minutes of Exercise per Session:   Stress:   . Feeling of Stress :   Social Connections:   . Frequency of Communication with Friends and Family:   . Frequency of Social Gatherings with Friends and Family:   . Attends Religious Services:   . Active Member of Clubs or Organizations:   . Attends Archivist Meetings:   Marland Kitchen Marital Status:   Intimate Partner Violence:   . Fear of Current or Ex-Partner:   . Emotionally Abused:   Marland Kitchen Physically Abused:   . Sexually Abused:    Current Meds  Medication Sig  . acetaminophen (TYLENOL) 325 MG tablet Take 2 tablets (650 mg total) by mouth every 6 (six) hours as needed for mild pain (or Fever >/= 101).  Marland Kitchen aspirin 81 MG chewable tablet Chew by mouth.  . budesonide-formoterol (SYMBICORT) 160-4.5 MCG/ACT inhaler Inhale 2 puffs into the lungs 2 (two) times daily. Rinse mouth after use  .  erythromycin ophthalmic ointment Place a 1/2 inch ribbon of ointment into the lower eyelid every 4 hours for 5 days  . ipratropium-albuterol (DUONEB) 0.5-2.5 (3) MG/3ML SOLN Inhale into the lungs.  Marland Kitchen LORazepam (ATIVAN) 0.5 MG tablet Take 1 tablet (0.5 mg total) by mouth daily as needed for anxiety or sleep.  Marland Kitchen losartan (COZAAR) 50 MG tablet Take 1 tablet (50 mg total) by mouth daily.  . Multiple Vitamin (MULTI-VITAMIN) tablet Take by mouth.  . Multiple Vitamin (MULTIVITAMIN WITH MINERALS) TABS tablet Take 1 tablet by mouth daily.  . ondansetron (ZOFRAN) 4 MG tablet Take 1 tablet (4 mg total) by mouth every 8 (eight) hours as needed for nausea or vomiting.  Marland Kitchen OVER THE COUNTER MEDICATION Take 1 capsule by mouth daily.  . pantoprazole (PROTONIX) 20 MG tablet Take 1 tablet (20 mg total) by mouth daily. 30 minutes before food  . pravastatin (PRAVACHOL) 20 MG tablet Take 1 tablet (20 mg total) by mouth at bedtime.  . prednisoLONE 5 MG (48) TBPK Take as directed. 12 day taper.  6-6-5-5-4-4-3-3-2-2-1-1.  Marland Kitchen sertraline (ZOLOFT) 100 MG tablet Take 1 tablet (100 mg total) by mouth daily. In am  . vitamin B-12 (CYANOCOBALAMIN) 1000 MCG tablet Take 1,000 mcg by mouth daily.   . [DISCONTINUED] pantoprazole (PROTONIX) 20 MG tablet Take 1 tablet (20 mg total) by mouth daily. 30 minutes before food  . [DISCONTINUED] pravastatin (PRAVACHOL) 20 MG tablet Take 1 tablet (20 mg total) by mouth at bedtime.   No Known Allergies Recent Results (from the past 2160 hour(s))  Lipid panel     Status: Abnormal   Collection Time: 08/30/19  8:03 AM  Result Value Ref Range   Cholesterol 235 (H) 0 - 200 mg/dL    Comment: ATP III Classification       Desirable:  < 200 mg/dL               Borderline High:  200 - 239 mg/dL          High:  > = 240 mg/dL   Triglycerides 138.0 0.0 - 149.0 mg/dL    Comment: Normal:  <150 mg/dLBorderline High:  150 - 199 mg/dL   HDL 96.60 >39.00 mg/dL   VLDL 27.6 0.0 - 40.0 mg/dL   LDL Cholesterol  111 (H) 0 - 99 mg/dL   Total CHOL/HDL Ratio 2     Comment:  Men          Women1/2 Average Risk     3.4          3.3Average Risk          5.0          4.42X Average Risk          9.6          7.13X Average Risk          15.0          11.0                       NonHDL 138.77     Comment: NOTE:  Non-HDL goal should be 30 mg/dL higher than patient's LDL goal (i.e. LDL goal of < 70 mg/dL, would have non-HDL goal of < 100 mg/dL)  Comprehensive metabolic panel     Status: Abnormal   Collection Time: 08/30/19  8:03 AM  Result Value Ref Range   Sodium 143 135 - 145 mEq/L   Potassium 3.5 3.5 - 5.1 mEq/L   Chloride 105 96 - 112 mEq/L   CO2 30 19 - 32 mEq/L   Glucose, Bld 98 70 - 99 mg/dL   BUN 14 6 - 23 mg/dL   Creatinine, Ser 0.67 0.40 - 1.50 mg/dL   Total Bilirubin 0.8 0.2 - 1.2 mg/dL   Alkaline Phosphatase 83 39 - 117 U/L   AST 38 (H) 0 - 37 U/L   ALT 25 0 - 53 U/L   Total Protein 7.2 6.0 - 8.3 g/dL   Albumin 4.7 3.5 - 5.2 g/dL   GFR 124.56 >60.00 mL/min   Calcium 9.9 8.4 - 10.5 mg/dL  CBC with Differential/Platelet     Status: Abnormal   Collection Time: 08/30/19  8:03 AM  Result Value Ref Range   WBC 5.3 4.0 - 10.5 K/uL   RBC 4.48 4.22 - 5.81 Mil/uL   Hemoglobin 15.9 13.0 - 17.0 g/dL   HCT 46.2 39.0 - 52.0 %   MCV 103.0 (H) 78.0 - 100.0 fl   MCHC 34.5 30.0 - 36.0 g/dL   RDW 14.8 11.5 - 15.5 %   Platelets 137.0 (L) 150.0 - 400.0 K/uL   Neutrophils Relative % 57.5 43.0 - 77.0 %   Lymphocytes Relative 28.1 12.0 - 46.0 %   Monocytes Relative 9.8 3.0 - 12.0 %   Eosinophils Relative 3.6 0.0 - 5.0 %   Basophils Relative 1.0 0.0 - 3.0 %   Neutro Abs 3.1 1.4 - 7.7 K/uL   Lymphs Abs 1.5 0.7 - 4.0 K/uL   Monocytes Absolute 0.5 0.1 - 1.0 K/uL   Eosinophils Absolute 0.2 0.0 - 0.7 K/uL   Basophils Absolute 0.1 0.0 - 0.1 K/uL   Objective  Body mass index is 27.25 kg/m. Wt Readings from Last 3 Encounters:  09/05/19 184 lb 9.6 oz (83.7 kg)  02/07/19 185 lb 9.6 oz (84.2 kg)   08/28/18 186 lb 3.2 oz (84.5 kg)   Temp Readings from Last 3 Encounters:  09/05/19 98.6 F (37 C)  02/07/19 98.3 F (36.8 C) (Oral)  07/27/18 98.7 F (37.1 C) (Oral)   BP Readings from Last 3 Encounters:  09/05/19 (!) 150/94  02/07/19 136/80  08/28/18 122/86   Pulse Readings from Last 3 Encounters:  09/05/19 96  02/07/19 86  08/28/18 87    Physical Exam Vitals and nursing note reviewed.  Constitutional:      General: He  is sleeping.     Appearance: Normal appearance. He is well-developed, well-groomed and overweight.  HENT:     Head: Normocephalic and atraumatic.  Eyes:     Conjunctiva/sclera: Conjunctivae normal.     Pupils: Pupils are equal, round, and reactive to light.  Cardiovascular:     Rate and Rhythm: Normal rate and regular rhythm.     Heart sounds: Normal heart sounds. No murmur.  Pulmonary:     Effort: Pulmonary effort is normal.     Breath sounds: Normal breath sounds.  Skin:    General: Skin is warm and dry.  Neurological:     General: No focal deficit present.     Mental Status: He is oriented to person, place, and time. Mental status is at baseline.     Gait: Gait normal.  Psychiatric:        Attention and Perception: Attention and perception normal.        Mood and Affect: Mood and affect normal.        Speech: Speech normal.        Behavior: Behavior normal. Behavior is cooperative.        Thought Content: Thought content normal.        Cognition and Memory: Cognition and memory normal.        Judgment: Judgment normal.     Assessment  Plan  Essential hypertension - Plan: amLODipine (NORVASC) 2.5 MG tablet cont losartan 50 mg qd   Fatty liver with elevated lfts rec healthy diet and exercise  Stop etoh   Hyperlipidemia, unspecified hyperlipidemia type/overweight - Plan: pravastatin (PRAVACHOL) 20 MG tablet rec compliance Healthy diet and exercise   Gastroesophageal reflux disease - Plan: pantoprazole (PROTONIX) 20 MG  tablet  Stress/anxiety/depression  Cont zoloft 100 mg qd   Alcohol abuse rec cessation  HM Declines flu shot, pna 23  Tdap UTD twinrix3/3 utd -repeat Hep A/B titer with labs in future  rec smoking cessation pt has cut back  referred colonscopy [place] pt wants outpt colonoscopy PSA had 08/27/18 0.80 -referred urology  -will need PSA and DRE with urology   Provider: Dr. Olivia Mackie McLean-Scocuzza-Internal Medicine

## 2019-09-13 ENCOUNTER — Other Ambulatory Visit: Payer: Self-pay | Admitting: Internal Medicine

## 2019-09-13 DIAGNOSIS — E785 Hyperlipidemia, unspecified: Secondary | ICD-10-CM

## 2019-09-13 MED ORDER — PRAVASTATIN SODIUM 40 MG PO TABS: 40.0000 mg | ORAL_TABLET | Freq: Every day | ORAL | 3 refills | Status: DC

## 2019-09-17 ENCOUNTER — Encounter: Payer: Self-pay | Admitting: Urology

## 2019-09-27 ENCOUNTER — Telehealth: Payer: Self-pay | Admitting: Internal Medicine

## 2019-09-27 ENCOUNTER — Encounter: Payer: Self-pay | Admitting: Internal Medicine

## 2019-09-27 NOTE — Telephone Encounter (Signed)
Left vm to offer apt/ letter sent.  left vm to offer apt" [place] said 21 days ago  "Rejection Reason - Patient did not respond - unable to contact letter was sent" [place] said 4 days ago  [facility] urology

## 2019-10-04 DIAGNOSIS — M722 Plantar fascial fibromatosis: Secondary | ICD-10-CM | POA: Diagnosis not present

## 2019-12-31 ENCOUNTER — Telehealth: Payer: Self-pay | Admitting: Internal Medicine

## 2019-12-31 NOTE — Telephone Encounter (Signed)
sch f/u for HTN on or before 03/07/20 Be fasting for labs as well  Thanks [place]

## 2020-01-24 DIAGNOSIS — Z01818 Encounter for other preprocedural examination: Secondary | ICD-10-CM | POA: Diagnosis not present

## 2020-01-24 DIAGNOSIS — Z800 Family history of malignant neoplasm of digestive organs: Secondary | ICD-10-CM | POA: Diagnosis not present

## 2020-01-24 DIAGNOSIS — Z8371 Family history of colonic polyps: Secondary | ICD-10-CM | POA: Diagnosis not present

## 2020-01-24 DIAGNOSIS — Z1211 Encounter for screening for malignant neoplasm of colon: Secondary | ICD-10-CM | POA: Diagnosis not present

## 2020-03-18 ENCOUNTER — Ambulatory Visit: Payer: BC Managed Care – PPO | Admitting: Internal Medicine

## 2020-04-20 ENCOUNTER — Encounter: Payer: Self-pay | Admitting: Internal Medicine

## 2020-04-28 ENCOUNTER — Telehealth: Payer: BC Managed Care – PPO | Admitting: Nurse Practitioner

## 2020-04-28 DIAGNOSIS — M545 Low back pain, unspecified: Secondary | ICD-10-CM

## 2020-04-28 MED ORDER — CYCLOBENZAPRINE HCL 10 MG PO TABS
10.0000 mg | ORAL_TABLET | Freq: Three times a day (TID) | ORAL | 0 refills | Status: DC | PRN
Start: 2020-04-28 — End: 2020-05-26

## 2020-04-28 MED ORDER — NAPROXEN 500 MG PO TABS
500.0000 mg | ORAL_TABLET | Freq: Two times a day (BID) | ORAL | 0 refills | Status: DC
Start: 2020-04-28 — End: 2021-04-30

## 2020-04-28 NOTE — Progress Notes (Signed)

## 2020-04-29 ENCOUNTER — Telehealth (INDEPENDENT_AMBULATORY_CARE_PROVIDER_SITE_OTHER): Payer: BC Managed Care – PPO | Admitting: Internal Medicine

## 2020-04-29 ENCOUNTER — Encounter: Payer: Self-pay | Admitting: Internal Medicine

## 2020-04-29 ENCOUNTER — Telehealth: Payer: Self-pay | Admitting: Internal Medicine

## 2020-04-29 ENCOUNTER — Other Ambulatory Visit: Payer: Self-pay

## 2020-04-29 VITALS — BP 120/87 | Ht 69.0 in | Wt 189.0 lb

## 2020-04-29 DIAGNOSIS — J449 Chronic obstructive pulmonary disease, unspecified: Secondary | ICD-10-CM | POA: Diagnosis not present

## 2020-04-29 DIAGNOSIS — F321 Major depressive disorder, single episode, moderate: Secondary | ICD-10-CM

## 2020-04-29 DIAGNOSIS — Z1389 Encounter for screening for other disorder: Secondary | ICD-10-CM

## 2020-04-29 DIAGNOSIS — Z125 Encounter for screening for malignant neoplasm of prostate: Secondary | ICD-10-CM

## 2020-04-29 DIAGNOSIS — F339 Major depressive disorder, recurrent, unspecified: Secondary | ICD-10-CM

## 2020-04-29 DIAGNOSIS — F419 Anxiety disorder, unspecified: Secondary | ICD-10-CM

## 2020-04-29 DIAGNOSIS — F32A Depression, unspecified: Secondary | ICD-10-CM

## 2020-04-29 DIAGNOSIS — I10 Essential (primary) hypertension: Secondary | ICD-10-CM

## 2020-04-29 DIAGNOSIS — D696 Thrombocytopenia, unspecified: Secondary | ICD-10-CM

## 2020-04-29 DIAGNOSIS — Z1329 Encounter for screening for other suspected endocrine disorder: Secondary | ICD-10-CM

## 2020-04-29 MED ORDER — LORAZEPAM 0.5 MG PO TABS: 0.5000 mg | ORAL_TABLET | Freq: Every day | ORAL | 2 refills | Status: DC | PRN

## 2020-04-29 MED ORDER — SERTRALINE HCL 100 MG PO TABS: 150.0000 mg | ORAL_TABLET | Freq: Every day | ORAL | 3 refills | Status: DC

## 2020-04-29 MED ORDER — BUPROPION HCL ER (XL) 150 MG PO TB24: 150.0000 mg | ORAL_TABLET | Freq: Every day | ORAL | 3 refills | Status: DC

## 2020-04-29 MED ORDER — LOSARTAN POTASSIUM 50 MG PO TABS: 50.0000 mg | ORAL_TABLET | Freq: Every day | ORAL | 3 refills | Status: DC

## 2020-04-29 MED ORDER — BUDESONIDE-FORMOTEROL FUMARATE 160-4.5 MCG/ACT IN AERO: 2.0000 | INHALATION_SPRAY | Freq: Two times a day (BID) | RESPIRATORY_TRACT | 12 refills | Status: DC

## 2020-04-29 NOTE — Progress Notes (Signed)
Telephone Note  I connected with Darrell Santiago  on 04/29/20 at  3:15 PM EST by telephone and verified that I am speaking with the correct person using two identifiers.  Location patient: home, [place] Location provider:work or home office Persons participating in the virtual visit: patient, provider  I discussed the limitations of evaluation and management by telemedicine and the availability of in person appointments. The patient expressed understanding and agreed to proceed.   HPI: 1. Worsening anxiety/depression PHQ 9 score 17 and GAD 7 score 10 for the last 6 months his bio son only son is not speaking to him since his dad died and they didn't agreeb on guns he is 55 y.o w/o kids and his wifes children will not let him see Cayden the 53 y.o gransdon (steop) they are both on speaker phone request referral to therapy given [facility] [place], [place] [facility] [place], [facility] Doy[facility] in [place] but will also check with insurance  He is still drinking "about the same amount of alcohol"  Has exp Rx ativan 0.5 mg qd prn  2. Low back pain/spasm seen yesterday given naproxyn and flexeril prn  3.  HTN on losartan 50 and norvasc 2.5 qd will check fasting labs ROS: See pertinent positives and negatives per HPI.  Past Medical History:  Diagnosis Date  . Abnormal EKG   . Allergy    Seasonal  . Anxiety   . Chicken pox   . Depression   . GERD (gastroesophageal reflux disease)   . Hyperlipidemia   . Hypertension   . Urine incontinence     Past Surgical History:  Procedure Laterality Date  . CHOLECYSTECTOMY  2000  . LEFT HEART CATH AND CORONARY ANGIOGRAPHY N/A 01/31/2017   Procedure: LEFT HEART CATH AND CORONARY ANGIOGRAPHY;  Surgeon: Dionisio David, MD;  Location: [facility];  Service: Cardiovascular;  Laterality: N/A;     Current Outpatient Medications:  .  acetaminophen (TYLENOL) 325 MG tablet, Take 2 tablets (650 mg total) by mouth every 6 (six) hours as needed for mild pain  (or Fever >/= 101)., Disp: , Rfl:  .  amLODipine (NORVASC) 2.5 MG tablet, Take 1 tablet (2.5 mg total) by mouth daily., Disp: 90 tablet, Rfl: 3 .  aspirin 81 MG chewable tablet, Chew by mouth., Disp: , Rfl:  .  buPROPion (WELLBUTRIN XL) 150 MG 24 hr tablet, Take 1 tablet (150 mg total) by mouth daily., Disp: 90 tablet, Rfl: 3 .  Multiple Vitamin (MULTI-VITAMIN) tablet, Take by mouth., Disp: , Rfl:  .  pantoprazole (PROTONIX) 20 MG tablet, Take 1 tablet (20 mg total) by mouth daily. 30 minutes before food, Disp: 90 tablet, Rfl: 3 .  pravastatin (PRAVACHOL) 40 MG tablet, Take 1 tablet (40 mg total) by mouth at bedtime., Disp: 90 tablet, Rfl: 3 .  vitamin B-12 (CYANOCOBALAMIN) 1000 MCG tablet, Take 1,000 mcg by mouth daily. , Disp: , Rfl:  .  budesonide-formoterol (SYMBICORT) 160-4.5 MCG/ACT inhaler, Inhale 2 puffs into the lungs 2 (two) times daily. Rinse mouth after use, Disp: 1 each, Rfl: 12 .  cyclobenzaprine (FLEXERIL) 10 MG tablet, Take 1 tablet (10 mg total) by mouth 3 (three) times daily as needed for muscle spasms. (Patient not taking: Reported on 04/29/2020), Disp: 30 tablet, Rfl: 0 .  ipratropium-albuterol (DUONEB) 0.5-2.5 (3) MG/3ML SOLN, Inhale into the lungs. (Patient not taking: Reported on 04/29/2020), Disp: , Rfl:  .  LORazepam (ATIVAN) 0.5 MG tablet, Take 1 tablet (0.5 mg total)  by mouth daily as needed for anxiety or sleep., Disp: 30 tablet, Rfl: 2 .  losartan (COZAAR) 50 MG tablet, Take 1 tablet (50 mg total) by mouth daily., Disp: 90 tablet, Rfl: 3 .  naproxen (NAPROSYN) 500 MG tablet, Take 1 tablet (500 mg total) by mouth 2 (two) times daily with a meal. (Patient not taking: Reported on 04/29/2020), Disp: 30 tablet, Rfl: 0 .  sertraline (ZOLOFT) 100 MG tablet, Take 1.5 tablets (150 mg total) by mouth daily. In am, Disp: 90 tablet, Rfl: 3  EXAM:  VITALS per patient if applicable:  GENERAL: alert, oriented, appears well and in no acute distress  PSYCH/NEURO: pleasant and  cooperative, no obvious depression or anxiety, speech and thought processing grossly intact  ASSESSMENT AND PLAN:  Discussed the following assessment and plan:  Anxiety and recurrent depression PHQ 9 score 17 and GAD 7 score 10- Plan: buPROPion (WELLBUTRIN XL) 150 MG 24 hr tablet Increase zoloft 100 mg qd to 150 mg qd prn ativan 0.5 mg qd prn  See list of therapist rec below pt to call to schedule and will refer if has issues with [facility] in [place], [facility] [place], [facility] in [place] but will also check with insurance    Chronic obstructive pulmonary disease, unspecified COPD type ([place]) - Plan: budesonide-formoterol (SYMBICORT) 160-4.5 MCG/ACT inhaler Smoking cessation rec   Essential hypertension - Plan: losartan (COZAAR) 50 MG tablet, Comprehensive metabolic panel, Lipid panel, CBC with Differential/Platelet norvasc 2.5 mg qd   Thrombocytopenia ([place]) - Plan: CBC with Differential/Platelet, Pathologist smear review  HM Declines flu shot, pna 23  Tdap UTD twinrix3/3 utd -repeat Hep A/B titer with labs in future  rec smoking cessation pt has cut back  referred colonscopy [place] pt wants outpt colonoscopy PSAhad 08/27/18 0.80 -referred urology  -will need PSA and DRE with urology   -we discussed possible serious and likely etiologies, options for evaluation and workup, limitations of telemedicine visit vs in person visit, treatment, treatment risks and precautions.   I discussed the assessment and treatment plan with the patient. The patient was provided an opportunity to ask questions and all were answered. The patient agreed with the plan and demonstrated an understanding of the instructions.    Time spent 20 min Delorise Jackson, MD

## 2020-04-29 NOTE — Telephone Encounter (Signed)
Calling Patient to start 3:00, No answer, no voicemail.    Called at 2:50 and Patient answered.

## 2020-05-10 DIAGNOSIS — R410 Disorientation, unspecified: Secondary | ICD-10-CM | POA: Diagnosis not present

## 2020-05-10 DIAGNOSIS — Z9049 Acquired absence of other specified parts of digestive tract: Secondary | ICD-10-CM | POA: Diagnosis not present

## 2020-05-10 DIAGNOSIS — I2510 Atherosclerotic heart disease of native coronary artery without angina pectoris: Secondary | ICD-10-CM | POA: Diagnosis not present

## 2020-05-10 DIAGNOSIS — I444 Left anterior fascicular block: Secondary | ICD-10-CM | POA: Diagnosis not present

## 2020-05-10 DIAGNOSIS — R45851 Suicidal ideations: Secondary | ICD-10-CM | POA: Diagnosis not present

## 2020-05-10 DIAGNOSIS — E785 Hyperlipidemia, unspecified: Secondary | ICD-10-CM | POA: Diagnosis not present

## 2020-05-10 DIAGNOSIS — Z720 Tobacco use: Secondary | ICD-10-CM | POA: Diagnosis not present

## 2020-05-10 DIAGNOSIS — E876 Hypokalemia: Secondary | ICD-10-CM | POA: Diagnosis not present

## 2020-05-10 DIAGNOSIS — K219 Gastro-esophageal reflux disease without esophagitis: Secondary | ICD-10-CM | POA: Diagnosis not present

## 2020-05-10 DIAGNOSIS — I252 Old myocardial infarction: Secondary | ICD-10-CM | POA: Diagnosis not present

## 2020-05-10 DIAGNOSIS — R441 Visual hallucinations: Secondary | ICD-10-CM | POA: Diagnosis not present

## 2020-05-10 DIAGNOSIS — Z20822 Contact with and (suspected) exposure to covid-19: Secondary | ICD-10-CM | POA: Diagnosis not present

## 2020-05-10 DIAGNOSIS — I10 Essential (primary) hypertension: Secondary | ICD-10-CM | POA: Diagnosis not present

## 2020-05-10 DIAGNOSIS — R9089 Other abnormal findings on diagnostic imaging of central nervous system: Secondary | ICD-10-CM | POA: Diagnosis not present

## 2020-05-10 DIAGNOSIS — F1020 Alcohol dependence, uncomplicated: Secondary | ICD-10-CM | POA: Diagnosis not present

## 2020-05-10 DIAGNOSIS — F05 Delirium due to known physiological condition: Secondary | ICD-10-CM | POA: Diagnosis not present

## 2020-05-10 DIAGNOSIS — R440 Auditory hallucinations: Secondary | ICD-10-CM | POA: Diagnosis not present

## 2020-05-10 DIAGNOSIS — F419 Anxiety disorder, unspecified: Secondary | ICD-10-CM | POA: Diagnosis not present

## 2020-05-10 DIAGNOSIS — F329 Major depressive disorder, single episode, unspecified: Secondary | ICD-10-CM | POA: Diagnosis not present

## 2020-05-10 DIAGNOSIS — E7800 Pure hypercholesterolemia, unspecified: Secondary | ICD-10-CM | POA: Diagnosis not present

## 2020-05-10 DIAGNOSIS — F10220 Alcohol dependence with intoxication, uncomplicated: Secondary | ICD-10-CM | POA: Diagnosis not present

## 2020-05-10 DIAGNOSIS — F17210 Nicotine dependence, cigarettes, uncomplicated: Secondary | ICD-10-CM | POA: Diagnosis not present

## 2020-05-10 DIAGNOSIS — F1994 Other psychoactive substance use, unspecified with psychoactive substance-induced mood disorder: Secondary | ICD-10-CM | POA: Diagnosis not present

## 2020-05-10 DIAGNOSIS — Z9119 Patient's noncompliance with other medical treatment and regimen: Secondary | ICD-10-CM | POA: Diagnosis not present

## 2020-05-10 DIAGNOSIS — I452 Bifascicular block: Secondary | ICD-10-CM | POA: Diagnosis not present

## 2020-05-10 DIAGNOSIS — F1024 Alcohol dependence with alcohol-induced mood disorder: Secondary | ICD-10-CM | POA: Diagnosis not present

## 2020-05-13 ENCOUNTER — Telehealth: Payer: Self-pay

## 2020-05-13 NOTE — Telephone Encounter (Signed)
Spoke with Patient's wife and she wanted to know about scheduling Patient a follow up with Dr Olivia Mackie McLean-Scocuzza. Informed her that once he has been discharged from the hospital they may call and have him scheduled for a hospital follow up visit.   Patient's wife wanted to know if we could pull the Patient's information up from the hospital. I informed her that once they finish their evaluation of her husband in the hospital they will send a summary to Dr Olivia Mackie McLean-Scocuzza but that we can not pull what they are currently doing in the hospital. She verbalized understanding.

## 2020-05-13 NOTE — Telephone Encounter (Signed)
Pt would like Dr. Audrie Gallus nurse to discuss what is going on with her husbands. She has some concerns about what she needs to do. She said pt has been in hospital at [facility] since Sunday and they aren't telling her anything.

## 2020-05-22 ENCOUNTER — Other Ambulatory Visit (INDEPENDENT_AMBULATORY_CARE_PROVIDER_SITE_OTHER): Payer: BC Managed Care – PPO

## 2020-05-22 ENCOUNTER — Other Ambulatory Visit: Payer: Self-pay

## 2020-05-22 DIAGNOSIS — I10 Essential (primary) hypertension: Secondary | ICD-10-CM

## 2020-05-22 DIAGNOSIS — Z1389 Encounter for screening for other disorder: Secondary | ICD-10-CM

## 2020-05-22 DIAGNOSIS — Z1329 Encounter for screening for other suspected endocrine disorder: Secondary | ICD-10-CM

## 2020-05-22 DIAGNOSIS — Z125 Encounter for screening for malignant neoplasm of prostate: Secondary | ICD-10-CM | POA: Diagnosis not present

## 2020-05-22 DIAGNOSIS — D696 Thrombocytopenia, unspecified: Secondary | ICD-10-CM

## 2020-05-22 LAB — COMPREHENSIVE METABOLIC PANEL
ALT: 35 U/L (ref 0–53)
AST: 38 U/L — ABNORMAL HIGH (ref 0–37)
Albumin: 4.4 g/dL (ref 3.5–5.2)
Alkaline Phosphatase: 91 U/L (ref 39–117)
BUN: 12 mg/dL (ref 6–23)
CO2: 32 mEq/L (ref 19–32)
Calcium: 9.8 mg/dL (ref 8.4–10.5)
Chloride: 103 mEq/L (ref 96–112)
Creatinine, Ser: 0.68 mg/dL (ref 0.40–1.50)
GFR: 106.7 mL/min (ref >60.00)
Glucose, Bld: 88 mg/dL (ref 70–99)
Potassium: 4.1 mEq/L (ref 3.5–5.1)
Sodium: 142 mEq/L (ref 135–145)
Total Bilirubin: 0.5 mg/dL (ref 0.2–1.2)
Total Protein: 6.8 g/dL (ref 6.0–8.3)

## 2020-05-22 LAB — PSA: PSA: 0.67 ng/mL (ref 0.10–4.00)

## 2020-05-22 LAB — LIPID PANEL
Cholesterol: 216 mg/dL — ABNORMAL HIGH (ref 0–200)
HDL: 80.7 mg/dL (ref >39.00)
LDL Cholesterol: 118 mg/dL — ABNORMAL HIGH (ref 0–99)
NonHDL: 135.29
Total CHOL/HDL Ratio: 3
Triglycerides: 87 mg/dL (ref 0.0–149.0)
VLDL: 17.4 mg/dL (ref 0.0–40.0)

## 2020-05-22 LAB — CBC WITH DIFFERENTIAL/PLATELET
Basophils Absolute: 0.1 10*3/uL (ref 0.0–0.1)
Basophils Relative: 1.1 % (ref 0.0–3.0)
Eosinophils Absolute: 0.2 10*3/uL (ref 0.0–0.7)
Eosinophils Relative: 3.2 % (ref 0.0–5.0)
HCT: 45.4 % (ref 39.0–52.0)
Hemoglobin: 15.4 g/dL (ref 13.0–17.0)
Lymphocytes Relative: 26.8 % (ref 12.0–46.0)
Lymphs Abs: 1.3 10*3/uL (ref 0.7–4.0)
MCHC: 34 g/dL (ref 30.0–36.0)
MCV: 101.9 fl — ABNORMAL HIGH (ref 78.0–100.0)
Monocytes Absolute: 0.5 10*3/uL (ref 0.1–1.0)
Monocytes Relative: 10.8 % (ref 3.0–12.0)
Neutro Abs: 2.9 10*3/uL (ref 1.4–7.7)
Neutrophils Relative %: 58.1 % (ref 43.0–77.0)
Platelets: 221 10*3/uL (ref 150.0–400.0)
RBC: 4.46 Mil/uL (ref 4.22–5.81)
RDW: 14.6 % (ref 11.5–15.5)
WBC: 5 10*3/uL (ref 4.0–10.5)

## 2020-05-22 LAB — TSH: TSH: 1.73 u[IU]/mL (ref 0.35–4.50)

## 2020-05-22 NOTE — Addendum Note (Signed)
Addended by: Leeanne Rio on: 05/22/2020 08:11 AM   Modules accepted: Orders

## 2020-05-26 ENCOUNTER — Inpatient Hospital Stay: Payer: BC Managed Care – PPO | Admitting: Internal Medicine

## 2020-05-26 ENCOUNTER — Encounter: Payer: Self-pay | Admitting: Internal Medicine

## 2020-05-26 ENCOUNTER — Telehealth (INDEPENDENT_AMBULATORY_CARE_PROVIDER_SITE_OTHER): Payer: BC Managed Care – PPO | Admitting: Internal Medicine

## 2020-05-26 ENCOUNTER — Other Ambulatory Visit: Payer: Self-pay

## 2020-05-26 VITALS — Ht 69.0 in | Wt 190.0 lb

## 2020-05-26 DIAGNOSIS — J320 Chronic maxillary sinusitis: Secondary | ICD-10-CM | POA: Diagnosis not present

## 2020-05-26 DIAGNOSIS — I10 Essential (primary) hypertension: Secondary | ICD-10-CM

## 2020-05-26 DIAGNOSIS — F1024 Alcohol dependence with alcohol-induced mood disorder: Secondary | ICD-10-CM | POA: Diagnosis not present

## 2020-05-26 DIAGNOSIS — G319 Degenerative disease of nervous system, unspecified: Secondary | ICD-10-CM | POA: Diagnosis not present

## 2020-05-26 DIAGNOSIS — R748 Abnormal levels of other serum enzymes: Secondary | ICD-10-CM

## 2020-05-26 DIAGNOSIS — Z8659 Personal history of other mental and behavioral disorders: Secondary | ICD-10-CM

## 2020-05-26 DIAGNOSIS — F419 Anxiety disorder, unspecified: Secondary | ICD-10-CM

## 2020-05-26 DIAGNOSIS — F339 Major depressive disorder, recurrent, unspecified: Secondary | ICD-10-CM

## 2020-05-26 MED ORDER — AMLODIPINE BESYLATE 2.5 MG PO TABS: 2.5000 mg | ORAL_TABLET | Freq: Every day | ORAL | 3 refills | Status: DC

## 2020-05-26 NOTE — Progress Notes (Addendum)
Virtual Visit via Video Note  I connected with Clinical research associate   on 05/26/20 at  3:00 PM EST by a video enabled telemedicine application and verified that I am speaking with the correct person using two identifiers.  Location patient: home, [place] Location provider:work or home office Persons participating in the virtual visit: patient, provider pts wife Epifanio Lesches   I discussed the limitations of evaluation and management by telemedicine and the availability of in person appointments. The patient expressed understanding and agreed to proceed.   HPI: 1. psychatric admission 1/23-1/28/22 needs fmla paperwork and for intermittent leave went back to work 05/18/20 was hospitalized due to alcohol dependence/delirium/SI no longer SI, also lfts increased 50s-90s ast 05/10/20 90 and alk phos 127 and now ast 38 normal he reports drinking less   Of note they cut back zoloft ot 50 mg qd from 150 stopped wellbutrin 150 xl, stopped flexeril, rec stop ativan 0.5 add hydroxyzine 25 prn thought SSRI causing increased lfts and also alcohol   Wife tried to call [facility] [place] for therapy no return call [facility]   ROS: See pertinent positives and negatives per HPI.  Past Medical History:  Diagnosis Date  . Abnormal EKG   . Allergy    Seasonal  . Anxiety   . Chicken pox   . Depression   . GERD (gastroesophageal reflux disease)   . Hyperlipidemia   . Hypertension   . Urine incontinence     Past Surgical History:  Procedure Laterality Date  . CHOLECYSTECTOMY  2000  . LEFT HEART CATH AND CORONARY ANGIOGRAPHY N/A 01/31/2017   Procedure: LEFT HEART CATH AND CORONARY ANGIOGRAPHY;  Surgeon: Dionisio David, MD;  Location: [facility];  Service: Cardiovascular;  Laterality: N/A;     Current Outpatient Medications:  .  acetaminophen (TYLENOL) 325 MG tablet, Take 2 tablets (650 mg total) by mouth every 6 (six) hours as needed for mild pain (or Fever >/= 101)., Disp: , Rfl:  .  aspirin 81 MG chewable tablet, Chew  by mouth., Disp: , Rfl:  .  budesonide-formoterol (SYMBICORT) 160-4.5 MCG/ACT inhaler, Inhale 2 puffs into the lungs 2 (two) times daily. Rinse mouth after use, Disp: 1 each, Rfl: 12 .  hydrOXYzine (ATARAX/VISTARIL) 25 MG tablet, Take 25 mg by mouth daily as needed., Disp: , Rfl:  .  LORazepam (ATIVAN) 0.5 MG tablet, Take 1 tablet (0.5 mg total) by mouth daily as needed for anxiety or sleep., Disp: 30 tablet, Rfl: 2 .  losartan (COZAAR) 50 MG tablet, Take 1 tablet (50 mg total) by mouth daily., Disp: 90 tablet, Rfl: 3 .  Multiple Vitamin (MULTI-VITAMIN) tablet, Take by mouth., Disp: , Rfl:  .  naproxen (NAPROSYN) 500 MG tablet, Take 1 tablet (500 mg total) by mouth 2 (two) times daily with a meal., Disp: 30 tablet, Rfl: 0 .  pantoprazole (PROTONIX) 20 MG tablet, Take 1 tablet (20 mg total) by mouth daily. 30 minutes before food, Disp: 90 tablet, Rfl: 3 .  pravastatin (PRAVACHOL) 40 MG tablet, Take 1 tablet (40 mg total) by mouth at bedtime., Disp: 90 tablet, Rfl: 3 .  sertraline (ZOLOFT) 100 MG tablet, Take 50 mg by mouth daily at 12 noon., Disp: , Rfl:  .  vitamin B-12 (CYANOCOBALAMIN) 1000 MCG tablet, Take 1,000 mcg by mouth daily. , Disp: , Rfl:  .  amLODipine (NORVASC) 2.5 MG tablet, Take 1-2 tablets (2.5-5 mg total) by mouth daily., Disp: 90 tablet, Rfl: 3 .  ipratropium-albuterol (DUONEB) 0.5-2.5 (  3) MG/3ML SOLN, Inhale into the lungs. (Patient not taking: No sig reported), Disp: , Rfl:   EXAM:-no video seen   VITALS per patient if applicable:  GENERAL: alert, oriented, appears well and in no acute distress   PSYCH/NEURO: pleasant and cooperative, no obvious depression or anxiety, speech and thought processing grossly intact  ASSESSMENT AND PLAN:  Discussed the following assessment and plan:  Alcohol dependence with alcohol-induced mood disorder ([place]) with h/o SI/moderate brain atrophy depression/anxiety and elevated lfts Rec etoh cessation- Plan: Ambulatory referral to Psychiatry,  Ambulatory referral to Psychology 05/11/20 CT brain  FINDINGS:  Moderate diffuse cerebral parenchymal volume loss. Midline cerebellar parenchymal volume loss.   There is no midline shift. No mass lesion. There is no evidence of acute infarct. No acute intracranial hemorrhage. No fractures are evident. Partially opacified bilateral maxillary sinuses.   IMPRESSION:  No acute intracranial abnormality. Diffuse cerebral parenchymal volume loss and midline cerebellar parenchymal volume loss.  Chronic maxillary sinusitis Consider ENT in the future   Essential hypertension - Plan: amLODipine (NORVASC) 2.5 MG tablet taking 2.5 or 5 mg daily  And losartan 50 mg qd  BP was 119/85 on 05/15/20 hospital discharge    HM Declines flu shot, pna 23  Tdap UTD twinrix3/3utd -repeat Hep A/B titer with labs in future  rec smoking cessation pt has cut back referredcolonscopy [place] pt wants outpt colonoscopy -consider in the future   PSA0.67 normal 05/22/20   -we discussed possible serious and likely etiologies, options for evaluation and workup, limitations of telemedicine visit vs in person visit, treatment, treatment risks and precautions     I discussed the assessment and treatment plan with the patient. The patient was provided an opportunity to ask questions and all were answered. The patient agreed with the plan and demonstrated an understanding of the instructions.    Time spent 20 min Delorise Jackson, MD

## 2020-05-26 NOTE — Patient Instructions (Signed)
[facility] [place]  [address] [place] [address] [phone]   [facility] [place]  [address]  [place] [address]  [phone] 

## 2020-05-27 LAB — PATHOLOGIST SMEAR REVIEW
Basophils Absolute: 0.1 10*3/uL (ref 0.0–0.2)
Basos: 2 %
EOS (ABSOLUTE): 0.1 10*3/uL (ref 0.0–0.4)
Eos: 3 %
Hematocrit: 46.2 % (ref 37.5–51.0)
Hemoglobin: 15.4 g/dL (ref 13.0–17.7)
Immature Grans (Abs): 0 10*3/uL (ref 0.0–0.1)
Immature Granulocytes: 0 %
Lymphocytes Absolute: 1.4 10*3/uL (ref 0.7–3.1)
Lymphs: 29 %
MCH: 34.1 pg — ABNORMAL HIGH (ref 26.6–33.0)
MCHC: 33.3 g/dL (ref 31.5–35.7)
MCV: 102 fL — ABNORMAL HIGH (ref 79–97)
Monocytes Absolute: 0.2 10*3/uL (ref 0.1–0.9)
Monocytes: 5 %
Neutrophils Absolute: 3.1 10*3/uL (ref 1.4–7.0)
Neutrophils: 61 %
Path Rev PLTs: NORMAL
Path Rev WBC: NORMAL
Platelets: 233 10*3/uL (ref 150–450)
RBC: 4.52 x10E6/uL (ref 4.14–5.80)
RDW: 12 % (ref 11.6–15.4)
WBC: 5 10*3/uL (ref 3.4–10.8)

## 2020-05-27 LAB — URINALYSIS, ROUTINE W REFLEX MICROSCOPIC

## 2020-05-29 DIAGNOSIS — Z0279 Encounter for issue of other medical certificate: Secondary | ICD-10-CM

## 2020-05-29 DIAGNOSIS — Z8659 Personal history of other mental and behavioral disorders: Secondary | ICD-10-CM | POA: Insufficient documentation

## 2020-06-02 ENCOUNTER — Telehealth: Payer: Self-pay

## 2020-06-02 NOTE — Telephone Encounter (Signed)
Faxed disability and FMLA medical certification to [employer] @ [phone] and [phone] on 06/02/20

## 2020-06-10 NOTE — Telephone Encounter (Signed)
Faxed again to both numbers on 06/10/20

## 2020-06-16 ENCOUNTER — Ambulatory Visit (INDEPENDENT_AMBULATORY_CARE_PROVIDER_SITE_OTHER): Payer: BC Managed Care – PPO | Admitting: Psychologist

## 2020-06-16 DIAGNOSIS — F321 Major depressive disorder, single episode, moderate: Secondary | ICD-10-CM

## 2020-06-16 DIAGNOSIS — F411 Generalized anxiety disorder: Secondary | ICD-10-CM | POA: Diagnosis not present

## 2020-06-19 ENCOUNTER — Telehealth: Payer: Self-pay | Admitting: Internal Medicine

## 2020-06-19 NOTE — Telephone Encounter (Signed)
lft vm for pt to call ofc its regarding pt referral to [facility]. Thanks

## 2020-06-25 ENCOUNTER — Encounter: Payer: Self-pay | Admitting: Internal Medicine

## 2020-06-25 ENCOUNTER — Telehealth: Payer: Self-pay | Admitting: Internal Medicine

## 2020-06-25 NOTE — Telephone Encounter (Signed)
FYI I called patient's wife & she stated that she would call [employer] to see if specifically separate paperwork needed to be filled out for intermittent leave. She will call back office tomorrow to let us know.

## 2020-06-25 NOTE — Telephone Encounter (Signed)
Why does he need FMLA paperwork filled out again   If this is a new reason for paperwork we need to schedule an appointment we have a 10:30, 2:30 and 3:30 today   I will have the nurse call to speak to you   I filled out his last paperwork for intermittent leave for appointments

## 2020-06-30 ENCOUNTER — Ambulatory Visit (INDEPENDENT_AMBULATORY_CARE_PROVIDER_SITE_OTHER): Payer: BC Managed Care – PPO | Admitting: Psychologist

## 2020-06-30 DIAGNOSIS — F411 Generalized anxiety disorder: Secondary | ICD-10-CM

## 2020-06-30 DIAGNOSIS — F321 Major depressive disorder, single episode, moderate: Secondary | ICD-10-CM

## 2020-07-15 ENCOUNTER — Telehealth: Payer: Self-pay | Admitting: Internal Medicine

## 2020-07-15 ENCOUNTER — Ambulatory Visit: Payer: BC Managed Care – PPO | Admitting: Internal Medicine

## 2020-07-15 ENCOUNTER — Other Ambulatory Visit: Payer: Self-pay

## 2020-07-15 ENCOUNTER — Encounter: Payer: Self-pay | Admitting: Internal Medicine

## 2020-07-15 VITALS — BP 132/84 | HR 87 | Temp 98.0°F | Ht 69.0 in | Wt 190.4 lb

## 2020-07-15 DIAGNOSIS — H6123 Impacted cerumen, bilateral: Secondary | ICD-10-CM

## 2020-07-15 DIAGNOSIS — Z0000 Encounter for general adult medical examination without abnormal findings: Secondary | ICD-10-CM | POA: Diagnosis not present

## 2020-07-15 DIAGNOSIS — J320 Chronic maxillary sinusitis: Secondary | ICD-10-CM

## 2020-07-15 DIAGNOSIS — R221 Localized swelling, mass and lump, neck: Secondary | ICD-10-CM

## 2020-07-15 DIAGNOSIS — F17200 Nicotine dependence, unspecified, uncomplicated: Secondary | ICD-10-CM

## 2020-07-15 DIAGNOSIS — L720 Epidermal cyst: Secondary | ICD-10-CM

## 2020-07-15 DIAGNOSIS — F339 Major depressive disorder, recurrent, unspecified: Secondary | ICD-10-CM

## 2020-07-15 DIAGNOSIS — Z1211 Encounter for screening for malignant neoplasm of colon: Secondary | ICD-10-CM

## 2020-07-15 DIAGNOSIS — I10 Essential (primary) hypertension: Secondary | ICD-10-CM

## 2020-07-15 DIAGNOSIS — E785 Hyperlipidemia, unspecified: Secondary | ICD-10-CM

## 2020-07-15 DIAGNOSIS — K219 Gastro-esophageal reflux disease without esophagitis: Secondary | ICD-10-CM

## 2020-07-15 DIAGNOSIS — F419 Anxiety disorder, unspecified: Secondary | ICD-10-CM

## 2020-07-15 DIAGNOSIS — J449 Chronic obstructive pulmonary disease, unspecified: Secondary | ICD-10-CM

## 2020-07-15 DIAGNOSIS — F1024 Alcohol dependence with alcohol-induced mood disorder: Secondary | ICD-10-CM

## 2020-07-15 MED ORDER — LOSARTAN POTASSIUM 50 MG PO TABS: 50.0000 mg | ORAL_TABLET | Freq: Every day | ORAL | 3 refills | Status: DC

## 2020-07-15 MED ORDER — BUDESONIDE-FORMOTEROL FUMARATE 160-4.5 MCG/ACT IN AERO: 2.0000 | INHALATION_SPRAY | Freq: Two times a day (BID) | RESPIRATORY_TRACT | 12 refills | Status: DC

## 2020-07-15 MED ORDER — PRAVASTATIN SODIUM 40 MG PO TABS: 40.0000 mg | ORAL_TABLET | Freq: Every day | ORAL | 3 refills | Status: DC

## 2020-07-15 MED ORDER — LORAZEPAM 0.5 MG PO TABS: 0.5000 mg | ORAL_TABLET | Freq: Every day | ORAL | 2 refills | Status: DC | PRN

## 2020-07-15 MED ORDER — AMLODIPINE BESYLATE 2.5 MG PO TABS: 2.5000 mg | ORAL_TABLET | Freq: Every day | ORAL | 3 refills | Status: DC

## 2020-07-15 MED ORDER — SERTRALINE HCL 50 MG PO TABS: 50.0000 mg | ORAL_TABLET | Freq: Every day | ORAL | 3 refills | Status: DC

## 2020-07-15 MED ORDER — PANTOPRAZOLE SODIUM 20 MG PO TBEC
20.0000 mg | DELAYED_RELEASE_TABLET | Freq: Every day | ORAL | 3 refills | Status: DC
Start: 2020-07-15 — End: 2021-02-16
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
## 2020-07-15 NOTE — Patient Instructions (Addendum)
Make sure colonoscopy on Friday-Dr. Havery Moros [facility] in [facility] call for appt  [place]  [place] [phone]  Call to schedule an appointment telephone, video or in person  No referral needed you call to schedule appointment   [facility]  [place] [address] [phone]   [facility] [place]  [address]  [place] [address]  [phone]   Partially opacified bilateral maxillary sinuses.  IMPRESSION:  No acute intracranial abnormality. Diffuse cerebral parenchymal volume loss and midline cerebellar parenchymal volume loss. Chronic maxillary sinusitis Consider ENT in the future let me know when ready for referral and TO CLEAN EARS   Lipoma  A lipoma is a noncancerous (benign) tumor that is made up of fat cells. This is a very common type of soft-tissue growth. Lipomas are usually found under the skin (subcutaneous). They may occur in any tissue of the body that contains fat. Common areas for lipomas to appear include the back, arms, shoulders, buttocks, and thighs. Lipomas grow slowly, and they are usually painless. Most lipomas do not cause problems and do not require treatment. What are the causes? The cause of this condition is not known. What increases the risk? You are more likely to develop this condition if:  You are 30-2 years old.  You have a family history of lipomas. What are the signs or symptoms? A lipoma usually appears as a small, round bump under the skin. In most cases, the lump will:  Feel soft or rubbery.  Not cause pain or other symptoms. However, if a lipoma is located in an area where it pushes on nerves, it can become painful or cause other symptoms. How is this diagnosed? A lipoma can usually be diagnosed with a physical exam. You may also have tests to confirm the diagnosis and to rule out other conditions. Tests may  include:  Imaging tests, such as a CT scan or an MRI.  Removal of a tissue sample to be looked at under a microscope (biopsy). How is this treated? Treatment for this condition depends on the size of the lipoma and whether it is causing any symptoms.  For small lipomas that are not causing problems, no treatment is needed.  If a lipoma is bigger or it causes problems, surgery may be done to remove the lipoma. Lipomas can also be removed to improve appearance. Most often, the procedure is done after applying a medicine that numbs the area (local anesthetic).  Liposuction may be done to reduce the size of the lipoma before it is removed through surgery, or it may be done to remove the lipoma. Lipomas are removed with this method in order to limit incision size and scarring. A liposuction tube is inserted through a small incision into the lipoma, and the contents of the lipoma are removed through the tube with suction. Follow these instructions at home:  Watch your lipoma for any changes.  Keep all follow-up visits as told by your health care provider. This is important. Contact a health care provider if:  Your lipoma becomes larger or hard.  Your lipoma becomes painful, red, or increasingly swollen. These could be signs of infection or a more serious condition. Get help right away if:  You develop tingling or numbness in an area near the lipoma. This could indicate that the lipoma is causing nerve damage. Summary  A lipoma is a noncancerous tumor that  is made up of fat cells.  Most lipomas do not cause problems and do not require treatment.  If a lipoma is bigger or it causes problems, surgery may be done to remove the lipoma.  Contact a health care provider if your lipoma becomes larger or hard, or if it becomes painful, red, or increasingly swollen. Pain, redness, and swelling could be signs of infection or a more serious condition. This information is not intended to replace  advice given to you by your health care provider. Make sure you discuss any questions you have with your health care provider. Document Revised: 11/19/2018 Document Reviewed: 11/19/2018 Elsevier Patient Education  [place].

## 2020-07-15 NOTE — Telephone Encounter (Signed)
lft vm for pt to call ofc to sch [place] head and neck. Thanks

## 2020-07-16 ENCOUNTER — Ambulatory Visit: Payer: Self-pay | Admitting: Psychologist

## 2020-07-17 ENCOUNTER — Telehealth: Payer: Self-pay

## 2020-07-17 NOTE — Telephone Encounter (Signed)
Faxed FMLA paperwork filled out by PCP to [employer] at [phone] on 07/16/2020.

## 2020-07-30 ENCOUNTER — Ambulatory Visit: Payer: BC Managed Care – PPO | Admitting: Internal Medicine

## 2020-08-18 ENCOUNTER — Ambulatory Visit: Payer: BC Managed Care – PPO

## 2020-09-10 ENCOUNTER — Ambulatory Visit
Admission: RE | Admit: 2020-09-10 | Discharge: 2020-09-10 | Disposition: A | Discharge status: Home / Self Care | Payer: BC Managed Care – PPO | Source: Ambulatory Visit | Attending: Internal Medicine | Admitting: Internal Medicine

## 2020-09-10 ENCOUNTER — Other Ambulatory Visit: Payer: Self-pay

## 2020-09-10 DIAGNOSIS — R221 Localized swelling, mass and lump, neck: Secondary | ICD-10-CM | POA: Insufficient documentation

## 2020-09-10 DIAGNOSIS — R220 Localized swelling, mass and lump, head: Secondary | ICD-10-CM | POA: Diagnosis not present

## 2020-09-17 NOTE — Addendum Note (Signed)
Addended by: Orland Mustard on: 09/17/2020 02:32 PM   Modules accepted: Orders

## 2020-09-25 ENCOUNTER — Other Ambulatory Visit: Payer: Self-pay

## 2020-09-25 ENCOUNTER — Ambulatory Visit: Payer: BC Managed Care – PPO | Admitting: Dermatology

## 2020-09-25 DIAGNOSIS — L720 Epidermal cyst: Secondary | ICD-10-CM | POA: Diagnosis not present

## 2020-09-25 NOTE — Patient Instructions (Signed)
Pre-Operative Instructions  You are scheduled for a surgical procedure at [facility]. We recommend you read the following instructions. If you have any questions or concerns, please call the office at [phone].  Shower and wash the entire body with soap and water the day of your surgery paying special attention to cleansing at and around the planned surgery site.  Avoid aspirin or aspirin containing products at least fourteen (14) days prior to your surgical procedure and for at least one week (7 Days) after your surgical procedure. If you take aspirin on a regular basis for heart disease or history of stroke or for any other reason, we may recommend you continue taking aspirin but please notify us if you take this on a regular basis. Aspirin can cause more bleeding to occur during surgery as well as prolonged bleeding and bruising after surgery.   Avoid other nonsteroidal pain medications at least one week prior to surgery and at least one week prior to your surgery. These include medications such as Ibuprofen (Motrin, Advil and Nuprin), Naprosyn, Voltaren, Relafen, etc. If medications are used for therapeutic reasons, please inform us as they can cause increased bleeding or prolonged bleeding during and bruising after surgical procedures.   Please advise [place] if you are taking any "blood thinner" medications such as Coumadin or Dipyridamole or Plavix or similar medications. These cause increased bleeding and prolonged bleeding during procedures and bruising after surgical procedures. We may have to consider discontinuing these medications briefly prior to and shortly after your surgery if safe to do so.   Please inform us of all medications you are currently taking. All medications that are taken regularly should be taken the day of surgery as you always do. Nevertheless, we need to be informed of what medications you are taking prior to surgery to know whether they will affect the  procedure or cause any complications.   Please inform us of any medication allergies. Also inform us of whether you have allergies to Latex or rubber products or whether you have had any adverse reaction to Lidocaine or Epinephrine.  Please inform us of any prosthetic or artificial body parts such as artificial heart valve, joint replacements, etc., or similar condition that might require preoperative antibiotics.   We recommend avoidance of alcohol at least two weeks prior to surgery and continued avoidance for at least two weeks after surgery.   We recommend discontinuation of tobacco smoking at least two weeks prior to surgery and continued abstinence for at least two weeks after surgery.  Do not plan strenuous exercise, strenuous work or strenuous lifting for approximately four weeks after your surgery.   We request if you are unable to make your scheduled surgical appointment, please call us at least a week in advance or as soon as you are aware of a problem so that we can cancel or reschedule the appointment.   You MAY TAKE TYLENOL (acetaminophen) for pain as it is not a blood thinner.   PLEASE PLAN TO BE IN TOWN FOR TWO WEEKS FOLLOWING SURGERY, THIS IS IMPORTANT SO YOU CAN BE CHECKED FOR DRESSING CHANGES, SUTURE REMOVAL AND TO MONITOR FOR POSSIBLE COMPLICATIONS.    If you have any questions or concerns for your doctor, please call our main line at [phone] and press option 4 to reach your doctor's medical assistant. If no one answers, please leave a voicemail as directed and we will return your call as soon as possible. Messages left after 4 pm will  be answered the following business day.   You may also send [place] a message via [place]. We typically respond to MyChart messages within 1-2 business days.  For prescription refills, please ask your pharmacy to contact our office. Our fax number is [phone].  If you have an urgent issue when the clinic is closed that cannot wait until  the next business day, you can page your doctor at the number below.    Please note that while we do our best to be available for urgent issues outside of office hours, we are not available 24/7.   If you have an urgent issue and are unable to reach [place], you may choose to seek medical care at your doctor's office, retail clinic, urgent care center, or emergency room.  If you have a medical emergency, please immediately call 911 or go to the emergency department.  Pager Numbers  - Dr. Nehemiah Massed: [phone]  - Dr. Laurence Ferrari: [phone]  - Dr. Nicole Kindred: [phone]  In the event of inclement weather, please call our main line at [phone] for an update on the status of any delays or closures.  Dermatology Medication Tips: Please keep the boxes that topical medications come in in order to help keep track of the instructions about where and how to use these. Pharmacies typically print the medication instructions only on the boxes and not directly on the medication tubes.   If your medication is too expensive, please contact our office at [phone] option 4 or send [place] a message through [place].   We are unable to tell what your co-pay for medications will be in advance as this is different depending on your insurance coverage. However, we may be able to find a substitute medication at lower cost or fill out paperwork to get insurance to cover a needed medication.   If a prior authorization is required to get your medication covered by your insurance company, please allow [place] 1-2 business days to complete this process.  Drug prices often vary depending on where the prescription is filled and some pharmacies may offer cheaper prices.  The website www.goodrx.com contains coupons for medications through different pharmacies. The prices here do not account for what the cost may be with help from insurance (it may be cheaper with your insurance), but the website can give you the price if you did  not use any insurance.  - You can print the associated coupon and take it with your prescription to the pharmacy.  - You may also stop by our office during regular business hours and pick up a GoodRx coupon card.  - If you need your prescription sent electronically to a different pharmacy, notify our office through [facility] or by phone at [phone] option 4.

## 2020-09-25 NOTE — Progress Notes (Signed)
   New Patient Visit  Subjective  Darrell Santiago is a 55 y.o. male who presents for the following: Cyst (New patient here today for growth x 2 at neck. Larger growth has been present for about 5 years and has drained blood and pus in the past. Smaller growth present for 1 year and has not drained. ).  Patient accompanied by wife.  The following portions of the chart were reviewed this encounter and updated as appropriate:   Tobacco  Allergies  Meds  Problems  Med Hx  Surg Hx  Fam Hx      Review of Systems:  No other skin or systemic complaints except as noted in HPI or Assessment and Plan.  Objective  Well appearing patient in no apparent distress; mood and affect are within normal limits.  A focused examination was performed including neck, face. Relevant physical exam findings are noted in the Assessment and Plan.  underside chin Right underside chin 3.0cm Subcutaneous nodule.  Left underside chin 5.0cm Subcutaneous nodule.    Assessment & Plan  Epidermal inclusion cyst underside chin  Cyst with symptoms and/or recent change.  Discussed surgical excision to remove, including resulting scar and possible recurrence.  Patient will schedule for surgery. Pre-op information given.  Will plan to do smaller cyst first.   Return for Surgery x 2 for cysts.  Graciella Belton, RMA, am acting as scribe for Sarina Ser, MD . Documentation: I have reviewed the above documentation for accuracy and completeness, and I agree with the above.  Sarina Ser, MD

## 2020-09-29 ENCOUNTER — Encounter: Payer: Self-pay | Admitting: Dermatology

## 2020-11-17 ENCOUNTER — Encounter: Payer: Self-pay | Admitting: Dermatology

## 2020-11-17 ENCOUNTER — Ambulatory Visit: Payer: BC Managed Care – PPO | Admitting: Dermatology

## 2020-11-17 ENCOUNTER — Telehealth: Payer: Self-pay

## 2020-11-17 ENCOUNTER — Other Ambulatory Visit: Payer: Self-pay

## 2020-11-17 DIAGNOSIS — L720 Epidermal cyst: Secondary | ICD-10-CM | POA: Diagnosis not present

## 2020-11-17 DIAGNOSIS — D485 Neoplasm of uncertain behavior of skin: Secondary | ICD-10-CM

## 2020-11-17 MED ORDER — MUPIROCIN 2 % EX OINT: 1.0000 "application " | TOPICAL_OINTMENT | Freq: Every day | CUTANEOUS | 0 refills | Status: DC

## 2020-11-17 NOTE — Telephone Encounter (Signed)
Tried to call patient regarding surgery - no answer/hd

## 2020-11-17 NOTE — Progress Notes (Signed)
   Follow-Up Visit   Subjective  Darrell Santiago is a 55 y.o. male who presents for the following: Cyst (Underside chin x 2 - Excise the smaller one today).  The following portions of the chart were reviewed this encounter and updated as appropriate:   Tobacco  Allergies  Meds  Problems  Med Hx  Surg Hx  Fam Hx     Review of Systems:  No other skin or systemic complaints except as noted in HPI or Assessment and Plan.  Objective  Well appearing patient in no apparent distress; mood and affect are within normal limits.  A focused examination was performed including underside chin. Relevant physical exam findings are noted in the Assessment and Plan.  Underside chin 4.2 cm Cystic papule  Left underside chin Subcutaneous nodule.    Assessment & Plan  Neoplasm of uncertain behavior of skin Underside chin  Skin excision  Lesion length (cm):  4.2 Lesion width (cm):  4.2 Margin per side (cm):  0 Total excision diameter (cm):  4.2 Informed consent: discussed and consent obtained   Timeout: patient name, date of birth, surgical site, and procedure verified   Procedure prep:  Patient was prepped and draped in usual sterile fashion Prep type:  Isopropyl alcohol and povidone-iodine Anesthesia: the lesion was anesthetized in a standard fashion   Anesthetic:  1% lidocaine w/ epinephrine 1-100,000 buffered w/ 8.4% NaHCO3 Instrument used comment:  15c Hemostasis achieved with: pressure   Hemostasis achieved with comment:  Electrocautery Outcome: patient tolerated procedure well with no complications   Post-procedure details: sterile dressing applied and wound care instructions given   Dressing type: bandage and pressure dressing (mupirocin)    Skin repair Complexity:  Complex Final length (cm):  5 Reason for type of repair: reduce tension to allow closure, reduce the risk of dehiscence, infection, and necrosis, reduce subcutaneous dead space and avoid a hematoma, allow closure  of the large defect, preserve normal anatomy, preserve normal anatomical and functional relationships and enhance both functionality and cosmetic results   Undermining: area extensively undermined   Undermining comment:  Undermining defect 4.2 cm Subcutaneous layers (deep stitches):  Suture size:  4-0 Suture type: Vicryl (polyglactin 910)   Subcutaneous suture technique: inverted dermal. Fine/surface layer approximation (top stitches):  Suture size:  5-0 Suture type: nylon   Stitches: simple running   Suture removal (days):  7 Hemostasis achieved with: suture and pressure Outcome: patient tolerated procedure well with no complications   Post-procedure details: sterile dressing applied and wound care instructions given   Dressing type: bandage and pressure dressing (mupirocin)    mupirocin ointment (BACTROBAN) 2 % Apply 1 application topically daily. With dressing changes  Specimen 1 - Surgical pathology Differential Diagnosis: Cyst vs other  Check Margins: No  Epidermal inclusion cyst Left underside chin  Patient is scheduled for excision 11/24/2020  Return for Follow up as scheduled for suture removal and surgery of cyst of left underside chin.  I, Ashok Cordia, CMA, am acting as scribe for Sarina Ser, MD . Documentation: I have reviewed the above documentation for accuracy and completeness, and I agree with the above.  Sarina Ser, MD

## 2020-11-17 NOTE — Patient Instructions (Signed)
Wound Care Instructions  Cleanse wound gently with soap and water once a day then pat dry with clean gauze. Apply a thing coat of Petrolatum (petroleum jelly, "Vaseline") over the wound (unless you have an allergy to this). We recommend that you use a new, sterile tube of Vaseline. Do not pick or remove scabs. Do not remove the yellow or white "healing tissue" from the base of the wound.  Cover the wound with fresh, clean, nonstick gauze and secure with paper tape. You may use Band-Aids in place of gauze and tape if the would is small enough, but would recommend trimming much of the tape off as there is often too much. Sometimes Band-Aids can irritate the skin.  You should call the office for your biopsy report after 1 week if you have not already been contacted.  If you experience any problems, such as abnormal amounts of bleeding, swelling, significant bruising, significant pain, or evidence of infection, please call the office immediately.  FOR ADULT SURGERY PATIENTS: If you need something for pain relief you may take 1 extra strength Tylenol (acetaminophen) AND 2 Ibuprofen (200mg each) together every 4 hours as needed for pain. (do not take these if you are allergic to them or if you have a reason you should not take them.) Typically, you may only need pain medication for 1 to 3 days.   If you have any questions or concerns for your doctor, please call our main line at [phone] and press option 4 to reach your doctor's medical assistant. If no one answers, please leave a voicemail as directed and we will return your call as soon as possible. Messages left after 4 pm will be answered the following business day.   You may also send us a message via MyChart. We typically respond to MyChart messages within 1-2 business days.  For prescription refills, please ask your pharmacy to contact our office. Our fax number is [phone].  If you have an urgent issue when the clinic is closed that  cannot wait until the next business day, you can page your doctor at the number below.    Please note that while we do our best to be available for urgent issues outside of office hours, we are not available 24/7.   If you have an urgent issue and are unable to reach us, you may choose to seek medical care at your doctor's office, retail clinic, urgent care center, or emergency room.  If you have a medical emergency, please immediately call 911 or go to the emergency department.  Pager Numbers  - Dr. Kowalski: [phone]  - Dr. Moye: [phone]  - Dr. Stewart: [phone]  In the event of inclement weather, please call our main line at [phone] for an update on the status of any delays or closures.  Dermatology Medication Tips: Please keep the boxes that topical medications come in in order to help keep track of the instructions about where and how to use these. Pharmacies typically print the medication instructions only on the boxes and not directly on the medication tubes.   If your medication is too expensive, please contact our office at [phone] option 4 or send us a message through MyChart.   We are unable to tell what your co-pay for medications will be in advance as this is different depending on your insurance coverage. However, we may be able to find a substitute medication at lower cost or fill out paperwork to get insurance to cover a needed   medication.   If a prior authorization is required to get your medication covered by your insurance company, please allow us 1-2 business days to complete this process.  Drug prices often vary depending on where the prescription is filled and some pharmacies may offer cheaper prices.  The website www.goodrx.com contains coupons for medications through different pharmacies. The prices here do not account for what the cost may be with help from insurance (it may be cheaper with your insurance), but the website can give you the  price if you did not use any insurance.  - You can print the associated coupon and take it with your prescription to the pharmacy.  - You may also stop by our office during regular business hours and pick up a GoodRx coupon card.  - If you need your prescription sent electronically to a different pharmacy, notify our office through [facility] MyChart or by phone at [phone] option 4.   

## 2020-11-22 IMAGING — CR PORTABLE CHEST - 1 VIEW
1 series · 1 of 1 positions shown · non-contrast
Comparison: Films from earlier in the same day.

CLINICAL DATA: Status post thoracentesis

EXAM:
PORTABLE CHEST 1 VIEW

[dg chest port 1 view]
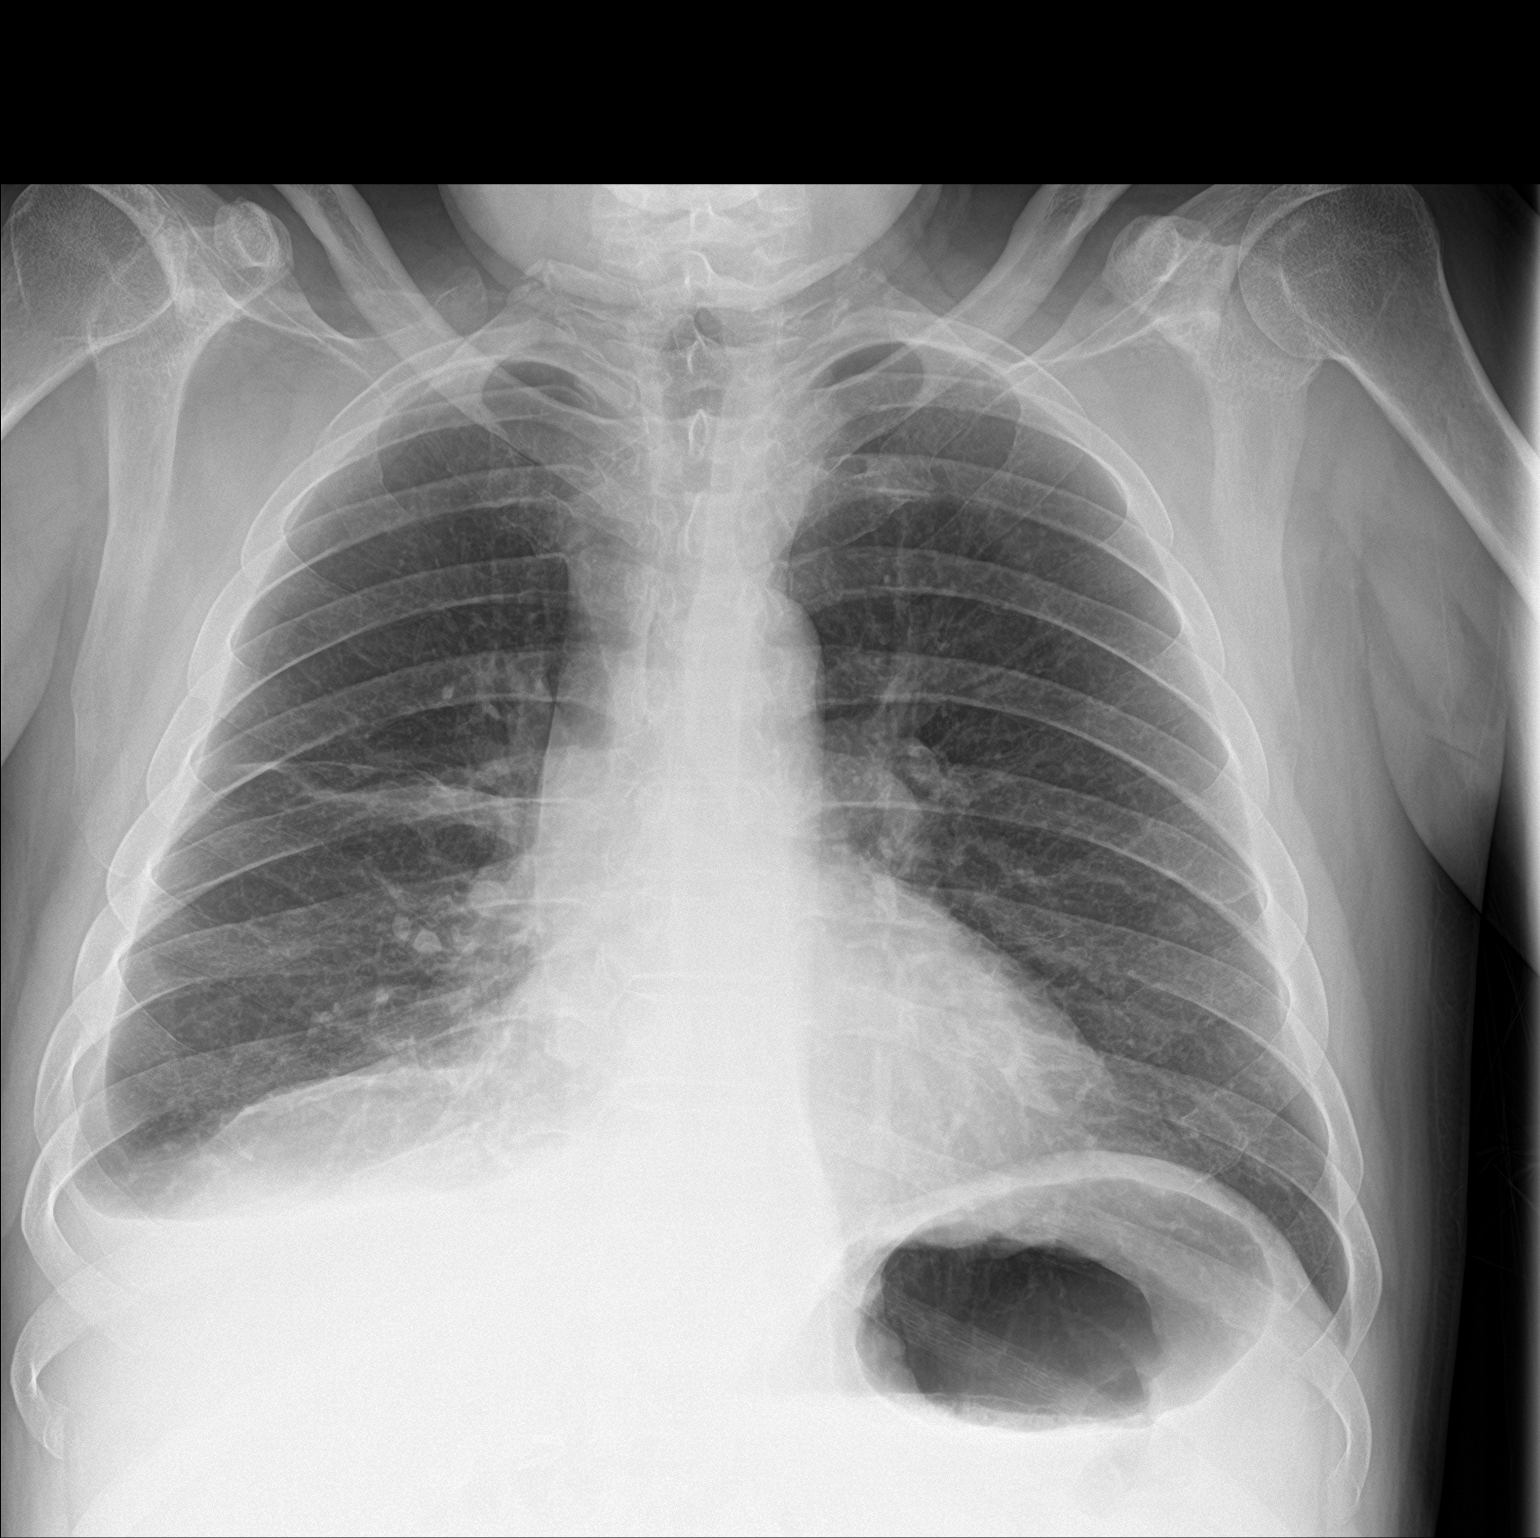

[1 of 1 positions shown; findings below may reference images not displayed]

FINDINGS: Cardiac shadow is within normal limits. Significant reduction in
right-sided pleural effusion is noted. No pneumothorax is
identified. Minimal right basilar atelectasis is seen. Left lung
remains clear. No bony abnormality is seen.
IMPRESSION: Significant reduction in right-sided pleural effusion following
thoracentesis. No pneumothorax is seen.

## 2020-11-24 ENCOUNTER — Telehealth: Payer: Self-pay

## 2020-11-24 ENCOUNTER — Other Ambulatory Visit: Payer: Self-pay

## 2020-11-24 ENCOUNTER — Ambulatory Visit: Payer: BC Managed Care – PPO | Admitting: Dermatology

## 2020-11-24 DIAGNOSIS — L720 Epidermal cyst: Secondary | ICD-10-CM | POA: Diagnosis not present

## 2020-11-24 DIAGNOSIS — D492 Neoplasm of unspecified behavior of bone, soft tissue, and skin: Secondary | ICD-10-CM

## 2020-11-24 MED ORDER — DOXYCYCLINE HYCLATE 100 MG PO TABS: ORAL_TABLET | ORAL | 0 refills | Status: DC

## 2020-11-24 NOTE — Telephone Encounter (Signed)
Pt doing fine after today's surgery./sh 

## 2020-11-24 NOTE — Patient Instructions (Signed)
Wound Care Instructions  Cleanse wound gently with soap and water once a day then pat dry with clean gauze. Apply a thing coat of Petrolatum (petroleum jelly, "Vaseline") over the wound (unless you have an allergy to this). We recommend that you use a new, sterile tube of Vaseline. Do not pick or remove scabs. Do not remove the yellow or white "healing tissue" from the base of the wound.  Cover the wound with fresh, clean, nonstick gauze and secure with paper tape. You may use Band-Aids in place of gauze and tape if the would is small enough, but would recommend trimming much of the tape off as there is often too much. Sometimes Band-Aids can irritate the skin.  You should call the office for your biopsy report after 1 week if you have not already been contacted.  If you experience any problems, such as abnormal amounts of bleeding, swelling, significant bruising, significant pain, or evidence of infection, please call the office immediately.  FOR ADULT SURGERY PATIENTS: If you need something for pain relief you may take 1 extra strength Tylenol (acetaminophen) AND 2 Ibuprofen (200mg each) together every 4 hours as needed for pain. (do not take these if you are allergic to them or if you have a reason you should not take them.) Typically, you may only need pain medication for 1 to 3 days.   If you have any questions or concerns for your doctor, please call our main line at [phone] and press option 4 to reach your doctor's medical assistant. If no one answers, please leave a voicemail as directed and we will return your call as soon as possible. Messages left after 4 pm will be answered the following business day.   You may also send us a message via MyChart. We typically respond to MyChart messages within 1-2 business days.  For prescription refills, please ask your pharmacy to contact our office. Our fax number is [phone].  If you have an urgent issue when the clinic is closed that  cannot wait until the next business day, you can page your doctor at the number below.    Please note that while we do our best to be available for urgent issues outside of office hours, we are not available 24/7.   If you have an urgent issue and are unable to reach us, you may choose to seek medical care at your doctor's office, retail clinic, urgent care center, or emergency room.  If you have a medical emergency, please immediately call 911 or go to the emergency department.  Pager Numbers  - Dr. Kowalski: [phone]  - Dr. Moye: [phone]  - Dr. Stewart: [phone]  In the event of inclement weather, please call our main line at [phone] for an update on the status of any delays or closures.  Dermatology Medication Tips: Please keep the boxes that topical medications come in in order to help keep track of the instructions about where and how to use these. Pharmacies typically print the medication instructions only on the boxes and not directly on the medication tubes.   If your medication is too expensive, please contact our office at [phone] option 4 or send us a message through MyChart.   We are unable to tell what your co-pay for medications will be in advance as this is different depending on your insurance coverage. However, we may be able to find a substitute medication at lower cost or fill out paperwork to get insurance to cover a needed   medication.   If a prior authorization is required to get your medication covered by your insurance company, please allow us 1-2 business days to complete this process.  Drug prices often vary depending on where the prescription is filled and some pharmacies may offer cheaper prices.  The website www.goodrx.com contains coupons for medications through different pharmacies. The prices here do not account for what the cost may be with help from insurance (it may be cheaper with your insurance), but the website can give you the  price if you did not use any insurance.  - You can print the associated coupon and take it with your prescription to the pharmacy.  - You may also stop by our office during regular business hours and pick up a GoodRx coupon card.  - If you need your prescription sent electronically to a different pharmacy, notify our office through [facility] MyChart or by phone at [phone] option 4.   

## 2020-11-24 NOTE — Progress Notes (Signed)
Follow-Up Visit   Subjective  Darrell Santiago is a 55 y.o. male who presents for the following: Cyst vs other (L underside chin, pt presents for excision) and Cyst bx proven (Underside chin, pt presents for excision).  Patient accompanied by wife.  The following portions of the chart were reviewed this encounter and updated as appropriate:   Tobacco  Allergies  Meds  Problems  Med Hx  Surg Hx  Fam Hx     Review of Systems:  No other skin or systemic complaints except as noted in HPI or Assessment and Plan.  Objective  Well appearing patient in no apparent distress; mood and affect are within normal limits.  A focused examination was performed including chin. Relevant physical exam findings are noted in the Assessment and Plan.  L underside chin Cystic pap 6.0cm  underside chin Healing excision site   Assessment & Plan  Neoplasm of skin L underside chin  Skin excision  Lesion length (cm):  6 Lesion width (cm):  6 Margin per side (cm):  0 Total excision diameter (cm):  6 Informed consent: discussed and consent obtained   Timeout: patient name, date of birth, surgical site, and procedure verified   Procedure prep:  Patient was prepped and draped in usual sterile fashion Prep type:  Isopropyl alcohol and povidone-iodine (Puracyn) Anesthesia: the lesion was anesthetized in a standard fashion   Anesthetic:  1% lidocaine w/ epinephrine 1-100,000 buffered w/ 8.4% NaHCO3 (13cc lido w/ epi and bupivicaine) Instrument used comment:  #15c blade Hemostasis achieved with: pressure   Hemostasis achieved with comment:  Electrocautery Outcome: patient tolerated procedure well with no complications   Post-procedure details: sterile dressing applied and wound care instructions given   Dressing type: bandage, pressure dressing and bacitracin (Mupirocin)    Skin repair Complexity:  Complex Final length (cm):  4.5 Reason for type of repair: reduce tension to allow closure,  reduce the risk of dehiscence, infection, and necrosis, reduce subcutaneous dead space and avoid a hematoma, allow closure of the large defect, preserve normal anatomy, preserve normal anatomical and functional relationships and enhance both functionality and cosmetic results   Undermining: area extensively undermined   Undermining comment:  Undermining Defect 6.0cm Subcutaneous layers (deep stitches):  Suture size:  4-0 Suture type: Vicryl (polyglactin 910)   Subcutaneous suture technique: Inverted Dermal. Fine/surface layer approximation (top stitches):  Suture size:  4-0 Suture type: nylon   Stitches: simple running   Suture removal (days):  7 Hemostasis achieved with: pressure Outcome: patient tolerated procedure well with no complications   Post-procedure details: sterile dressing applied and wound care instructions given   Dressing type: bandage, pressure dressing and bacitracin (Mupirocin)    doxycycline (VIBRA-TABS) 100 MG tablet Take one tab po BID x 7 days. Take with food.  Specimen 1 - Surgical pathology Differential Diagnosis: D48.5 Cyst vs other  Check Margins: No Cystic pap 6.0cm  Cyst vs other  Start Mupirocin oint qd with dressing changes (pt has at home)  Start Doxycycline '100mg'$  po BID x 7 days with food and drink   Doxycycline should be taken with food to prevent nausea. Do not lay down for 30 minutes after taking. Be cautious with sun exposure and use good sun protection while on this medication. Pregnant women should not take this medication.    Epidermal cyst underside chin  Bx proven  Encounter for Removal of Sutures - Incision site at the underside chin is clean, dry and intact - Wound cleansed,  sutures removed, wound cleansed and steri strips applied.  - Discussed pathology results showing epidermoid cyst  - Patient advised to keep steri-strips dry until they fall off. - Scars remodel for a full year. - Once steri-strips fall off, patient can  apply over-the-counter silicone scar cream each night to help with scar remodeling if desired. - Patient advised to call with any concerns or if they notice any new or changing lesions.   Return in about 1 week (around 12/01/2020) for suture removal.  I, Othelia Pulling, RMA, am acting as scribe for Sarina Ser, MD . Documentation: I have reviewed the above documentation for accuracy and completeness, and I agree with the above.  Sarina Ser, MD

## 2020-11-25 ENCOUNTER — Encounter: Payer: Self-pay | Admitting: Dermatology

## 2020-12-01 ENCOUNTER — Other Ambulatory Visit: Payer: Self-pay

## 2020-12-01 ENCOUNTER — Ambulatory Visit (INDEPENDENT_AMBULATORY_CARE_PROVIDER_SITE_OTHER): Payer: BC Managed Care – PPO | Admitting: Dermatology

## 2020-12-01 DIAGNOSIS — L720 Epidermal cyst: Secondary | ICD-10-CM

## 2020-12-01 DIAGNOSIS — Z4802 Encounter for removal of sutures: Secondary | ICD-10-CM

## 2020-12-01 IMAGING — DX PORTABLE CHEST - 1 VIEW
1 series · 1 of 1 positions shown · non-contrast
Comparison: 07/23/2018

CLINICAL DATA: Post thoracentesis

EXAM:
PORTABLE CHEST 1 VIEW

[chest ap]
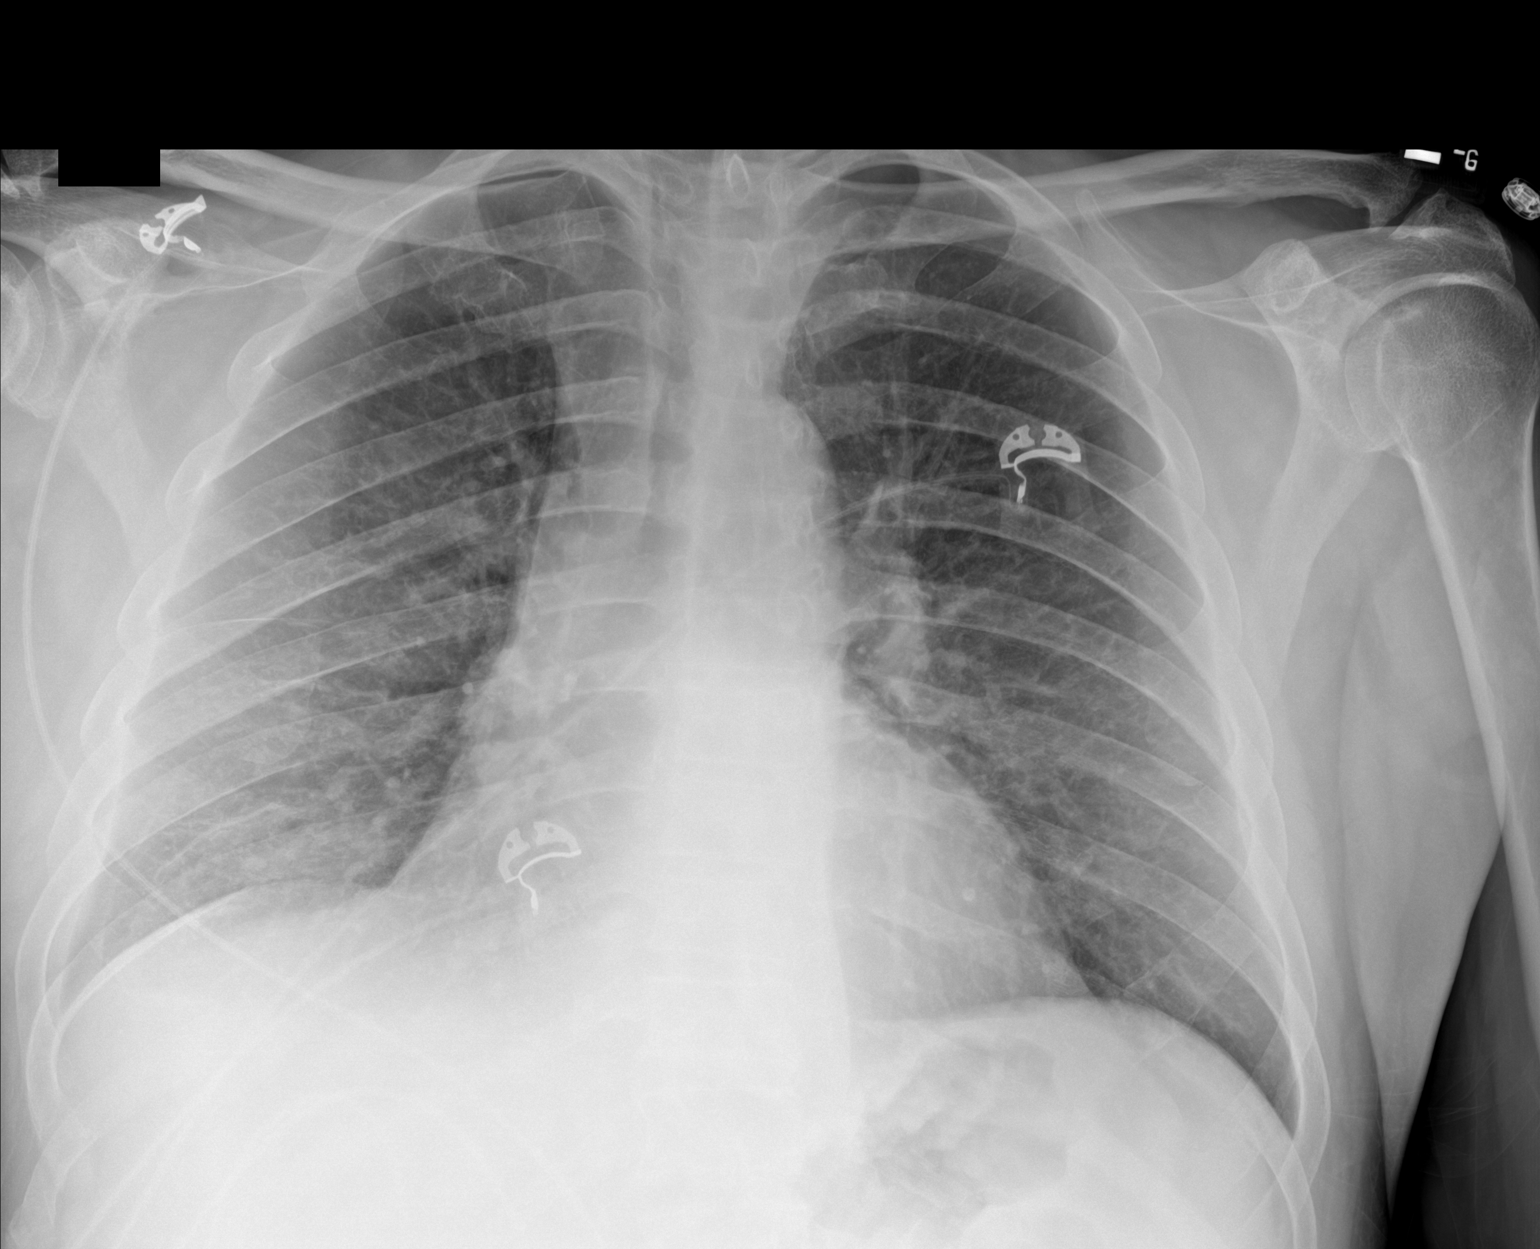

[1 of 1 positions shown; findings below may reference images not displayed]

FINDINGS: Right pleural effusion and right basilar pneumonia have improved.
Left lung remains clear. Upper normal heart size. No pneumothorax.
Unremarkable vascularity.
IMPRESSION: No pneumothorax post right thoracentesis.

## 2020-12-01 IMAGING — US US THORACENTESIS ASP PLEURAL SPACE W/IMG GUIDE
1 series · 5 of 5 positions shown · non-contrast
Comparison: none

INDICATION: Right pleural effusion

[Series 1: us thoracentesis asp pleural space w/img guide · 5 of 5 slices shown]
[im 1/5]
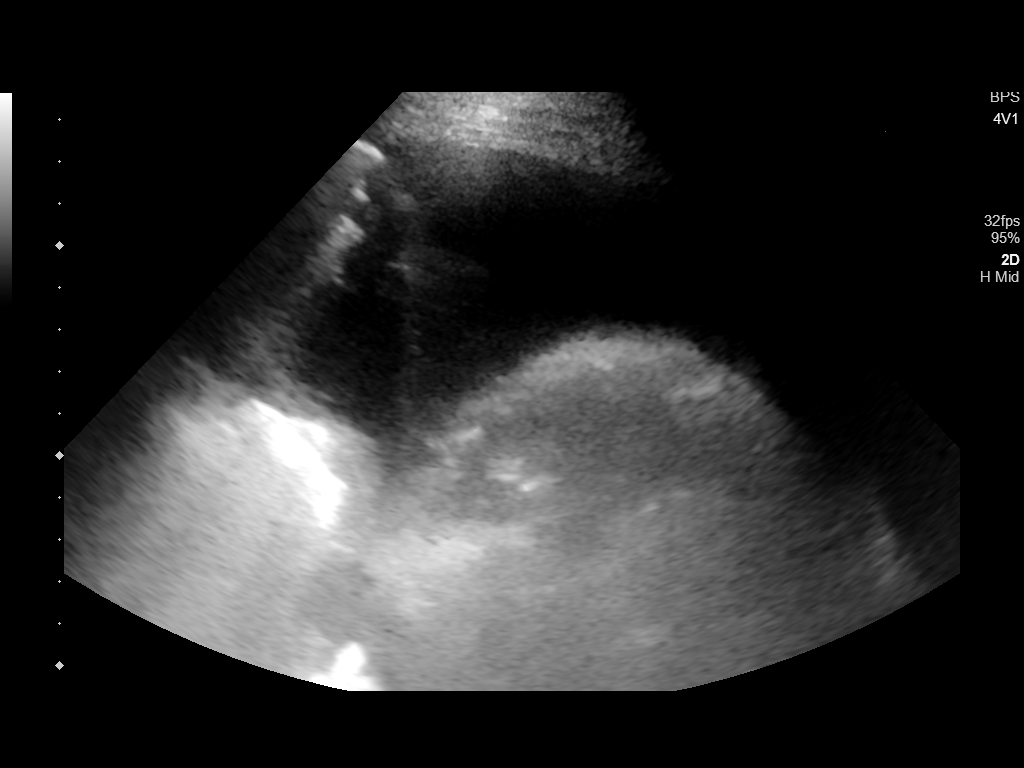
[im 2/5]
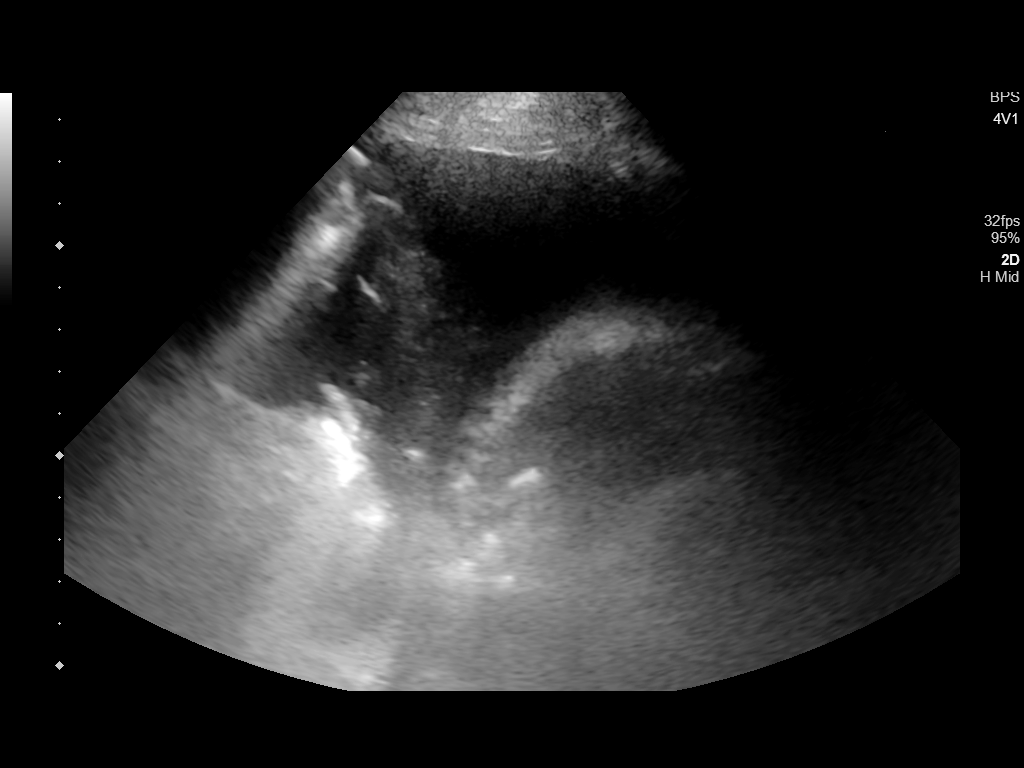
[im 3/5]
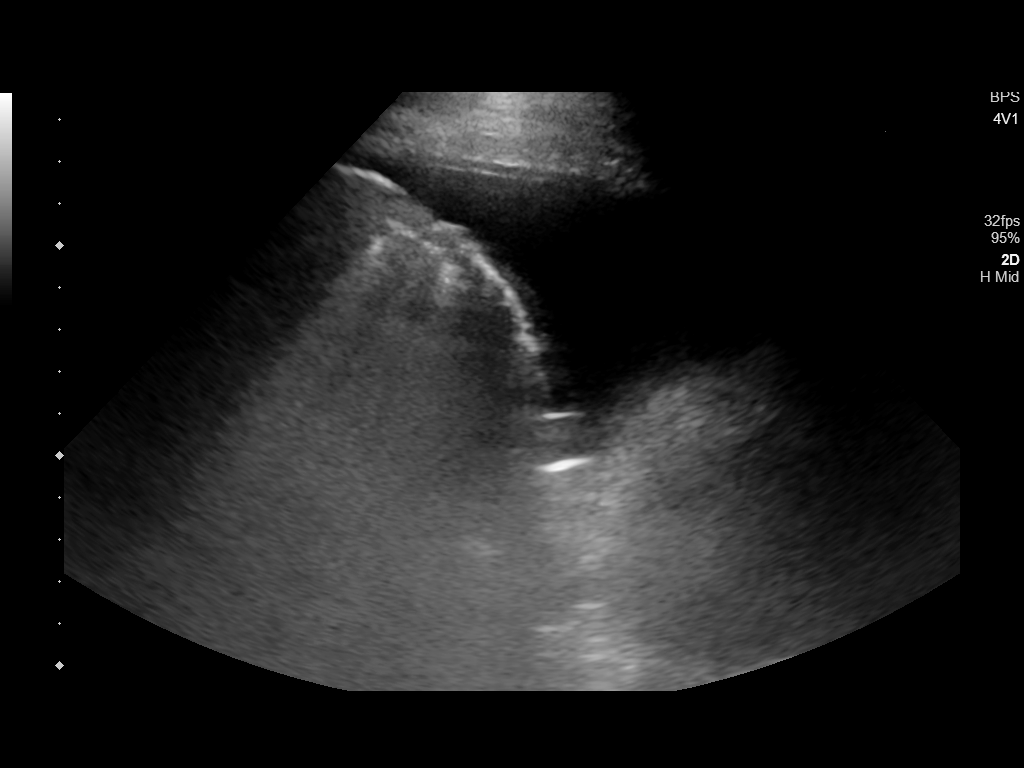
[im 4/5]
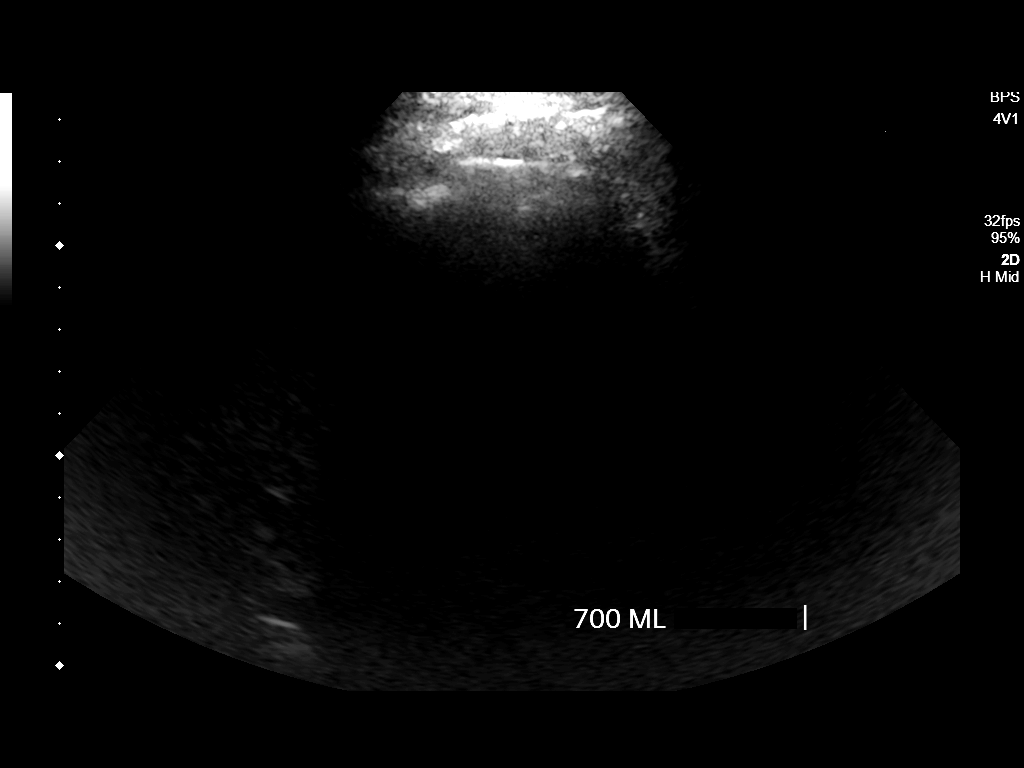
[im 5/5]
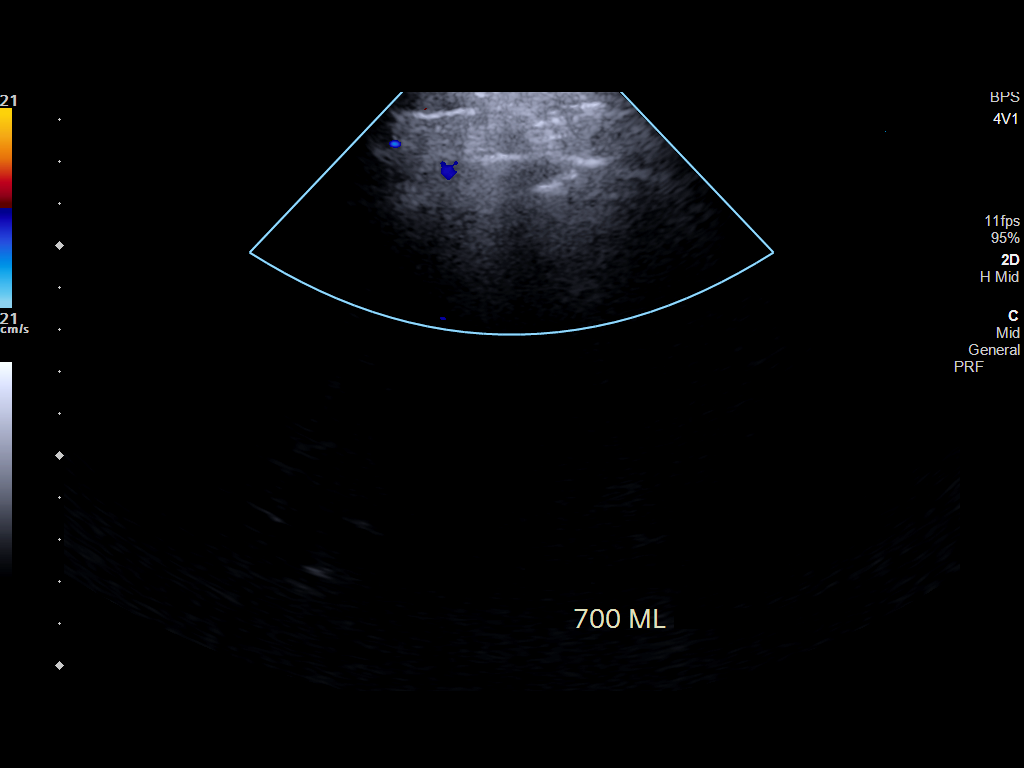

[5 of 5 positions shown; findings below may reference images not displayed]

EXAM:
ULTRASOUND GUIDED RIGHT THORACENTESIS

MEDICATIONS:
None.

COMPLICATIONS:
None immediate.

PROCEDURE:
An ultrasound guided thoracentesis was thoroughly discussed with the
patient and questions answered. The benefits, risks, alternatives
and complications were also discussed. The patient understands and
wishes to proceed with the procedure. Written consent was obtained.

Ultrasound was performed to localize and mark an adequate pocket of
fluid in the right chest. The area was then prepped and draped in
the normal sterile fashion. 1% Lidocaine was used for local
anesthesia. Under ultrasound guidance a 6 Fr Safe-T-Centesis
catheter was introduced. Thoracentesis was performed. The catheter
was removed and a dressing applied.
FINDINGS: A total of approximately 700 cc of clear yellow fluid was removed.
IMPRESSION: Successful ultrasound guided right thoracentesis yielding 700 cc of
pleural fluid.

## 2020-12-01 NOTE — Progress Notes (Signed)
   Follow-Up Visit   Subjective  Darrell Santiago is a 55 y.o. male who presents for the following: suture removal (Pathology proven cyst - L underside chin patient is here today for suture removal. ).  The following portions of the chart were reviewed this encounter and updated as appropriate:   Tobacco  Allergies  Meds  Problems  Med Hx  Surg Hx  Fam Hx     Review of Systems:  No other skin or systemic complaints except as noted in HPI or Assessment and Plan.  Objective  Well appearing patient in no apparent distress; mood and affect are within normal limits.  A focused examination was performed including face and neck. Relevant physical exam findings are noted in the Assessment and Plan.  L underside chin Healing excision site.   Assessment & Plan  Epidermal inclusion cyst L underside chin  Encounter for Removal of Sutures - Incision site at the L underside chin is clean, dry and intact - Wound cleansed, sutures removed, wound cleansed and steri strips applied.  - Discussed pathology results showing a benign cyst.  - Patient advised to keep steri-strips dry until they fall off. - Scars remodel for a full year. - Once steri-strips fall off, patient can apply over-the-counter silicone scar cream each night to help with scar remodeling if desired. - Patient advised to call with any concerns or if they notice any new or changing lesions.  Return in about 2 months (around 01/31/2021).  Luther Redo, CMA, am acting as scribe for Sarina Ser, MD . Documentation: I have reviewed the above documentation for accuracy and completeness, and I agree with the above.  Sarina Ser, MD

## 2020-12-01 NOTE — Patient Instructions (Signed)

## 2020-12-06 ENCOUNTER — Encounter: Payer: Self-pay | Admitting: Dermatology

## 2021-01-14 ENCOUNTER — Encounter: Payer: Self-pay | Admitting: Gastroenterology

## 2021-02-03 ENCOUNTER — Ambulatory Visit: Payer: BC Managed Care – PPO | Admitting: Dermatology

## 2021-02-16 ENCOUNTER — Other Ambulatory Visit: Payer: Self-pay

## 2021-02-16 ENCOUNTER — Telehealth (INDEPENDENT_AMBULATORY_CARE_PROVIDER_SITE_OTHER): Payer: BC Managed Care – PPO | Admitting: Internal Medicine

## 2021-02-16 ENCOUNTER — Encounter: Payer: Self-pay | Admitting: Internal Medicine

## 2021-02-16 VITALS — BP 118/67 | Ht 69.0 in | Wt 190.0 lb

## 2021-02-16 DIAGNOSIS — Z23 Encounter for immunization: Secondary | ICD-10-CM

## 2021-02-16 DIAGNOSIS — Z0184 Encounter for antibody response examination: Secondary | ICD-10-CM

## 2021-02-16 DIAGNOSIS — I10 Essential (primary) hypertension: Secondary | ICD-10-CM | POA: Diagnosis not present

## 2021-02-16 DIAGNOSIS — G4700 Insomnia, unspecified: Secondary | ICD-10-CM

## 2021-02-16 DIAGNOSIS — J439 Emphysema, unspecified: Secondary | ICD-10-CM

## 2021-02-16 DIAGNOSIS — F419 Anxiety disorder, unspecified: Secondary | ICD-10-CM | POA: Diagnosis not present

## 2021-02-16 DIAGNOSIS — Z1389 Encounter for screening for other disorder: Secondary | ICD-10-CM

## 2021-02-16 DIAGNOSIS — J449 Chronic obstructive pulmonary disease, unspecified: Secondary | ICD-10-CM

## 2021-02-16 DIAGNOSIS — K760 Fatty (change of) liver, not elsewhere classified: Secondary | ICD-10-CM

## 2021-02-16 DIAGNOSIS — F1024 Alcohol dependence with alcohol-induced mood disorder: Secondary | ICD-10-CM

## 2021-02-16 DIAGNOSIS — K219 Gastro-esophageal reflux disease without esophagitis: Secondary | ICD-10-CM

## 2021-02-16 DIAGNOSIS — E785 Hyperlipidemia, unspecified: Secondary | ICD-10-CM

## 2021-02-16 DIAGNOSIS — F339 Major depressive disorder, recurrent, unspecified: Secondary | ICD-10-CM

## 2021-02-16 DIAGNOSIS — Z125 Encounter for screening for malignant neoplasm of prostate: Secondary | ICD-10-CM

## 2021-02-16 DIAGNOSIS — Z1329 Encounter for screening for other suspected endocrine disorder: Secondary | ICD-10-CM

## 2021-02-16 MED ORDER — AMLODIPINE BESYLATE 2.5 MG PO TABS: 2.5000 mg | ORAL_TABLET | Freq: Every day | ORAL | 3 refills | Status: DC

## 2021-02-16 MED ORDER — PRAVASTATIN SODIUM 40 MG PO TABS: 40.0000 mg | ORAL_TABLET | Freq: Every day | ORAL | 3 refills | Status: DC

## 2021-02-16 MED ORDER — PANTOPRAZOLE SODIUM 20 MG PO TBEC: 20.0000 mg | DELAYED_RELEASE_TABLET | Freq: Every day | ORAL | 3 refills | Status: DC

## 2021-02-16 MED ORDER — HYDROXYZINE HCL 25 MG PO TABS: 25.0000 mg | ORAL_TABLET | Freq: Every day | ORAL | 5 refills | Status: DC | PRN

## 2021-02-16 MED ORDER — BUDESONIDE-FORMOTEROL FUMARATE 160-4.5 MCG/ACT IN AERO: 2.0000 | INHALATION_SPRAY | Freq: Two times a day (BID) | RESPIRATORY_TRACT | 12 refills | Status: DC

## 2021-02-16 MED ORDER — SERTRALINE HCL 50 MG PO TABS: 50.0000 mg | ORAL_TABLET | Freq: Every day | ORAL | 3 refills | Status: DC

## 2021-02-16 MED ORDER — LORAZEPAM 0.5 MG PO TABS: 0.5000 mg | ORAL_TABLET | Freq: Every day | ORAL | 2 refills | Status: DC | PRN

## 2021-02-16 MED ORDER — IPRATROPIUM-ALBUTEROL 0.5-2.5 (3) MG/3ML IN SOLN: 3.0000 mL | Freq: Four times a day (QID) | RESPIRATORY_TRACT | 11 refills | Status: DC | PRN

## 2021-02-16 MED ORDER — LOSARTAN POTASSIUM 50 MG PO TABS: 50.0000 mg | ORAL_TABLET | Freq: Every day | ORAL | 3 refills | Status: DC

## 2021-02-16 MED ORDER — PNEUMOCOCCAL 13-VAL CONJ VACC IM SUSP
0.5000 mL | Freq: Once | INTRAMUSCULAR | 0 refills | Status: AC
Start: 2021-02-16 — End: 2021-02-16

## 2021-02-16 NOTE — Patient Instructions (Signed)
Pneumococcal Conjugate Vaccine (Prevnar 13) Suspension for Injection What is this medication? PNEUMOCOCCAL VACCINE (NEU mo KOK al vak SEEN) is a vaccine used to prevent pneumococcus bacterial infections. These bacteria can cause serious infections like pneumonia, meningitis, and blood infections. This vaccine will lower your chance of getting pneumonia. If you do get pneumonia, it can make your symptoms milder and your illness shorter. This vaccine will not treat an infection and will not cause infection. This vaccine is recommended for infants and young children, adults with certain medical conditions, and adults 40 years or older. This medicine may be used for other purposes; ask your health care provider or pharmacist if you have questions. COMMON BRAND NAME(S): Prevnar, Prevnar 13 What should I tell my care team before I take this medication? They need to know if you have any of these conditions: bleeding problems fever immune system problems an unusual or allergic reaction to pneumococcal vaccine, diphtheria toxoid, other vaccines, latex, other medicines, foods, dyes, or preservatives pregnant or trying to get pregnant breast-feeding How should I use this medication? This vaccine is for injection into a muscle. It is given by a health care professional. A copy of Vaccine Information Statements will be given before each vaccination. Read this sheet carefully each time. The sheet may change frequently. Talk to your pediatrician regarding the use of this medicine in children. While this drug may be prescribed for children as young as 28 weeks old for selected conditions, precautions do apply. Overdosage: If you think you have taken too much of this medicine contact a poison control center or emergency room at once. NOTE: This medicine is only for you. Do not share this medicine with others. What if I miss a dose? It is important not to miss your dose. Call your doctor or health care professional  if you are unable to keep an appointment. What may interact with this medication? medicines for cancer chemotherapy medicines that suppress your immune function steroid medicines like prednisone or cortisone This list may not describe all possible interactions. Give your health care provider a list of all the medicines, herbs, non-prescription drugs, or dietary supplements you use. Also tell them if you smoke, drink alcohol, or use illegal drugs. Some items may interact with your medicine. What should I watch for while using this medication? Mild fever and pain should go away in 3 days or less. Report any unusual symptoms to your doctor or health care professional. What side effects may I notice from receiving this medication? Side effects that you should report to your doctor or health care professional as soon as possible: allergic reactions like skin rash, itching or hives, swelling of the face, lips, or tongue breathing problems confused fast or irregular heartbeat fever over 102 degrees F seizures unusual bleeding or bruising unusual muscle weakness Side effects that usually do not require medical attention (report to your doctor or health care professional if they continue or are bothersome): aches and pains diarrhea fever of 102 degrees F or less headache irritable loss of appetite pain, tender at site where injected trouble sleeping This list may not describe all possible side effects. Call your doctor for medical advice about side effects. You may report side effects to FDA at 1-800-FDA-1088. Where should I keep my medication? This does not apply. This vaccine is given in a clinic, pharmacy, doctor's office, or other health care setting and will not be stored at home. NOTE: This sheet is a summary. It may not cover all possible  information. If you have questions about this medicine, talk to your doctor, pharmacist, or health care provider.  2022 Elsevier/Gold Standard  (2014-01-09 10:27:27)

## 2021-02-16 NOTE — Progress Notes (Signed)
Telephone Note  I connected with Darrell Santiago   on 02/16/21 at  8:05 AM EDT by telephone and verified that I am speaking with the correct person using two identifiers.  Location patient: home, [place] Location provider:work or home office Persons participating in the virtual visit: patient, provider ,pts wife   I discussed the limitations of evaluation and management by telemedicine and the availability of in person appointments. The patient expressed understanding and agreed to proceed.   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  Past Medical History:  Diagnosis Date   Abnormal EKG    Allergy    Seasonal   Anxiety    Chicken pox    Depression    GERD (gastroesophageal reflux disease)    Hyperlipidemia    Hypertension    Urine incontinence     Past Surgical History:  Procedure Laterality Date   CHOLECYSTECTOMY  2000   LEFT HEART CATH AND CORONARY ANGIOGRAPHY N/A 01/31/2017   Procedure: LEFT HEART CATH AND CORONARY ANGIOGRAPHY;  Surgeon: Dionisio David, MD;  Location: [facility];  Service: Cardiovascular;  Laterality: N/A;     Current Outpatient Medications:    acetaminophen (TYLENOL) 325 MG tablet, Take 2 tablets (650 mg total) by mouth every 6 (six) hours as needed for mild pain (or Fever >/= 101)., Disp: , Rfl:    aspirin 81 MG chewable tablet, Chew by mouth., Disp: , Rfl:    Multiple Vitamin (MULTI-VITAMIN) tablet, Take by mouth., Disp: , Rfl:    naproxen (NAPROSYN) 500 MG tablet, Take 1 tablet (500 mg total) by mouth 2 (two) times daily with a meal., Disp: 30 tablet, Rfl: 0   pneumococcal 13-valent conjugate vaccine (PREVNAR 13) SUSP injection, Inject 0.5 mLs into the muscle once for 1 dose., Disp: 0.5  mL, Rfl: 0   vitamin B-12 (CYANOCOBALAMIN) 1000 MCG tablet, Take 1,000 mcg by mouth daily. , Disp: , Rfl:    amLODipine (NORVASC) 2.5 MG tablet, Take 1-2 tablets (2.5-5 mg total) by mouth daily. Take 2 tablets if BP >130/>80, Disp: 180 tablet, Rfl: 3   budesonide-formoterol (SYMBICORT) 160-4.5 MCG/ACT inhaler, Inhale 2 puffs into the lungs 2 (two) times daily. Rinse mouth after use, Disp: 1 each, Rfl: 12   hydrOXYzine (ATARAX/VISTARIL) 25 MG tablet, Take 1 tablet (25 mg total) by mouth daily as needed., Disp: 30 tablet, Rfl: 5   ipratropium-albuterol (DUONEB) 0.5-2.5 (3) MG/3ML SOLN, Inhale 3 mLs into the lungs 4 (four) times daily., Disp: , Rfl:    ipratropium-albuterol (DUONEB) 0.5-2.5 (3) MG/3ML SOLN, Inhale 3 mLs into the lungs every 6 (six) hours as needed., Disp: 360 mL, Rfl: 11   LORazepam (ATIVAN) 0.5 MG tablet, Take 1 tablet (0.5 mg total) by mouth daily as needed for anxiety or sleep., Disp: 30 tablet, Rfl: 2   losartan (COZAAR) 50 MG tablet, Take 1 tablet (50 mg total) by mouth daily., Disp: 90 tablet, Rfl: 3   pantoprazole (PROTONIX) 20 MG tablet, Take 1 tablet (20 mg total) by mouth daily. 30 minutes before food, Disp: 90 tablet, Rfl: 3   pravastatin (PRAVACHOL) 40 MG tablet, Take 1 tablet (40 mg total) by mouth at bedtime., Disp: 90 tablet, Rfl: 3   sertraline (  ZOLOFT) 50 MG tablet, Take 1 tablet (50 mg total) by mouth daily at 12 noon. Note tablet changed from 1/2 of 100 mg to 50 mg qd, Disp: 90 tablet, Rfl: 3  EXAM:  VITALS per patient if applicable:  GENERAL: alert, oriented, appears well and in no acute distress  PSYCH/NEURO: pleasant and cooperative, no obvious depression or anxiety, speech and thought processing grossly intact  ASSESSMENT AND PLAN:  Discussed the following assessment and plan:  Anxiety/insomnia- Plan: hydrOXYzine (ATARAX/VISTARIL) 25 MG tablet, LORazepam (ATIVAN) 0.5 MG tablet, sertraline (ZOLOFT) 50 MG tablet  Essential hypertension controlled- Plan:  amLODipine (NORVASC) 2.5 MG tablet, losartan (COZAAR) 50 MG tablet, Comprehensive metabolic panel, Lipid panel, CBC with Differential/Platelet  Chronic obstructive pulmonary disease, unspecified COPD type ([place]) - Plan: budesonide-formoterol (SYMBICORT) 160-4.5 MCG/ACT inhaler, prn duoneb  Gastroesophageal reflux disease - Plan: pantoprazole (PROTONIX) 20 MG tablet  Hyperlipidemia, unspecified hyperlipidemia type - Plan: pravastatin (PRAVACHOL) 40 MG tablet, Lipid panel  Depression, recurrent ([place]) improved - Plan: sertraline (ZOLOFT) 50 MG tablet  Alcohol dependence with alcohol-induced mood disorder ([place]) - Plan: sertraline (ZOLOFT) 50 MG tablet Still smoking same amt and drinking same amount   HM Declines flu shot, pna 23, covid vaccine, shingrix  Consider prevnar vaccine Tdap UTD  twinrix 3/3 utd -repeat Hep A/B titer with labs in future Hep C negative    rec smoking cessation pt has cut back  referred colonscopy [place] pt wants outpt colonoscopy referred [facility]  sch 11/22 consult and 03/23/21 colonoscopy PSA 0.67 normal 05/22/20 rec healthy diet and exercise    -we discussed possible serious and likely etiologies, options for evaluation and workup, limitations of telemedicine visit vs in person visit, treatment, treatment risks and precautions. Pt is agreeable to treatment via telemedicine at this moment.     I discussed the assessment and treatment plan with the patient. The patient was provided an opportunity to ask questions and all were answered. The patient agreed with the plan and demonstrated an understanding of the instructions.    Time spent 20 min Delorise Jackson, MD

## 2021-03-09 ENCOUNTER — Telehealth: Payer: Self-pay | Admitting: *Deleted

## 2021-03-10 ENCOUNTER — Other Ambulatory Visit: Payer: Self-pay | Admitting: Internal Medicine

## 2021-03-10 DIAGNOSIS — F419 Anxiety disorder, unspecified: Secondary | ICD-10-CM

## 2021-03-10 DIAGNOSIS — G4700 Insomnia, unspecified: Secondary | ICD-10-CM

## 2021-03-17 ENCOUNTER — Encounter: Payer: Self-pay | Admitting: Internal Medicine

## 2021-03-18 ENCOUNTER — Telehealth: Payer: Self-pay | Admitting: *Deleted

## 2021-03-18 NOTE — Telephone Encounter (Signed)
No return phone call, missed appt letter sent, colonoscopy cancelled.

## 2021-03-18 NOTE — Telephone Encounter (Signed)
cancel

## 2021-03-23 ENCOUNTER — Encounter: Payer: BC Managed Care – PPO | Admitting: Gastroenterology

## 2021-04-20 ENCOUNTER — Encounter: Payer: Self-pay | Admitting: Physician Assistant

## 2021-04-20 ENCOUNTER — Telehealth: Payer: BC Managed Care – PPO | Admitting: Physician Assistant

## 2021-04-20 DIAGNOSIS — Z20822 Contact with and (suspected) exposure to covid-19: Secondary | ICD-10-CM

## 2021-04-20 MED ORDER — BENZONATATE 100 MG PO CAPS: 100.0000 mg | ORAL_CAPSULE | Freq: Three times a day (TID) | ORAL | 0 refills | Status: AC

## 2021-04-20 MED ORDER — MOLNUPIRAVIR EUA 200MG CAPSULE: 4.0000 | ORAL_CAPSULE | Freq: Two times a day (BID) | ORAL | 0 refills | Status: AC

## 2021-04-20 NOTE — Progress Notes (Signed)
Mr. Darrell Santiago, Darrell Santiago are scheduled for a virtual visit with your provider today.    Just as we do with appointments in the office, we must obtain your consent to participate.  Your consent will be active for this visit and any virtual visit you may have with one of our providers in the next 365 days.    If you have a MyChart account, I can also send a copy of this consent to you electronically.  All virtual visits are billed to your insurance company just like a traditional visit in the office.  As this is a virtual visit, video technology does not allow for your provider to perform a traditional examination.  This may limit your provider's ability to fully assess your condition.  If your provider identifies any concerns that need to be evaluated in person or the need to arrange testing such as labs, EKG, etc, we will make arrangements to do so.    Although advances in technology are sophisticated, we cannot ensure that it will always work on either your end or our end.  If the connection with a video visit is poor, we may have to switch to a telephone visit.  With either a video or telephone visit, we are not always able to ensure that we have a secure connection.   I need to obtain your verbal consent now.   Are you willing to proceed with your visit today?   ABUNDIO TEUSCHER has provided verbal consent on 04/20/2021 for a virtual visit (video or telephone).   Rodney Booze, [place] 04/20/2021  7:41 PM   Date:  04/20/2021   ID:  Darrell Santiago, DOB 02/23/1966, MRN [id number]  Patient Location: Home Provider Location: Home Office   Participants: Patient and Provider for Visit and Wrap up  Method of visit: Video  Location of Patient: Home Location of Provider: Home Office Consent was obtain for visit over the video. Services rendered by provider: Visit was performed via video  A video enabled telemedicine application was used and I verified that I am speaking with the correct person using two  identifiers.  PCP:  McLean-Scocuzza, Nino Glow, MD   Chief Complaint:  COVID  History of Present Illness:    Darrell Santiago is a 56 y.o. male with history as stated below. Presents video telehealth for an acute care visit  He c/o sore throat, myalgias, generalized weakness, chills/sweats, headaches for 3 days.  He has a cough that he states is mild. Pt tested positive for COVID. Pt smokes tobacco. States his o2 is at 92% on RA.  Past Medical, Surgical, Social History, Allergies, and Medications have been Reviewed.  Past Medical History:  Diagnosis Date   Abnormal EKG    Allergy    Seasonal   Anxiety    Chicken pox    Depression    GERD (gastroesophageal reflux disease)    Hyperlipidemia    Hypertension    Urine incontinence     No outpatient medications have been marked as taking for the 04/20/21 encounter (Video Visit) with Adra Shepler S, PA-C.     Allergies:   Patient has no known allergies.   ROS See HPI for history of present illness.  Physical Exam Constitutional:      Appearance: Normal appearance.  Pulmonary:     Effort: Pulmonary effort is normal.     Comments: Speaking in full sentences Neurological:     Mental Status: He is alert.  MDM: Pt has had uri sxs x3 days. He tested positive for COVID today.   There are no diagnoses linked to this encounter.   Time:   Today, I have spent 13 minutes with the patient with telehealth technology discussing the above problems, reviewing the chart, previous notes, medications and orders.    Tests Ordered: No orders of the defined types were placed in this encounter.   Medication Changes: No orders of the defined types were placed in this encounter.    Disposition:  Follow up  Signed, Rodney Booze, PA-C  04/20/2021 7:41 PM

## 2021-04-20 NOTE — Patient Instructions (Signed)
Darrell Santiago, thank you for joining Rodney Booze, PA-C for today's virtual visit.  While this provider is not your primary care provider (PCP), if your PCP is located in our provider database this encounter information will be shared with them immediately following your visit.  Consent: (Patient) Darrell Santiago provided verbal consent for this virtual visit at the beginning of the encounter.  Current Medications:  Current Outpatient Medications:    acetaminophen (TYLENOL) 325 MG tablet, Take 2 tablets (650 mg total) by mouth every 6 (six) hours as needed for mild pain (or Fever >/= 101)., Disp: , Rfl:    amLODipine (NORVASC) 2.5 MG tablet, Take 1-2 tablets (2.5-5 mg total) by mouth daily. Take 2 tablets if BP >130/>80, Disp: 180 tablet, Rfl: 3   aspirin 81 MG chewable tablet, Chew by mouth., Disp: , Rfl:    budesonide-formoterol (SYMBICORT) 160-4.5 MCG/ACT inhaler, Inhale 2 puffs into the lungs 2 (two) times daily. Rinse mouth after use, Disp: 1 each, Rfl: 12   hydrOXYzine (ATARAX/VISTARIL) 25 MG tablet, TAKE 1 TABLET BY MOUTH DAILY AS NEEDED., Disp: 90 tablet, Rfl: 2   ipratropium-albuterol (DUONEB) 0.5-2.5 (3) MG/3ML SOLN, Inhale 3 mLs into the lungs 4 (four) times daily., Disp: , Rfl:    ipratropium-albuterol (DUONEB) 0.5-2.5 (3) MG/3ML SOLN, Inhale 3 mLs into the lungs every 6 (six) hours as needed., Disp: 360 mL, Rfl: 11   LORazepam (ATIVAN) 0.5 MG tablet, Take 1 tablet (0.5 mg total) by mouth daily as needed for anxiety or sleep., Disp: 30 tablet, Rfl: 2   losartan (COZAAR) 50 MG tablet, Take 1 tablet (50 mg total) by mouth daily., Disp: 90 tablet, Rfl: 3   Multiple Vitamin (MULTI-VITAMIN) tablet, Take by mouth., Disp: , Rfl:    naproxen (NAPROSYN) 500 MG tablet, Take 1 tablet (500 mg total) by mouth 2 (two) times daily with a meal., Disp: 30 tablet, Rfl: 0   pantoprazole (PROTONIX) 20 MG tablet, Take 1 tablet (20 mg total) by mouth daily. 30 minutes before food, Disp: 90 tablet,  Rfl: 3   pravastatin (PRAVACHOL) 40 MG tablet, Take 1 tablet (40 mg total) by mouth at bedtime., Disp: 90 tablet, Rfl: 3   sertraline (ZOLOFT) 50 MG tablet, Take 1 tablet (50 mg total) by mouth daily at 12 noon. Note tablet changed from 1/2 of 100 mg to 50 mg qd, Disp: 90 tablet, Rfl: 3   vitamin B-12 (CYANOCOBALAMIN) 1000 MCG tablet, Take 1,000 mcg by mouth daily. , Disp: , Rfl:    Medications ordered in this encounter:  No orders of the defined types were placed in this encounter.    *If you need refills on other medications prior to your next appointment, please contact your pharmacy*  Follow-Up: Call back or seek an in-person evaluation if the symptoms worsen or if the condition fails to improve as anticipated.  Other Instructions Take molnupirovir and tessalon as directed   .vvfu    If you have been instructed to have an in-person evaluation today at a local Urgent Care facility, please use the link below. It will take you to a list of all of our available [facility], including address, phone number and hours of operation. Please do not delay care.  [facility]  If you or a family member do not have a primary care provider, use the link below to schedule a visit and establish care. When you choose a [facility] primary care physician or advanced practice provider,  you gain a long-term partner in health. Find a Primary Care Provider  Learn more about [facility] in-office and virtual care options: [facility]

## 2021-04-26 ENCOUNTER — Emergency Department: Payer: BC Managed Care – PPO

## 2021-04-26 ENCOUNTER — Encounter: Payer: Self-pay | Admitting: Internal Medicine

## 2021-04-26 ENCOUNTER — Inpatient Hospital Stay
Admission: EM | Admit: 2021-04-26 | Discharge: 2021-04-30 | DRG: 191 | Disposition: A | Discharge status: Home / Self Care | Payer: BC Managed Care – PPO | Attending: Internal Medicine | Admitting: Internal Medicine

## 2021-04-26 DIAGNOSIS — E876 Hypokalemia: Secondary | ICD-10-CM

## 2021-04-26 DIAGNOSIS — D6959 Other secondary thrombocytopenia: Secondary | ICD-10-CM | POA: Diagnosis present

## 2021-04-26 DIAGNOSIS — F32A Depression, unspecified: Secondary | ICD-10-CM | POA: Diagnosis present

## 2021-04-26 DIAGNOSIS — Z800 Family history of malignant neoplasm of digestive organs: Secondary | ICD-10-CM

## 2021-04-26 DIAGNOSIS — K861 Other chronic pancreatitis: Secondary | ICD-10-CM | POA: Diagnosis present

## 2021-04-26 DIAGNOSIS — F10239 Alcohol dependence with withdrawal, unspecified: Secondary | ICD-10-CM | POA: Diagnosis present

## 2021-04-26 DIAGNOSIS — I110 Hypertensive heart disease with heart failure: Secondary | ICD-10-CM | POA: Diagnosis present

## 2021-04-26 DIAGNOSIS — Z8616 Personal history of COVID-19: Secondary | ICD-10-CM

## 2021-04-26 DIAGNOSIS — K760 Fatty (change of) liver, not elsewhere classified: Secondary | ICD-10-CM

## 2021-04-26 DIAGNOSIS — R0902 Hypoxemia: Secondary | ICD-10-CM | POA: Diagnosis present

## 2021-04-26 DIAGNOSIS — Z811 Family history of alcohol abuse and dependence: Secondary | ICD-10-CM

## 2021-04-26 DIAGNOSIS — R0602 Shortness of breath: Secondary | ICD-10-CM | POA: Diagnosis present

## 2021-04-26 DIAGNOSIS — F419 Anxiety disorder, unspecified: Secondary | ICD-10-CM | POA: Diagnosis present

## 2021-04-26 DIAGNOSIS — I2510 Atherosclerotic heart disease of native coronary artery without angina pectoris: Secondary | ICD-10-CM | POA: Diagnosis present

## 2021-04-26 DIAGNOSIS — I5032 Chronic diastolic (congestive) heart failure: Secondary | ICD-10-CM | POA: Diagnosis present

## 2021-04-26 DIAGNOSIS — Z79899 Other long term (current) drug therapy: Secondary | ICD-10-CM

## 2021-04-26 DIAGNOSIS — R7989 Other specified abnormal findings of blood chemistry: Secondary | ICD-10-CM

## 2021-04-26 DIAGNOSIS — I10 Essential (primary) hypertension: Secondary | ICD-10-CM | POA: Diagnosis present

## 2021-04-26 DIAGNOSIS — J441 Chronic obstructive pulmonary disease with (acute) exacerbation: Principal | ICD-10-CM | POA: Diagnosis present

## 2021-04-26 DIAGNOSIS — F1024 Alcohol dependence with alcohol-induced mood disorder: Secondary | ICD-10-CM | POA: Diagnosis present

## 2021-04-26 DIAGNOSIS — Z833 Family history of diabetes mellitus: Secondary | ICD-10-CM

## 2021-04-26 DIAGNOSIS — J449 Chronic obstructive pulmonary disease, unspecified: Secondary | ICD-10-CM | POA: Diagnosis present

## 2021-04-26 HISTORY — DX: COVID-19: U07.1

## 2021-04-26 LAB — BLOOD GAS, VENOUS
Acid-Base Excess: 9.4 mmol/L — ABNORMAL HIGH (ref 0.0–2.0)
Bicarbonate: 34.3 mmol/L — ABNORMAL HIGH (ref 20.0–28.0)
O2 Saturation: 71.6 %
Patient temperature: 37
pCO2, Ven: 45 mmHg (ref 44.0–60.0)
pH, Ven: 7.49 — ABNORMAL HIGH (ref 7.250–7.430)
pO2, Ven: 34 mmHg (ref 32.0–45.0)

## 2021-04-26 LAB — CBC WITH DIFFERENTIAL/PLATELET
Abs Immature Granulocytes: 0.02 10*3/uL (ref 0.00–0.07)
Basophils Absolute: 0.1 10*3/uL (ref 0.0–0.1)
Basophils Relative: 1 %
Eosinophils Absolute: 0.1 10*3/uL (ref 0.0–0.5)
Eosinophils Relative: 2 %
HCT: 49.5 % (ref 39.0–52.0)
Hemoglobin: 17.2 g/dL — ABNORMAL HIGH (ref 13.0–17.0)
Immature Granulocytes: 0 %
Lymphocytes Relative: 43 %
Lymphs Abs: 2.6 10*3/uL (ref 0.7–4.0)
MCH: 35.7 pg — ABNORMAL HIGH (ref 26.0–34.0)
MCHC: 34.7 g/dL (ref 30.0–36.0)
MCV: 102.7 fL — ABNORMAL HIGH (ref 80.0–100.0)
Monocytes Absolute: 0.6 10*3/uL (ref 0.1–1.0)
Monocytes Relative: 10 %
Neutro Abs: 2.7 10*3/uL (ref 1.7–7.7)
Neutrophils Relative %: 44 %
Platelets: 127 10*3/uL — ABNORMAL LOW (ref 150–400)
RBC: 4.82 MIL/uL (ref 4.22–5.81)
RDW: 14.4 % (ref 11.5–15.5)
WBC: 6 10*3/uL (ref 4.0–10.5)
nRBC: 0 % (ref 0.0–0.2)

## 2021-04-26 LAB — COMPREHENSIVE METABOLIC PANEL
ALT: 46 U/L — ABNORMAL HIGH (ref 0–44)
AST: 111 U/L — ABNORMAL HIGH (ref 15–41)
Albumin: 4.4 g/dL (ref 3.5–5.0)
Alkaline Phosphatase: 97 U/L (ref 38–126)
Anion gap: 13 (ref 5–15)
BUN: 8 mg/dL (ref 6–20)
CO2: 30 mmol/L (ref 22–32)
Calcium: 10.2 mg/dL (ref 8.9–10.3)
Chloride: 99 mmol/L (ref 98–111)
Creatinine, Ser: 0.59 mg/dL — ABNORMAL LOW (ref 0.61–1.24)
GFR, Estimated: >60 mL/min (ref >60)
Glucose, Bld: 108 mg/dL — ABNORMAL HIGH (ref 70–99)
Potassium: 2.6 mmol/L — CL (ref 3.5–5.1)
Sodium: 142 mmol/L (ref 135–145)
Total Bilirubin: 1 mg/dL (ref 0.3–1.2)
Total Protein: 7.4 g/dL (ref 6.5–8.1)

## 2021-04-26 LAB — MAGNESIUM: Magnesium: 1.8 mg/dL (ref 1.7–2.4)

## 2021-04-26 LAB — RESP PANEL BY RT-PCR (FLU A&B, COVID) ARPGX2
Influenza A by PCR: NEGATIVE
Influenza B by PCR: NEGATIVE
SARS Coronavirus 2 by RT PCR: NEGATIVE

## 2021-04-26 LAB — TROPONIN I (HIGH SENSITIVITY): Troponin I (High Sensitivity): 7 ng/L (ref <18)

## 2021-04-26 LAB — BRAIN NATRIURETIC PEPTIDE: B Natriuretic Peptide: 16.4 pg/mL (ref 0.0–100.0)

## 2021-04-26 MED ORDER — LORAZEPAM 2 MG/ML IJ SOLN
0.0000 mg | Freq: Two times a day (BID) | INTRAMUSCULAR | Status: DC
  Administered 2021-04-29 – 2021-04-30 (×3): 1 mg via INTRAVENOUS
  Filled 2021-04-26 (×3): qty 1

## 2021-04-26 MED ORDER — POTASSIUM CHLORIDE 10 MEQ/100ML IV SOLN
10.0000 meq | INTRAVENOUS | Status: AC
  Administered 2021-04-26 (×2): 10 meq via INTRAVENOUS
  Filled 2021-04-26 (×2): qty 100

## 2021-04-26 MED ORDER — LORAZEPAM 2 MG/ML IJ SOLN
0.0000 mg | Freq: Four times a day (QID) | INTRAMUSCULAR | Status: AC
  Administered 2021-04-27: 1 mg via INTRAVENOUS
  Administered 2021-04-28: 2 mg via INTRAVENOUS
  Filled 2021-04-26 (×4): qty 1

## 2021-04-26 MED ORDER — POTASSIUM CHLORIDE CRYS ER 20 MEQ PO TBCR
40.0000 meq | EXTENDED_RELEASE_TABLET | Freq: Once | ORAL | Status: AC
  Administered 2021-04-26: 40 meq via ORAL
  Filled 2021-04-26: qty 2

## 2021-04-26 MED ORDER — SODIUM CHLORIDE 0.9 % IV BOLUS
500.0000 mL | Freq: Once | INTRAVENOUS | Status: AC
  Administered 2021-04-27: 500 mL via INTRAVENOUS

## 2021-04-26 MED ORDER — ONDANSETRON HCL 4 MG/2ML IJ SOLN
4.0000 mg | Freq: Four times a day (QID) | INTRAMUSCULAR | Status: DC | PRN
  Administered 2021-04-27 (×3): 4 mg via INTRAVENOUS
  Filled 2021-04-26 (×3): qty 2

## 2021-04-26 MED ORDER — ACETAMINOPHEN 650 MG RE SUPP: 650.0000 mg | Freq: Four times a day (QID) | RECTAL | Status: DC | PRN

## 2021-04-26 MED ORDER — IOHEXOL 350 MG/ML SOLN
100.0000 mL | Freq: Once | INTRAVENOUS | Status: AC | PRN
  Administered 2021-04-26: 100 mL via INTRAVENOUS

## 2021-04-26 MED ORDER — FOLIC ACID 1 MG PO TABS
1.0000 mg | ORAL_TABLET | Freq: Every day | ORAL | Status: DC
  Administered 2021-04-27 – 2021-04-30 (×4): 1 mg via ORAL
  Filled 2021-04-26 (×4): qty 1

## 2021-04-26 MED ORDER — LORAZEPAM 2 MG/ML IJ SOLN
1.0000 mg | INTRAMUSCULAR | Status: AC | PRN
  Administered 2021-04-27 – 2021-04-29 (×6): 1 mg via INTRAVENOUS
  Filled 2021-04-26 (×5): qty 1

## 2021-04-26 MED ORDER — THIAMINE HCL 100 MG/ML IJ SOLN
100.0000 mg | Freq: Every day | INTRAMUSCULAR | Status: DC
  Filled 2021-04-26: qty 2

## 2021-04-26 MED ORDER — LORAZEPAM 1 MG PO TABS
1.0000 mg | ORAL_TABLET | ORAL | Status: AC | PRN
  Administered 2021-04-29 (×2): 1 mg via ORAL
  Filled 2021-04-26 (×2): qty 1

## 2021-04-26 MED ORDER — ENOXAPARIN SODIUM 40 MG/0.4ML IJ SOSY
40.0000 mg | PREFILLED_SYRINGE | INTRAMUSCULAR | Status: DC
  Administered 2021-04-27 – 2021-04-30 (×4): 40 mg via SUBCUTANEOUS
  Filled 2021-04-26 (×4): qty 0.4

## 2021-04-26 MED ORDER — ADULT MULTIVITAMIN W/MINERALS CH
1.0000 | ORAL_TABLET | Freq: Every day | ORAL | Status: DC
  Administered 2021-04-27 – 2021-04-30 (×4): 1 via ORAL
  Filled 2021-04-26 (×4): qty 1

## 2021-04-26 MED ORDER — THIAMINE HCL 100 MG PO TABS
100.0000 mg | ORAL_TABLET | Freq: Every day | ORAL | Status: DC
  Administered 2021-04-27 – 2021-04-30 (×4): 100 mg via ORAL
  Filled 2021-04-26 (×5): qty 1

## 2021-04-26 MED ORDER — ACETAMINOPHEN 325 MG PO TABS: 650.0000 mg | ORAL_TABLET | Freq: Four times a day (QID) | ORAL | Status: DC | PRN

## 2021-04-26 MED ORDER — ONDANSETRON HCL 4 MG PO TABS: 4.0000 mg | ORAL_TABLET | Freq: Four times a day (QID) | ORAL | Status: DC | PRN

## 2021-04-26 NOTE — ED Notes (Signed)
RT notified about VBG- Pt on 3L [place] sats @ 100%.

## 2021-04-26 NOTE — ED Notes (Signed)
Pt ambulatory w/assistance, 02 sat=96%, pt very unsteady on feet and felt dizzy. MD aware.

## 2021-04-26 NOTE — ED Notes (Signed)
Pt to CT scan.

## 2021-04-26 NOTE — Telephone Encounter (Signed)
Patient was seen 04/20/20 virtually by Tivis Ringer, Cortni S, PA-C.   Okay for note?

## 2021-04-26 NOTE — ED Provider Notes (Signed)
[facility] Provider Note    Event Date/Time   First MD Initiated Contact with Patient 04/26/21 2040     (approximate)   History   Chest Pain and Shortness of Breath   HPI  Darrell Santiago is a 56 y.o. male with asthma who comes in with concerns for shortness of breath.  Patient was reportedly positive for COVID on 1/3.  He reports talking to an urgent care and being started on an antiviral.  He reports having worsening shortness of breath and chest pain over the past 1 to 2 days and having low oxygen levels at home in the 80s which is why he came into the ER to be evaluated.  Denies any falls, hitting his head.  Patient is not vaccinated.      Physical Exam   Triage Vital Signs: ED Triage Vitals  Enc Vitals Group     BP 04/26/21 1926 118/81     Pulse Rate 04/26/21 1926 86     Resp 04/26/21 1926 (!) 22     Temp 04/26/21 1926 98.8 F (37.1 C)     Temp src --      SpO2 04/26/21 1914 100 %     Weight 04/26/21 1928 190 lb (86.2 kg)     Height 04/26/21 1928 5\' 9"  (1.753 m)     Head Circumference --      Peak Flow --      Pain Score 04/26/21 1929 8     Pain Loc --      Pain Edu? --      Excl. in [place]? --     Most recent vital signs: Vitals:   04/26/21 1914 04/26/21 1926  BP:  118/81  Pulse:  86  Resp:  (!) 22  Temp:  98.8 F (37.1 C)  SpO2: 100% 100%     General: Awake, no distress.  CV:  Good peripheral perfusion.  Resp:  Normal effort.  Clear lungs Abd:  No distention.  Tenderness throughout his abdomen mostly in the right upper and lower abdomen Legs: No edema noted.    ED Results / Procedures / Treatments   Labs (all labs ordered are listed, but only abnormal results are displayed) Labs Reviewed  BLOOD GAS, VENOUS - Abnormal; Notable for the following components:      Result Value   pH, Ven 7.49 (*)    Bicarbonate 34.3 (*)    Acid-Base Excess 9.4 (*)    All other components within normal limits  CBC WITH  DIFFERENTIAL/PLATELET - Abnormal; Notable for the following components:   Hemoglobin 17.2 (*)    MCV 102.7 (*)    MCH 35.7 (*)    Platelets 127 (*)    All other components within normal limits  COMPREHENSIVE METABOLIC PANEL - Abnormal; Notable for the following components:   Potassium 2.6 (*)    Glucose, Bld 108 (*)    Creatinine, Ser 0.59 (*)    AST 111 (*)    ALT 46 (*)    All other components within normal limits  BRAIN NATRIURETIC PEPTIDE     EKG  My interpretation of EKG: Normal sinus rate 75 without any ST elevation or T wave inversions but does have right bundle branch block   RADIOLOGY I have reviewed the xray personally and agree with radiology read X-rays negative for PNA  PROCEDURES:  Critical Care performed: No  .1-3 Lead EKG Interpretation Performed by: Vanessa [place], MD Authorized by: Marjean Donna  E, MD     Interpretation: normal     ECG rate:  70s   ECG rate assessment: normal     Rhythm: sinus rhythm     Ectopy: none     Conduction: normal     MEDICATIONS ORDERED IN ED: Medications  potassium chloride 10 mEq in 100 mL IVPB (10 mEq Intravenous New Bag/Given 04/26/21 2346)  potassium chloride SA (KLOR-CON M) CR tablet 40 mEq (40 mEq Oral Given 04/26/21 2224)  iohexol (OMNIPAQUE) 350 MG/ML injection 100 mL (100 mLs Intravenous Contrast Given 04/26/21 2134)     IMPRESSION / MDM / ASSESSMENT AND PLAN / ED COURSE  I reviewed the triage vital signs and the nursing notes.  Patient with known COVID from at home who comes in with concerns for worsening chest pain, shortness of breath.  I reviewed the video visit patient was prescribed Tessalon Perles and molnupiravir.  Patient reports being compliant with this.  Unfortunately he has a worsening hypoxia.  Patient was in the 70s to 54s and placed on 4 L of oxygen.  Differential diagnosis includes, but is not limited to, COVID, pneumonia.  Chest x-ray without evidence of pneumonia therefore will get CT PE to  evaluate for pulmonary embolism in concurrence with his COVID.  Patient also tender in his abdomen could consider appendicitis, cholecystitis or other acute pathology.  Patient does report daily drinking which could be contributing to slightly elevated LFTs.  Denies a history of withdrawal.  The patient is on the cardiac monitor to evaluate for evidence of arrhythmia and/or significant heart rate changes.  Patient CT imaging was all reassuring.  I took him off oxygen and oxygen levels were 95%.  We attempted to ambulate patient and his oxygen level stayed looking at this time however he was unsteady on his feet and he felt dizzy.  I am concerned about why patient's oxygen level was so low in triage but now normal although the CT was reassuring.  We discussed with patient he states that he just feels very awful.  We will discussed the hospital team for admission for electrolyte repletion, monitoring oxygen levels and will give some gentle hydration for the dizziness.     FINAL CLINICAL IMPRESSION(S) / ED DIAGNOSES   Final diagnoses:  Hypokalemia  Shortness of breath     Rx / DC Orders   ED Discharge Orders     None        Note:  This document was prepared using Dragon voice recognition software and may include unintentional dictation errors.   Vanessa [place], MD 04/26/21 [phone]

## 2021-04-26 NOTE — ED Triage Notes (Signed)
Pt presents via EMS for SOB &  CP. He states that he's been "sick" for the last week and his breathing has progressively gotten worse over the last 2 days. Pt states he tested positive for COVID last Tuesday. Per S/O the patients O2 levels have dropped down into the 80s while at home. He denies wearing O2 at home. Pts O2 level on RA 79-82%. EDP present in triage. Pt placed on 4L [place] and his O2 improved to 100%.

## 2021-04-26 NOTE — ED Triage Notes (Signed)
EMS brings pt in from home; +COVID on Tues; CP x 3 days accomp by [facility]

## 2021-04-26 NOTE — ED Provider Triage Note (Signed)
Emergency Medicine Provider Triage Evaluation Note  Darrell Santiago , a 56 y.o. male  was evaluated in triage.  Pt complains of shortness of breath and chest pain. He tested positive for Covid last Tuesday. Home SPO2 monitor has read "low 80's. No home oxygen use. History of asthma.  Review of Systems  Positive: Shortness of breath, chest pain Negative: Fever.  Physical Exam  BP 118/81    Pulse 86    Temp 98.8 F (37.1 C)    Resp (!) 22    Ht 5\' 9"  (1.753 m)    Wt 86.2 kg    SpO2 100%    BMI 28.06 kg/m  Gen:   Awake, no distress. Speaks in complete sentences despite saturation in the  Resp:  Normal effort  MSK:   Moves extremities without difficulty  Other:    Medical Decision Making  Medically screening exam initiated at 7:29 PM.  Appropriate orders placed.  QUENTIN SHOREY was informed that the remainder of the evaluation will be completed by another provider, this initial triage assessment does not replace that evaluation, and the importance of remaining in the ED until their evaluation is complete.    Victorino Dike, FNP 04/26/21 1935

## 2021-04-27 ENCOUNTER — Observation Stay
Admit: 2021-04-27 | Discharge: 2021-04-27 | Disposition: A | Discharge status: Home / Self Care | Payer: BC Managed Care – PPO | Attending: Family Medicine | Admitting: Family Medicine

## 2021-04-27 ENCOUNTER — Other Ambulatory Visit: Payer: Self-pay

## 2021-04-27 DIAGNOSIS — R0602 Shortness of breath: Secondary | ICD-10-CM

## 2021-04-27 DIAGNOSIS — K760 Fatty (change of) liver, not elsewhere classified: Secondary | ICD-10-CM

## 2021-04-27 DIAGNOSIS — F1024 Alcohol dependence with alcohol-induced mood disorder: Secondary | ICD-10-CM

## 2021-04-27 DIAGNOSIS — I2510 Atherosclerotic heart disease of native coronary artery without angina pectoris: Secondary | ICD-10-CM

## 2021-04-27 DIAGNOSIS — I10 Essential (primary) hypertension: Secondary | ICD-10-CM

## 2021-04-27 DIAGNOSIS — E876 Hypokalemia: Secondary | ICD-10-CM

## 2021-04-27 LAB — BASIC METABOLIC PANEL
Anion gap: 12 (ref 5–15)
BUN: 8 mg/dL (ref 6–20)
CO2: 28 mmol/L (ref 22–32)
Calcium: 8.9 mg/dL (ref 8.9–10.3)
Chloride: 102 mmol/L (ref 98–111)
Creatinine, Ser: 0.53 mg/dL — ABNORMAL LOW (ref 0.61–1.24)
GFR, Estimated: >60 mL/min (ref >60)
Glucose, Bld: 80 mg/dL (ref 70–99)
Potassium: 2.7 mmol/L — CL (ref 3.5–5.1)
Sodium: 142 mmol/L (ref 135–145)

## 2021-04-27 LAB — ECHOCARDIOGRAM COMPLETE
AR max vel: 2.88 cm2
AV Area VTI: 2.97 cm2
AV Area mean vel: 2.7 cm2
AV Mean grad: 4 mmHg
AV Peak grad: 7.4 mmHg
Ao pk vel: 1.36 m/s
Area-P 1/2: 3.97 cm2
Height: 69 in
MV VTI: 2.56 cm2
S' Lateral: 3.2 cm
Weight: 2761.92 oz

## 2021-04-27 LAB — HEPATITIS PANEL, ACUTE
HCV Ab: NONREACTIVE
Hep A IgM: NONREACTIVE
Hep B C IgM: NONREACTIVE
Hepatitis B Surface Ag: NONREACTIVE

## 2021-04-27 LAB — TROPONIN I (HIGH SENSITIVITY)
Troponin I (High Sensitivity): 55 ng/L — ABNORMAL HIGH (ref <18)
Troponin I (High Sensitivity): 7 ng/L (ref <18)
Troponin I (High Sensitivity): 7 ng/L (ref <18)

## 2021-04-27 LAB — LIPASE, BLOOD: Lipase: 45 U/L (ref 11–51)

## 2021-04-27 LAB — HIV ANTIBODY (ROUTINE TESTING W REFLEX): HIV Screen 4th Generation wRfx: NONREACTIVE

## 2021-04-27 MED ORDER — ALUM & MAG HYDROXIDE-SIMETH 200-200-20 MG/5ML PO SUSP
30.0000 mL | Freq: Once | ORAL | Status: AC
  Administered 2021-04-27: 09:00:00 30 mL via ORAL
  Filled 2021-04-27: qty 30

## 2021-04-27 MED ORDER — PANTOPRAZOLE SODIUM 40 MG IV SOLR
40.0000 mg | INTRAVENOUS | Status: DC
  Administered 2021-04-27 – 2021-04-30 (×4): 40 mg via INTRAVENOUS
  Filled 2021-04-27 (×4): qty 40

## 2021-04-27 MED ORDER — MORPHINE SULFATE (PF) 2 MG/ML IV SOLN
1.0000 mg | Freq: Once | INTRAVENOUS | Status: AC
  Administered 2021-04-27: 09:00:00 1 mg via INTRAVENOUS
  Filled 2021-04-27: qty 1

## 2021-04-27 MED ORDER — POTASSIUM CHLORIDE 10 MEQ/100ML IV SOLN
10.0000 meq | INTRAVENOUS | Status: AC
  Administered 2021-04-27 (×5): 10 meq via INTRAVENOUS
  Filled 2021-04-27: qty 100

## 2021-04-27 NOTE — Progress Notes (Signed)
Subjective: Patient admitted this morning, see detailed H&P by Dr. Damita Dunnings 56 year old male with medical history of COPD, CAD, hypertension, anxiety, depression, alcohol use disorder came to ED with complaints of shortness of breath, reports of hypoxia at home with pulse ox in the 80s.  Patient reported that his PCP tested positive for COVID on 04/20/2020 and was prescribed mulnupirovir.  Patient completed the treatment with this medication.  But continues to have some shortness of breath and came to the ED. In the ED O2 sats were 95 200% on room air.  Chest x-ray was unremarkable.  Potassium was 2.6.  COVID and influenza PCR negative.  Vitals:   04/27/21 0806 04/27/21 1202  BP: 125/76 121/73  Pulse: 76 64  Resp: 16 18  Temp: 98.4 F (36.9 C) 98.6 F (37 C)  SpO2: 96% 97%      A/P  Dyspnea  -Unclear etiology -CT chest negative for PE, chest x-ray unremarkable. -COVID-19 PCR negative in the ED -Wean off oxygen as tolerated  Chest pain/epigastric pain -Patient complained of chest pain this morning, he has history of GERD -Troponin obtained this morning was 7, 7 -EKG was unremarkable -Pain likely due to GERD, will give 1 dose of Maalox -Start Protonix 40 mg IV daily  Hypokalemia -Potassium 2.7 -Start IV KCl 10 mEq x 5 -Follow BMP in am  Alcohol dependence -Abnormal LFTs/diffuse fatty infiltration seen on CT abdomen/pelvis, likely alcohol-related -Started on CIWA protocol -Follow LFTs in a.m.  History of chronic pancreatitis -Lipase 45 -Patient is asymptomatic  Anxiety/depression -Continue sertraline  Hypertension -Continue amlodipine  CAD -Continue aspirin, pravastatin   Oswald Hillock [facility] [phone]

## 2021-04-27 NOTE — Progress Notes (Signed)
*  PRELIMINARY RESULTS* Echocardiogram 2D Echocardiogram has been performed.  Sherrie Sport 04/27/2021, 2:11 PM

## 2021-04-27 NOTE — Progress Notes (Signed)
Patient is c/o chest pain called telemetry he was running NSR in 70s VSS. Patient also c/o sweating and feeling SOB. Made MD aware, MD came to bedside, orders placed and carried out. The ICU charge came as well.

## 2021-04-27 NOTE — H&P (Signed)
History and Physical    JAVID KEMLER HRC:[id number] DOB: Dec 09, 1965 DOA: 04/26/2021  PCP: McLean-Scocuzza, Nino Glow, MD   Patient coming from: home  I have personally briefly reviewed patient's relevant medical records in [place]  Chief Complaint: shortness of breath  HPI: Darrell Santiago is a 56 y.o. male with medical history significant for COPD, CAD, HTN, anxiety and depression, alcohol use disorder who presents to the ED with shortness of breath and reports of hypoxia to the 80s at home.  Patient reported to his PCP testing positive for COVID on 04/20/2020 and was prescribed mulnupirovir.  He presents to the ED due to persistent shortness of breath and hypoxia at home.  He is not on baseline oxygen.  He denies chest pain, lower extremity pain or swelling, fever or chills.  Denies nausea, vomiting or abdominal pain or diarrhea.  ED course: Afebrile, tachypneic to 24 in the ED with otherwise normal vitals.  An O2 sat of 84% was recorded in triage however his O2 sat has been 95 to 100% on room air since and with ambulation Blood work: Venous blood gas with pH 7.49 and PCO2 45 CBC unremarkable CMP with potassium 2.6 Troponin 7, BNP 16, magnesium 1.8 COVID and influenza PCR negative  EKG, personally viewed and interpreted: NSR at 75 with RBBB and no acute ST-T wave changes  Imaging CTA chest negative for PE or acute cardiopulmonary disease CT abdomen and pelvis with changes of chronic pancreatitis.  Hepatic steatosis  IV potassium repletion given in the ED.  Hospitalist consulted for admission.   Review of Systems: As per HPI otherwise all other systems on review of systems negative.   Assessment/Plan    Shortness of breath with ?  Hypoxia -Etiology unclear.  Venous blood gas more consistent with hyperventilation/respiratory alkalosis - Patient reports hypoxia at home and single report of hypoxia in triage however not corroborated - Reports COVID-positive at home however COVID  PCR negative in ED - CTA chest negative for PE - Monitor pulse ox overnight with supplemental O2 if needed    Hypokalemia - Repleted in the ED - Uncertain etiology.  Continue to monitor    Alcohol dependence with alcohol-induced mood disorder (HCC)   Abnormal LFTs/hepatic steatosis, likely alcohol-related - CIWA withdrawal protocol - Thiamine folate and multivitamin -For abnormal LFTs, can consider further evaluation with RUQ sonogram - Follow acute hepatitis panel  History of acute pancreatitis - CT showing chronic pancreatitis - Patient not symptomatic    Anxiety and depression - Continue home sertraline and lorazepam    HTN (hypertension) - Continue home amlodipine    COPD (chronic obstructive pulmonary disease) (HCC) - DuoNebs as needed    CAD (coronary artery disease) - Continue aspirin, pravastatin.  Beta-blocker not on med list   DVT prophylaxis: Lovenox  Code Status: full code  Family Communication:  none  Disposition Plan: Back to previous home environment Consults called: none  Status: Observation    Physical Exam: Vitals:   04/26/21 2100 04/26/21 2200 04/26/21 2230 04/26/21 2300  BP: 120/81 136/84 123/78 133/86  Pulse: 81 79 80 71  Resp: (!) 24 (!) 21 17 (!) 21  Temp:      SpO2: 100% 100% 95% 95%  Weight:      Height:       Constitutional: Alert, oriented x 3 . Not in any apparent distress HEENT:      Head: Normocephalic and atraumatic.         Eyes: PERLA,  EOMI, Conjunctivae are normal. Sclera is non-icteric.       Mouth/Throat: Mucous membranes are moist.       Neck: Supple with no signs of meningismus. Cardiovascular: Regular rate and rhythm. No murmurs, gallops, or rubs. 2+ symmetrical distal pulses are present . No JVD. No  LE edema Respiratory: Respiratory effort normal .Lungs sounds clear bilaterally. No wheezes, crackles, or rhonchi.  Gastrointestinal: Soft, non tender, non distended. Positive bowel sounds.  Genitourinary: No CVA  tenderness. Musculoskeletal: Nontender with normal range of motion in all extremities. No cyanosis, or erythema of extremities. Neurologic:  Face is symmetric. Moving all extremities. No gross focal neurologic deficits . Skin: Skin is warm, dry.  No rash or ulcers Psychiatric: Mood and affect are appropriate     Past Medical History:  Diagnosis Date   Abnormal EKG    Allergy    Seasonal   Anxiety    Chicken pox    COVID-19    13/23   Depression    GERD (gastroesophageal reflux disease)    Hyperlipidemia    Hypertension    Urine incontinence     Past Surgical History:  Procedure Laterality Date   CHOLECYSTECTOMY  2000   LEFT HEART CATH AND CORONARY ANGIOGRAPHY N/A 01/31/2017   Procedure: LEFT HEART CATH AND CORONARY ANGIOGRAPHY;  Surgeon: Laurier Nancy, MD;  Location: [facility] INVASIVE CV [facility];  Service: Cardiovascular;  Laterality: N/A;     reports that he has been smoking cigarettes. He has a 26.00 pack-year smoking history. He has never used smokeless tobacco. He reports current alcohol use of about 21.0 standard drinks per week. He reports that he does not use drugs.  No Known Allergies  Family History  Problem Relation Age of Onset   Alcohol abuse Father    Diabetes Father    Cancer Father        ?stage IV thyroid with met   Cancer Maternal Grandfather        Colon Cancer      Prior to Admission medications   Medication Sig Start Date End Date Taking? Authorizing Provider  acetaminophen (TYLENOL) 325 MG tablet Take 2 tablets (650 mg total) by mouth every 6 (six) hours as needed for mild pain (or Fever >/= 101). 07/26/18   Enid Baas, MD  amLODipine (NORVASC) 2.5 MG tablet Take 1-2 tablets (2.5-5 mg total) by mouth daily. Take 2 tablets if BP >130/>80 02/16/21   McLean-Scocuzza, Pasty Spillers, MD  aspirin 81 MG chewable tablet Chew by mouth.    [provider]  budesonide-formoterol (SYMBICORT) 160-4.5 MCG/ACT inhaler Inhale 2 puffs into the lungs 2 (two)  times daily. Rinse mouth after use 02/16/21   McLean-Scocuzza, Pasty Spillers, MD  hydrOXYzine (ATARAX/VISTARIL) 25 MG tablet TAKE 1 TABLET BY MOUTH DAILY AS NEEDED. 03/10/21   McLean-Scocuzza, Pasty Spillers, MD  ipratropium-albuterol (DUONEB) 0.5-2.5 (3) MG/3ML SOLN Inhale 3 mLs into the lungs 4 (four) times daily. 07/19/18 02/16/22  [provider]  ipratropium-albuterol (DUONEB) 0.5-2.5 (3) MG/3ML SOLN Inhale 3 mLs into the lungs every 6 (six) hours as needed. 02/16/21   McLean-Scocuzza, Pasty Spillers, MD  LORazepam (ATIVAN) 0.5 MG tablet Take 1 tablet (0.5 mg total) by mouth daily as needed for anxiety or sleep. 02/16/21   McLean-Scocuzza, Pasty Spillers, MD  losartan (COZAAR) 50 MG tablet Take 1 tablet (50 mg total) by mouth daily. 02/16/21   McLean-Scocuzza, Pasty Spillers, MD  Multiple Vitamin (MULTI-VITAMIN) tablet Take by mouth.    [provider]  naproxen (NAPROSYN) 500 MG tablet Take 1 tablet (500 mg total) by mouth 2 (two) times daily with a meal. 04/28/20   Hassell Done, Mary-Margaret, FNP  pantoprazole (PROTONIX) 20 MG tablet Take 1 tablet (20 mg total) by mouth daily. 30 minutes before food 02/16/21   McLean-Scocuzza, Nino Glow, MD  pravastatin (PRAVACHOL) 40 MG tablet Take 1 tablet (40 mg total) by mouth at bedtime. 02/16/21   McLean-Scocuzza, Nino Glow, MD  sertraline (ZOLOFT) 50 MG tablet Take 1 tablet (50 mg total) by mouth daily at 12 noon. Note tablet changed from 1/2 of 100 mg to 50 mg qd 02/16/21 03/18/21  McLean-Scocuzza, Nino Glow, MD  vitamin B-12 (CYANOCOBALAMIN) 1000 MCG tablet Take 1,000 mcg by mouth daily.     [provider]      Labs on Admission: I have personally reviewed following labs and imaging studies  CBC: Recent Labs  Lab 04/26/21 1933  WBC 6.0  NEUTROABS 2.7  HGB 17.2*  HCT 49.5  MCV 102.7*  PLT 476*   Basic Metabolic Panel: Recent Labs  Lab 04/26/21 1933  NA 142  K 2.6*  CL 99  CO2 30  GLUCOSE 108*  BUN 8  CREATININE 0.59*  CALCIUM 10.2  MG 1.8    GFR: Estimated Creatinine Clearance: 116.2 mL/min (A) (by C-G formula based on SCr of 0.59 mg/dL (L)). Liver Function Tests: Recent Labs  Lab 04/26/21 1933  AST 111*  ALT 46*  ALKPHOS 97  BILITOT 1.0  PROT 7.4  ALBUMIN 4.4   No results for input(s): LIPASE, AMYLASE in the last 168 hours. No results for input(s): AMMONIA in the last 168 hours. Coagulation Profile: No results for input(s): INR, PROTIME in the last 168 hours. Cardiac Enzymes: No results for input(s): CKTOTAL, CKMB, CKMBINDEX, TROPONINI in the last 168 hours. BNP (last 3 results) No results for input(s): PROBNP in the last 8760 hours. HbA1C: No results for input(s): HGBA1C in the last 72 hours. CBG: No results for input(s): GLUCAP in the last 168 hours. Lipid Profile: No results for input(s): CHOL, HDL, LDLCALC, TRIG, CHOLHDL, LDLDIRECT in the last 72 hours. Thyroid Function Tests: No results for input(s): TSH, T4TOTAL, FREET4, T3FREE, THYROIDAB in the last 72 hours. Anemia Panel: No results for input(s): VITAMINB12, FOLATE, FERRITIN, TIBC, IRON, RETICCTPCT in the last 72 hours. Urine analysis:    Component Value Date/Time   COLORURINE DARK YELLOW 08/27/2018 0908   APPEARANCEUR CLOUDY (A) 08/27/2018 0908   LABSPEC 1.022 08/27/2018 0908   PHURINE 6.5 08/27/2018 0908   GLUCOSEU CANCELED 05/22/2020 0812   GLUCOSEU NEGATIVE 06/05/2017 0852   HGBUR NEGATIVE 08/27/2018 0908   BILIRUBINUR NEGATIVE 07/23/2018 1826   KETONESUR NEGATIVE 08/27/2018 0908   PROTEINUR CANCELED 05/22/2020 0812   PROTEINUR TRACE (A) 08/27/2018 0908   UROBILINOGEN 1.0 06/05/2017 0852   NITRITE NEGATIVE 08/27/2018 0908   LEUKOCYTESUR TRACE (A) 08/27/2018 0908    Radiological Exams on Admission: DG Chest 2 View  Result Date: 04/26/2021 CLINICAL DATA:  Shortness of breath EXAM: CHEST - 2 VIEW COMPARISON:  07/25/2018 FINDINGS: The heart size and mediastinal contours are within normal limits. Both lungs are clear. The visualized  skeletal structures are unremarkable. IMPRESSION: No active cardiopulmonary disease. Electronically Signed   By: Rolm Baptise M.D.   On: 04/26/2021 20:02   CT Angio Chest PE W and/or Wo Contrast  Result Date: 04/26/2021 CLINICAL DATA:  Pulmonary embolism (PE) suspected, high prob. Shortness of breath, chest pain EXAM: CT ANGIOGRAPHY CHEST WITH CONTRAST TECHNIQUE: Multidetector  CT imaging of the chest was performed using the standard protocol during bolus administration of intravenous contrast. Multiplanar CT image reconstructions and MIPs were obtained to evaluate the vascular anatomy. CONTRAST:  180mL OMNIPAQUE IOHEXOL 350 MG/ML SOLN COMPARISON:  08/13/2018 FINDINGS: Cardiovascular: No filling defects in the pulmonary arteries to suggest pulmonary emboli. Heart is normal size. Aorta is normal caliber. Scattered aortic calcifications. Mediastinum/Nodes: No mediastinal, hilar, or axillary adenopathy. Trachea and esophagus are unremarkable. Thyroid unremarkable. Lungs/Pleura: Lungs are clear. No focal airspace opacities or suspicious nodules. No effusions. Upper Abdomen: Imaging into the upper abdomen demonstrates no acute findings. Musculoskeletal: Bilateral gynecomastia. Old left rib fractures. No acute bony abnormality. Review of the MIP images confirms the above findings. IMPRESSION: No evidence of pulmonary embolus. No acute cardiopulmonary disease. Bilateral gynecomastia. Aortic Atherosclerosis (ICD10-I70.0). Electronically Signed   By: Rolm Baptise M.D.   On: 04/26/2021 22:02   CT ABDOMEN PELVIS W CONTRAST  Result Date: 04/26/2021 CLINICAL DATA:  Shortness of breath, chest pain EXAM: CT ABDOMEN AND PELVIS WITH CONTRAST TECHNIQUE: Multidetector CT imaging of the abdomen and pelvis was performed using the standard protocol following bolus administration of intravenous contrast. CONTRAST:  145mL OMNIPAQUE IOHEXOL 350 MG/ML SOLN COMPARISON:  07/24/2018 FINDINGS: Lower chest: Lung bases are clear. No  effusions. Heart is normal size. Hepatobiliary: Prior cholecystectomy. Diffuse fatty infiltration of the liver. No focal hepatic abnormality. Pancreas: Calcifications in the pancreatic head and body compatible with chronic pancreatitis. No ductal dilatation or changes of acute pancreatitis. Spleen: No focal abnormality.  Normal size. Adrenals/Urinary Tract: Low-density nodule in the left adrenal gland compatible with adenoma, stable. Small cysts in the left kidney. No stones or hydronephrosis. Urinary bladder unremarkable. Stomach/Bowel: Sigmoid diverticulosis. No active diverticulitis. Stomach and small bowel decompressed. Normal appendix. Vascular/Lymphatic: Aortic atherosclerosis. No evidence of aneurysm or adenopathy. Reproductive: No visible focal abnormality. Other: No free fluid or free air. Musculoskeletal: No acute bony abnormality. IMPRESSION: Hepatic steatosis. Changes of chronic pancreatitis.  No evidence of acute pancreatitis. Sigmoid diverticulosis.  No active diverticulitis. Aortic atherosclerosis. No acute findings. Electronically Signed   By: Rolm Baptise M.D.   On: 04/26/2021 21:59       Athena Masse MD [facility]   04/27/2021, 12:02 AM

## 2021-04-27 NOTE — Plan of Care (Signed)
  Problem: Clinical Measurements: Goal: Diagnostic test results will improve Outcome: Progressing Goal: Respiratory complications will improve Outcome: Progressing   

## 2021-04-28 ENCOUNTER — Encounter: Payer: Self-pay | Admitting: Internal Medicine

## 2021-04-28 DIAGNOSIS — K760 Fatty (change of) liver, not elsewhere classified: Secondary | ICD-10-CM | POA: Diagnosis present

## 2021-04-28 DIAGNOSIS — Z800 Family history of malignant neoplasm of digestive organs: Secondary | ICD-10-CM | POA: Diagnosis not present

## 2021-04-28 DIAGNOSIS — F1024 Alcohol dependence with alcohol-induced mood disorder: Secondary | ICD-10-CM | POA: Diagnosis present

## 2021-04-28 DIAGNOSIS — D6959 Other secondary thrombocytopenia: Secondary | ICD-10-CM | POA: Diagnosis present

## 2021-04-28 DIAGNOSIS — I2510 Atherosclerotic heart disease of native coronary artery without angina pectoris: Secondary | ICD-10-CM | POA: Diagnosis present

## 2021-04-28 DIAGNOSIS — F419 Anxiety disorder, unspecified: Secondary | ICD-10-CM | POA: Diagnosis present

## 2021-04-28 DIAGNOSIS — Z811 Family history of alcohol abuse and dependence: Secondary | ICD-10-CM | POA: Diagnosis not present

## 2021-04-28 DIAGNOSIS — F10239 Alcohol dependence with withdrawal, unspecified: Secondary | ICD-10-CM | POA: Diagnosis present

## 2021-04-28 DIAGNOSIS — K861 Other chronic pancreatitis: Secondary | ICD-10-CM | POA: Diagnosis present

## 2021-04-28 DIAGNOSIS — Z8616 Personal history of COVID-19: Secondary | ICD-10-CM | POA: Diagnosis not present

## 2021-04-28 DIAGNOSIS — R0902 Hypoxemia: Secondary | ICD-10-CM | POA: Diagnosis present

## 2021-04-28 DIAGNOSIS — I110 Hypertensive heart disease with heart failure: Secondary | ICD-10-CM | POA: Diagnosis present

## 2021-04-28 DIAGNOSIS — Z79899 Other long term (current) drug therapy: Secondary | ICD-10-CM | POA: Diagnosis not present

## 2021-04-28 DIAGNOSIS — I5032 Chronic diastolic (congestive) heart failure: Secondary | ICD-10-CM | POA: Diagnosis present

## 2021-04-28 DIAGNOSIS — Z833 Family history of diabetes mellitus: Secondary | ICD-10-CM | POA: Diagnosis not present

## 2021-04-28 DIAGNOSIS — J441 Chronic obstructive pulmonary disease with (acute) exacerbation: Secondary | ICD-10-CM | POA: Diagnosis present

## 2021-04-28 DIAGNOSIS — F32A Depression, unspecified: Secondary | ICD-10-CM | POA: Diagnosis present

## 2021-04-28 DIAGNOSIS — R0602 Shortness of breath: Secondary | ICD-10-CM | POA: Diagnosis present

## 2021-04-28 DIAGNOSIS — E876 Hypokalemia: Secondary | ICD-10-CM | POA: Diagnosis present

## 2021-04-28 LAB — COMPREHENSIVE METABOLIC PANEL
ALT: 29 U/L (ref 0–44)
AST: 49 U/L — ABNORMAL HIGH (ref 15–41)
Albumin: 3.8 g/dL (ref 3.5–5.0)
Alkaline Phosphatase: 81 U/L (ref 38–126)
Anion gap: 7 (ref 5–15)
BUN: 7 mg/dL (ref 6–20)
CO2: 33 mmol/L — ABNORMAL HIGH (ref 22–32)
Calcium: 8.8 mg/dL — ABNORMAL LOW (ref 8.9–10.3)
Chloride: 97 mmol/L — ABNORMAL LOW (ref 98–111)
Creatinine, Ser: 0.47 mg/dL — ABNORMAL LOW (ref 0.61–1.24)
GFR, Estimated: >60 mL/min (ref >60)
Glucose, Bld: 96 mg/dL (ref 70–99)
Potassium: 2.9 mmol/L — ABNORMAL LOW (ref 3.5–5.1)
Sodium: 137 mmol/L (ref 135–145)
Total Bilirubin: 2 mg/dL — ABNORMAL HIGH (ref 0.3–1.2)
Total Protein: 6.3 g/dL — ABNORMAL LOW (ref 6.5–8.1)

## 2021-04-28 LAB — CBC
HCT: 43.1 % (ref 39.0–52.0)
Hemoglobin: 14.8 g/dL (ref 13.0–17.0)
MCH: 35.7 pg — ABNORMAL HIGH (ref 26.0–34.0)
MCHC: 34.3 g/dL (ref 30.0–36.0)
MCV: 104.1 fL — ABNORMAL HIGH (ref 80.0–100.0)
Platelets: 90 10*3/uL — ABNORMAL LOW (ref 150–400)
RBC: 4.14 MIL/uL — ABNORMAL LOW (ref 4.22–5.81)
RDW: 13.4 % (ref 11.5–15.5)
WBC: 6.3 10*3/uL (ref 4.0–10.5)
nRBC: 0 % (ref 0.0–0.2)

## 2021-04-28 LAB — MAGNESIUM: Magnesium: 1.9 mg/dL (ref 1.7–2.4)

## 2021-04-28 MED ORDER — MOMETASONE FURO-FORMOTEROL FUM 200-5 MCG/ACT IN AERO
2.0000 | INHALATION_SPRAY | Freq: Two times a day (BID) | RESPIRATORY_TRACT | Status: DC
  Administered 2021-04-28 – 2021-04-30 (×4): 2 via RESPIRATORY_TRACT
  Filled 2021-04-28 (×2): qty 8.8

## 2021-04-28 MED ORDER — AMLODIPINE BESYLATE 5 MG PO TABS: 2.5000 mg | ORAL_TABLET | Freq: Every day | ORAL | Status: DC

## 2021-04-28 MED ORDER — LOSARTAN POTASSIUM 50 MG PO TABS
50.0000 mg | ORAL_TABLET | Freq: Every day | ORAL | Status: DC
Start: 2021-04-28 — End: 2021-04-28

## 2021-04-28 MED ORDER — ASPIRIN 81 MG PO CHEW: 81.0000 mg | CHEWABLE_TABLET | Freq: Every day | ORAL | Status: DC

## 2021-04-28 MED ORDER — IPRATROPIUM-ALBUTEROL 0.5-2.5 (3) MG/3ML IN SOLN
3.0000 mL | Freq: Four times a day (QID) | RESPIRATORY_TRACT | Status: DC
  Administered 2021-04-28 – 2021-04-30 (×8): 3 mL via RESPIRATORY_TRACT
  Filled 2021-04-28 (×8): qty 3

## 2021-04-28 MED ORDER — SERTRALINE HCL 50 MG PO TABS
50.0000 mg | ORAL_TABLET | Freq: Every day | ORAL | Status: DC
  Administered 2021-04-29 – 2021-04-30 (×2): 50 mg via ORAL
  Filled 2021-04-28 (×2): qty 1

## 2021-04-28 MED ORDER — POTASSIUM CHLORIDE CRYS ER 20 MEQ PO TBCR
40.0000 meq | EXTENDED_RELEASE_TABLET | Freq: Once | ORAL | Status: AC
  Administered 2021-04-28: 09:00:00 40 meq via ORAL
  Filled 2021-04-28: qty 2

## 2021-04-28 MED ORDER — POTASSIUM CHLORIDE 10 MEQ/100ML IV SOLN
10.0000 meq | INTRAVENOUS | Status: AC
  Administered 2021-04-28 (×2): 10 meq via INTRAVENOUS
  Filled 2021-04-28: qty 100

## 2021-04-28 MED ORDER — GUAIFENESIN ER 600 MG PO TB12
600.0000 mg | ORAL_TABLET | Freq: Two times a day (BID) | ORAL | Status: DC
  Administered 2021-04-28 – 2021-04-30 (×5): 600 mg via ORAL
  Filled 2021-04-28 (×5): qty 1

## 2021-04-28 MED ORDER — ALUM & MAG HYDROXIDE-SIMETH 200-200-20 MG/5ML PO SUSP
15.0000 mL | Freq: Four times a day (QID) | ORAL | Status: DC | PRN
  Administered 2021-04-28: 15 mL via ORAL
  Filled 2021-04-28: qty 30

## 2021-04-28 MED ORDER — POTASSIUM CHLORIDE CRYS ER 20 MEQ PO TBCR
40.0000 meq | EXTENDED_RELEASE_TABLET | Freq: Once | ORAL | Status: AC
  Administered 2021-04-28: 40 meq via ORAL
  Filled 2021-04-28: qty 2

## 2021-04-28 MED ORDER — ASPIRIN 81 MG PO CHEW
81.0000 mg | CHEWABLE_TABLET | Freq: Every day | ORAL | Status: DC
  Administered 2021-04-29 – 2021-04-30 (×2): 81 mg via ORAL
  Filled 2021-04-28 (×2): qty 1

## 2021-04-28 MED ORDER — POTASSIUM CHLORIDE 10 MEQ/100ML IV SOLN: 10.0000 meq | INTRAVENOUS | Status: DC

## 2021-04-28 MED ORDER — PREDNISONE 20 MG PO TABS
40.0000 mg | ORAL_TABLET | Freq: Every day | ORAL | Status: DC
  Administered 2021-04-28 – 2021-04-30 (×3): 40 mg via ORAL
  Filled 2021-04-28 (×3): qty 2

## 2021-04-28 NOTE — Progress Notes (Signed)
PROGRESS NOTE    Darrell Santiago  RXV:[id number] DOB: [birth year] DOA: 04/26/2021 PCP: McLean-Scocuzza, Nino Glow, MD   Brief Narrative: 56 year old with past medical history significant for COPD, CAD, hypertension, anxiety and depression, alcohol use disorder who presents to the ED with shortness of breath, hypoxia oxygen saturation 80% at home.  Patient was diagnosed with COVID 18/06/2021, he was prescribed mulnupirovir.  He presents with persistent shortness of breath and hypoxia.  He is not on home oxygen.  Initially his oxygen saturation was reported 84 percent in triage subsequently increased to 95%.  Repeated COVID test negative.  CTA was negative for PE.  CT abdomen and pelvis showed changes of chronic pancreatitis.  Hepatic asteatosis.   Assessment & Plan:   Principal Problem:   Shortness of breath Active Problems:   Anxiety and depression   HTN (hypertension)   Hepatic steatosis   COPD (chronic obstructive pulmonary disease) (HCC)   CAD (coronary artery disease)   Alcohol dependence with alcohol-induced mood disorder (HCC)   Hypokalemia   Abnormal LFTs  1-Acute on Chronic COPD exacerbation: -Patient presents with shortness of breath, hypoxia, bilateral rhonchorous and wheezing on lung exam. -CTA negative for PE. -We will restart duoneb nebulizer, prednisone -Start Dulera substitution for Symbicort -Recently treated for COVID.  Repeated COVID test negative.  Likely recover from COVID.  2-Hypokalemia: Replete IV and orally. Mag level.  Alcohol dependence and withdrawal: Continue with CIWA protocol. Continue with thiamine and folic acid  Transaminases, fatty infiltration of the liver: Will need counseling in regards alcohol intake.  Trending down.   History of chronic pancreatitis: Asymptomatic. On diet  Anxiety depression resumed sertraline Hypertension: Hold amlodipine,*.  Systolic blood pressure soft CAD: Continue with aspirin.  Hold statins due to  transaminases. Mild elevation of troponin: Grade 3 diastolic dysfunction by echo.  No wall motion abnormality Thrombocytopenia: Likely secondary to alcohol use  Estimated body mass index is 25.49 kg/m as calculated from the following:   Height as of this encounter: 5\' 9"  (1.753 m).   Weight as of this encounter: 78.3 kg.   DVT prophylaxis: Lovenox Code Status: Full code Family Communication: Care discussed with patient Disposition Plan:  Status is: Observation  The patient will require care spanning > 2 midnights and should be moved to inpatient because: Admitted with COPD exacerbation, alcohol withdrawal       Consultants:  None  Procedures:  ECHO  Antimicrobials:    Subjective: He appears chronic ill-appearing.  He denies abdominal pain.  He is breathing better.  He report productive cough.  Objective: Vitals:   04/28/21 0400 04/28/21 0500 04/28/21 0600 04/28/21 0813  BP: 131/89   118/65  Pulse: (!) 59 (!) 59 (!) 58 63  Resp: 18   18  Temp: 98.7 F (37.1 C)   98 F (36.7 C)  TempSrc: Oral   Oral  SpO2: 99%   95%  Weight:      Height:        Intake/Output Summary (Last 24 hours) at 04/28/2021 1044 Last data filed at 04/28/2021 0820 Gross per 24 hour  Intake 1200 ml  Output 1500 ml  Net -300 ml   Filed Weights   04/26/21 1928 04/27/21 0340  Weight: 86.2 kg 78.3 kg    Examination:  General exam: Appears calm and comfortable  Respiratory system: Rhonchorous and wheezing Cardiovascular system: S1 & S2 heard, RRR. No JVD, murmurs, rubs, gallops or clicks. No pedal edema. Gastrointestinal system: Abdomen is nondistended, soft and nontender. No  organomegaly or masses felt. Normal bowel sounds heard. Central nervous system: Alert and oriented.  Extremities: Symmetric 5 x 5 power.    Data Reviewed: I have personally reviewed following labs and imaging studies  CBC: Recent Labs  Lab 04/26/21 1933 04/28/21 0404  WBC 6.0 6.3  NEUTROABS 2.7  --    HGB 17.2* 14.8  HCT 49.5 43.1  MCV 102.7* 104.1*  PLT 127* 90*   Basic Metabolic Panel: Recent Labs  Lab 04/26/21 1933 04/27/21 0516 04/28/21 0404  NA 142 142 137  K 2.6* 2.7* 2.9*  CL 99 102 97*  CO2 30 28 33*  GLUCOSE 108* 80 96  BUN 8 8 7   CREATININE 0.59* 0.53* 0.47*  CALCIUM 10.2 8.9 8.8*  MG 1.8  --  1.9   GFR: Estimated Creatinine Clearance: 106.8 mL/min (A) (by C-G formula based on SCr of 0.47 mg/dL (L)). Liver Function Tests: Recent Labs  Lab 04/26/21 1933 04/28/21 0404  AST 111* 49*  ALT 46* 29  ALKPHOS 97 81  BILITOT 1.0 2.0*  PROT 7.4 6.3*  ALBUMIN 4.4 3.8   Recent Labs  Lab 04/27/21 0516  LIPASE 45   No results for input(s): AMMONIA in the last 168 hours. Coagulation Profile: No results for input(s): INR, PROTIME in the last 168 hours. Cardiac Enzymes: No results for input(s): CKTOTAL, CKMB, CKMBINDEX, TROPONINI in the last 168 hours. BNP (last 3 results) No results for input(s): PROBNP in the last 8760 hours. HbA1C: No results for input(s): HGBA1C in the last 72 hours. CBG: No results for input(s): GLUCAP in the last 168 hours. Lipid Profile: No results for input(s): CHOL, HDL, LDLCALC, TRIG, CHOLHDL, LDLDIRECT in the last 72 hours. Thyroid Function Tests: No results for input(s): TSH, T4TOTAL, FREET4, T3FREE, THYROIDAB in the last 72 hours. Anemia Panel: No results for input(s): VITAMINB12, FOLATE, FERRITIN, TIBC, IRON, RETICCTPCT in the last 72 hours. Sepsis Labs: No results for input(s): PROCALCITON, LATICACIDVEN in the last 168 hours.  Recent Results (from the past 240 hour(s))  Resp Panel by RT-PCR (Flu A&B, Covid) Nasopharyngeal Swab     Status: None   Collection Time: 04/26/21  8:52 PM   Specimen: Nasopharyngeal Swab; Nasopharyngeal(NP) swabs in vial transport medium  Result Value Ref Range Status   SARS Coronavirus 2 by RT PCR NEGATIVE NEGATIVE Final    Comment: (NOTE) SARS-CoV-2 target nucleic acids are NOT DETECTED.  The  SARS-CoV-2 RNA is generally detectable in upper respiratory specimens during the acute phase of infection. The lowest concentration of SARS-CoV-2 viral copies this assay can detect is 138 copies/mL. A negative result does not preclude SARS-Cov-2 infection and should not be used as the sole basis for treatment or other patient management decisions. A negative result may occur with  improper specimen collection/handling, submission of specimen other than nasopharyngeal swab, presence of viral mutation(s) within the areas targeted by this assay, and inadequate number of viral copies(<138 copies/mL). A negative result must be combined with clinical observations, patient history, and epidemiological information. The expected result is Negative.  Fact Sheet for Patients:  EntrepreneurPulse.com.au  Fact Sheet for Healthcare Providers:  IncredibleEmployment.be  This test is no t yet approved or cleared by the [place] FDA and  has been authorized for detection and/or diagnosis of SARS-CoV-2 by FDA under an Emergency Use Authorization (EUA). This EUA will remain  in effect (meaning this test can be used) for the duration of the COVID-19 declaration under Section 564(b)(1) of the Act, 21 U.S.C.section 360bbb-3(b)(1), unless  the authorization is terminated  or revoked sooner.       Influenza A by PCR NEGATIVE NEGATIVE Final   Influenza B by PCR NEGATIVE NEGATIVE Final    Comment: (NOTE) The Xpert Xpress SARS-CoV-2/FLU/RSV plus assay is intended as an aid in the diagnosis of influenza from Nasopharyngeal swab specimens and should not be used as a sole basis for treatment. Nasal washings and aspirates are unacceptable for Xpert Xpress SARS-CoV-2/FLU/RSV testing.  Fact Sheet for Patients: EntrepreneurPulse.com.au  Fact Sheet for Healthcare Providers: IncredibleEmployment.be  This test is not yet approved or  cleared by the [place] FDA and has been authorized for detection and/or diagnosis of SARS-CoV-2 by FDA under an Emergency Use Authorization (EUA). This EUA will remain in effect (meaning this test can be used) for the duration of the COVID-19 declaration under Section 564(b)(1) of the Act, 21 U.S.C. section 360bbb-3(b)(1), unless the authorization is terminated or revoked.  Performed at [facility], [address], [place], [place] [address]          Radiology Studies: DG Chest 2 View  Result Date: 04/26/2021 CLINICAL DATA:  Shortness of breath EXAM: CHEST - 2 VIEW COMPARISON:  07/25/2018 FINDINGS: The heart size and mediastinal contours are within normal limits. Both lungs are clear. The visualized skeletal structures are unremarkable. IMPRESSION: No active cardiopulmonary disease. Electronically Signed   By: Rolm Baptise M.D.   On: 04/26/2021 20:02   CT Angio Chest PE W and/or Wo Contrast  Result Date: 04/26/2021 CLINICAL DATA:  Pulmonary embolism (PE) suspected, high prob. Shortness of breath, chest pain EXAM: CT ANGIOGRAPHY CHEST WITH CONTRAST TECHNIQUE: Multidetector CT imaging of the chest was performed using the standard protocol during bolus administration of intravenous contrast. Multiplanar CT image reconstructions and MIPs were obtained to evaluate the vascular anatomy. CONTRAST:  187mL OMNIPAQUE IOHEXOL 350 MG/ML SOLN COMPARISON:  08/13/2018 FINDINGS: Cardiovascular: No filling defects in the pulmonary arteries to suggest pulmonary emboli. Heart is normal size. Aorta is normal caliber. Scattered aortic calcifications. Mediastinum/Nodes: No mediastinal, hilar, or axillary adenopathy. Trachea and esophagus are unremarkable. Thyroid unremarkable. Lungs/Pleura: Lungs are clear. No focal airspace opacities or suspicious nodules. No effusions. Upper Abdomen: Imaging into the upper abdomen demonstrates no acute findings. Musculoskeletal: Bilateral gynecomastia. Old left  rib fractures. No acute bony abnormality. Review of the MIP images confirms the above findings. IMPRESSION: No evidence of pulmonary embolus. No acute cardiopulmonary disease. Bilateral gynecomastia. Aortic Atherosclerosis (ICD10-I70.0). Electronically Signed   By: Rolm Baptise M.D.   On: 04/26/2021 22:02   CT ABDOMEN PELVIS W CONTRAST  Result Date: 04/26/2021 CLINICAL DATA:  Shortness of breath, chest pain EXAM: CT ABDOMEN AND PELVIS WITH CONTRAST TECHNIQUE: Multidetector CT imaging of the abdomen and pelvis was performed using the standard protocol following bolus administration of intravenous contrast. CONTRAST:  131mL OMNIPAQUE IOHEXOL 350 MG/ML SOLN COMPARISON:  07/24/2018 FINDINGS: Lower chest: Lung bases are clear. No effusions. Heart is normal size. Hepatobiliary: Prior cholecystectomy. Diffuse fatty infiltration of the liver. No focal hepatic abnormality. Pancreas: Calcifications in the pancreatic head and body compatible with chronic pancreatitis. No ductal dilatation or changes of acute pancreatitis. Spleen: No focal abnormality.  Normal size. Adrenals/Urinary Tract: Low-density nodule in the left adrenal gland compatible with adenoma, stable. Small cysts in the left kidney. No stones or hydronephrosis. Urinary bladder unremarkable. Stomach/Bowel: Sigmoid diverticulosis. No active diverticulitis. Stomach and small bowel decompressed. Normal appendix. Vascular/Lymphatic: Aortic atherosclerosis. No evidence of aneurysm or adenopathy. Reproductive: No visible focal abnormality. Other: No  free fluid or free air. Musculoskeletal: No acute bony abnormality. IMPRESSION: Hepatic steatosis. Changes of chronic pancreatitis.  No evidence of acute pancreatitis. Sigmoid diverticulosis.  No active diverticulitis. Aortic atherosclerosis. No acute findings. Electronically Signed   By: Rolm Baptise M.D.   On: 04/26/2021 21:59   ECHOCARDIOGRAM COMPLETE  Result Date: 04/27/2021    ECHOCARDIOGRAM REPORT   Patient  Name:   Darrell Santiago Date of Exam: 04/27/2021 Medical Rec #:  [id number]      Height:       69.0 in Accession #:    [id number]     Weight:       172.6 lb Date of Birth:  1966-01-16      BSA:          1.941 m Patient Age:    81 years       BP:           121/73 mmHg Patient Gender: M              HR:           64 bpm. Exam Location:  [facility] Procedure: 2D Echo, Color Doppler and Cardiac Doppler Indications:     Chest pain R07.9  History:         Patient has no prior history of Echocardiogram examinations.                  Risk Factors:Hypertension and Dyslipidemia.  Sonographer:     Sherrie Sport Referring Phys:  [place] Diagnosing Phys: Neoma Laming  Sonographer Comments: Suboptimal parasternal window. IMPRESSIONS  1. Left ventricular ejection fraction, by estimation, is 60 to 65%. The left ventricle has normal function. The left ventricle has no regional wall motion abnormalities. There is mild concentric left ventricular hypertrophy. Left ventricular diastolic parameters are consistent with Grade III diastolic dysfunction (restrictive).  2. Right ventricular systolic function is normal. The right ventricular size is normal.  3. Left atrial size was mildly dilated.  4. Right atrial size was mildly dilated.  5. The mitral valve is normal in structure. Mild mitral valve regurgitation. No evidence of mitral stenosis.  6. The aortic valve is normal in structure. Aortic valve regurgitation is not visualized. No aortic stenosis is present.  7. The inferior vena cava is normal in size with greater than 50% respiratory variability, suggesting right atrial pressure of 3 mmHg. FINDINGS  Left Ventricle: Left ventricular ejection fraction, by estimation, is 60 to 65%. The left ventricle has normal function. The left ventricle has no regional wall motion abnormalities. The left ventricular internal cavity size was normal in size. There is  mild concentric left ventricular hypertrophy. Left ventricular diastolic parameters  are consistent with Grade III diastolic dysfunction (restrictive). Right Ventricle: The right ventricular size is normal. No increase in right ventricular wall thickness. Right ventricular systolic function is normal. Left Atrium: Left atrial size was mildly dilated. Right Atrium: Right atrial size was mildly dilated. Pericardium: There is no evidence of pericardial effusion. Mitral Valve: The mitral valve is normal in structure. Mild mitral valve regurgitation. No evidence of mitral valve stenosis. MV peak gradient, 5.7 mmHg. The mean mitral valve gradient is 3.0 mmHg. Tricuspid Valve: The tricuspid valve is normal in structure. Tricuspid valve regurgitation is mild . No evidence of tricuspid stenosis. Aortic Valve: The aortic valve is normal in structure. Aortic valve regurgitation is not visualized. No aortic stenosis is present. Aortic valve mean gradient measures 4.0 mmHg. Aortic valve peak gradient  measures 7.4 mmHg. Aortic valve area, by VTI measures 2.97 cm. Pulmonic Valve: The pulmonic valve was normal in structure. Pulmonic valve regurgitation is not visualized. No evidence of pulmonic stenosis. Aorta: The aortic root is normal in size and structure. Venous: The inferior vena cava is normal in size with greater than 50% respiratory variability, suggesting right atrial pressure of 3 mmHg. IAS/Shunts: No atrial level shunt detected by color flow Doppler.  LEFT VENTRICLE PLAX 2D LVIDd:         5.10 cm   Diastology LVIDs:         3.20 cm   LV e' medial:    5.77 cm/s LV PW:         1.10 cm   LV E/e' medial:  20.6 LV IVS:        0.85 cm   LV e' lateral:   8.92 cm/s LVOT diam:     2.20 cm   LV E/e' lateral: 13.3 LV SV:         83 LV SV Index:   43 LVOT Area:     3.80 cm  RIGHT VENTRICLE RV Basal diam:  3.30 cm RV S prime:     12.20 cm/s TAPSE (M-mode): 2.7 cm LEFT ATRIUM             Index        RIGHT ATRIUM           Index LA diam:        3.30 cm 1.70 cm/m   RA Area:     17.00 cm LA Vol (A2C):   64.7 ml  33.34 ml/m  RA Volume:   43.20 ml  22.26 ml/m LA Vol (A4C):   56.9 ml 29.32 ml/m LA Biplane Vol: 63.3 ml 32.62 ml/m  AORTIC VALVE                    PULMONIC VALVE AV Area (Vmax):    2.88 cm     PV Vmax:        0.72 m/s AV Area (Vmean):   2.70 cm     PV Vmean:       49.700 cm/s AV Area (VTI):     2.97 cm     PV VTI:         0.173 m AV Vmax:           136.00 cm/s  PV Peak grad:   2.1 mmHg AV Vmean:          94.800 cm/s  PV Mean grad:   1.0 mmHg AV VTI:            0.280 m      RVOT Peak grad: 3 mmHg AV Peak Grad:      7.4 mmHg AV Mean Grad:      4.0 mmHg LVOT Vmax:         103.00 cm/s LVOT Vmean:        67.300 cm/s LVOT VTI:          0.219 m LVOT/AV VTI ratio: 0.78  AORTA Ao Root diam: 3.37 cm MITRAL VALVE                TRICUSPID VALVE MV Area (PHT): 3.97 cm     TR Peak grad:   23.4 mmHg MV Area VTI:   2.56 cm     TR Vmax:        242.00 cm/s MV Peak grad:  5.7 mmHg MV Mean  grad:  3.0 mmHg     SHUNTS MV Vmax:       1.19 m/s     Systemic VTI:  0.22 m MV Vmean:      78.9 cm/s    Systemic Diam: 2.20 cm MV Decel Time: 191 msec     Pulmonic VTI:  0.184 m MV E velocity: 119.00 cm/s MV A velocity: 99.40 cm/s MV E/A ratio:  1.20 Shaukat Khan Electronically signed by Neoma Laming Signature Date/Time: 04/27/2021/9:55:42 PM    Final         Scheduled Meds:  enoxaparin (LOVENOX) injection  40 mg Subcutaneous S85I   folic acid  1 mg Oral Daily   guaiFENesin  600 mg Oral BID   ipratropium-albuterol  3 mL Nebulization Q6H   LORazepam  0-4 mg Intravenous Q6H   Followed by   Derrill Memo ON 04/29/2021] LORazepam  0-4 mg Intravenous Q12H   multivitamin with minerals  1 tablet Oral Daily   pantoprazole (PROTONIX) IV  40 mg Intravenous Q24H   potassium chloride  40 mEq Oral Once   thiamine  100 mg Oral Daily   Or   thiamine  100 mg Intravenous Daily   Continuous Infusions:   LOS: 0 days    Time spent: 35 minutes     Mamie Hundertmark A Earon Rivest, MD [facility]   If 7PM-7AM, please contact  night-coverage [email or web address]  04/28/2021, 10:44 AM

## 2021-04-28 NOTE — Progress Notes (Signed)
Notified on call NP Hospitalist regarding patient Potassium level is 2.9. no Magnesium level was drawn, Platelet 90. Patient SB 59 with BB, at this time no noted ectopy on the monitor. Resting in bed with eyes closed.

## 2021-04-28 NOTE — Progress Notes (Signed)
Orders received. Supplemental Potassium will be given.

## 2021-04-28 NOTE — TOC Initial Note (Signed)
Transition of Care [facility]) - Initial/Assessment Note    Patient Details  Name: Darrell Santiago MRN: [id number] Date of Birth: 1965-07-22  [facility]) CM/SW Contact:    Candie Chroman, LCSW Phone Number: 04/28/2021, 9:16 AM  Clinical Narrative:   CSW called patient in the room, introduced role, and explained that discharge planning would be discussed. PCP is Orland Mustard, MD. Patient typically drives himself to appointments. [facility] [place]. No issues obtaining medications. No home health or DME use prior to admission. He is not on oxygen at home. Currently on 2 L. Will follow for this potential need. Patient agreeable to SA resources. Will put on chart to be sent home with him at discharge. No further concerns. CSW encouraged patient to contact CSW as needed. CSW will continue to follow patient for support and facilitate return home when stable.               Expected Discharge Plan: Home/Self Care Barriers to Discharge: Continued Medical Work up   Patient Goals and CMS Choice        Expected Discharge Plan and Services Expected Discharge Plan: Home/Self Care     Post Acute Care Choice: NA Living arrangements for the past 2 months: Single Family Home                                      Prior Living Arrangements/Services Living arrangements for the past 2 months: Single Family Home Lives with:: Spouse Patient language and need for interpreter reviewed:: Yes Do you feel safe going back to the place where you live?: Yes      Need for Family Participation in Patient Care: Yes (Comment) Care giver support system in place?: Yes (comment)   Criminal Activity/Legal Involvement Pertinent to Current Situation/Hospitalization: No - Comment as needed  Activities of Daily Living Home Assistive Devices/Equipment: None ADL Screening (condition at time of admission) Patient's cognitive ability adequate to safely complete daily activities?: Yes Is  the patient deaf or have difficulty hearing?: No Does the patient have difficulty seeing, even when wearing glasses/contacts?: No Does the patient have difficulty concentrating, remembering, or making decisions?: No Patient able to express need for assistance with ADLs?: Yes Does the patient have difficulty dressing or bathing?: No Independently performs ADLs?: Yes (appropriate for developmental age) Does the patient have difficulty walking or climbing stairs?: Yes Weakness of Legs: Both Weakness of Arms/Hands: None  Permission Sought/Granted                  Emotional Assessment Appearance:: Appears stated age Attitude/Demeanor/Rapport: Engaged, Gracious Affect (typically observed): Accepting, Appropriate, Calm, Pleasant Orientation: : Oriented to Self, Oriented to Place, Oriented to  Time, Oriented to Situation Alcohol / Substance Use: Alcohol Use Psych Involvement: No (comment)  Admission diagnosis:  Shortness of breath [R06.02] Hypokalemia [E87.6] Patient Active Problem List   Diagnosis Date Noted   Hypokalemia 04/26/2021   Abnormal LFTs 04/26/2021   Shortness of breath 04/26/2021   Neck mass 07/15/2020   Annual physical exam 07/15/2020   Tobacco dependence 07/15/2020   History of suicidal ideation 05/29/2020   Alcohol dependence with alcohol-induced mood disorder ([place]) 05/26/2020   Chronic maxillary sinusitis 05/26/2020   Brain atrophy ([place]) 05/26/2020   Depression, recurrent ([place]) 04/29/2020   Overweight (BMI 25.0-29.9) 09/05/2019   Alcohol-induced acute pancreatitis 02/07/2019   Atherosclerosis of native coronary artery of native  heart with stable angina pectoris ([place]) 09/03/2018   Ground glass opacity present on imaging of lung 08/21/2018   Lung nodule 08/21/2018   CAD (coronary artery disease) 08/21/2018   Pleural effusion 08/02/2018   COPD (chronic obstructive pulmonary disease) ([place]) 08/02/2018   Pancreatic pleural effusion 08/02/2018   Alcohol abuse  07/30/2018   Pancreatic pseudocyst    Pneumonia 07/23/2018   Bilateral impacted cerumen 05/29/2018   Pancreatitis 03/02/2018   Hepatic steatosis 01/19/2018   Stress fracture of left foot 01/19/2018   Elevated liver enzymes 09/19/2017   Hyperlipidemia 09/19/2017   Macrocytosis without anemia 06/05/2017   Anxiety and depression 04/13/2017   HTN (hypertension) 04/13/2017   Abnormal EKG 04/13/2017   Tobacco abuse 02/08/2017   Skin mass 08/12/2016   Neuropathy 02/05/2015   Gastroesophageal reflux disease 02/05/2015   Environmental allergies 02/05/2015   PCP:  McLean-Scocuzza, Nino Glow, MD Pharmacy:   [id number] [place] [place] [address] Phone: [phone] Fax: [facility] #[id number] [place], [place] - [address] [facility] [address] [place] [address] Phone: [phone] Fax: [phone]     Social Determinants of Health (SDOH) Interventions    Readmission Risk Interventions No flowsheet data found.

## 2021-04-29 ENCOUNTER — Encounter: Payer: Self-pay | Admitting: Internal Medicine

## 2021-04-29 LAB — CBC
HCT: 44.5 % (ref 39.0–52.0)
Hemoglobin: 15.3 g/dL (ref 13.0–17.0)
MCH: 35.8 pg — ABNORMAL HIGH (ref 26.0–34.0)
MCHC: 34.4 g/dL (ref 30.0–36.0)
MCV: 104.2 fL — ABNORMAL HIGH (ref 80.0–100.0)
Platelets: 91 10*3/uL — ABNORMAL LOW (ref 150–400)
RBC: 4.27 MIL/uL (ref 4.22–5.81)
RDW: 13.2 % (ref 11.5–15.5)
WBC: 6.2 10*3/uL (ref 4.0–10.5)
nRBC: 0 % (ref 0.0–0.2)

## 2021-04-29 LAB — COMPREHENSIVE METABOLIC PANEL
ALT: 30 U/L (ref 0–44)
AST: 44 U/L — ABNORMAL HIGH (ref 15–41)
Albumin: 4 g/dL (ref 3.5–5.0)
Alkaline Phosphatase: 88 U/L (ref 38–126)
Anion gap: 8 (ref 5–15)
BUN: 7 mg/dL (ref 6–20)
CO2: 27 mmol/L (ref 22–32)
Calcium: 9.3 mg/dL (ref 8.9–10.3)
Chloride: 102 mmol/L (ref 98–111)
Creatinine, Ser: 0.45 mg/dL — ABNORMAL LOW (ref 0.61–1.24)
GFR, Estimated: >60 mL/min (ref >60)
Glucose, Bld: 176 mg/dL — ABNORMAL HIGH (ref 70–99)
Potassium: 3.4 mmol/L — ABNORMAL LOW (ref 3.5–5.1)
Sodium: 137 mmol/L (ref 135–145)
Total Bilirubin: 1.2 mg/dL (ref 0.3–1.2)
Total Protein: 7.3 g/dL (ref 6.5–8.1)

## 2021-04-29 MED ORDER — POTASSIUM CHLORIDE CRYS ER 20 MEQ PO TBCR
40.0000 meq | EXTENDED_RELEASE_TABLET | ORAL | Status: AC
  Administered 2021-04-29 (×2): 40 meq via ORAL
  Filled 2021-04-29 (×2): qty 2

## 2021-04-29 NOTE — Progress Notes (Signed)
PROGRESS NOTE    Darrell Santiago  QIH:[id number] DOB: [birth year] DOA: 04/26/2021 PCP: McLean-Scocuzza, Nino Glow, MD   Brief Narrative: 56 year old with past medical history significant for COPD, CAD, hypertension, anxiety and depression, alcohol use disorder who presents to the ED with shortness of breath, hypoxia oxygen saturation 80% at home.  Patient was diagnosed with COVID 18/06/2021, he was prescribed mulnupirovir.  He presents with persistent shortness of breath and hypoxia.  He is not on home oxygen.  Initially his oxygen saturation was reported 84 percent in triage subsequently increased to 95%.  Repeated COVID test negative.  CTA was negative for PE.  CT abdomen and pelvis showed changes of chronic pancreatitis.  Hepatic asteatosis.   Assessment & Plan:   Principal Problem:   Shortness of breath Active Problems:   Anxiety and depression   HTN (hypertension)   Hepatic steatosis   COPD (chronic obstructive pulmonary disease) (HCC)   CAD (coronary artery disease)   Alcohol dependence with alcohol-induced mood disorder (HCC)   Hypokalemia   Abnormal LFTs  1-Acute on Chronic COPD exacerbation: -Patient presents with shortness of breath, hypoxia, bilateral rhonchorous and wheezing on lung exam. -CTA negative for PE. -Continue with duo-neb,  prednisone. -Continue with Dulera substitution for Symbicort -Recently treated for COVID.  Repeated COVID test negative.  Likely recover from [place]. -Improved, wean of oxygen today as possible. Check oxygen on ambulation.   2-Hypokalemia: Replete orally.  Mag level. 1.9  Alcohol dependence and withdrawal: Continue with CIWA protocol. Continue with thiamine and folic acid. CIWA score: 5.   Transaminases, fatty infiltration of the liver: Need counseling in regards alcohol intake.  Trending down.   History of chronic pancreatitis: Asymptomatic. On diet  Anxiety depression: continue  sertraline.   Hypertension: Hold amlodipine,   Systolic blood pressure soft.  CAD: Continue with aspirin.  Hold statins due to transaminases.  Mild elevation of troponin: Grade 3 diastolic dysfunction by echo.  No wall motion abnormality.  Thrombocytopenia: Likely secondary to alcohol use.  Estimated body mass index is 25.49 kg/m as calculated from the following:   Height as of this encounter: 5\' 9"  (1.753 m).   Weight as of this encounter: 78.3 kg.   DVT prophylaxis: Lovenox Code Status: Full code Family Communication: Care discussed with patient Disposition Plan:  Status is:  Inpatient.   The patient will require care spanning > 2 midnights and should be moved to inpatient because: Admitted with COPD exacerbation, alcohol withdrawal       Consultants:  None  Procedures:  ECHO  Antimicrobials:    Subjective: He is alert, he is breathing better. He has less tremors.    Objective: Vitals:   04/29/21 0445 04/29/21 0848 04/29/21 1136 04/29/21 1247  BP: 126/70 128/80 129/78   Pulse: 61 98 89   Resp:  16 17   Temp: 98.7 F (37.1 C) 98.1 F (36.7 C) 98 F (36.7 C)   TempSrc:  Oral    SpO2: 99% 100% 99% 98%  Weight:      Height:        Intake/Output Summary (Last 24 hours) at 04/29/2021 1437 Last data filed at 04/29/2021 1300 Gross per 24 hour  Intake 477 ml  Output 200 ml  Net 277 ml    Filed Weights   04/26/21 1928 04/27/21 0340  Weight: 86.2 kg 78.3 kg    Examination:  General exam: NAD Respiratory system: CTA Cardiovascular system:  S 1, S 2 RRR Gastrointestinal system: BS present, soft, nt  Central nervous system: alert, follows command Extremities: no edema    Data Reviewed: I have personally reviewed following labs and imaging studies  CBC: Recent Labs  Lab 04/26/21 1933 04/28/21 0404 04/29/21 0627  WBC 6.0 6.3 6.2  NEUTROABS 2.7  --   --   HGB 17.2* 14.8 15.3  HCT 49.5 43.1 44.5  MCV 102.7* 104.1* 104.2*  PLT 127* 90* 91*    Basic Metabolic Panel: Recent Labs  Lab  04/26/21 1933 04/27/21 0516 04/28/21 0404 04/29/21 0627  NA 142 142 137 137  K 2.6* 2.7* 2.9* 3.4*  CL 99 102 97* 102  CO2 30 28 33* 27  GLUCOSE 108* 80 96 176*  BUN 8 8 7 7   CREATININE 0.59* 0.53* 0.47* 0.45*  CALCIUM 10.2 8.9 8.8* 9.3  MG 1.8  --  1.9  --     GFR: Estimated Creatinine Clearance: 106.8 mL/min (A) (by C-G formula based on SCr of 0.45 mg/dL (L)). Liver Function Tests: Recent Labs  Lab 04/26/21 1933 04/28/21 0404 04/29/21 0627  AST 111* 49* 44*  ALT 46* 29 30  ALKPHOS 97 81 88  BILITOT 1.0 2.0* 1.2  PROT 7.4 6.3* 7.3  ALBUMIN 4.4 3.8 4.0    Recent Labs  Lab 04/27/21 0516  LIPASE 45    No results for input(s): AMMONIA in the last 168 hours. Coagulation Profile: No results for input(s): INR, PROTIME in the last 168 hours. Cardiac Enzymes: No results for input(s): CKTOTAL, CKMB, CKMBINDEX, TROPONINI in the last 168 hours. BNP (last 3 results) No results for input(s): PROBNP in the last 8760 hours. HbA1C: No results for input(s): HGBA1C in the last 72 hours. CBG: No results for input(s): GLUCAP in the last 168 hours. Lipid Profile: No results for input(s): CHOL, HDL, LDLCALC, TRIG, CHOLHDL, LDLDIRECT in the last 72 hours. Thyroid Function Tests: No results for input(s): TSH, T4TOTAL, FREET4, T3FREE, THYROIDAB in the last 72 hours. Anemia Panel: No results for input(s): VITAMINB12, FOLATE, FERRITIN, TIBC, IRON, RETICCTPCT in the last 72 hours. Sepsis Labs: No results for input(s): PROCALCITON, LATICACIDVEN in the last 168 hours.  Recent Results (from the past 240 hour(s))  Resp Panel by RT-PCR (Flu A&B, Covid) Nasopharyngeal Swab     Status: None   Collection Time: 04/26/21  8:52 PM   Specimen: Nasopharyngeal Swab; Nasopharyngeal(NP) swabs in vial transport medium  Result Value Ref Range Status   SARS Coronavirus 2 by RT PCR NEGATIVE NEGATIVE Final    Comment: (NOTE) SARS-CoV-2 target nucleic acids are NOT DETECTED.  The SARS-CoV-2 RNA is  generally detectable in upper respiratory specimens during the acute phase of infection. The lowest concentration of SARS-CoV-2 viral copies this assay can detect is 138 copies/mL. A negative result does not preclude SARS-Cov-2 infection and should not be used as the sole basis for treatment or other patient management decisions. A negative result may occur with  improper specimen collection/handling, submission of specimen other than nasopharyngeal swab, presence of viral mutation(s) within the areas targeted by this assay, and inadequate number of viral copies(<138 copies/mL). A negative result must be combined with clinical observations, patient history, and epidemiological information. The expected result is Negative.  Fact Sheet for Patients:  EntrepreneurPulse.com.au  Fact Sheet for Healthcare Providers:  IncredibleEmployment.be  This test is no t yet approved or cleared by the [place] FDA and  has been authorized for detection and/or diagnosis of SARS-CoV-2 by FDA under an Emergency Use Authorization (EUA). This EUA will remain  in effect (  meaning this test can be used) for the duration of the COVID-19 declaration under Section 564(b)(1) of the Act, 21 U.S.C.section 360bbb-3(b)(1), unless the authorization is terminated  or revoked sooner.       Influenza A by PCR NEGATIVE NEGATIVE Final   Influenza B by PCR NEGATIVE NEGATIVE Final    Comment: (NOTE) The Xpert Xpress SARS-CoV-2/FLU/RSV plus assay is intended as an aid in the diagnosis of influenza from Nasopharyngeal swab specimens and should not be used as a sole basis for treatment. Nasal washings and aspirates are unacceptable for Xpert Xpress SARS-CoV-2/FLU/RSV testing.  Fact Sheet for Patients: EntrepreneurPulse.com.au  Fact Sheet for Healthcare Providers: IncredibleEmployment.be  This test is not yet approved or cleared by the [place] FDA and has been authorized for detection and/or diagnosis of SARS-CoV-2 by FDA under an Emergency Use Authorization (EUA). This EUA will remain in effect (meaning this test can be used) for the duration of the COVID-19 declaration under Section 564(b)(1) of the Act, 21 U.S.C. section 360bbb-3(b)(1), unless the authorization is terminated or revoked.  Performed at [facility], [address], [place],  [address]           Radiology Studies: No results found.      Scheduled Meds:  aspirin  81 mg Oral Daily   enoxaparin (LOVENOX) injection  40 mg Subcutaneous X21J   folic acid  1 mg Oral Daily   guaiFENesin  600 mg Oral BID   ipratropium-albuterol  3 mL Nebulization Q6H   LORazepam  0-4 mg Intravenous Q12H   mometasone-formoterol  2 puff Inhalation BID   multivitamin with minerals  1 tablet Oral Daily   pantoprazole (PROTONIX) IV  40 mg Intravenous Q24H   potassium chloride  40 mEq Oral Q4H   predniSONE  40 mg Oral Q breakfast   sertraline  50 mg Oral Q1200   thiamine  100 mg Oral Daily   Or   thiamine  100 mg Intravenous Daily   Continuous Infusions:   LOS: 1 day    Time spent: 35 minutes     Albin Duckett A Norvin Ohlin, MD [facility]   If 7PM-7AM, please contact night-coverage [email or web address]  04/29/2021, 2:37 PM

## 2021-04-29 NOTE — Plan of Care (Signed)

## 2021-04-29 NOTE — Plan of Care (Signed)
°  Problem: Education: Goal: Knowledge of General Education information will improve Description: Including pain rating scale, medication(s)/side effects and non-pharmacologic comfort measures Outcome: Progressing   Problem: Clinical Measurements: Goal: Respiratory complications will improve Outcome: Progressing   Problem: Clinical Measurements: Goal: Cardiovascular complication will be avoided Outcome: Progressing   Problem: Coping: Goal: Level of anxiety will decrease Outcome: Progressing

## 2021-04-29 NOTE — Evaluation (Signed)
Physical Therapy Evaluation Patient Details Name: Darrell Santiago MRN: [id number] DOB: 25-Apr-1965 Today's Date: 04/29/2021  History of Present Illness  56 year old with past medical history significant for COPD, CAD, hypertension, anxiety and depression, alcohol use disorder who presented to [facility] with shortness of breath, hypoxia oxygen saturation 80% at home.  Patient was diagnosed with COVID 1-2 weeks prior to admission, negative this admission.  Clinical Impression  Pt sleeping on room air on arrival, sats in the high 90s, remained in the high 90s t/o eval and ambulation w/o supplemental O2.  He was able do all bed mobility and ambulation w/o assist or safety concerns but did have elevated HR t/o the effort.  It quickly rose to the 120-130s range but stayed relatively stable t/o the effort.   Pt is not at his baseline but will not likely need further PT follow up.  Discussed easing back into his normally very physical job and not overdoing it, safe to return home from PT stand point.  Discussed case with nursing and encouraged ambulation QD with them.  No PT needs, will sign off.   Recommendations for follow up therapy are one component of a multi-disciplinary discharge planning process, led by the attending physician.  Recommendations may be updated based on patient status, additional functional criteria and insurance authorization.  Follow Up Recommendations No PT follow up    Assistance Recommended at Discharge None  Patient can return home with the following       Equipment Recommendations Rolling walker (2 wheels)  Recommendations for Other Services       Functional Status Assessment Patient has not had a recent decline in their functional status     Precautions / Restrictions Precautions Precautions: Fall (mod) Restrictions Weight Bearing Restrictions: No      Mobility  Bed Mobility Overal bed mobility: Independent                  Transfers Overall transfer  level: Independent                 General transfer comment: Pt was able to easily get to/from standing w/o assist or safety concerns    Ambulation/Gait Ambulation/Gait assistance: Independent Gait Distance (Feet): 200 Feet Assistive device: None         General Gait Details: Pt with community appropriate ambulation that he self reports as slower and more guarded than baseline.  Pt on room air with sats staying in the high 90s t/o the effort, however HR jumps from 80s-90s at rest, to 120-130s during ambulation.  Subjectively reports no real fatigue or other symptoms.  Stairs            Wheelchair Mobility    Modified [name] (Stroke Patients Only)       Balance                                             Pertinent Vitals/Pain Pain Assessment: No/denies pain    Home Living Family/patient expects to be discharged to:: Private residence Living Arrangements: Spouse/significant other Available Help at Discharge: Available PRN/intermittently;Family (wife works OOH)   Home Access: Level entry       Home Layout: Able to live on main level with bedroom/bathroom        Prior Function Prior Level of Function : Independent/Modified Independent;Working/employed  Mobility Comments: works Chief Financial [employer]        Extremity/Trunk Assessment   Upper Extremity Assessment Upper Extremity Assessment: Overall WFL for tasks assessed    Lower Extremity Assessment Lower Extremity Assessment: Overall WFL for tasks assessed       Communication   Communication: No difficulties  Cognition Arousal/Alertness: Awake/alert Behavior During Therapy: WFL for tasks assessed/performed Overall Cognitive Status: Within Functional Limits for tasks assessed                                          General Comments      Exercises     Assessment/Plan    PT Assessment Patient does not need any  further PT services  PT Problem List         PT Treatment Interventions      PT Goals (Current goals can be found in the Care Plan section)  Acute Rehab PT Goals Patient Stated Goal: Go home PT Goal Formulation: All assessment and education complete, DC therapy    Frequency       Co-evaluation               AM-PAC PT "6 Clicks" Mobility  Outcome Measure Help needed turning from your back to your side while in a flat bed without using bedrails?: None Help needed moving from lying on your back to sitting on the side of a flat bed without using bedrails?: None Help needed moving to and from a bed to a chair (including a wheelchair)?: None Help needed standing up from a chair using your arms (e.g., wheelchair or bedside chair)?: None Help needed to walk in hospital room?: None Help needed climbing 3-5 steps with a railing? : None 6 Click Score: 24    End of Session   Activity Tolerance: Patient tolerated treatment well Patient left: in bed;with call bell/phone within reach Nurse Communication: Mobility status PT Visit Diagnosis: Muscle weakness (generalized) (M62.81);Difficulty in walking, not elsewhere classified (R26.2)    Time: 8416-6063 PT Time Calculation (min) (ACUTE ONLY): 17 min   Charges:   PT Evaluation $PT Eval Low Complexity: 1 Low          Kreg Shropshire, DPT 04/29/2021, 3:29 PM

## 2021-04-30 LAB — BASIC METABOLIC PANEL
Anion gap: 9 (ref 5–15)
BUN: 12 mg/dL (ref 6–20)
CO2: 25 mmol/L (ref 22–32)
Calcium: 9.3 mg/dL (ref 8.9–10.3)
Chloride: 101 mmol/L (ref 98–111)
Creatinine, Ser: 0.48 mg/dL — ABNORMAL LOW (ref 0.61–1.24)
GFR, Estimated: >60 mL/min (ref >60)
Glucose, Bld: 105 mg/dL — ABNORMAL HIGH (ref 70–99)
Potassium: 3.1 mmol/L — ABNORMAL LOW (ref 3.5–5.1)
Sodium: 135 mmol/L (ref 135–145)

## 2021-04-30 LAB — CBC
HCT: 43.3 % (ref 39.0–52.0)
Hemoglobin: 14.9 g/dL (ref 13.0–17.0)
MCH: 36.3 pg — ABNORMAL HIGH (ref 26.0–34.0)
MCHC: 34.4 g/dL (ref 30.0–36.0)
MCV: 105.6 fL — ABNORMAL HIGH (ref 80.0–100.0)
Platelets: 97 10*3/uL — ABNORMAL LOW (ref 150–400)
RBC: 4.1 MIL/uL — ABNORMAL LOW (ref 4.22–5.81)
RDW: 13.5 % (ref 11.5–15.5)
WBC: 8.3 10*3/uL (ref 4.0–10.5)
nRBC: 0 % (ref 0.0–0.2)

## 2021-04-30 MED ORDER — IPRATROPIUM-ALBUTEROL 0.5-2.5 (3) MG/3ML IN SOLN: 3.0000 mL | Freq: Four times a day (QID) | RESPIRATORY_TRACT | 10 refills | Status: DC

## 2021-04-30 MED ORDER — FOLIC ACID 1 MG PO TABS: 1.0000 mg | ORAL_TABLET | Freq: Every day | ORAL | 3 refills | Status: DC

## 2021-04-30 MED ORDER — BUDESONIDE-FORMOTEROL FUMARATE 160-4.5 MCG/ACT IN AERO: 2.0000 | INHALATION_SPRAY | Freq: Two times a day (BID) | RESPIRATORY_TRACT | 2 refills | Status: DC

## 2021-04-30 MED ORDER — POTASSIUM CHLORIDE CRYS ER 20 MEQ PO TBCR
40.0000 meq | EXTENDED_RELEASE_TABLET | ORAL | Status: DC
  Administered 2021-04-30: 40 meq via ORAL
  Filled 2021-04-30: qty 2

## 2021-04-30 MED ORDER — POTASSIUM CHLORIDE CRYS ER 20 MEQ PO TBCR: 40.0000 meq | EXTENDED_RELEASE_TABLET | Freq: Two times a day (BID) | ORAL | 0 refills | Status: DC

## 2021-04-30 MED ORDER — PREDNISONE 20 MG PO TABS: 40.0000 mg | ORAL_TABLET | Freq: Every day | ORAL | 0 refills | Status: AC

## 2021-04-30 MED ORDER — THIAMINE HCL 100 MG PO TABS: 100.0000 mg | ORAL_TABLET | Freq: Every day | ORAL | 0 refills | Status: DC

## 2021-04-30 MED ORDER — GUAIFENESIN ER 600 MG PO TB12: 600.0000 mg | ORAL_TABLET | Freq: Two times a day (BID) | ORAL | 0 refills | Status: DC

## 2021-04-30 NOTE — Plan of Care (Signed)

## 2021-04-30 NOTE — Discharge Summary (Signed)
Physician Discharge Summary  Darrell Santiago DQQ:[id number] DOB: 1965-06-18 DOA: 04/26/2021  PCP: McLean-Scocuzza, Nino Glow, MD  Admit date: 04/26/2021 Discharge date: 04/30/2021  Admitted From: Home Disposition:  Home   Recommendations for Outpatient Follow-up:  Follow up with PCP in 1-2 weeks Please obtain BMP/CBC in one week Encourage alcohol cessation   Discharge Condition: Stable.  CODE STATUS: Full code Diet recommendation: Heart Healthy   Brief/Interim Summary: 56 year old with past medical history significant for COPD, CAD, hypertension, anxiety and depression, alcohol use disorder who presents to the ED with shortness of breath, hypoxia oxygen saturation 80% at home.  Patient was diagnosed with COVID 18/06/2021, he was prescribed mulnupirovir.  He presents with persistent shortness of breath and hypoxia.  He is not on home oxygen.  Initially his oxygen saturation was reported 84 percent in triage subsequently increased to 95%.  Repeated COVID test negative.  CTA was negative for PE.  CT abdomen and pelvis showed changes of chronic pancreatitis.  Hepatic asteatosis.    1-Acute on Chronic COPD exacerbation: -Patient presents with shortness of breath, hypoxia, bilateral rhonchorous and wheezing on lung exam. -CTA negative for PE. -Treated  with duo-neb,  prednisone. -Continue with Dulera substitution for Symbicort -Recently treated for COVID.  Repeated COVID test negative.  Likely recover from [place]. -off oxygen.  Improved. Discharge on prednisone taper, nebulizer.   2-Hypokalemia: Replete orally. Will provide prescription at discharge.  Mag level. 1.9   Alcohol dependence and withdrawal: Continue with CIWA protocol. Continue with thiamine and folic acid. Resolved. Encourage alcohol cessation    Transaminases, fatty infiltration of the liver: Need counseling in regards alcohol intake.  Trending down.    History of chronic pancreatitis: Asymptomatic. On diet   Anxiety  depression: continue  sertraline.    Hypertension: Hold amlodipine,  Systolic blood pressure soft. Resume cozaar.    CAD: Continue with aspirin.  resume statins at discharge. LFT almost normalized.    Mild elevation of troponin: Grade 3 diastolic dysfunction by echo.  No wall motion abnormality.  Thrombocytopenia: Likely secondary to alcohol use.  Stable.   Chronic Diastolic HF. Compensated.   Discharge Diagnoses:  Principal Problem:   Shortness of breath Active Problems:   Anxiety and depression   HTN (hypertension)   Hepatic steatosis   COPD (chronic obstructive pulmonary disease) (HCC)   CAD (coronary artery disease)   Alcohol dependence with alcohol-induced mood disorder (HCC)   Hypokalemia   Abnormal LFTs    Discharge Instructions  Discharge Instructions     Diet - low sodium heart healthy   Complete by: As directed    Increase activity slowly   Complete by: As directed       Allergies as of 04/30/2021   No Known Allergies      Medication List     STOP taking these medications    amLODipine 2.5 MG tablet Commonly known as: Norvasc   LORazepam 0.5 MG tablet Commonly known as: ATIVAN   naproxen 500 MG tablet Commonly known as: Naprosyn       TAKE these medications    acetaminophen 325 MG tablet Commonly known as: TYLENOL Take 2 tablets (650 mg total) by mouth every 6 (six) hours as needed for mild pain (or Fever >/= 101).   aspirin 81 MG chewable tablet Chew by mouth.   budesonide-formoterol 160-4.5 MCG/ACT inhaler Commonly known as: Symbicort Inhale 2 puffs into the lungs 2 (two) times daily. Rinse mouth after use   folic acid 1 MG tablet Commonly  known as: FOLVITE Take 1 tablet (1 mg total) by mouth daily. Start taking on: May 01, 2021   guaiFENesin 600 MG 12 hr tablet Commonly known as: MUCINEX Take 1 tablet (600 mg total) by mouth 2 (two) times daily.   hydrOXYzine 25 MG tablet Commonly known as: ATARAX TAKE 1 TABLET BY  MOUTH DAILY AS NEEDED.   ipratropium-albuterol 0.5-2.5 (3) MG/3ML Soln Commonly known as: DUONEB Inhale 3 mLs into the lungs every 6 (six) hours as needed.   ipratropium-albuterol 0.5-2.5 (3) MG/3ML Soln Commonly known as: DUONEB Inhale 3 mLs into the lungs 4 (four) times daily for 5000 doses.   losartan 50 MG tablet Commonly known as: COZAAR Take 1 tablet (50 mg total) by mouth daily.   Multi-Vitamin tablet Take by mouth.   pantoprazole 20 MG tablet Commonly known as: PROTONIX Take 1 tablet (20 mg total) by mouth daily. 30 minutes before food   potassium chloride SA 20 MEQ tablet Commonly known as: KLOR-CON M Take 2 tablets (40 mEq total) by mouth 2 (two) times daily for 3 days.   pravastatin 40 MG tablet Commonly known as: PRAVACHOL Take 1 tablet (40 mg total) by mouth at bedtime.   predniSONE 20 MG tablet Commonly known as: DELTASONE Take 2 tablets (40 mg total) by mouth daily with breakfast for 5 days. Start taking on: May 01, 2021   sertraline 50 MG tablet Commonly known as: ZOLOFT Take 1 tablet (50 mg total) by mouth daily at 12 noon. Note tablet changed from 1/2 of 100 mg to 50 mg qd   thiamine 100 MG tablet Take 1 tablet (100 mg total) by mouth daily. Start taking on: May 01, 2021   vitamin B-12 1000 MCG tablet Commonly known as: CYANOCOBALAMIN Take 1,000 mcg by mouth daily.        No Known Allergies  Consultations: None   Procedures/Studies: DG Chest 2 View  Result Date: 04/26/2021 CLINICAL DATA:  Shortness of breath EXAM: CHEST - 2 VIEW COMPARISON:  07/25/2018 FINDINGS: The heart size and mediastinal contours are within normal limits. Both lungs are clear. The visualized skeletal structures are unremarkable. IMPRESSION: No active cardiopulmonary disease. Electronically Signed   By: Rolm Baptise M.D.   On: 04/26/2021 20:02   CT Angio Chest PE W and/or Wo Contrast  Result Date: 04/26/2021 CLINICAL DATA:  Pulmonary embolism (PE) suspected,  high prob. Shortness of breath, chest pain EXAM: CT ANGIOGRAPHY CHEST WITH CONTRAST TECHNIQUE: Multidetector CT imaging of the chest was performed using the standard protocol during bolus administration of intravenous contrast. Multiplanar CT image reconstructions and MIPs were obtained to evaluate the vascular anatomy. CONTRAST:  176mL OMNIPAQUE IOHEXOL 350 MG/ML SOLN COMPARISON:  08/13/2018 FINDINGS: Cardiovascular: No filling defects in the pulmonary arteries to suggest pulmonary emboli. Heart is normal size. Aorta is normal caliber. Scattered aortic calcifications. Mediastinum/Nodes: No mediastinal, hilar, or axillary adenopathy. Trachea and esophagus are unremarkable. Thyroid unremarkable. Lungs/Pleura: Lungs are clear. No focal airspace opacities or suspicious nodules. No effusions. Upper Abdomen: Imaging into the upper abdomen demonstrates no acute findings. Musculoskeletal: Bilateral gynecomastia. Old left rib fractures. No acute bony abnormality. Review of the MIP images confirms the above findings. IMPRESSION: No evidence of pulmonary embolus. No acute cardiopulmonary disease. Bilateral gynecomastia. Aortic Atherosclerosis (ICD10-I70.0). Electronically Signed   By: Rolm Baptise M.D.   On: 04/26/2021 22:02   CT ABDOMEN PELVIS W CONTRAST  Result Date: 04/26/2021 CLINICAL DATA:  Shortness of breath, chest pain EXAM: CT ABDOMEN AND PELVIS WITH CONTRAST  TECHNIQUE: Multidetector CT imaging of the abdomen and pelvis was performed using the standard protocol following bolus administration of intravenous contrast. CONTRAST:  150mL OMNIPAQUE IOHEXOL 350 MG/ML SOLN COMPARISON:  07/24/2018 FINDINGS: Lower chest: Lung bases are clear. No effusions. Heart is normal size. Hepatobiliary: Prior cholecystectomy. Diffuse fatty infiltration of the liver. No focal hepatic abnormality. Pancreas: Calcifications in the pancreatic head and body compatible with chronic pancreatitis. No ductal dilatation or changes of acute  pancreatitis. Spleen: No focal abnormality.  Normal size. Adrenals/Urinary Tract: Low-density nodule in the left adrenal gland compatible with adenoma, stable. Small cysts in the left kidney. No stones or hydronephrosis. Urinary bladder unremarkable. Stomach/Bowel: Sigmoid diverticulosis. No active diverticulitis. Stomach and small bowel decompressed. Normal appendix. Vascular/Lymphatic: Aortic atherosclerosis. No evidence of aneurysm or adenopathy. Reproductive: No visible focal abnormality. Other: No free fluid or free air. Musculoskeletal: No acute bony abnormality. IMPRESSION: Hepatic steatosis. Changes of chronic pancreatitis.  No evidence of acute pancreatitis. Sigmoid diverticulosis.  No active diverticulitis. Aortic atherosclerosis. No acute findings. Electronically Signed   By: Rolm Baptise M.D.   On: 04/26/2021 21:59   ECHOCARDIOGRAM COMPLETE  Result Date: 04/27/2021    ECHOCARDIOGRAM REPORT   Patient Name:   ADREN DOLLINS Date of Exam: 04/27/2021 Medical Rec #:  [id number]      Height:       69.0 in Accession #:    [id number]     Weight:       172.6 lb Date of Birth:  June 12, 1965      BSA:          1.941 m Patient Age:    56 years       BP:           121/73 mmHg Patient Gender: M              HR:           64 bpm. Exam Location:  [facility] Procedure: 2D Echo, Color Doppler and Cardiac Doppler Indications:     Chest pain R07.9  History:         Patient has no prior history of Echocardiogram examinations.                  Risk Factors:Hypertension and Dyslipidemia.  Sonographer:     Sherrie Sport Referring Phys:  Romeo Diagnosing Phys: Neoma Laming  Sonographer Comments: Suboptimal parasternal window. IMPRESSIONS  1. Left ventricular ejection fraction, by estimation, is 60 to 65%. The left ventricle has normal function. The left ventricle has no regional wall motion abnormalities. There is mild concentric left ventricular hypertrophy. Left ventricular diastolic parameters are consistent with Grade III  diastolic dysfunction (restrictive).  2. Right ventricular systolic function is normal. The right ventricular size is normal.  3. Left atrial size was mildly dilated.  4. Right atrial size was mildly dilated.  5. The mitral valve is normal in structure. Mild mitral valve regurgitation. No evidence of mitral stenosis.  6. The aortic valve is normal in structure. Aortic valve regurgitation is not visualized. No aortic stenosis is present.  7. The inferior vena cava is normal in size with greater than 50% respiratory variability, suggesting right atrial pressure of 3 mmHg. FINDINGS  Left Ventricle: Left ventricular ejection fraction, by estimation, is 60 to 65%. The left ventricle has normal function. The left ventricle has no regional wall motion abnormalities. The left ventricular internal cavity size was normal in size. There is  mild concentric left ventricular hypertrophy.  Left ventricular diastolic parameters are consistent with Grade III diastolic dysfunction (restrictive). Right Ventricle: The right ventricular size is normal. No increase in right ventricular wall thickness. Right ventricular systolic function is normal. Left Atrium: Left atrial size was mildly dilated. Right Atrium: Right atrial size was mildly dilated. Pericardium: There is no evidence of pericardial effusion. Mitral Valve: The mitral valve is normal in structure. Mild mitral valve regurgitation. No evidence of mitral valve stenosis. MV peak gradient, 5.7 mmHg. The mean mitral valve gradient is 3.0 mmHg. Tricuspid Valve: The tricuspid valve is normal in structure. Tricuspid valve regurgitation is mild . No evidence of tricuspid stenosis. Aortic Valve: The aortic valve is normal in structure. Aortic valve regurgitation is not visualized. No aortic stenosis is present. Aortic valve mean gradient measures 4.0 mmHg. Aortic valve peak gradient measures 7.4 mmHg. Aortic valve area, by VTI measures 2.97 cm. Pulmonic Valve: The pulmonic valve was  normal in structure. Pulmonic valve regurgitation is not visualized. No evidence of pulmonic stenosis. Aorta: The aortic root is normal in size and structure. Venous: The inferior vena cava is normal in size with greater than 50% respiratory variability, suggesting right atrial pressure of 3 mmHg. IAS/Shunts: No atrial level shunt detected by color flow Doppler.  LEFT VENTRICLE PLAX 2D LVIDd:         5.10 cm   Diastology LVIDs:         3.20 cm   LV e' medial:    5.77 cm/s LV PW:         1.10 cm   LV E/e' medial:  20.6 LV IVS:        0.85 cm   LV e' lateral:   8.92 cm/s LVOT diam:     2.20 cm   LV E/e' lateral: 13.3 LV SV:         83 LV SV Index:   43 LVOT Area:     3.80 cm  RIGHT VENTRICLE RV Basal diam:  3.30 cm RV S prime:     12.20 cm/s TAPSE (M-mode): 2.7 cm LEFT ATRIUM             Index        RIGHT ATRIUM           Index LA diam:        3.30 cm 1.70 cm/m   RA Area:     17.00 cm LA Vol (A2C):   64.7 ml 33.34 ml/m  RA Volume:   43.20 ml  22.26 ml/m LA Vol (A4C):   56.9 ml 29.32 ml/m LA Biplane Vol: 63.3 ml 32.62 ml/m  AORTIC VALVE                    PULMONIC VALVE AV Area (Vmax):    2.88 cm     PV Vmax:        0.72 m/s AV Area (Vmean):   2.70 cm     PV Vmean:       49.700 cm/s AV Area (VTI):     2.97 cm     PV VTI:         0.173 m AV Vmax:           136.00 cm/s  PV Peak grad:   2.1 mmHg AV Vmean:          94.800 cm/s  PV Mean grad:   1.0 mmHg AV VTI:            0.280 m  RVOT Peak grad: 3 mmHg AV Peak Grad:      7.4 mmHg AV Mean Grad:      4.0 mmHg LVOT Vmax:         103.00 cm/s LVOT Vmean:        67.300 cm/s LVOT VTI:          0.219 m LVOT/AV VTI ratio: 0.78  AORTA Ao Root diam: 3.37 cm MITRAL VALVE                TRICUSPID VALVE MV Area (PHT): 3.97 cm     TR Peak grad:   23.4 mmHg MV Area VTI:   2.56 cm     TR Vmax:        242.00 cm/s MV Peak grad:  5.7 mmHg MV Mean grad:  3.0 mmHg     SHUNTS MV Vmax:       1.19 m/s     Systemic VTI:  0.22 m MV Vmean:      78.9 cm/s    Systemic Diam: 2.20 cm MV  Decel Time: 191 msec     Pulmonic VTI:  0.184 m MV E velocity: 119.00 cm/s MV A velocity: 99.40 cm/s MV E/A ratio:  1.20 Shaukat Khan Electronically signed by Neoma Laming Signature Date/Time: 04/27/2021/9:55:42 PM    Final    (Echo, Carotid, EGD, Colonoscopy, ERCP)    Subjective:   Discharge Exam: Vitals:   04/30/21 0716 04/30/21 0748  BP:  128/89  Pulse:  83  Resp:  16  Temp:  97.9 F (36.6 C)  SpO2: 96% 100%     General: Pt is alert, awake, not in acute distress Cardiovascular: RRR, S1/S2 +, no rubs, no gallops Respiratory: CTA bilaterally, no wheezing, no rhonchi Abdominal: Soft, NT, ND, bowel sounds + Extremities: no edema, no cyanosis    The results of significant diagnostics from this hospitalization (including imaging, microbiology, ancillary and laboratory) are listed below for reference.     Microbiology: Recent Results (from the past 240 hour(s))  Resp Panel by RT-PCR (Flu A&B, Covid) Nasopharyngeal Swab     Status: None   Collection Time: 04/26/21  8:52 PM   Specimen: Nasopharyngeal Swab; Nasopharyngeal(NP) swabs in vial transport medium  Result Value Ref Range Status   SARS Coronavirus 2 by RT PCR NEGATIVE NEGATIVE Final    Comment: (NOTE) SARS-CoV-2 target nucleic acids are NOT DETECTED.  The SARS-CoV-2 RNA is generally detectable in upper respiratory specimens during the acute phase of infection. The lowest concentration of SARS-CoV-2 viral copies this assay can detect is 138 copies/mL. A negative result does not preclude SARS-Cov-2 infection and should not be used as the sole basis for treatment or other patient management decisions. A negative result may occur with  improper specimen collection/handling, submission of specimen other than nasopharyngeal swab, presence of viral mutation(s) within the areas targeted by this assay, and inadequate number of viral copies(<138 copies/mL). A negative result must be combined with clinical observations, patient  history, and epidemiological information. The expected result is Negative.  Fact Sheet for Patients:  EntrepreneurPulse.com.au  Fact Sheet for Healthcare Providers:  IncredibleEmployment.be  This test is no t yet approved or cleared by the [place] FDA and  has been authorized for detection and/or diagnosis of SARS-CoV-2 by FDA under an Emergency Use Authorization (EUA). This EUA will remain  in effect (meaning this test can be used) for the duration of the COVID-19 declaration under Section 564(b)(1) of the Act, 21 U.S.C.section 360bbb-3(b)(1), unless the  authorization is terminated  or revoked sooner.       Influenza A by PCR NEGATIVE NEGATIVE Final   Influenza B by PCR NEGATIVE NEGATIVE Final    Comment: (NOTE) The Xpert Xpress SARS-CoV-2/FLU/RSV plus assay is intended as an aid in the diagnosis of influenza from Nasopharyngeal swab specimens and should not be used as a sole basis for treatment. Nasal washings and aspirates are unacceptable for Xpert Xpress SARS-CoV-2/FLU/RSV testing.  Fact Sheet for Patients: EntrepreneurPulse.com.au  Fact Sheet for Healthcare Providers: IncredibleEmployment.be  This test is not yet approved or cleared by the [place] FDA and has been authorized for detection and/or diagnosis of SARS-CoV-2 by FDA under an Emergency Use Authorization (EUA). This EUA will remain in effect (meaning this test can be used) for the duration of the COVID-19 declaration under Section 564(b)(1) of the Act, 21 U.S.C. section 360bbb-3(b)(1), unless the authorization is terminated or revoked.  Performed at [facility], [address] [place], [place] [address]      Labs: BNP (last 3 results) Recent Labs    04/26/21 1933  BNP 01.7   Basic Metabolic Panel: Recent Labs  Lab 04/26/21 1933 04/27/21 0516 04/28/21 0404 04/29/21 0627 04/30/21 0800  NA 142 142 137  137 135  K 2.6* 2.7* 2.9* 3.4* 3.1*  CL 99 102 97* 102 101  CO2 30 28 33* 27 25  GLUCOSE 108* 80 96 176* 105*  BUN 8 8 7 7 12   CREATININE 0.59* 0.53* 0.47* 0.45* 0.48*  CALCIUM 10.2 8.9 8.8* 9.3 9.3  MG 1.8  --  1.9  --   --    Liver Function Tests: Recent Labs  Lab 04/26/21 1933 04/28/21 0404 04/29/21 0627  AST 111* 49* 44*  ALT 46* 29 30  ALKPHOS 97 81 88  BILITOT 1.0 2.0* 1.2  PROT 7.4 6.3* 7.3  ALBUMIN 4.4 3.8 4.0   Recent Labs  Lab 04/27/21 0516  LIPASE 45   No results for input(s): AMMONIA in the last 168 hours. CBC: Recent Labs  Lab 04/26/21 1933 04/28/21 0404 04/29/21 0627 04/30/21 0800  WBC 6.0 6.3 6.2 8.3  NEUTROABS 2.7  --   --   --   HGB 17.2* 14.8 15.3 14.9  HCT 49.5 43.1 44.5 43.3  MCV 102.7* 104.1* 104.2* 105.6*  PLT 127* 90* 91* 97*   Cardiac Enzymes: No results for input(s): CKTOTAL, CKMB, CKMBINDEX, TROPONINI in the last 168 hours. BNP: Invalid input(s): POCBNP CBG: No results for input(s): GLUCAP in the last 168 hours. D-Dimer No results for input(s): DDIMER in the last 72 hours. Hgb A1c No results for input(s): HGBA1C in the last 72 hours. Lipid Profile No results for input(s): CHOL, HDL, LDLCALC, TRIG, CHOLHDL, LDLDIRECT in the last 72 hours. Thyroid function studies No results for input(s): TSH, T4TOTAL, T3FREE, THYROIDAB in the last 72 hours.  Invalid input(s): FREET3 Anemia work up No results for input(s): VITAMINB12, FOLATE, FERRITIN, TIBC, IRON, RETICCTPCT in the last 72 hours. Urinalysis    Component Value Date/Time   COLORURINE DARK YELLOW 08/27/2018 0908   APPEARANCEUR CLOUDY (A) 08/27/2018 0908   LABSPEC 1.022 08/27/2018 0908   PHURINE 6.5 08/27/2018 0908   GLUCOSEU CANCELED 05/22/2020 0812   GLUCOSEU NEGATIVE 06/05/2017 0852   HGBUR NEGATIVE 08/27/2018 0908   BILIRUBINUR NEGATIVE 07/23/2018 1826   KETONESUR NEGATIVE 08/27/2018 0908   PROTEINUR CANCELED 05/22/2020 0812   PROTEINUR TRACE (A) 08/27/2018 0908    UROBILINOGEN 1.0 06/05/2017 0852   NITRITE NEGATIVE 08/27/2018  0908   LEUKOCYTESUR TRACE (A) 08/27/2018 0908   Sepsis Labs Invalid input(s): PROCALCITONIN,  WBC,  LACTICIDVEN Microbiology Recent Results (from the past 240 hour(s))  Resp Panel by RT-PCR (Flu A&B, Covid) Nasopharyngeal Swab     Status: None   Collection Time: 04/26/21  8:52 PM   Specimen: Nasopharyngeal Swab; Nasopharyngeal(NP) swabs in vial transport medium  Result Value Ref Range Status   SARS Coronavirus 2 by RT PCR NEGATIVE NEGATIVE Final    Comment: (NOTE) SARS-CoV-2 target nucleic acids are NOT DETECTED.  The SARS-CoV-2 RNA is generally detectable in upper respiratory specimens during the acute phase of infection. The lowest concentration of SARS-CoV-2 viral copies this assay can detect is 138 copies/mL. A negative result does not preclude SARS-Cov-2 infection and should not be used as the sole basis for treatment or other patient management decisions. A negative result may occur with  improper specimen collection/handling, submission of specimen other than nasopharyngeal swab, presence of viral mutation(s) within the areas targeted by this assay, and inadequate number of viral copies(<138 copies/mL). A negative result must be combined with clinical observations, patient history, and epidemiological information. The expected result is Negative.  Fact Sheet for Patients:  EntrepreneurPulse.com.au  Fact Sheet for Healthcare Providers:  IncredibleEmployment.be  This test is no t yet approved or cleared by the [place] FDA and  has been authorized for detection and/or diagnosis of SARS-CoV-2 by FDA under an Emergency Use Authorization (EUA). This EUA will remain  in effect (meaning this test can be used) for the duration of the COVID-19 declaration under Section 564(b)(1) of the Act, 21 U.S.C.section 360bbb-3(b)(1), unless the authorization is terminated  or revoked  sooner.       Influenza A by PCR NEGATIVE NEGATIVE Final   Influenza B by PCR NEGATIVE NEGATIVE Final    Comment: (NOTE) The Xpert Xpress SARS-CoV-2/FLU/RSV plus assay is intended as an aid in the diagnosis of influenza from Nasopharyngeal swab specimens and should not be used as a sole basis for treatment. Nasal washings and aspirates are unacceptable for Xpert Xpress SARS-CoV-2/FLU/RSV testing.  Fact Sheet for Patients: EntrepreneurPulse.com.au  Fact Sheet for Healthcare Providers: IncredibleEmployment.be  This test is not yet approved or cleared by the [place] FDA and has been authorized for detection and/or diagnosis of SARS-CoV-2 by FDA under an Emergency Use Authorization (EUA). This EUA will remain in effect (meaning this test can be used) for the duration of the COVID-19 declaration under Section 564(b)(1) of the Act, 21 U.S.C. section 360bbb-3(b)(1), unless the authorization is terminated or revoked.  Performed at [facility], [address] [place], [place] [address]      Time coordinating discharge: 40 minutes  SIGNED:   Elmarie Shiley, MD  [facility]

## 2021-05-04 ENCOUNTER — Telehealth: Payer: Self-pay

## 2021-05-04 NOTE — Telephone Encounter (Addendum)
Transition Care Management Unsuccessful Follow-up Telephone Call  Date of discharge and from where:  [facility] 04-30-21 Dx: COPD exac  Attempts:  1st Attempt  Reason for unsuccessful TCM follow-up call:  Unable to leave message- home number   Transition Care Management Unsuccessful Follow-up Telephone Call  Date of discharge and from where:  [facility] 04-30-21 Dx: COPD exac  Attempts:  2nd Attempt  Reason for unsuccessful TCM follow-up call:  Left voice message  Transition Care Management Follow-up Telephone Call Date of discharge and from where: [facility] 04-30-21 Dx: COPD exac How have you been since you were released from the hospital? Been doing really good Any questions or concerns? No  Items Reviewed: Did the pt receive and understand the discharge instructions provided? Yes  Medications obtained and verified? Yes  Other? No  Any new allergies since your discharge? No  Dietary orders reviewed? Yes Do you have support at home? Yes   Home Care and Equipment/Supplies: Were home health services ordered? no If so, what is the name of the agency? na  Has the agency set up a time to come to the patient's home? not applicable Were any new equipment or medical supplies ordered?  No What is the name of the medical supply agency? na Were you able to get the supplies/equipment? no Do you have any questions related to the use of the equipment or supplies? No  Functional Questionnaire: (I = Independent and D = Dependent) ADLs: I  Bathing/Dressing- I  Meal Prep- I  Eating- I  Maintaining continence- I  Transferring/Ambulation- I  Managing Meds- I  Follow up appointments reviewed:  PCP Hospital f/u appt confirmed? Yes  Scheduled to see Dr Terese Door on 05-07-21 @ [facility] f/u appt confirmed? No . Are transportation arrangements needed? No  If their condition worsens, is the pt aware to call PCP or go to the Emergency Dept.? Yes Was the patient  provided with contact information for the PCP's office or ED? Yes Was to pt encouraged to call back with questions or concerns? Yes

## 2021-05-04 NOTE — Telephone Encounter (Signed)
Patient called back, returning a call  but patient advise has appointment schedule  for Friday,  If still need to reach patient call back to [phone]

## 2021-05-07 ENCOUNTER — Other Ambulatory Visit: Payer: Self-pay | Admitting: Internal Medicine

## 2021-05-07 ENCOUNTER — Encounter: Payer: Self-pay | Admitting: Internal Medicine

## 2021-05-07 ENCOUNTER — Other Ambulatory Visit: Payer: Self-pay

## 2021-05-07 ENCOUNTER — Ambulatory Visit: Payer: BC Managed Care – PPO | Admitting: Internal Medicine

## 2021-05-07 VITALS — BP 130/78 | HR 77 | Temp 98.3°F | Ht 69.0 in | Wt 181.6 lb

## 2021-05-07 DIAGNOSIS — D696 Thrombocytopenia, unspecified: Secondary | ICD-10-CM

## 2021-05-07 DIAGNOSIS — J441 Chronic obstructive pulmonary disease with (acute) exacerbation: Secondary | ICD-10-CM | POA: Diagnosis not present

## 2021-05-07 DIAGNOSIS — E876 Hypokalemia: Secondary | ICD-10-CM

## 2021-05-07 DIAGNOSIS — H00015 Hordeolum externum left lower eyelid: Secondary | ICD-10-CM

## 2021-05-07 DIAGNOSIS — R748 Abnormal levels of other serum enzymes: Secondary | ICD-10-CM

## 2021-05-07 LAB — CBC WITH DIFFERENTIAL/PLATELET
Basophils Absolute: 0.1 10*3/uL (ref 0.0–0.1)
Basophils Relative: 0.9 % (ref 0.0–3.0)
Eosinophils Absolute: 0.2 10*3/uL (ref 0.0–0.7)
Eosinophils Relative: 2.3 % (ref 0.0–5.0)
HCT: 44 % (ref 39.0–52.0)
Hemoglobin: 15 g/dL (ref 13.0–17.0)
Lymphocytes Relative: 32.6 % (ref 12.0–46.0)
Lymphs Abs: 2.4 10*3/uL (ref 0.7–4.0)
MCHC: 34.1 g/dL (ref 30.0–36.0)
MCV: 106.4 fl — ABNORMAL HIGH (ref 78.0–100.0)
Monocytes Absolute: 0.8 10*3/uL (ref 0.1–1.0)
Monocytes Relative: 10.3 % (ref 3.0–12.0)
Neutro Abs: 4 10*3/uL (ref 1.4–7.7)
Neutrophils Relative %: 53.9 % (ref 43.0–77.0)
Platelets: 224 10*3/uL (ref 150.0–400.0)
RBC: 4.13 Mil/uL — ABNORMAL LOW (ref 4.22–5.81)
RDW: 14.5 % (ref 11.5–15.5)
WBC: 7.5 10*3/uL (ref 4.0–10.5)

## 2021-05-07 LAB — BASIC METABOLIC PANEL
BUN: 8 mg/dL (ref 6–23)
CO2: 31 mEq/L (ref 19–32)
Calcium: 9.6 mg/dL (ref 8.4–10.5)
Chloride: 105 mEq/L (ref 96–112)
Creatinine, Ser: 0.63 mg/dL (ref 0.40–1.50)
GFR: 108.46 mL/min (ref >60.00)
Glucose, Bld: 72 mg/dL (ref 70–99)
Potassium: 3.4 mEq/L — ABNORMAL LOW (ref 3.5–5.1)
Sodium: 144 mEq/L (ref 135–145)

## 2021-05-07 LAB — MAGNESIUM: Magnesium: 1.6 mg/dL (ref 1.5–2.5)

## 2021-05-07 MED ORDER — POTASSIUM CHLORIDE CRYS ER 20 MEQ PO TBCR: 20.0000 meq | EXTENDED_RELEASE_TABLET | Freq: Every day | ORAL | 1 refills | Status: DC

## 2021-05-07 MED ORDER — POTASSIUM CHLORIDE CRYS ER 20 MEQ PO TBCR: 40.0000 meq | EXTENDED_RELEASE_TABLET | Freq: Every day | ORAL | 3 refills | Status: DC

## 2021-05-07 MED ORDER — ERYTHROMYCIN 5 MG/GM OP OINT: 1.0000 "application " | TOPICAL_OINTMENT | Freq: Three times a day (TID) | OPHTHALMIC | 0 refills | Status: DC

## 2021-05-07 MED ORDER — DOXYCYCLINE HYCLATE 100 MG PO TABS: 100.0000 mg | ORAL_TABLET | Freq: Two times a day (BID) | ORAL | 0 refills | Status: DC

## 2021-05-07 NOTE — Addendum Note (Signed)
Addended by: Orland Mustard on: 05/07/2021 05:09 PM   Modules accepted: Orders

## 2021-05-07 NOTE — Progress Notes (Signed)
Chief Complaint  Patient presents with   Hospitalization Follow-up   HFU  S/p covid with copd exacerbation with hypoxia to the <90 O2 sat and hypokalemia and low mag in [facility] 04/26/21 to 04/30/21 doing well back to work on 05/03/21 off today    Review of Systems  Constitutional:  Negative for weight loss.  HENT:  Negative for hearing loss.   Eyes:  Negative for blurred vision.  Respiratory:  Negative for shortness of breath.   Cardiovascular:  Negative for chest pain.  Gastrointestinal:  Negative for abdominal pain and blood in stool.  Musculoskeletal:  Negative for back pain.  Skin:  Negative for rash.  Neurological:  Negative for headaches.  Psychiatric/Behavioral:  Negative for depression.   Past Medical History:  Diagnosis Date   Abnormal EKG    Allergy    Seasonal   Anxiety    Chicken pox    COVID-19    13/23   Depression    GERD (gastroesophageal reflux disease)    Hyperlipidemia    Hypertension    Urine incontinence    Past Surgical History:  Procedure Laterality Date   CHOLECYSTECTOMY  2000   LEFT HEART CATH AND CORONARY ANGIOGRAPHY N/A 01/31/2017   Procedure: LEFT HEART CATH AND CORONARY ANGIOGRAPHY;  Surgeon: Dionisio David, MD;  Location: [facility];  Service: Cardiovascular;  Laterality: N/A;   Family History  Problem Relation Age of Onset   Alcohol abuse Father    Diabetes Father    Cancer Father        ?stage IV thyroid with met   Cancer Maternal Grandfather        Colon Cancer   Social History   Socioeconomic History   Marital status: Married    Spouse name: Not on file   Number of children: Not on file   Years of education: Not on file   Highest education level: Not on file  Occupational History   Not on file  Tobacco Use   Smoking status: Every Day    Packs/day: 1.00    Years: 26.00    Pack years: 26.00    Types: Cigarettes   Smokeless tobacco: Never   Tobacco comments:    little less than one ppd  Substance and Sexual  Activity   Alcohol use: Yes    Alcohol/week: 21.0 standard drinks    Types: 21 Shots of liquor per week    Comment: 1 pt will last 1 week    Drug use: No   Sexual activity: Yes    Partners: Female  Other Topics Concern   Not on file  Social History Narrative   Married   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   amLODipine (NORVASC) 2.5 MG tablet Take 2.5 mg by mouth daily.   aspirin 81 MG chewable tablet Chew by mouth.  budesonide-formoterol (SYMBICORT) 160-4.5 MCG/ACT inhaler Inhale 2 puffs into the lungs 2 (two) times daily. Rinse mouth after use  ° doxycycline (VIBRA-TABS) 100 MG tablet Take 1 tablet (100 mg total) by mouth 2 (two) times daily. With food  ° erythromycin ophthalmic ointment Place 1 application into the left eye 3 (three) times daily. 4-7 days  ° folic acid (FOLVITE) 1 MG tablet Take 1 tablet (1 mg total) by mouth daily.  ° hydrOXYzine (ATARAX/VISTARIL) 25 MG tablet TAKE 1 TABLET BY MOUTH DAILY AS NEEDED.  ° ipratropium-albuterol (DUONEB) 0.5-2.5 (3) MG/3ML SOLN Inhale 3 mLs into the lungs every 6 (six) hours as needed.  ° losartan (COZAAR) 50 MG tablet Take 1 tablet (50 mg total) by mouth daily.  ° Multiple Vitamin (MULTI-VITAMIN) tablet Take by mouth.  ° pantoprazole (PROTONIX) 20 MG tablet Take 1 tablet (20 mg total) by mouth daily. 30 minutes before food  ° pravastatin (PRAVACHOL) 40 MG tablet Take 1 tablet (40 mg  total) by mouth at bedtime.  ° sertraline (ZOLOFT) 50 MG tablet Take 1 tablet (50 mg total) by mouth daily at 12 noon. Note tablet changed from 1/2 of 100 mg to 50 mg qd  ° vitamin B-12 (CYANOCOBALAMIN) 1000 MCG tablet Take 1,000 mcg by mouth daily.   ° °No Known Allergies °Recent Results (from the past 2160 hour(s))  °Blood gas, venous     Status: Abnormal  ° Collection Time: 04/26/21  7:33 PM  °Result Value Ref Range  ° pH, Ven 7.49 (H) 7.250 - 7.430  ° pCO2, Ven 45 44.0 - 60.0 mmHg  ° pO2, Ven 34.0 32.0 - 45.0 mmHg  ° Bicarbonate 34.3 (H) 20.0 - 28.0 mmol/L  ° Acid-Base Excess 9.4 (H) 0.0 - 2.0 mmol/L  ° O2 Saturation 71.6 %  ° Patient temperature 37.0   ° Collection site VEIN   ° Sample type VENOUS   °  Comment: Performed at [facility], [address] [place], [place] [address]  °CBC with Differential     Status: Abnormal  ° Collection Time: 04/26/21  7:33 PM  °Result Value Ref Range  ° WBC 6.0 4.0 - 10.5 K/uL  ° RBC 4.82 4.22 - 5.81 MIL/uL  ° Hemoglobin 17.2 (H) 13.0 - 17.0 g/dL  ° HCT 49.5 39.0 - 52.0 %  ° MCV 102.7 (H) 80.0 - 100.0 fL  ° MCH 35.7 (H) 26.0 - 34.0 pg  ° MCHC 34.7 30.0 - 36.0 g/dL  ° RDW 14.4 11.5 - 15.5 %  ° Platelets 127 (L) 150 - 400 K/uL  ° nRBC 0.0 0.0 - 0.2 %  ° Neutrophils Relative % 44 %  ° Neutro Abs 2.7 1.7 - 7.7 K/uL  ° Lymphocytes Relative 43 %  ° Lymphs Abs 2.6 0.7 - 4.0 K/uL  ° Monocytes Relative 10 %  ° Monocytes Absolute 0.6 0.1 - 1.0 K/uL  ° Eosinophils Relative 2 %  ° Eosinophils Absolute 0.1 0.0 - 0.5 K/uL  ° Basophils Relative 1 %  ° Basophils Absolute 0.1 0.0 - 0.1 K/uL  ° Immature Granulocytes 0 %  ° Abs Immature Granulocytes 0.02 0.00 - 0.07 K/uL  °  Comment: Performed at [facility], [address] [place], [place] [address]  °Brain natriuretic peptide     Status: None  ° Collection Time: 04/26/21  7:33 PM  °Result Value Ref Range  ° B Natriuretic Peptide 16.4 0.0 - 100.0 pg/mL  °  Comment: Performed at [facility], [address] [place], [place] [address]  °Comprehensive metabolic panel     Status: Abnormal  ° Collection Time: 04/26/21  7:33 PM  °Result Value Ref Range  ° Sodium 142 135 - 145 mmol/L  ° Potassium 2.6 (LL) 3.5 - 5.1 mmol/L  °  Comment: CRITICAL RESULT CALLED TO, READ BACK BY AND VERIFIED WITH °VANESSA ASHLEY AT 2014 ON 04/26/21 BY SS °  ° Chloride 99 98 - 111 mmol/L  ° CO2 30 22 - 32 mmol/L  ° Glucose, Bld 108 (H) 70 - 99 mg/dL  °  Comment: Glucose reference range applies only to samples taken after fasting for at least 8 hours.  ° BUN 8 6 - 20 mg/dL  ° Creatinine, Ser 0.59 (L) 0.61 - 1.24 mg/dL  ° Calcium 10.2 8.9 - 10.3 mg/dL  ° Total Protein 7.4 6.5 - 8.1 g/dL  ° Albumin 4.4 3.5 - 5.0 g/dL  ° AST 111 (H) 15 - 41 U/L  ° ALT 46 (H) 0 - 44 U/L  ° Alkaline Phosphatase 97 38 - 126 U/L  ° Total Bilirubin 1.0 0.3 - 1.2 mg/dL  ° GFR, Estimated >60 >60 mL/min  °  Comment: (NOTE) °Calculated using the CKD-EPI Creatinine Equation (2021) °  ° Anion gap 13 5 - 15  °  Comment: Performed at [facility], [address] [place], [place] [address]  °Troponin I (High Sensitivity)     Status: None  ° Collection Time: 04/26/21  7:33 PM  °Result Value Ref Range  ° Troponin I (High Sensitivity) 7 <18 ng/L  °  Comment: (NOTE) °Elevated high sensitivity troponin I (hsTnI) values and significant  °changes across serial measurements may suggest ACS but many other  °chronic and acute conditions are known to elevate hsTnI results.  °Refer to the "Links" section for chest pain algorithms and additional  °guidance. °Performed at [facility], [address] [place], °[place] [address] °  °Magnesium     Status: None  ° Collection Time: 04/26/21  7:33 PM  °Result Value Ref Range  ° Magnesium 1.8 1.7 - 2.4 mg/dL  °  Comment: Performed at  Hospital Lab, [address] , [place] [address]  °Resp Panel by RT-PCR (Flu A&B, Covid) Nasopharyngeal Swab     Status: None  ° Collection Time: 04/26/21  8:52 PM  ° Specimen:  Nasopharyngeal Swab; Nasopharyngeal(NP) swabs in vial transport medium  °Result Value Ref Range  ° SARS Coronavirus 2 by RT PCR NEGATIVE NEGATIVE  °  Comment: (NOTE) °SARS-CoV-2 target nucleic acids are NOT DETECTED. ° °The SARS-CoV-2 RNA is generally detectable in upper respiratory °specimens during the acute phase of infection. The lowest °concentration of SARS-CoV-2 viral copies this assay can detect is °138 copies/mL. A negative result does not preclude SARS-Cov-2 °infection and should not be used as the sole basis for treatment or °other patient management decisions. A negative result may occur with  °improper specimen collection/handling, submission of specimen other °than nasopharyngeal swab, presence of viral mutation(s) within the °areas targeted by this assay, and inadequate number of viral °copies(<138 copies/mL). A negative result must be combined with °clinical observations, patient history, and epidemiological °information. The expected result is Negative. ° °Fact Sheet for Patients:  °https://www.fda.gov/media/152166/download ° °Fact Sheet for Healthcare Providers:  °https://www.fda.gov/media/152162/download ° °This test is no t yet approved or cleared by the [place] FDA and  °has been authorized for detection and/or diagnosis of SARS-CoV-2 by °FDA under an Emergency Use Authorization (EUA). This EUA will   remain  °in effect (meaning this test can be used) for the duration of the °COVID-19 declaration under Section 564(b)(1) of the Act, 21 °U.S.C.section 360bbb-3(b)(1), unless the authorization is terminated  °or revoked sooner.  ° ° °  ° Influenza A by PCR NEGATIVE NEGATIVE  ° Influenza B by PCR NEGATIVE NEGATIVE  °  Comment: (NOTE) °The Xpert Xpress SARS-CoV-2/FLU/RSV plus assay is intended as an aid °in the diagnosis of influenza from Nasopharyngeal swab specimens and °should not be used as a sole basis for treatment. Nasal washings and °aspirates are unacceptable for Xpert Xpress  SARS-CoV-2/FLU/RSV °testing. ° °Fact Sheet for Patients: °https://www.fda.gov/media/152166/download ° °Fact Sheet for Healthcare Providers: °https://www.fda.gov/media/152162/download ° °This test is not yet approved or cleared by the [place] FDA and °has been authorized for detection and/or diagnosis of SARS-CoV-2 by °FDA under an Emergency Use Authorization (EUA). This EUA will remain °in effect (meaning this test can be used) for the duration of the °COVID-19 declaration under Section 564(b)(1) of the Act, 21 U.S.C. °section 360bbb-3(b)(1), unless the authorization is terminated or °revoked. ° °Performed at [facility], [address] [place], °[place] [address] °  °Basic metabolic panel     Status: Abnormal  ° Collection Time: 04/27/21  5:16 AM  °Result Value Ref Range  ° Sodium 142 135 - 145 mmol/L  ° Potassium 2.7 (LL) 3.5 - 5.1 mmol/L  °  Comment: CRITICAL RESULT CALLED TO, READ BACK BY AND VERIFIED WITH °BRENDA TATE@0655 04/27/21 RH °  ° Chloride 102 98 - 111 mmol/L  ° CO2 28 22 - 32 mmol/L  ° Glucose, Bld 80 70 - 99 mg/dL  °  Comment: Glucose reference range applies only to samples taken after fasting for at least 8 hours.  ° BUN 8 6 - 20 mg/dL  ° Creatinine, Ser 0.53 (L) 0.61 - 1.24 mg/dL  ° Calcium 8.9 8.9 - 10.3 mg/dL  ° GFR, Estimated >60 >60 mL/min  °  Comment: (NOTE) °Calculated using the CKD-EPI Creatinine Equation (2021) °  ° Anion gap 12 5 - 15  °  Comment: Performed at [facility], [address] [place], [place] [address]  °Troponin I (High Sensitivity)     Status: Abnormal  ° Collection Time: 04/27/21  5:16 AM  °Result Value Ref Range  ° Troponin I (High Sensitivity) 55 (H) <18 ng/L  °  Comment: READ BACK AND VERIFIED WITH BRENDA TATE@0702 04/27/21 RH °(NOTE) °Elevated high sensitivity troponin I (hsTnI) values and significant  °changes across serial measurements may suggest ACS but many other  °chronic and acute conditions are known to elevate hsTnI results.  °Refer  to the "Links" section for chest pain algorithms and additional  °guidance. °Performed at [facility], [address] [place], °[place] [address] °  °Hepatitis panel, acute     Status: None  ° Collection Time: 04/27/21  5:16 AM  °Result Value Ref Range  ° Hepatitis B Surface Ag NON REACTIVE NON REACTIVE  ° HCV Ab NON REACTIVE NON REACTIVE  °  Comment: (NOTE) °Nonreactive HCV antibody screen is consistent with no HCV infections,  °unless recent infection is suspected or other evidence exists to °indicate HCV infection. ° °  ° Hep A IgM NON REACTIVE NON REACTIVE  ° Hep B C IgM NON REACTIVE NON REACTIVE  °  Comment: Performed at  Hospital Lab, [address] [place], [place] [address]  °Lipase, blood     Status: None  ° Collection Time: 04/27/21    5:16 AM  °Result Value Ref Range  ° Lipase 45 11 - 51 U/L  °  Comment: Performed at [facility], [address] [place], [place] [address]  °HIV Antibody (routine testing w rflx)     Status: None  ° Collection Time: 04/27/21  5:16 AM  °Result Value Ref Range  ° HIV Screen 4th Generation wRfx Non Reactive Non Reactive  °  Comment: Performed at [facility], [address] [place], [place] [address]  °Troponin I (High Sensitivity)     Status: None  ° Collection Time: 04/27/21  8:36 AM  °Result Value Ref Range  ° Troponin I (High Sensitivity) 7 <18 ng/L  °  Comment: (NOTE) °Elevated high sensitivity troponin I (hsTnI) values and significant  °changes across serial measurements may suggest ACS but many other  °chronic and acute conditions are known to elevate hsTnI results.  °Refer to the "Links" section for chest pain algorithms and additional  °guidance. °Performed at [facility], [address] [place], °[place] [address] °  °Troponin I (High Sensitivity)     Status: None  ° Collection Time: 04/27/21 10:15 AM  °Result Value Ref Range  ° Troponin I (High Sensitivity) 7 <18 ng/L  °  Comment: (NOTE) °Elevated high sensitivity  troponin I (hsTnI) values and significant  °changes across serial measurements may suggest ACS but many other  °chronic and acute conditions are known to elevate hsTnI results.  °Refer to the "Links" section for chest pain algorithms and additional  °guidance. °Performed at [facility], [address] [place], °[place] [address] °  °ECHOCARDIOGRAM COMPLETE     Status: None  ° Collection Time: 04/27/21  2:09 PM  °Result Value Ref Range  ° Weight 2,761.92 oz  ° Height 69 in  ° BP 121/73 mmHg  ° Ao pk vel 1.36 m/s  ° AV Area VTI 2.97 cm2  ° AR max vel 2.88 cm2  ° AV Mean grad 4.0 mmHg  ° AV Peak grad 7.4 mmHg  ° S' Lateral 3.20 cm  ° AV Area mean vel 2.70 cm2  ° Area-P 1/2 3.97 cm2  ° MV VTI 2.56 cm2  °CBC     Status: Abnormal  ° Collection Time: 04/28/21  4:04 AM  °Result Value Ref Range  ° WBC 6.3 4.0 - 10.5 K/uL  ° RBC 4.14 (L) 4.22 - 5.81 MIL/uL  ° Hemoglobin 14.8 13.0 - 17.0 g/dL  ° HCT 43.1 39.0 - 52.0 %  ° MCV 104.1 (H) 80.0 - 100.0 fL  ° MCH 35.7 (H) 26.0 - 34.0 pg  ° MCHC 34.3 30.0 - 36.0 g/dL  ° RDW 13.4 11.5 - 15.5 %  ° Platelets 90 (L) 150 - 400 K/uL  °  Comment: Immature Platelet Fraction may be °clinically indicated, consider °ordering this additional test °[id number] °REPEATED TO VERIFY °  ° nRBC 0.0 0.0 - 0.2 %  °  Comment: Performed at [facility], [address] [place], [place] [address]  °Comprehensive metabolic panel     Status: Abnormal  ° Collection Time: 04/28/21  4:04 AM  °Result Value Ref Range  ° Sodium 137 135 - 145 mmol/L  ° Potassium 2.9 (L) 3.5 - 5.1 mmol/L  ° Chloride 97 (L) 98 - 111 mmol/L  ° CO2 33 (H) 22 - 32 mmol/L  ° Glucose, Bld 96 70 - 99 mg/dL  °  Comment: Glucose reference range applies only to samples taken after fasting for at   least 8 hours.   BUN 7 6 - 20 mg/dL   Creatinine, Ser 0.47 (L) 0.61 - 1.24 mg/dL   Calcium 8.8 (L) 8.9 - 10.3 mg/dL   Total Protein 6.3 (L) 6.5 - 8.1 g/dL   Albumin 3.8 3.5 - 5.0 g/dL   AST 49 (H) 15 - 41 U/L   ALT 29 0 - 44  U/L   Alkaline Phosphatase 81 38 - 126 U/L   Total Bilirubin 2.0 (H) 0.3 - 1.2 mg/dL   GFR, Estimated >60 >60 mL/min    Comment: (NOTE) Calculated using the CKD-EPI Creatinine Equation (2021)    Anion gap 7 5 - 15    Comment: Performed at [facility], [address] [place], [place] [address]  Magnesium     Status: None   Collection Time: 04/28/21  4:04 AM  Result Value Ref Range   Magnesium 1.9 1.7 - 2.4 mg/dL    Comment: Performed at [facility], [address] [place], [place] [address]  CBC     Status: Abnormal   Collection Time: 04/29/21  6:27 AM  Result Value Ref Range   WBC 6.2 4.0 - 10.5 K/uL   RBC 4.27 4.22 - 5.81 MIL/uL   Hemoglobin 15.3 13.0 - 17.0 g/dL   HCT 44.5 39.0 - 52.0 %   MCV 104.2 (H) 80.0 - 100.0 fL   MCH 35.8 (H) 26.0 - 34.0 pg   MCHC 34.4 30.0 - 36.0 g/dL   RDW 13.2 11.5 - 15.5 %   Platelets 91 (L) 150 - 400 K/uL    Comment: Immature Platelet Fraction may be clinically indicated, consider ordering this additional test [id number] REPEATED TO VERIFY    nRBC 0.0 0.0 - 0.2 %    Comment: Performed at [facility], [address] [place], [place] [address]  Comprehensive metabolic panel     Status: Abnormal   Collection Time: 04/29/21  6:27 AM  Result Value Ref Range   Sodium 137 135 - 145 mmol/L   Potassium 3.4 (L) 3.5 - 5.1 mmol/L   Chloride 102 98 - 111 mmol/L   CO2 27 22 - 32 mmol/L   Glucose, Bld 176 (H) 70 - 99 mg/dL    Comment: Glucose reference range applies only to samples taken after fasting for at least 8 hours.   BUN 7 6 - 20 mg/dL   Creatinine, Ser 0.45 (L) 0.61 - 1.24 mg/dL   Calcium 9.3 8.9 - 10.3 mg/dL   Total Protein 7.3 6.5 - 8.1 g/dL   Albumin 4.0 3.5 - 5.0 g/dL   AST 44 (H) 15 - 41 U/L   ALT 30 0 - 44 U/L   Alkaline Phosphatase 88 38 - 126 U/L   Total Bilirubin 1.2 0.3 - 1.2 mg/dL   GFR, Estimated >60 >60 mL/min    Comment: (NOTE) Calculated using the CKD-EPI Creatinine Equation (2021)     Anion gap 8 5 - 15    Comment: Performed at [facility], [address] [place], [place] [address]  CBC     Status: Abnormal   Collection Time: 04/30/21  8:00 AM  Result Value Ref Range   WBC 8.3 4.0 - 10.5 K/uL   RBC 4.10 (L) 4.22 - 5.81 MIL/uL   Hemoglobin 14.9 13.0 - 17.0 g/dL   HCT 43.3 39.0 - 52.0 %   MCV 105.6 (H) 80.0 - 100.0 fL   MCH 36.3 (H) 26.0 - 34.0 pg   MCHC 34.4 30.0 -  36.0 g/dL   RDW 13.5 11.5 - 15.5 %   Platelets 97 (L) 150 - 400 K/uL    Comment: Immature Platelet Fraction may be clinically indicated, consider ordering this additional test [id number]    nRBC 0.0 0.0 - 0.2 %    Comment: Performed at [facility], [address] [place], [place] [address]  Basic metabolic panel     Status: Abnormal   Collection Time: 04/30/21  8:00 AM  Result Value Ref Range   Sodium 135 135 - 145 mmol/L   Potassium 3.1 (L) 3.5 - 5.1 mmol/L   Chloride 101 98 - 111 mmol/L   CO2 25 22 - 32 mmol/L   Glucose, Bld 105 (H) 70 - 99 mg/dL    Comment: Glucose reference range applies only to samples taken after fasting for at least 8 hours.   BUN 12 6 - 20 mg/dL   Creatinine, Ser 0.48 (L) 0.61 - 1.24 mg/dL   Calcium 9.3 8.9 - 10.3 mg/dL   GFR, Estimated >60 >60 mL/min    Comment: (NOTE) Calculated using the CKD-EPI Creatinine Equation (2021)    Anion gap 9 5 - 15    Comment: Performed at [facility], [address] [place], [place] [address]   Objective  Body mass index is 26.82 kg/m. Wt Readings from Last 3 Encounters:  05/07/21 181 lb 9.6 oz (82.4 kg)  04/27/21 172 lb 9.9 oz (78.3 kg)  02/16/21 190 lb (86.2 kg)   Temp Readings from Last 3 Encounters:  05/07/21 98.3 F (36.8 C) (Temporal)  04/30/21 97.9 F (36.6 C) (Oral)  07/15/20 98 F (36.7 C) (Oral)   BP Readings from Last 3 Encounters:  05/07/21 130/78  04/30/21 128/89  02/16/21 118/67   Pulse Readings from Last 3 Encounters:  05/07/21 77  04/30/21 83  07/15/20 87     Physical Exam Vitals and nursing note reviewed.  Constitutional:      Appearance: Normal appearance. He is well-developed and well-groomed. He is obese.  HENT:     Head: Normocephalic and atraumatic.  Eyes:     Conjunctiva/sclera: Conjunctivae normal.     Pupils: Pupils are equal, round, and reactive to light.  Cardiovascular:     Rate and Rhythm: Normal rate and regular rhythm.     Heart sounds: Normal heart sounds.  Pulmonary:     Effort: Pulmonary effort is normal. No respiratory distress.     Breath sounds: Wheezing present.     Comments: Right sided   Abdominal:     Tenderness: There is no abdominal tenderness.  Musculoskeletal:     Lumbar back: Tenderness present. Negative right straight leg raise test and negative left straight leg raise test.  Skin:    General: Skin is warm and moist.  Neurological:     General: No focal deficit present.     Mental Status: He is alert and oriented to person, place, and time. Mental status is at baseline.     Sensory: Sensation is intact.     Motor: Motor function is intact.     Coordination: Coordination is intact.     Gait: Gait is intact. Gait normal.  Psychiatric:        Attention and Perception: Attention and perception normal.        Mood and Affect: Mood and affect normal.        Speech: Speech normal.        Behavior: Behavior normal. Behavior is cooperative.  Thought Content: Thought content normal.     °   Cognition and Memory: Cognition and memory normal.     °   Judgment: Judgment normal.  ° ° °Assessment  °Plan  °COPD exacerbation (HCC) resolved s/p covid O2 sat normal 97% °Symbicort prn duoneb  ° °Hordeolum externum of left lower eyelid - Plan: doxycycline (VIBRA-TABS) 100 MG tablet bid x 7-10 days °erythromycin ophthalmic ointment ° °Liver enzyme elevation likely 2/2 alcohol and fatty liver  °Rec etoh cessation  ° °Hypokalemia - Plan: Basic Metabolic Panel (BMET) °Hypomagnesemia - Plan:  Magnesium ° °Thrombocytopenia (HCC) - Plan: CBC with Differential/Platelet, Pathologist smear review  °Rec alcohol cessation  ° °HM °Declines flu shot, pna 23, covid vaccine, shingrix  °Consider prevnar vaccine °Tdap UTD  °twinrix 3/3 utd °-repeat Hep A/B titer with labs in future °Hep C negative   ° °rec smoking cessation pt has cut back  °referred colonscopy [facility] pt wants outpt  °colonoscopy referred leb Gi  sch 11/22 consult and 03/23/21 colonoscopy °-as of 05/07/21 still needs to do colonoscopy ° °PSA 0.67 normal 05/22/20 °rec healthy diet and exercise  °  °Provider: Dr. Tracy McLean-Scocuzza-Internal Medicine  °

## 2021-05-07 NOTE — Patient Instructions (Addendum)
Mrs. Epifanio Lesches let me know if you want to see ENT   Stye A stye, also known as a hordeolum, is a bump that forms on an eyelid. It may look like a pimple next to the eyelash. A stye can form inside the eyelid (internal stye) or outside the eyelid (external stye). A stye can cause redness, swelling, and pain on the eyelid. Styes are very common. Anyone can get them at any age. They usually occur in just one eye at a time, but you may have more than one in either eye. What are the causes? A stye is caused by an infection. The infection is almost always caused by bacteria called Staphylococcus aureus. This is a common type of bacteria that lives on the skin. An internal stye may result from an infected oil-producing gland inside the eyelid. An external stye may be caused by an infection at the base of the eyelash (hair follicle). What increases the risk? You are more likely to develop a stye if: You have had a stye before. You have any of these conditions: Red, itchy, inflamed eyelids (blepharitis). A skin condition such as seborrheic dermatitis or rosacea. High fat levels in your blood (lipids). Dry eyes. What are the signs or symptoms? The most common symptom of a stye is eyelid pain. Internal styes are more painful than external styes. Other symptoms may include: Painful swelling of your eyelid. A scratchy feeling in your eye. Tearing and redness of your eye. A pimple-like bump on the edge of the eyelid. Pus draining from the stye. How is this diagnosed? Your health care provider may be able to diagnose a stye just by examining your eye. The health care provider may also check to make sure: You do not have a fever or other signs of a more serious infection. The infection has not spread to other parts of your eye or areas around your eye. How is this treated? Most styes will clear up in a few days without treatment or with warm compresses applied to the area. You may need to use antibiotic  drops or ointment to treat an infection. Sometimes, steroid drops or ointment are used in addition to antibiotics. In some cases, your health care provider may give you a small steroid injection in the eyelid. If your stye does not heal with routine treatment, your health care provider may drain pus from the stye using a thin blade or needle. This may be done if the stye is large, causing a lot of pain, or affecting your vision. Follow these instructions at home: Take over-the-counter and prescription medicines only as told by your health care provider. This includes eye drops or ointments. If you were prescribed an antibiotic medicine, steroid medicine, or both, apply or use them as told by your health care provider. Do not stop using the medicine even if your condition improves. Apply a warm, wet cloth (warm compress) to your eye for 5-10 minutes, 4 to 6 times a day. Clean the affected eyelid as directed by your health care provider. Do not wear contact lenses or eye makeup until your stye has healed and your health care provider says that it is safe. Do not try to pop or drain the stye. Do not rub your eye. Contact a health care provider if: You have chills or a fever. Your stye does not go away after several days. Your stye affects your vision. Your eyeball becomes swollen, red, or painful. Get help right away if: You have pain  when moving your eye around. Summary A stye is a bump that forms on an eyelid. It may look like a pimple next to the eyelash. A stye can form inside the eyelid (internal stye) or outside the eyelid (external stye). A stye can cause redness, swelling, and pain on the eyelid. Your health care provider may be able to diagnose a stye just by examining your eye. Apply a warm, wet cloth (warm compress) to your eye for 5-10 minutes, 4 to 6 times a day. This information is not intended to replace advice given to you by your health care provider. Make sure you discuss any  questions you have with your health care provider. Document Revised: 06/10/2020 Document Reviewed: 06/10/2020 Elsevier Patient Education  [place].  Hoarseness Hoarseness, also called dysphonia, is any abnormal change in your voice that can make it difficult to speak. Your voice may sound raspy, breathy, or strained. Hoarseness is caused by a problem with your vocal cords (vocal folds). These are two bands of tissue inside your voice box (larynx). When you speak, your vocal cords move back and forth to create sound. The surfaces of your vocal cords need to be smooth for your voice to sound clear. Swelling or lumps on your vocal cords can cause hoarseness. Vocal cord problems may be the result of injuries or abnormal growths, certain diseases, upper respiratory infection, or allergies. Other causes may include medicine side effects and exposure to irritants. Follow these instructions at home: Pay attention to any changes in your symptoms. Take these actions to stay safe and to help relieve your symptoms: Lifestyle Do not eat foods that give you heartburn, such as spicy or acidic foods like hot peppers and orange juice. These foods can cause a gastroesophageal reflux that may worsen your vocal cord problems. Limit how much alcohol and caffeine you drink as told by your health care provider. Drink enough fluid to keep your urine pale yellow. Do not use any products that contain nicotine or tobacco. These products include cigarettes, chewing tobacco, and vaping devices, such as e-cigarettes. If you need help quitting, ask your health care provider. Avoid secondhand smoke. General instructions Use a humidifier if the air in your home is dry. Avoid coughing or clearing your throat. Do not whisper. Whispering can cause muscle strain. Do not speak in a loud or harsh voice. Rest your voice. If recommended by your health care provider, schedule an appointment with a speech-language specialist.  This specialist may give you methods to try that can help you avoid misusing your voice. Contact a health care provider if: Your voice is hoarse longer than 2 weeks. You almost lose or completely lose your voice for more than 3 days. You have pain when you swallow or try to talk. You feel a lump in your neck. Get help right away if: You have trouble swallowing. You feel like you are choking when you swallow. You cough up blood or vomit blood. You have trouble breathing. You choke, cannot swallow, or cannot breathe if you lie flat. You notice swelling or a rash on your body, face, or tongue. These symptoms may represent a serious problem that is an emergency. Do not wait to see if the symptoms will go away. Get medical help right away. Call your local emergency services (911 in the [place] Do not drive yourself to the hospital. Summary Hoarseness, also called dysphonia, is any abnormal change in your voice that can make it difficult to speak. Your voice may  sound raspy, breathy, or strained. Hoarseness is caused by a problem with your vocal cords (vocal folds). Do not speak in a loud or harsh voice, whisper, use nicotine or tobacco products, or eat foods that give you heartburn. See your health care provider if your hoarseness does not improve after 2 weeks. This information is not intended to replace advice given to you by your health care provider. Make sure you discuss any questions you have with your health care provider. Document Revised: 09/22/2020 Document Reviewed: 09/22/2020 Elsevier Patient Education  [place].

## 2021-05-10 LAB — PATHOLOGIST SMEAR REVIEW

## 2021-05-10 LAB — EXTRA LAV TOP TUBE

## 2021-06-29 ENCOUNTER — Ambulatory Visit: Payer: BC Managed Care – PPO | Admitting: Podiatry

## 2021-08-02 ENCOUNTER — Ambulatory Visit: Payer: BC Managed Care – PPO | Admitting: Urology

## 2021-08-02 ENCOUNTER — Encounter: Payer: Self-pay | Admitting: Urology

## 2021-09-14 ENCOUNTER — Other Ambulatory Visit: Payer: Self-pay | Admitting: Internal Medicine

## 2021-09-14 DIAGNOSIS — F419 Anxiety disorder, unspecified: Secondary | ICD-10-CM

## 2021-10-12 ENCOUNTER — Ambulatory Visit (INDEPENDENT_AMBULATORY_CARE_PROVIDER_SITE_OTHER): Payer: BC Managed Care – PPO | Admitting: Podiatry

## 2021-10-12 ENCOUNTER — Ambulatory Visit (INDEPENDENT_AMBULATORY_CARE_PROVIDER_SITE_OTHER): Payer: BC Managed Care – PPO

## 2021-10-12 DIAGNOSIS — Q666 Other congenital valgus deformities of feet: Secondary | ICD-10-CM | POA: Diagnosis not present

## 2021-10-12 DIAGNOSIS — M79671 Pain in right foot: Secondary | ICD-10-CM

## 2021-10-12 DIAGNOSIS — M722 Plantar fascial fibromatosis: Secondary | ICD-10-CM

## 2021-10-15 NOTE — Progress Notes (Signed)
Subjective:  Patient ID: Darrell Santiago, male    DOB: 18-Jun-1965,  MRN: [id number]  No chief complaint on file.   56 y.o. male presents with the above complaint.  Patient presents with bilateral heel pain that has been on for quite some time he walks all day has started to hurt him.  He is here with his wife today.  Hurts with ambulation pain scale 7 out of 10.  Hurts with taking for step in the morning.  He would like to discuss treatment options for it.  He denies any other acute complaints.   Review of Systems: Negative except as noted in the HPI. Denies N/V/F/Ch.  Past Medical History:  Diagnosis Date   Abnormal EKG    Allergy    Seasonal   Anxiety    Chicken pox    COVID-19    04/20/21   Depression    GERD (gastroesophageal reflux disease)    Hyperlipidemia    Hypertension    Urine incontinence     Current Outpatient Medications:    acetaminophen (TYLENOL) 325 MG tablet, Take 2 tablets (650 mg total) by mouth every 6 (six) hours as needed for mild pain (or Fever >/= 101)., Disp: , Rfl:    amLODipine (NORVASC) 2.5 MG tablet, Take 2.5 mg by mouth daily., Disp: , Rfl:    aspirin 81 MG chewable tablet, Chew by mouth., Disp: , Rfl:    budesonide-formoterol (SYMBICORT) 160-4.5 MCG/ACT inhaler, Inhale 2 puffs into the lungs 2 (two) times daily. Rinse mouth after use, Disp: 1 each, Rfl: 2   doxycycline (VIBRA-TABS) 100 MG tablet, Take 1 tablet (100 mg total) by mouth 2 (two) times daily. With food, Disp: 20 tablet, Rfl: 0   erythromycin ophthalmic ointment, Place 1 application into the left eye 3 (three) times daily. 4-7 days, Disp: 3.5 g, Rfl: 0   folic acid (FOLVITE) 1 MG tablet, Take 1 tablet (1 mg total) by mouth daily., Disp: 30 tablet, Rfl: 3   guaiFENesin (MUCINEX) 600 MG 12 hr tablet, Take 1 tablet (600 mg total) by mouth 2 (two) times daily. (Patient not taking: Reported on 05/07/2021), Disp: 30 tablet, Rfl: 0   hydrOXYzine (ATARAX/VISTARIL) 25 MG tablet, TAKE 1 TABLET BY  MOUTH DAILY AS NEEDED., Disp: 90 tablet, Rfl: 2   ipratropium-albuterol (DUONEB) 0.5-2.5 (3) MG/3ML SOLN, Inhale 3 mLs into the lungs every 6 (six) hours as needed., Disp: 360 mL, Rfl: 11   LORazepam (ATIVAN) 0.5 MG tablet, TAKE 1 TABLET (0.5 MG TOTAL) BY MOUTH DAILY AS NEEDED FOR ANXIETY OR SLEEP., Disp: 30 tablet, Rfl: 2   losartan (COZAAR) 50 MG tablet, Take 1 tablet (50 mg total) by mouth daily., Disp: 90 tablet, Rfl: 3   Multiple Vitamin (MULTI-VITAMIN) tablet, Take by mouth., Disp: , Rfl:    pantoprazole (PROTONIX) 20 MG tablet, Take 1 tablet (20 mg total) by mouth daily. 30 minutes before food, Disp: 90 tablet, Rfl: 3   potassium chloride SA (KLOR-CON M) 20 MEQ tablet, Take 1 tablet (20 mEq total) by mouth daily., Disp: 90 tablet, Rfl: 1   pravastatin (PRAVACHOL) 40 MG tablet, Take 1 tablet (40 mg total) by mouth at bedtime., Disp: 90 tablet, Rfl: 3   sertraline (ZOLOFT) 50 MG tablet, Take 1 tablet (50 mg total) by mouth daily at 12 noon. Note tablet changed from 1/2 of 100 mg to 50 mg qd, Disp: 90 tablet, Rfl: 3   vitamin B-12 (CYANOCOBALAMIN) 1000 MCG tablet, Take 1,000 mcg by mouth daily. ,  Disp: , Rfl:   Social History   Tobacco Use  Smoking Status Every Day   Packs/day: 1.00   Years: 26.00   Total pack years: 26.00   Types: Cigarettes  Smokeless Tobacco Never  Tobacco Comments   little less than one ppd    No Known Allergies Objective:  There were no vitals filed for this visit. There is no height or weight on file to calculate BMI. Constitutional Well developed. Well nourished.  Vascular Dorsalis pedis pulses palpable bilaterally. Posterior tibial pulses palpable bilaterally. Capillary refill normal to all digits.  No cyanosis or clubbing noted. Pedal hair growth normal.  Neurologic Normal speech. Oriented to person, place, and time. Epicritic sensation to light touch grossly present bilaterally.  Dermatologic Nails well groomed and normal in appearance. No open  wounds. No skin lesions.  Orthopedic: Normal joint ROM without pain or crepitus bilaterally. No visible deformities. Tender to palpation at the calcaneal tuber bilaterally. No pain with calcaneal squeeze bilaterally. Ankle ROM diminished range of motion bilaterally. Silfverskiold Test: positive bilaterally.   Radiographs: Taken and reviewed. No acute fractures or dislocations. No evidence of stress fracture.  Plantar heel spur absent. Posterior heel spur present.   Assessment:   1. Pain in both feet    Plan:  Patient was evaluated and treated and all questions answered.  Plantar Fasciitis, bilaterally - XR reviewed as above.  - Educated on icing and stretching. Instructions given.  - Injection delivered to the plantar fascia as below. - DME: Plantar fascial brace dispensed to support the medial longitudinal arch of the foot and offload pressure from the heel and prevent arch collapse during weightbearing - Pharmacologic management: None  Pes planovalgus -I explained to patient the etiology of pes planovalgus and relationship with Planter fasciitis and various treatment options were discussed.  Given patient foot structure in the setting of Planter fasciitis I believe patient will benefit from custom-made orthotics to help control the hindfoot motion support the arch of the foot and take the stress away from plantar fascial.  Patient agrees with the plan like to proceed with orthotics -Patient was casted for orthotics   Procedure: Injection Tendon/Ligament Location: Bilateral plantar fascia at the glabrous junction; medial approach. Skin Prep: alcohol Injectate: 0.5 cc 0.5% marcaine plain, 0.5 cc of 1% Lidocaine, 0.5 cc kenalog 10. Disposition: Patient tolerated procedure well. Injection site dressed with a band-aid.  No follow-ups on file.

## 2021-10-26 ENCOUNTER — Encounter: Payer: Self-pay | Admitting: Internal Medicine

## 2021-10-26 DIAGNOSIS — M25561 Pain in right knee: Secondary | ICD-10-CM | POA: Insufficient documentation

## 2021-10-26 DIAGNOSIS — M5416 Radiculopathy, lumbar region: Secondary | ICD-10-CM | POA: Insufficient documentation

## 2021-11-11 ENCOUNTER — Emergency Department: Payer: BC Managed Care – PPO

## 2021-11-11 ENCOUNTER — Inpatient Hospital Stay
Admission: EM | Admit: 2021-11-11 | Discharge: 2021-11-15 | DRG: 896 | Disposition: A | Discharge status: Home / Self Care | Payer: BC Managed Care – PPO | Attending: Internal Medicine | Admitting: Internal Medicine

## 2021-11-11 ENCOUNTER — Ambulatory Visit: Payer: BC Managed Care – PPO | Admitting: Podiatry

## 2021-11-11 ENCOUNTER — Other Ambulatory Visit: Payer: Self-pay

## 2021-11-11 DIAGNOSIS — Y906 Blood alcohol level of 120-199 mg/100 ml: Secondary | ICD-10-CM | POA: Diagnosis present

## 2021-11-11 DIAGNOSIS — D6959 Other secondary thrombocytopenia: Secondary | ICD-10-CM | POA: Diagnosis present

## 2021-11-11 DIAGNOSIS — F1010 Alcohol abuse, uncomplicated: Secondary | ICD-10-CM | POA: Diagnosis present

## 2021-11-11 DIAGNOSIS — F32A Depression, unspecified: Secondary | ICD-10-CM | POA: Diagnosis present

## 2021-11-11 DIAGNOSIS — Z20822 Contact with and (suspected) exposure to covid-19: Secondary | ICD-10-CM | POA: Diagnosis present

## 2021-11-11 DIAGNOSIS — E876 Hypokalemia: Secondary | ICD-10-CM | POA: Diagnosis present

## 2021-11-11 DIAGNOSIS — E785 Hyperlipidemia, unspecified: Secondary | ICD-10-CM | POA: Diagnosis present

## 2021-11-11 DIAGNOSIS — J449 Chronic obstructive pulmonary disease, unspecified: Secondary | ICD-10-CM | POA: Diagnosis present

## 2021-11-11 DIAGNOSIS — K529 Noninfective gastroenteritis and colitis, unspecified: Secondary | ICD-10-CM | POA: Diagnosis present

## 2021-11-11 DIAGNOSIS — R197 Diarrhea, unspecified: Secondary | ICD-10-CM | POA: Diagnosis not present

## 2021-11-11 DIAGNOSIS — F17210 Nicotine dependence, cigarettes, uncomplicated: Secondary | ICD-10-CM | POA: Diagnosis present

## 2021-11-11 DIAGNOSIS — Z8616 Personal history of COVID-19: Secondary | ICD-10-CM | POA: Diagnosis not present

## 2021-11-11 DIAGNOSIS — Z79890 Hormone replacement therapy (postmenopausal): Secondary | ICD-10-CM | POA: Diagnosis not present

## 2021-11-11 DIAGNOSIS — F10231 Alcohol dependence with withdrawal delirium: Secondary | ICD-10-CM | POA: Diagnosis present

## 2021-11-11 DIAGNOSIS — Z9049 Acquired absence of other specified parts of digestive tract: Secondary | ICD-10-CM

## 2021-11-11 DIAGNOSIS — G928 Other toxic encephalopathy: Secondary | ICD-10-CM | POA: Diagnosis present

## 2021-11-11 DIAGNOSIS — I10 Essential (primary) hypertension: Secondary | ICD-10-CM | POA: Diagnosis present

## 2021-11-11 DIAGNOSIS — J302 Other seasonal allergic rhinitis: Secondary | ICD-10-CM | POA: Diagnosis present

## 2021-11-11 DIAGNOSIS — K219 Gastro-esophageal reflux disease without esophagitis: Secondary | ICD-10-CM | POA: Diagnosis present

## 2021-11-11 DIAGNOSIS — R079 Chest pain, unspecified: Secondary | ICD-10-CM | POA: Diagnosis present

## 2021-11-11 DIAGNOSIS — R0789 Other chest pain: Secondary | ICD-10-CM | POA: Diagnosis present

## 2021-11-11 DIAGNOSIS — F419 Anxiety disorder, unspecified: Secondary | ICD-10-CM | POA: Diagnosis present

## 2021-11-11 DIAGNOSIS — I2510 Atherosclerotic heart disease of native coronary artery without angina pectoris: Secondary | ICD-10-CM | POA: Diagnosis present

## 2021-11-11 DIAGNOSIS — Z7982 Long term (current) use of aspirin: Secondary | ICD-10-CM | POA: Diagnosis not present

## 2021-11-11 DIAGNOSIS — Z79899 Other long term (current) drug therapy: Secondary | ICD-10-CM | POA: Diagnosis not present

## 2021-11-11 DIAGNOSIS — D696 Thrombocytopenia, unspecified: Secondary | ICD-10-CM | POA: Diagnosis not present

## 2021-11-11 LAB — CBC
HCT: 44.2 % (ref 39.0–52.0)
Hemoglobin: 15.3 g/dL (ref 13.0–17.0)
MCH: 34.3 pg — ABNORMAL HIGH (ref 26.0–34.0)
MCHC: 34.6 g/dL (ref 30.0–36.0)
MCV: 99.1 fL (ref 80.0–100.0)
Platelets: 104 10*3/uL — ABNORMAL LOW (ref 150–400)
RBC: 4.46 MIL/uL (ref 4.22–5.81)
RDW: 13.7 % (ref 11.5–15.5)
WBC: 8.2 10*3/uL (ref 4.0–10.5)
nRBC: 0 % (ref 0.0–0.2)

## 2021-11-11 LAB — BASIC METABOLIC PANEL
Anion gap: 19 — ABNORMAL HIGH (ref 5–15)
BUN: 18 mg/dL (ref 6–20)
CO2: 24 mmol/L (ref 22–32)
Calcium: 8.9 mg/dL (ref 8.9–10.3)
Chloride: 99 mmol/L (ref 98–111)
Creatinine, Ser: 0.85 mg/dL (ref 0.61–1.24)
GFR, Estimated: >60 mL/min (ref >60)
Glucose, Bld: 120 mg/dL — ABNORMAL HIGH (ref 70–99)
Potassium: 2.5 mmol/L — CL (ref 3.5–5.1)
Sodium: 142 mmol/L (ref 135–145)

## 2021-11-11 LAB — HEPATIC FUNCTION PANEL
ALT: 68 U/L — ABNORMAL HIGH (ref 0–44)
AST: 162 U/L — ABNORMAL HIGH (ref 15–41)
Albumin: 4.5 g/dL (ref 3.5–5.0)
Alkaline Phosphatase: 103 U/L (ref 38–126)
Bilirubin, Direct: 1 mg/dL — ABNORMAL HIGH (ref 0.0–0.2)
Indirect Bilirubin: 2 mg/dL — ABNORMAL HIGH (ref 0.3–0.9)
Total Bilirubin: 3 mg/dL — ABNORMAL HIGH (ref 0.3–1.2)
Total Protein: 7.5 g/dL (ref 6.5–8.1)

## 2021-11-11 LAB — TSH: TSH: 0.82 u[IU]/mL (ref 0.350–4.500)

## 2021-11-11 LAB — ETHANOL: Alcohol, Ethyl (B): 187 mg/dL — ABNORMAL HIGH (ref <10)

## 2021-11-11 LAB — LIPASE, BLOOD: Lipase: 37 U/L (ref 11–51)

## 2021-11-11 LAB — TROPONIN I (HIGH SENSITIVITY)
Troponin I (High Sensitivity): 10 ng/L (ref <18)
Troponin I (High Sensitivity): 14 ng/L (ref <18)

## 2021-11-11 LAB — SARS CORONAVIRUS 2 BY RT PCR: SARS Coronavirus 2 by RT PCR: NEGATIVE

## 2021-11-11 LAB — D-DIMER, QUANTITATIVE: D-Dimer, Quant: 2.79 ug/mL-FEU — ABNORMAL HIGH (ref 0.00–0.50)

## 2021-11-11 MED ORDER — FOLIC ACID 1 MG PO TABS
1.0000 mg | ORAL_TABLET | Freq: Every day | ORAL | Status: DC
Start: 2021-11-12 — End: 2021-11-16
  Administered 2021-11-13 – 2021-11-15 (×3): 1 mg via ORAL
  Filled 2021-11-11 (×3): qty 1

## 2021-11-11 MED ORDER — METRONIDAZOLE 500 MG/100ML IV SOLN
500.0000 mg | Freq: Once | INTRAVENOUS | Status: AC
  Administered 2021-11-11: 500 mg via INTRAVENOUS
  Filled 2021-11-11: qty 100

## 2021-11-11 MED ORDER — ALPRAZOLAM 0.5 MG PO TABS: 0.2500 mg | ORAL_TABLET | Freq: Two times a day (BID) | ORAL | Status: DC | PRN

## 2021-11-11 MED ORDER — PANTOPRAZOLE SODIUM 20 MG PO TBEC
20.0000 mg | DELAYED_RELEASE_TABLET | Freq: Every day | ORAL | Status: DC
  Administered 2021-11-13 – 2021-11-15 (×3): 20 mg via ORAL
  Filled 2021-11-11 (×4): qty 1

## 2021-11-11 MED ORDER — ONDANSETRON HCL 4 MG/2ML IJ SOLN
4.0000 mg | Freq: Four times a day (QID) | INTRAMUSCULAR | Status: DC | PRN
  Administered 2021-11-11 – 2021-11-13 (×2): 4 mg via INTRAVENOUS
  Filled 2021-11-11 (×2): qty 2

## 2021-11-11 MED ORDER — ADULT MULTIVITAMIN W/MINERALS CH
1.0000 | ORAL_TABLET | Freq: Every day | ORAL | Status: DC
Start: 2021-11-12 — End: 2021-11-16
  Administered 2021-11-13 – 2021-11-15 (×3): 1 via ORAL
  Filled 2021-11-11 (×3): qty 1

## 2021-11-11 MED ORDER — BACID PO TABS: 2.0000 | ORAL_TABLET | Freq: Three times a day (TID) | ORAL | Status: DC

## 2021-11-11 MED ORDER — SODIUM CHLORIDE 0.9% FLUSH
3.0000 mL | Freq: Two times a day (BID) | INTRAVENOUS | Status: DC
Start: 2021-11-11 — End: 2021-11-16
  Administered 2021-11-12 – 2021-11-15 (×4): 3 mL via INTRAVENOUS

## 2021-11-11 MED ORDER — CLONIDINE HCL 0.1 MG PO TABS
0.1000 mg | ORAL_TABLET | Freq: Three times a day (TID) | ORAL | Status: DC | PRN
Start: 2021-11-11 — End: 2021-11-16

## 2021-11-11 MED ORDER — ASPIRIN 81 MG PO CHEW: 81.0000 mg | CHEWABLE_TABLET | Freq: Every day | ORAL | Status: DC

## 2021-11-11 MED ORDER — METRONIDAZOLE 500 MG/100ML IV SOLN
500.0000 mg | Freq: Two times a day (BID) | INTRAVENOUS | Status: DC
  Administered 2021-11-12: 500 mg via INTRAVENOUS
  Filled 2021-11-11: qty 100

## 2021-11-11 MED ORDER — THIAMINE HCL 100 MG PO TABS
100.0000 mg | ORAL_TABLET | Freq: Every day | ORAL | Status: DC
  Administered 2021-11-13 – 2021-11-15 (×3): 100 mg via ORAL
  Filled 2021-11-11 (×3): qty 1

## 2021-11-11 MED ORDER — HYDROCODONE-ACETAMINOPHEN 5-325 MG PO TABS: 1.0000 | ORAL_TABLET | ORAL | Status: DC | PRN

## 2021-11-11 MED ORDER — METRONIDAZOLE 500 MG/100ML IV SOLN: 500.0000 mg | Freq: Two times a day (BID) | INTRAVENOUS | Status: DC

## 2021-11-11 MED ORDER — AMLODIPINE BESYLATE 5 MG PO TABS
2.5000 mg | ORAL_TABLET | Freq: Every day | ORAL | Status: DC
  Administered 2021-11-13 – 2021-11-15 (×3): 2.5 mg via ORAL
  Filled 2021-11-11 (×3): qty 1

## 2021-11-11 MED ORDER — LORAZEPAM 2 MG/ML IJ SOLN
0.5000 mg | Freq: Once | INTRAMUSCULAR | Status: AC
Start: 2021-11-11 — End: 2021-11-11
  Administered 2021-11-11: 0.5 mg via INTRAVENOUS
  Filled 2021-11-11: qty 1

## 2021-11-11 MED ORDER — IOHEXOL 350 MG/ML SOLN
100.0000 mL | Freq: Once | INTRAVENOUS | Status: AC | PRN
  Administered 2021-11-11: 100 mL via INTRAVENOUS

## 2021-11-11 MED ORDER — ACETAMINOPHEN 650 MG RE SUPP: 650.0000 mg | Freq: Four times a day (QID) | RECTAL | Status: DC | PRN

## 2021-11-11 MED ORDER — SODIUM CHLORIDE 0.9 % IV SOLN
1.0000 g | INTRAVENOUS | Status: DC
  Filled 2021-11-11: qty 10

## 2021-11-11 MED ORDER — PRAVASTATIN SODIUM 40 MG PO TABS
40.0000 mg | ORAL_TABLET | Freq: Every day | ORAL | Status: DC
  Administered 2021-11-11 – 2021-11-14 (×4): 40 mg via ORAL
  Filled 2021-11-11 (×3): qty 1
  Filled 2021-11-11: qty 2

## 2021-11-11 MED ORDER — BISACODYL 5 MG PO TBEC: 5.0000 mg | DELAYED_RELEASE_TABLET | Freq: Every day | ORAL | Status: DC | PRN

## 2021-11-11 MED ORDER — DILTIAZEM HCL-DEXTROSE 125-5 MG/125ML-% IV SOLN (PREMIX): 5.0000 mg/h | INTRAVENOUS | Status: DC

## 2021-11-11 MED ORDER — SODIUM CHLORIDE 0.9 % IV SOLN
1.0000 g | Freq: Once | INTRAVENOUS | Status: AC
  Administered 2021-11-11: 1 g via INTRAVENOUS
  Filled 2021-11-11: qty 10

## 2021-11-11 MED ORDER — NITROGLYCERIN 0.4 MG SL SUBL: 0.4000 mg | SUBLINGUAL_TABLET | SUBLINGUAL | Status: DC | PRN

## 2021-11-11 MED ORDER — POTASSIUM CHLORIDE 10 MEQ/100ML IV SOLN
10.0000 meq | Freq: Once | INTRAVENOUS | Status: AC
  Administered 2021-11-11: 10 meq via INTRAVENOUS
  Filled 2021-11-11: qty 100

## 2021-11-11 MED ORDER — SODIUM CHLORIDE 0.9 % IV BOLUS
500.0000 mL | Freq: Once | INTRAVENOUS | Status: AC
  Administered 2021-11-11: 500 mL via INTRAVENOUS

## 2021-11-11 MED ORDER — LORAZEPAM 2 MG/ML IJ SOLN
1.0000 mg | INTRAMUSCULAR | Status: DC | PRN
  Administered 2021-11-12 (×4): 2 mg via INTRAVENOUS
  Filled 2021-11-11 (×4): qty 1

## 2021-11-11 MED ORDER — SODIUM CHLORIDE 0.9 % IV SOLN
12.5000 mg | Freq: Four times a day (QID) | INTRAVENOUS | Status: DC | PRN
  Administered 2021-11-12: 12.5 mg via INTRAVENOUS
  Filled 2021-11-11: qty 0.5

## 2021-11-11 MED ORDER — RISAQUAD PO CAPS
1.0000 | ORAL_CAPSULE | Freq: Three times a day (TID) | ORAL | Status: DC
  Administered 2021-11-13 – 2021-11-15 (×5): 1 via ORAL
  Filled 2021-11-11 (×5): qty 1

## 2021-11-11 MED ORDER — HYDROCODONE-ACETAMINOPHEN 5-325 MG PO TABS
1.0000 | ORAL_TABLET | Freq: Once | ORAL | Status: DC
  Filled 2021-11-11: qty 1

## 2021-11-11 MED ORDER — SENNOSIDES-DOCUSATE SODIUM 8.6-50 MG PO TABS: 1.0000 | ORAL_TABLET | Freq: Every evening | ORAL | Status: DC | PRN

## 2021-11-11 MED ORDER — ONDANSETRON HCL 4 MG PO TABS: 4.0000 mg | ORAL_TABLET | Freq: Four times a day (QID) | ORAL | Status: DC | PRN

## 2021-11-11 MED ORDER — LORAZEPAM 0.5 MG PO TABS
0.5000 mg | ORAL_TABLET | Freq: Once | ORAL | Status: AC
  Administered 2021-11-11: 0.5 mg via ORAL
  Filled 2021-11-11: qty 1

## 2021-11-11 MED ORDER — MAGNESIUM SULFATE 2 GM/50ML IV SOLN
2.0000 g | Freq: Once | INTRAVENOUS | Status: AC
  Administered 2021-11-11: 2 g via INTRAVENOUS
  Filled 2021-11-11: qty 50

## 2021-11-11 MED ORDER — FLUTICASONE FUROATE-VILANTEROL 200-25 MCG/ACT IN AEPB
1.0000 | INHALATION_SPRAY | Freq: Every day | RESPIRATORY_TRACT | Status: DC
  Administered 2021-11-13 – 2021-11-15 (×3): 1 via RESPIRATORY_TRACT

## 2021-11-11 MED ORDER — ONDANSETRON HCL 4 MG/2ML IJ SOLN
4.0000 mg | Freq: Once | INTRAMUSCULAR | Status: AC
  Administered 2021-11-11: 4 mg via INTRAVENOUS
  Filled 2021-11-11: qty 2

## 2021-11-11 MED ORDER — SODIUM CHLORIDE 0.9 % IV SOLN: 1.0000 g | INTRAVENOUS | Status: DC

## 2021-11-11 MED ORDER — ZOLPIDEM TARTRATE 5 MG PO TABS
5.0000 mg | ORAL_TABLET | Freq: Every evening | ORAL | Status: DC | PRN
  Administered 2021-11-11: 5 mg via ORAL
  Filled 2021-11-11: qty 1

## 2021-11-11 MED ORDER — POTASSIUM CHLORIDE CRYS ER 20 MEQ PO TBCR
40.0000 meq | EXTENDED_RELEASE_TABLET | Freq: Once | ORAL | Status: AC
Start: 2021-11-11 — End: 2021-11-11
  Administered 2021-11-11: 40 meq via ORAL
  Filled 2021-11-11: qty 2

## 2021-11-11 MED ORDER — ACETAMINOPHEN 325 MG PO TABS: 650.0000 mg | ORAL_TABLET | Freq: Four times a day (QID) | ORAL | Status: DC | PRN

## 2021-11-11 MED ORDER — IPRATROPIUM-ALBUTEROL 0.5-2.5 (3) MG/3ML IN SOLN
3.0000 mL | Freq: Once | RESPIRATORY_TRACT | Status: AC
  Administered 2021-11-11: 3 mL via RESPIRATORY_TRACT
  Filled 2021-11-11: qty 3

## 2021-11-11 MED ORDER — MORPHINE SULFATE (PF) 2 MG/ML IV SOLN: 1.0000 mg | Freq: Four times a day (QID) | INTRAVENOUS | Status: DC | PRN

## 2021-11-11 MED ORDER — DILTIAZEM LOAD VIA INFUSION
15.0000 mg | Freq: Once | INTRAVENOUS | Status: DC
  Filled 2021-11-11: qty 15

## 2021-11-11 MED ORDER — SERTRALINE HCL 50 MG PO TABS
50.0000 mg | ORAL_TABLET | Freq: Every day | ORAL | Status: DC
  Administered 2021-11-13 – 2021-11-15 (×3): 50 mg via ORAL
  Filled 2021-11-11: qty 1

## 2021-11-11 MED ORDER — VITAMIN B-12 1000 MCG PO TABS
1000.0000 ug | ORAL_TABLET | Freq: Every day | ORAL | Status: DC
  Administered 2021-11-13 – 2021-11-15 (×3): 1000 ug via ORAL
  Filled 2021-11-11 (×4): qty 1

## 2021-11-11 MED ORDER — IPRATROPIUM-ALBUTEROL 0.5-2.5 (3) MG/3ML IN SOLN: 3.0000 mL | Freq: Four times a day (QID) | RESPIRATORY_TRACT | Status: DC | PRN

## 2021-11-11 MED ORDER — CYCLOBENZAPRINE HCL 10 MG PO TABS
5.0000 mg | ORAL_TABLET | Freq: Three times a day (TID) | ORAL | Status: DC | PRN
Start: 2021-11-11 — End: 2021-11-16

## 2021-11-11 MED ORDER — SODIUM CHLORIDE 0.9 % IV SOLN
INTRAVENOUS | Status: DC
Start: 2021-11-11 — End: 2021-11-16

## 2021-11-11 MED ORDER — ASPIRIN 81 MG PO TBEC
81.0000 mg | DELAYED_RELEASE_TABLET | Freq: Every day | ORAL | Status: DC
  Administered 2021-11-13 – 2021-11-15 (×3): 81 mg via ORAL
  Filled 2021-11-11 (×3): qty 1

## 2021-11-11 NOTE — ED Notes (Signed)
Pt BIB [facility] from home with reports of CP that started yesterday. HR 140 en route. 324 asa, 1000 NS, and '4mg'$  zofran. 116 Hr after fluids.

## 2021-11-11 NOTE — ED Triage Notes (Signed)
Pt came by [facility]. Pt complains of chest pain that started around 10 am. Pt states the pain goes into his face. Pt also complains of nausea, vomiting and shob. Pt has had no appetite for the last 4 days.

## 2021-11-11 NOTE — ED Notes (Signed)
Patient vomited up the Ambien I recently administered

## 2021-11-11 NOTE — ED Provider Notes (Signed)
[facility] Provider Note    Event Date/Time   First MD Initiated Contact with Patient 11/11/21 1800     (approximate)   History   Chest Pain   HPI  Darrell Santiago is a 56 y.o. male who presents to the ER for evaluation of nausea vomiting chest pain shortness of breath.  Generalized malaise over the past 24 to 48 hours very poor p.o. intake.  Has history of COPD asthma as well as alcohol abuse.  Has not had any diarrhea.  Is having some abdominal pain.   Physical Exam   Triage Vital Signs: ED Triage Vitals  Enc Vitals Group     BP 11/11/21 1738 110/78     Pulse Rate 11/11/21 1738 (!) 115     Resp 11/11/21 1738 17     Temp 11/11/21 1738 98.4 F (36.9 C)     Temp Source 11/11/21 1738 Oral     SpO2 11/11/21 1738 93 %     Weight 11/11/21 1739 190 lb (86.2 kg)     Height 11/11/21 1739 '5\' 9"'$  (1.753 m)     Head Circumference --      Peak Flow --      Pain Score 11/11/21 1739 5     Pain Loc --      Pain Edu? --      Excl. in [place]? --     Most recent vital signs: Vitals:   11/11/21 1830 11/11/21 2000  BP: 128/84 128/78  Pulse: (!) 103 (!) 109  Resp: 11 (!) 22  Temp:    SpO2: 92% 95%     Constitutional: Alert  Eyes: Conjunctivae are normal.  Head: Atraumatic. Nose: No congestion/rhinnorhea. Mouth/Throat: Mucous membranes are moist.   Neck: Painless ROM.  Cardiovascular:   Good peripheral circulation. Respiratory: Normal respiratory effort.  No retractions.  Gastrointestinal: Soft and nontender.  Musculoskeletal:  no deformity,  2+ ble edema Neurologic:  MAE spontaneously. No gross focal neurologic deficits are appreciated.  Skin:  Skin is warm, dry and intact. No rash noted. Psychiatric: Mood and affect are normal. Speech and behavior are normal.    ED Results / Procedures / Treatments   Labs (all labs ordered are listed, but only abnormal results are displayed) Labs Reviewed  BASIC METABOLIC PANEL - Abnormal; Notable for the  following components:      Result Value   Potassium 2.5 (*)    Glucose, Bld 120 (*)    Anion gap 19 (*)    All other components within normal limits  CBC - Abnormal; Notable for the following components:   MCH 34.3 (*)    Platelets 104 (*)    All other components within normal limits  ETHANOL - Abnormal; Notable for the following components:   Alcohol, Ethyl (B) 187 (*)    All other components within normal limits  D-DIMER, QUANTITATIVE - Abnormal; Notable for the following components:   D-Dimer, Quant 2.79 (*)    All other components within normal limits  HEPATIC FUNCTION PANEL - Abnormal; Notable for the following components:   AST 162 (*)    ALT 68 (*)    Total Bilirubin 3.0 (*)    Bilirubin, Direct 1.0 (*)    Indirect Bilirubin 2.0 (*)    All other components within normal limits  SARS CORONAVIRUS 2 BY RT PCR  LIPASE, BLOOD  TROPONIN I (HIGH SENSITIVITY)  TROPONIN I (HIGH SENSITIVITY)     EKG  ED ECG REPORT I,  Merlyn Lot, the attending physician, personally viewed and interpreted this ECG.   Date: 11/11/2021  EKG Time: 17:38  Rate: 120  Rhythm:  sinus  Axis: normal  Intervals: rbbb  ST&T Change: no stemi, nonspecific st abn    RADIOLOGY Please see ED Course for my review and interpretation.  I personally reviewed all radiographic images ordered to evaluate for the above acute complaints and reviewed radiology reports and findings.  These findings were personally discussed with the patient.  Please see medical record for radiology report.    PROCEDURES:  Critical Care performed: Yes, see critical care procedure note(s)  .Critical Care  Performed by: Merlyn Lot, MD Authorized by: Merlyn Lot, MD   Critical care provider statement:    Critical care time (minutes):  35   Critical care was necessary to treat or prevent imminent or life-threatening deterioration of the following conditions:  Metabolic crisis   Critical care was time  spent personally by me on the following activities:  Ordering and performing treatments and interventions, ordering and review of laboratory studies, ordering and review of radiographic studies, pulse oximetry, re-evaluation of patient's condition, review of old charts, obtaining history from patient or surrogate, examination of patient, evaluation of patient's response to treatment, discussions with primary provider, discussions with consultants and development of treatment plan with patient or surrogate    MEDICATIONS ORDERED IN ED: Medications  cefTRIAXone (ROCEPHIN) 1 g in sodium chloride 0.9 % 100 mL IVPB (1 g Intravenous New Bag/Given 11/11/21 2132)  metroNIDAZOLE (FLAGYL) IVPB 500 mg (has no administration in time range)  potassium chloride SA (KLOR-CON M) CR tablet 40 mEq (40 mEq Oral Given 11/11/21 1909)  potassium chloride 10 mEq in 100 mL IVPB (10 mEq Intravenous New Bag/Given 11/11/21 1917)  magnesium sulfate IVPB 2 g 50 mL (2 g Intravenous New Bag/Given 11/11/21 1917)  sodium chloride 0.9 % bolus 500 mL (0 mLs Intravenous Stopped 11/11/21 2034)  ipratropium-albuterol (DUONEB) 0.5-2.5 (3) MG/3ML nebulizer solution 3 mL (3 mLs Nebulization Given 11/11/21 1913)  ondansetron (ZOFRAN) injection 4 mg (4 mg Intravenous Given 11/11/21 1911)  LORazepam (ATIVAN) tablet 0.5 mg (0.5 mg Oral Given 11/11/21 1910)  iohexol (OMNIPAQUE) 350 MG/ML injection 100 mL (100 mLs Intravenous Contrast Given 11/11/21 2033)  LORazepam (ATIVAN) injection 0.5 mg (0.5 mg Intravenous Given 11/11/21 2119)     IMPRESSION / MDM / [place] / ED COURSE  I reviewed the triage vital signs and the nursing notes.                              Differential diagnosis includes, but is not limited to, neuritis, Tritus, COPD, asthma, PE, ACS, dissection, COVID, pneumonia, aspiration, withdrawal  Patient presented to the ER for evaluation of symptoms as described above.  This presenting complaint could reflect a  potentially life-threatening illness therefore the patient will be placed on continuous pulse oximetry and telemetry for monitoring.  Laboratory evaluation will be sent to evaluate for the above complaints.      Clinical Course as of 11/11/21 2144  Thu Nov 11, 2021  1831 Chest x-ray on my review and interpretation does not show any evidence of pneumothorax or consolidation [PR]  2006 D-dimer is elevated.  Patient is EtOH positive.  Given his chest pain and tachycardia elevated D-dimer will order CT imaging to evaluate for the but differential. [PR]  2049 CT imaging on my review and interpretation does not show any evidence  of pulmonary embolism. [PR]  2105 CT imaging of the abdomen shows inflammatory versus infectious colitis.  Patient does admit to chills but not having diarrhea.  Is having nausea and vomiting.  Will cover with antibiotics given his tachycardia and ill appearance.  Repeat exam without any wheezing.  No hypoxia.  Will keep on CIWA protocol.  Will consult hospitalist for admission. [PR]    Clinical Course User Index [PR] Merlyn Lot, MD     FINAL CLINICAL IMPRESSION(S) / ED DIAGNOSES   Final diagnoses:  Atypical chest pain  Hypokalemia  Colitis     Rx / DC Orders   ED Discharge Orders     None        Note:  This document was prepared using Dragon voice recognition software and may include unintentional dictation errors.    Merlyn Lot, MD 11/11/21 2144

## 2021-11-11 NOTE — H&P (Signed)
History and Physical   [facility] [place] [facility] Admission History and Physical [employer], D.O.    Patient Name: Darrell Santiago MR#: [id number] Date of Birth: 1965-10-09 Date of Admission: 11/11/2021  Referring MD/NP/PA: Dr. Quentin Cornwall Primary Care Physician: McLean-Scocuzza, Nino Glow, MD  Chief Complaint:  Chief Complaint  Patient presents with   Chest Pain    HPI: Darrell Santiago is a 56 y.o. male with a known history of hypertension, hyperlipidemia, COPD, asthma alcohol use disorder presents to the emergency department for evaluation of chest pain.  Patient was in a usual state of health until this morning when he developed central chest pain described as 7 out of 10, central radiating into his face associated with nausea vomiting and shortness of breath.  He also reports normal stool first thing in the morning that becomes progressively loose over the day..  Stated generalized weakness   Otherwise there has been no change in status. Patient has been taking medication as prescribed and there has been no recent change in medication or diet.  No recent antibiotics.  There has been no recent illness, hospitalizations, travel or sick contacts.    EMS/ED Course: Patient received Ativan, Zofran, magnesium, potassium, normal saline, Rocephin, Flagyl. Medical admission has been requested for further management of colitis, chest pain.  Review of Systems:  CONSTITUTIONAL: No fever/chills, fatigue, weakness, weight gain/loss, headache. EYES: No blurry or double vision. ENT: No tinnitus, postnasal drip, redness or soreness of the oropharynx. RESPIRATORY: No cough, dyspnea, wheeze.  No hemoptysis.  CARDIOVASCULAR: Positive chest pain, negative palpitations, syncope, orthopnea. No lower extremity edema.  GASTROINTESTINAL: Positive nausea, vomiting, abdominal pain, diarrhea.  No hematemesis, melena or hematochezia. GENITOURINARY: No dysuria, frequency, hematuria. ENDOCRINE: No  polyuria or nocturia. No heat or cold intolerance. HEMATOLOGY: No anemia, bruising, bleeding. INTEGUMENTARY: No rashes, ulcers, lesions. MUSCULOSKELETAL: No arthritis, gout,  NEUROLOGIC: No numbness, tingling, ataxia, seizure-type activity, weakness. PSYCHIATRIC: No anxiety, depression, insomnia.   Past Medical History:  Diagnosis Date   Abnormal EKG    Allergy    Seasonal   Anxiety    Chicken pox    COVID-19    04/20/21   Depression    GERD (gastroesophageal reflux disease)    Hyperlipidemia    Hypertension    Urine incontinence     Past Surgical History:  Procedure Laterality Date   CHOLECYSTECTOMY  2000   LEFT HEART CATH AND CORONARY ANGIOGRAPHY N/A 01/31/2017   Procedure: LEFT HEART CATH AND CORONARY ANGIOGRAPHY;  Surgeon: Dionisio David, MD;  Location: [facility];  Service: Cardiovascular;  Laterality: N/A;     reports that he has been smoking cigarettes. He has a 26.00 pack-year smoking history. He has never used smokeless tobacco. He reports current alcohol use of about 21.0 standard drinks of alcohol per week. He reports that he does not use drugs.  No Known Allergies  Family History  Problem Relation Age of Onset   Alcohol abuse Father    Diabetes Father    Cancer Father        ?stage IV thyroid with met   Cancer Maternal Grandfather        Colon Cancer    Prior to Admission medications   Medication Sig Start Date End Date Taking? Authorizing Provider  amLODipine (NORVASC) 2.5 MG tablet Take 2.5 mg by mouth daily.   Yes [provider]  aspirin 81 MG chewable tablet Chew 81 mg by mouth daily.   Yes [provider]  budesonide-formoterol (SYMBICORT) 160-4.5 MCG/ACT inhaler Inhale 2 puffs into the lungs 2 (two) times daily. Rinse mouth after use 04/30/21  Yes Regalado, Belkys A, MD  cyclobenzaprine (FLEXERIL) 5 MG tablet Take 5 mg by mouth 3 (three) times daily as needed. 10/25/21  Yes [provider]  meloxicam (MOBIC) 15 MG  tablet Take 15 mg by mouth 3 (three) times daily. 10/30/21  Yes [provider]  Multiple Vitamin (MULTI-VITAMIN) tablet Take 1 tablet by mouth daily.   Yes [provider]  pravastatin (PRAVACHOL) 40 MG tablet Take 1 tablet (40 mg total) by mouth at bedtime. 02/16/21  Yes McLean-Scocuzza, Nino Glow, MD  sertraline (ZOLOFT) 50 MG tablet Take 1 tablet (50 mg total) by mouth daily at 12 noon. Note tablet changed from 1/2 of 100 mg to 50 mg qd 02/16/21 11/11/21 Yes McLean-Scocuzza, Nino Glow, MD  vitamin B-12 (CYANOCOBALAMIN) 1000 MCG tablet Take 1,000 mcg by mouth daily.    Yes [provider]  acetaminophen (TYLENOL) 325 MG tablet Take 2 tablets (650 mg total) by mouth every 6 (six) hours as needed for mild pain (or Fever >/= 101). 07/26/18   Gladstone Lighter, MD  doxycycline (VIBRA-TABS) 100 MG tablet Take 1 tablet (100 mg total) by mouth 2 (two) times daily. With food Patient not taking: Reported on 11/11/2021 05/07/21   McLean-Scocuzza, Nino Glow, MD  erythromycin ophthalmic ointment Place 1 application into the left eye 3 (three) times daily. 4-7 days Patient not taking: Reported on 11/11/2021 05/07/21   McLean-Scocuzza, Nino Glow, MD  folic acid (FOLVITE) 1 MG tablet Take 1 tablet (1 mg total) by mouth daily. Patient not taking: Reported on 11/11/2021 05/01/21   Regalado, Jerald Kief A, MD  guaiFENesin (MUCINEX) 600 MG 12 hr tablet Take 1 tablet (600 mg total) by mouth 2 (two) times daily. Patient not taking: Reported on 05/07/2021 04/30/21   Regalado, Jerald Kief A, MD  hydrOXYzine (ATARAX/VISTARIL) 25 MG tablet TAKE 1 TABLET BY MOUTH DAILY AS NEEDED. 03/10/21   McLean-Scocuzza, Nino Glow, MD  ipratropium-albuterol (DUONEB) 0.5-2.5 (3) MG/3ML SOLN Inhale 3 mLs into the lungs every 6 (six) hours as needed. 02/16/21   McLean-Scocuzza, Nino Glow, MD  LORazepam (ATIVAN) 0.5 MG tablet TAKE 1 TABLET (0.5 MG TOTAL) BY MOUTH DAILY AS NEEDED FOR ANXIETY OR SLEEP. 09/14/21   McLean-Scocuzza, Nino Glow, MD  losartan  (COZAAR) 50 MG tablet Take 1 tablet (50 mg total) by mouth daily. Patient not taking: Reported on 11/11/2021 02/16/21   McLean-Scocuzza, Nino Glow, MD  pantoprazole (PROTONIX) 20 MG tablet Take 1 tablet (20 mg total) by mouth daily. 30 minutes before food Patient not taking: Reported on 11/11/2021 02/16/21   McLean-Scocuzza, Nino Glow, MD  potassium chloride SA (KLOR-CON M) 20 MEQ tablet Take 1 tablet (20 mEq total) by mouth daily. Patient not taking: Reported on 11/11/2021 05/07/21   McLean-Scocuzza, Nino Glow, MD    Physical Exam: Vitals:   11/11/21 1738 11/11/21 1739 11/11/21 1830 11/11/21 2000  BP: 110/78  128/84 128/78  Pulse: (!) 115  (!) 103 (!) 109  Resp: 17  11 (!) 22  Temp: 98.4 F (36.9 C)     TempSrc: Oral     SpO2: 93%  92% 95%  Weight:  86.2 kg    Height:  $Remove'5\' 9"'uACzDWs$  (1.753 m)      GENERAL: 56 y.o.-year-old white male patient, well-developed, well-nourished lying in the bed in no acute distress.  Somnolent but arousable HEENT: Head atraumatic, normocephalic. Pupils equal. Mucus membranes dry  NECK: Supple. No JVD. CHEST: Normal breath sounds bilaterally. No wheezing, rales, rhonchi or crackles. No use of accessory muscles of respiration.  No reproducible chest wall tenderness.  CARDIOVASCULAR: S1, S2 normal. No murmurs, rubs, or gallops. Cap refill <2 seconds. Pulses intact distally.  ABDOMEN: Soft, nondistended, nontender. No rebound, guarding, rigidity. Normoactive bowel sounds present in all four quadrants.  EXTREMITIES: No pedal edema, cyanosis, or clubbing. No calf tenderness or Homan's sign.  NEUROLOGIC: The patient is alert and oriented x 3. Cranial nerves II through XII are grossly intact with no focal sensorimotor deficit. PSYCHIATRIC:  Normal affect, mood, thought content. SKIN: Warm, dry, and intact without obvious rash, lesion, or ulcer.    Labs on Admission:  CBC: Recent Labs  Lab 11/11/21 1746  WBC 8.2  HGB 15.3  HCT 44.2  MCV 99.1  PLT 939*   Basic Metabolic  Panel: Recent Labs  Lab 11/11/21 1746  NA 142  K 2.5*  CL 99  CO2 24  GLUCOSE 120*  BUN 18  CREATININE 0.85  CALCIUM 8.9   GFR: Estimated Creatinine Clearance: 108.1 mL/min (by C-G formula based on SCr of 0.85 mg/dL). Liver Function Tests: Recent Labs  Lab 11/11/21 1834  AST 162*  ALT 68*  ALKPHOS 103  BILITOT 3.0*  PROT 7.5  ALBUMIN 4.5   Recent Labs  Lab 11/11/21 1834  LIPASE 37   No results for input(s): "AMMONIA" in the last 168 hours. Coagulation Profile: No results for input(s): "INR", "PROTIME" in the last 168 hours. Cardiac Enzymes: No results for input(s): "CKTOTAL", "CKMB", "CKMBINDEX", "TROPONINI" in the last 168 hours. BNP (last 3 results) No results for input(s): "PROBNP" in the last 8760 hours. HbA1C: No results for input(s): "HGBA1C" in the last 72 hours. CBG: No results for input(s): "GLUCAP" in the last 168 hours. Lipid Profile: No results for input(s): "CHOL", "HDL", "LDLCALC", "TRIG", "CHOLHDL", "LDLDIRECT" in the last 72 hours. Thyroid Function Tests: No results for input(s): "TSH", "T4TOTAL", "FREET4", "T3FREE", "THYROIDAB" in the last 72 hours. Anemia Panel: No results for input(s): "VITAMINB12", "FOLATE", "FERRITIN", "TIBC", "IRON", "RETICCTPCT" in the last 72 hours. Urine analysis:    Component Value Date/Time   COLORURINE DARK YELLOW 08/27/2018 0908   APPEARANCEUR CLOUDY (A) 08/27/2018 0908   LABSPEC 1.022 08/27/2018 0908   PHURINE 6.5 08/27/2018 0908   GLUCOSEU CANCELED 05/22/2020 0812   GLUCOSEU NEGATIVE 06/05/2017 0852   HGBUR NEGATIVE 08/27/2018 0908   BILIRUBINUR NEGATIVE 07/23/2018 1826   KETONESUR NEGATIVE 08/27/2018 0908   PROTEINUR CANCELED 05/22/2020 0812   PROTEINUR TRACE (A) 08/27/2018 0908   UROBILINOGEN 1.0 06/05/2017 0852   NITRITE NEGATIVE 08/27/2018 0908   LEUKOCYTESUR TRACE (A) 08/27/2018 0908   Sepsis Labs: $RemoveBefo'@[id number]'qLFrTlSjOtK$ (procalcitonin:4,lacticidven:4) ) Recent Results (from the past 240 hour(s))  SARS  Coronavirus 2 by RT PCR (hospital order, performed in [place] hospital lab) *cepheid single result test* Anterior Nasal Swab     Status: None   Collection Time: 11/11/21  7:25 PM   Specimen: Anterior Nasal Swab  Result Value Ref Range Status   SARS Coronavirus 2 by RT PCR NEGATIVE NEGATIVE Final    Comment: (NOTE) SARS-CoV-2 target nucleic acids are NOT DETECTED.  The SARS-CoV-2 RNA is generally detectable in upper and lower respiratory specimens during the acute phase of infection. The lowest concentration of SARS-CoV-2 viral copies this assay can detect is 250 copies / mL. A negative result does not preclude SARS-CoV-2 infection and should not be used as the sole basis for treatment or  other patient management decisions.  A negative result may occur with improper specimen collection / handling, submission of specimen other than nasopharyngeal swab, presence of viral mutation(s) within the areas targeted by this assay, and inadequate number of viral copies (<250 copies / mL). A negative result must be combined with clinical observations, patient history, and epidemiological information.  Fact Sheet for Patients:   https://www.patel.info/  Fact Sheet for Healthcare Providers: https://hall.com/  This test is not yet approved or  cleared by the [place] FDA and has been authorized for detection and/or diagnosis of SARS-CoV-2 by FDA under an Emergency Use Authorization (EUA).  This EUA will remain in effect (meaning this test can be used) for the duration of the COVID-19 declaration under Section 564(b)(1) of the Act, 21 U.S.C. section 360bbb-3(b)(1), unless the authorization is terminated or revoked sooner.  Performed at [facility], [address] [place], [place] [address]      Radiological Exams on Admission: CT ABDOMEN PELVIS W CONTRAST  Result Date: 11/11/2021 CLINICAL DATA:  Chest pain with nausea, vomiting  and loss of appetite. EXAM: CT ABDOMEN AND PELVIS WITH CONTRAST TECHNIQUE: Multidetector CT imaging of the abdomen and pelvis was performed using the standard protocol following bolus administration of intravenous contrast. RADIATION DOSE REDUCTION: This exam was performed according to the departmental dose-optimization program which includes automated exposure control, adjustment of the mA and/or kV according to patient size and/or use of iterative reconstruction technique. CONTRAST:  162mL OMNIPAQUE IOHEXOL 350 MG/ML SOLN COMPARISON:  April 26, 2021 FINDINGS: Lower chest: No acute abnormality. Hepatobiliary: There is diffuse fatty infiltration of the liver parenchyma. No focal liver abnormality is seen. Status post cholecystectomy. No biliary dilatation. Pancreas: Unremarkable. No pancreatic ductal dilatation or surrounding inflammatory changes. Spleen: No splenic injury or perisplenic hematoma. Adrenals/Urinary Tract: The right adrenal gland is unremarkable. A 1.8 cm diameter low-attenuation (approximately -17.19 Hounsfield units) left adrenal mass is noted. Kidneys are normal in size, without renal calculi or hydronephrosis. A 1.5 cm simple cyst is seen within the mid left kidney. No additional follow-up or imaging is recommended. The urinary bladder is poorly distended and subsequently limited in evaluation. Stomach/Bowel: Stomach is within normal limits. Appendix appears normal. No evidence of bowel dilatation. Moderate to marked severity diffuse colonic wall thickening is seen. Numerous diverticula are noted throughout the sigmoid colon. Vascular/Lymphatic: Aortic atherosclerosis. No enlarged abdominal or pelvic lymph nodes. Reproductive: Prostate is unremarkable. Other: No abdominal wall hernia or abnormality. No abdominopelvic ascites. Musculoskeletal: No acute or significant osseous findings. IMPRESSION: 1. Moderate to marked severity diffuse infectious or inflammatory colitis. 2. Sigmoid diverticulosis.  3. Hepatic steatosis. 4. Evidence of prior cholecystectomy. 5. Left adrenal mass consistent with a lipid-rich benign adenoma. No follow-up imaging is recommended. JACR 2017 Aug; 14(8):1038-44, JCAT 2016 Mar-Apr; 40(2):194-200, Urol J 2006 Spring; 3(2):71-4. Aortic Atherosclerosis (ICD10-I70.0). Electronically Signed   By: Virgina Norfolk M.D.   On: 11/11/2021 20:54   CT Angio Chest PE W and/or Wo Contrast  Result Date: 11/11/2021 CLINICAL DATA:  Chest pain. EXAM: CT ANGIOGRAPHY CHEST WITH CONTRAST TECHNIQUE: Multidetector CT imaging of the chest was performed using the standard protocol during bolus administration of intravenous contrast. Multiplanar CT image reconstructions and MIPs were obtained to evaluate the vascular anatomy. RADIATION DOSE REDUCTION: This exam was performed according to the departmental dose-optimization program which includes automated exposure control, adjustment of the mA and/or kV according to patient size and/or use of iterative reconstruction technique. CONTRAST:  156mL OMNIPAQUE IOHEXOL 350 MG/ML SOLN  COMPARISON:  April 26, 2021 FINDINGS: Cardiovascular: There is mild calcification of the aortic arch, without evidence of aortic aneurysm. Satisfactory opacification of the pulmonary arteries to the segmental level. No evidence of pulmonary embolism. Normal heart size with mild coronary artery calcification. No pericardial effusion. Mediastinum/Nodes: No enlarged mediastinal, hilar, or axillary lymph nodes. Thyroid gland, trachea, and esophagus demonstrate no significant findings. Lungs/Pleura: Very mild, hazy atelectasis is seen along the posterior aspect of the right lower lobe. A stable thin walled parenchymal cyst is seen within the posterior aspect of the left lung base. There is no evidence of acute infiltrate, pleural effusion or pneumothorax. Upper Abdomen: There is diffuse fatty infiltration of the liver parenchyma. Multiple surgical clips are seen within the gallbladder  fossa. There is a small hiatal hernia. Musculoskeletal: No chest wall abnormality. No acute or significant osseous findings. Review of the MIP images confirms the above findings. IMPRESSION: 1. No evidence of pulmonary embolism or other acute intrathoracic process. 2. Mild coronary artery calcification. 3. Small hiatal hernia. 4. Hepatic steatosis. 5. Evidence of prior cholecystectomy. Aortic Atherosclerosis (ICD10-I70.0). Electronically Signed   By: Virgina Norfolk M.D.   On: 11/11/2021 20:47   DG Chest 2 View  Result Date: 11/11/2021 CLINICAL DATA:  Chest pain EXAM: CHEST - 2 VIEW COMPARISON:  04/26/2021 FINDINGS: Cardiac and mediastinal contours are within normal limits given slightly lower lung volumes. No focal pulmonary opacity. No pleural effusion or pneumothorax. No acute osseous abnormality. IMPRESSION: No acute cardiopulmonary process. Electronically Signed   By: Merilyn Baba M.D.   On: 11/11/2021 18:47    EKG: Sinus tachycardia at 120 bpm with normal axis and nonspecific ST-T wave changes.   Assessment/Plan  This is a 56 y.o. male with a history of hypertension, hyperlipidemia, COPD, asthma alcohol use disorder now being admitted with:  #. Chest pain, rule out ACS - Admit to observation with telemetry monitoring. - Trend troponins, check lipids and TSH. - Morphine, nitro, aspirin and statin ordered.   - Check echo -Cardiology consultation in AM  #.  Colitis, likely infectious - IV Rocephin and Flagyl - Probiotics - Pain control - Check stool cultures  #. Moderate hypokalemia - Replace orally - Check mag and phos  #.  EtOH use disorder severe with possible withdrawal - CIWA protocol - Oral vitamins - Social work  #. History of hypertension - Continue Norvasc  #. History of hyperlipidemia - Continue pravastatin  #. History of COPD - Continue Symbicort  #. History of GERD - Continue Protonix  Admission status: Inpatient IV Fluids: Normal  saline Diet/Nutrition: Clear liquid Consults called: Cardiology DVT Px: SCDs and early ambulation. Code Status: Full Code  Disposition Plan: To home in 1-2 days  All the records are reviewed and case discussed with ED provider. Management plans discussed with the patient and/or family who express understanding and agree with plan of care.  Miyani Cronic D.O. on 11/11/2021 at 10:17 PM CC: Primary care physician; McLean-Scocuzza, Nino Glow, MD   11/11/2021, 10:17 PM

## 2021-11-11 NOTE — ED Notes (Addendum)
Pt to ED for SOB and central CP that started around 9am this morning. Pt took his inhaler before arriving, with no relief.  Denies fever or chills. Has cough.  Pt also chronic alcohol user states he took a "couple swigs" of liquor this AM. States he usually drinks everyday, unable to state how much per day, states "depends on the day".   Pt is anxious upon assessment.

## 2021-11-12 DIAGNOSIS — F10231 Alcohol dependence with withdrawal delirium: Secondary | ICD-10-CM | POA: Diagnosis not present

## 2021-11-12 LAB — CLOSTRIDIUM DIFFICILE BY PCR, REFLEXED: Toxigenic C. Difficile by PCR: NEGATIVE

## 2021-11-12 LAB — CBC
HCT: 39.1 % (ref 39.0–52.0)
Hemoglobin: 13.7 g/dL (ref 13.0–17.0)
MCH: 35 pg — ABNORMAL HIGH (ref 26.0–34.0)
MCHC: 35 g/dL (ref 30.0–36.0)
MCV: 100 fL (ref 80.0–100.0)
Platelets: 80 10*3/uL — ABNORMAL LOW (ref 150–400)
RBC: 3.91 MIL/uL — ABNORMAL LOW (ref 4.22–5.81)
RDW: 14 % (ref 11.5–15.5)
WBC: 8.5 10*3/uL (ref 4.0–10.5)
nRBC: 0 % (ref 0.0–0.2)

## 2021-11-12 LAB — COMPREHENSIVE METABOLIC PANEL
ALT: 54 U/L — ABNORMAL HIGH (ref 0–44)
AST: 106 U/L — ABNORMAL HIGH (ref 15–41)
Albumin: 3.8 g/dL (ref 3.5–5.0)
Alkaline Phosphatase: 79 U/L (ref 38–126)
Anion gap: 15 (ref 5–15)
BUN: 16 mg/dL (ref 6–20)
CO2: 27 mmol/L (ref 22–32)
Calcium: 8.5 mg/dL — ABNORMAL LOW (ref 8.9–10.3)
Chloride: 102 mmol/L (ref 98–111)
Creatinine, Ser: 0.68 mg/dL (ref 0.61–1.24)
GFR, Estimated: >60 mL/min (ref >60)
Glucose, Bld: 129 mg/dL — ABNORMAL HIGH (ref 70–99)
Potassium: 3.1 mmol/L — ABNORMAL LOW (ref 3.5–5.1)
Sodium: 144 mmol/L (ref 135–145)
Total Bilirubin: 2.5 mg/dL — ABNORMAL HIGH (ref 0.3–1.2)
Total Protein: 6.4 g/dL — ABNORMAL LOW (ref 6.5–8.1)

## 2021-11-12 LAB — TROPONIN I (HIGH SENSITIVITY)
Troponin I (High Sensitivity): 12 ng/L (ref <18)
Troponin I (High Sensitivity): 12 ng/L (ref <18)

## 2021-11-12 LAB — C DIFFICILE QUICK SCREEN W PCR REFLEX
C Diff antigen: POSITIVE — AB
C Diff toxin: NEGATIVE

## 2021-11-12 LAB — LIPID PANEL
Cholesterol: 192 mg/dL (ref 0–200)
HDL: 99 mg/dL (ref >40)
LDL Cholesterol: 84 mg/dL (ref 0–99)
Total CHOL/HDL Ratio: 1.9 RATIO
Triglycerides: 44 mg/dL (ref <150)
VLDL: 9 mg/dL (ref 0–40)

## 2021-11-12 LAB — GLUCOSE, CAPILLARY: Glucose-Capillary: 101 mg/dL — ABNORMAL HIGH (ref 70–99)

## 2021-11-12 MED ORDER — SODIUM CHLORIDE 0.9 % IV SOLN
6.2500 mg | Freq: Four times a day (QID) | INTRAVENOUS | Status: DC | PRN
  Filled 2021-11-12 (×2): qty 0.25

## 2021-11-12 MED ORDER — POTASSIUM CHLORIDE CRYS ER 20 MEQ PO TBCR
40.0000 meq | EXTENDED_RELEASE_TABLET | Freq: Two times a day (BID) | ORAL | Status: DC
  Administered 2021-11-12: 40 meq via ORAL
  Filled 2021-11-12: qty 2

## 2021-11-12 NOTE — ED Notes (Signed)
Dr. Avon Gully at bedside

## 2021-11-12 NOTE — ED Notes (Signed)
The pt was able to stand and pivot with assistance to the bedside commode. The pt was very unsteady on his feet. The pt was initially urged to use a urinal or bed pan but he refused. The pt had a dark runny stool. The pt was assisted with standing and cleaned up. A brief was placed on the pt and his bed was cleaned. The pt was placed back in the bed without incident. Call bell and wife at bedside.

## 2021-11-12 NOTE — Progress Notes (Addendum)
PROGRESS NOTE    Darrell Santiago  GUY:[id number] DOB: 11-Jun-1965 DOA: 11/11/2021 PCP: McLean-Scocuzza, Nino Glow, MD   Brief Narrative:  Darrell Santiago is a 56 y.o. male with a known history of hypertension, hyperlipidemia, COPD, asthma alcohol use disorder presents to the emergency department for evaluation of chest pain.  Assessment & Plan:   Principal Problem:   Alcohol withdrawal delirium, acute, mixed level of activity (HCC) Active Problems:   Anxiety and depression   CAD (coronary artery disease)   Alcohol abuse   Chest pain, rule out acute myocardial infarction   Acute alcohol withdrawal Acute metabolic/toxic encephalopathy, improving -Drinks upwards of 1 750 mL bottle of liquor per day -has been out of work due to leg injury, unclear if patient had been drinking more than usual, wife at work states she does not know his daily routine -Patient's last drink was 11/11/2021 and is already having withdrawal symptoms concerning for high risk -Continue CIWA protocol, consider Librium taper in the next 24 to 48 hours pending patient's symptoms.  Currently well controlled on current regimen if not somewhat somnolent this morning after initial dose of benzodiazepines -Continue multivitamin, supplementation per protocol  Intractable nausea and vomiting, epigastric pain secondary to above Concurrent diarrhea -Unlikely infectious given the above -Discontinue antibiotics, patient remains without white count or fever -Stool culture pending  -ACS ruled out, troponin negative EKG without overt findings other than right bundle branch block  Moderate hypokalemia Secondary to diarrhea   Hypertension, essential - Continue Norvasc   Hyperlipidemia - Continue pravastatin   COPD without acute exacerbation - Continue Symbicort   GERD, controlled - Continue Protonix  DVT prophylaxis: SCDs, early ambulation Code Status: Full Family Communication: Wife at bedside  Status is:  Inpatient  Dispo: The patient is from: Home              Anticipated d/c is to: To be determined              Anticipated d/c date is: 48 to 72 hours              Patient currently not medically stable for discharge  Consultants:  None  Procedures:  None  Antimicrobials:  Discontinued 11/12/2021  Subjective: No acute issues or events overnight, patient somewhat somnolent this morning but arousable secondary to recent benzodiazepine administration.  Review of systems limited but denies overt chest pain nausea headache fevers or chills.  Objective: Vitals:   11/12/21 1530 11/12/21 1545 11/12/21 1600 11/12/21 1625  BP: 120/73   124/66  Pulse: 67 (!) 106 (!) 121 60  Resp: (!) 30 (!) 26 (!) 23 18  Temp:    99.3 F (37.4 C)  TempSrc:    Oral  SpO2:   94% 97%  Weight:      Height:        Intake/Output Summary (Last 24 hours) at 11/12/2021 1754 Last data filed at 11/12/2021 1512 Gross per 24 hour  Intake 100 ml  Output --  Net 100 ml   Filed Weights   11/11/21 1739  Weight: 86.2 kg    Examination:  General exam: Appears calm and comfortable  Respiratory system: Clear to auscultation. Respiratory effort normal. Cardiovascular system: S1 & S2 heard, RRR. No JVD, murmurs, rubs, gallops or clicks. No pedal edema. Gastrointestinal system: Abdomen is nondistended, soft and nontender. No organomegaly or masses felt. Normal bowel sounds heard. Central nervous system: Alert and oriented. No focal neurological deficits. Extremities: Symmetric 5 x 5 power. Skin:  No rashes, lesions or ulcers Psychiatry: Judgement and insight appear normal. Mood & affect appropriate.     Data Reviewed: I have personally reviewed following labs and imaging studies  CBC: Recent Labs  Lab 11/11/21 1746 11/12/21 0507  WBC 8.2 8.5  HGB 15.3 13.7  HCT 44.2 39.1  MCV 99.1 100.0  PLT 104* 80*   Basic Metabolic Panel: Recent Labs  Lab 11/11/21 1746 11/12/21 0507  NA 142 144  K 2.5* 3.1*   CL 99 102  CO2 24 27  GLUCOSE 120* 129*  BUN 18 16  CREATININE 0.85 0.68  CALCIUM 8.9 8.5*   GFR: Estimated Creatinine Clearance: 114.8 mL/min (by C-G formula based on SCr of 0.68 mg/dL). Liver Function Tests: Recent Labs  Lab 11/11/21 1834 11/12/21 0507  AST 162* 106*  ALT 68* 54*  ALKPHOS 103 79  BILITOT 3.0* 2.5*  PROT 7.5 6.4*  ALBUMIN 4.5 3.8   Recent Labs  Lab 11/11/21 1834  LIPASE 37   No results for input(s): "AMMONIA" in the last 168 hours. Coagulation Profile: No results for input(s): "INR", "PROTIME" in the last 168 hours. Cardiac Enzymes: No results for input(s): "CKTOTAL", "CKMB", "CKMBINDEX", "TROPONINI" in the last 168 hours. BNP (last 3 results) No results for input(s): "PROBNP" in the last 8760 hours. HbA1C: No results for input(s): "HGBA1C" in the last 72 hours. CBG: No results for input(s): "GLUCAP" in the last 168 hours. Lipid Profile: Recent Labs    11/12/21 0507  CHOL 192  HDL 99  LDLCALC 84  TRIG 44  CHOLHDL 1.9   Thyroid Function Tests: Recent Labs    11/11/21 2259  TSH 0.820   Anemia Panel: No results for input(s): "VITAMINB12", "FOLATE", "FERRITIN", "TIBC", "IRON", "RETICCTPCT" in the last 72 hours. Sepsis Labs: No results for input(s): "PROCALCITON", "LATICACIDVEN" in the last 168 hours.  Recent Results (from the past 240 hour(s))  SARS Coronavirus 2 by RT PCR (hospital order, performed in [facility] hospital lab) *cepheid single result test* Anterior Nasal Swab     Status: None   Collection Time: 11/11/21  7:25 PM   Specimen: Anterior Nasal Swab  Result Value Ref Range Status   SARS Coronavirus 2 by RT PCR NEGATIVE NEGATIVE Final    Comment: (NOTE) SARS-CoV-2 target nucleic acids are NOT DETECTED.  The SARS-CoV-2 RNA is generally detectable in upper and lower respiratory specimens during the acute phase of infection. The lowest concentration of SARS-CoV-2 viral copies this assay can detect is 250 copies / mL. A  negative result does not preclude SARS-CoV-2 infection and should not be used as the sole basis for treatment or other patient management decisions.  A negative result may occur with improper specimen collection / handling, submission of specimen other than nasopharyngeal swab, presence of viral mutation(s) within the areas targeted by this assay, and inadequate number of viral copies (<250 copies / mL). A negative result must be combined with clinical observations, patient history, and epidemiological information.  Fact Sheet for Patients:   https://www.patel.info/  Fact Sheet for Healthcare Providers: https://hall.com/  This test is not yet approved or  cleared by the [place] FDA and has been authorized for detection and/or diagnosis of SARS-CoV-2 by FDA under an Emergency Use Authorization (EUA).  This EUA will remain in effect (meaning this test can be used) for the duration of the COVID-19 declaration under Section 564(b)(1) of the Act, 21 U.S.C. section 360bbb-3(b)(1), unless the authorization is terminated or revoked sooner.  Performed at [facility]  Lab, [place], [place] [address]   C Difficile Quick Screen w PCR reflex     Status: Abnormal   Collection Time: 11/12/21  8:30 AM   Specimen: Stool  Result Value Ref Range Status   C Diff antigen POSITIVE (A) NEGATIVE Final   C Diff toxin NEGATIVE NEGATIVE Final   C Diff interpretation Results are indeterminate. See PCR results.  Final    Comment: Performed at [facility], [address] [place], [place] [address]  C. Diff by PCR, Reflexed     Status: None   Collection Time: 11/12/21  8:30 AM  Result Value Ref Range Status   Toxigenic C. Difficile by PCR NEGATIVE NEGATIVE Final    Comment: Patient is colonized with non toxigenic C. difficile. May not need treatment unless significant symptoms are present. Performed at [facility],  [address] [place],  [address]          Radiology Studies: CT ABDOMEN PELVIS W CONTRAST  Result Date: 11/11/2021 CLINICAL DATA:  Chest pain with nausea, vomiting and loss of appetite. EXAM: CT ABDOMEN AND PELVIS WITH CONTRAST TECHNIQUE: Multidetector CT imaging of the abdomen and pelvis was performed using the standard protocol following bolus administration of intravenous contrast. RADIATION DOSE REDUCTION: This exam was performed according to the departmental dose-optimization program which includes automated exposure control, adjustment of the mA and/or kV according to patient size and/or use of iterative reconstruction technique. CONTRAST:  161m OMNIPAQUE IOHEXOL 350 MG/ML SOLN COMPARISON:  April 26, 2021 FINDINGS: Lower chest: No acute abnormality. Hepatobiliary: There is diffuse fatty infiltration of the liver parenchyma. No focal liver abnormality is seen. Status post cholecystectomy. No biliary dilatation. Pancreas: Unremarkable. No pancreatic ductal dilatation or surrounding inflammatory changes. Spleen: No splenic injury or perisplenic hematoma. Adrenals/Urinary Tract: The right adrenal gland is unremarkable. A 1.8 cm diameter low-attenuation (approximately -17.19 Hounsfield units) left adrenal mass is noted. Kidneys are normal in size, without renal calculi or hydronephrosis. A 1.5 cm simple cyst is seen within the mid left kidney. No additional follow-up or imaging is recommended. The urinary bladder is poorly distended and subsequently limited in evaluation. Stomach/Bowel: Stomach is within normal limits. Appendix appears normal. No evidence of bowel dilatation. Moderate to marked severity diffuse colonic wall thickening is seen. Numerous diverticula are noted throughout the sigmoid colon. Vascular/Lymphatic: Aortic atherosclerosis. No enlarged abdominal or pelvic lymph nodes. Reproductive: Prostate is unremarkable. Other: No abdominal wall hernia or abnormality. No  abdominopelvic ascites. Musculoskeletal: No acute or significant osseous findings. IMPRESSION: 1. Moderate to marked severity diffuse infectious or inflammatory colitis. 2. Sigmoid diverticulosis. 3. Hepatic steatosis. 4. Evidence of prior cholecystectomy. 5. Left adrenal mass consistent with a lipid-rich benign adenoma. No follow-up imaging is recommended. JACR 2017 Aug; 14(8):1038-44, JCAT 2016 Mar-Apr; 40(2):194-200, Urol J 2006 Spring; 3(2):71-4. Aortic Atherosclerosis (ICD10-I70.0). Electronically Signed   By: TVirgina NorfolkM.D.   On: 11/11/2021 20:54   CT Angio Chest PE W and/or Wo Contrast  Result Date: 11/11/2021 CLINICAL DATA:  Chest pain. EXAM: CT ANGIOGRAPHY CHEST WITH CONTRAST TECHNIQUE: Multidetector CT imaging of the chest was performed using the standard protocol during bolus administration of intravenous contrast. Multiplanar CT image reconstructions and MIPs were obtained to evaluate the vascular anatomy. RADIATION DOSE REDUCTION: This exam was performed according to the departmental dose-optimization program which includes automated exposure control, adjustment of the mA and/or kV according to patient size and/or use of iterative reconstruction technique. CONTRAST:  1067mOMNIPAQUE IOHEXOL 350 MG/ML  SOLN COMPARISON:  April 26, 2021 FINDINGS: Cardiovascular: There is mild calcification of the aortic arch, without evidence of aortic aneurysm. Satisfactory opacification of the pulmonary arteries to the segmental level. No evidence of pulmonary embolism. Normal heart size with mild coronary artery calcification. No pericardial effusion. Mediastinum/Nodes: No enlarged mediastinal, hilar, or axillary lymph nodes. Thyroid gland, trachea, and esophagus demonstrate no significant findings. Lungs/Pleura: Very mild, hazy atelectasis is seen along the posterior aspect of the right lower lobe. A stable thin walled parenchymal cyst is seen within the posterior aspect of the left lung base. There is no  evidence of acute infiltrate, pleural effusion or pneumothorax. Upper Abdomen: There is diffuse fatty infiltration of the liver parenchyma. Multiple surgical clips are seen within the gallbladder fossa. There is a small hiatal hernia. Musculoskeletal: No chest wall abnormality. No acute or significant osseous findings. Review of the MIP images confirms the above findings. IMPRESSION: 1. No evidence of pulmonary embolism or other acute intrathoracic process. 2. Mild coronary artery calcification. 3. Small hiatal hernia. 4. Hepatic steatosis. 5. Evidence of prior cholecystectomy. Aortic Atherosclerosis (ICD10-I70.0). Electronically Signed   By: Virgina Norfolk M.D.   On: 11/11/2021 20:47   DG Chest 2 View  Result Date: 11/11/2021 CLINICAL DATA:  Chest pain EXAM: CHEST - 2 VIEW COMPARISON:  04/26/2021 FINDINGS: Cardiac and mediastinal contours are within normal limits given slightly lower lung volumes. No focal pulmonary opacity. No pleural effusion or pneumothorax. No acute osseous abnormality. IMPRESSION: No acute cardiopulmonary process. Electronically Signed   By: Merilyn Baba M.D.   On: 11/11/2021 18:47        Scheduled Meds:  acidophilus  1 capsule Oral TID with meals   amLODipine  2.5 mg Oral Daily   aspirin EC  81 mg Oral Daily   cyanocobalamin  1,000 mcg Oral Daily   fluticasone furoate-vilanterol  1 puff Inhalation Daily   folic acid  1 mg Oral Daily   multivitamin with minerals  1 tablet Oral Daily   pantoprazole  20 mg Oral Daily   potassium chloride  40 mEq Oral BID   pravastatin  40 mg Oral QHS   sertraline  50 mg Oral Q1200   sodium chloride flush  3 mL Intravenous Q12H   thiamine  100 mg Oral Daily   Continuous Infusions:  sodium chloride 100 mL/hr at 11/12/21 1054   promethazine (PHENERGAN) injection (IM or IVPB)       LOS: 1 day   Time spent: 47mn   Elsworth Ledin C Teighlor Korson, DO [facility]  If 7PM-7AM, please contact  night-coverage [email or web address]  11/12/2021, 5:54 PM

## 2021-11-12 NOTE — ED Notes (Signed)
This RN updated Dr. Avon Gully on pt's current condition including diarrhea, CIWA score and lack of energy. Will continue to monitor.

## 2021-11-12 NOTE — Plan of Care (Signed)
  Problem: Education: Goal: Understanding of cardiac disease, CV risk reduction, and recovery process will improve Outcome: Progressing Goal: Individualized Educational Video(s) Outcome: Progressing   Problem: Cardiac: Goal: Ability to achieve and maintain adequate cardiovascular perfusion will improve Outcome: Progressing   

## 2021-11-13 DIAGNOSIS — D696 Thrombocytopenia, unspecified: Secondary | ICD-10-CM

## 2021-11-13 DIAGNOSIS — R197 Diarrhea, unspecified: Secondary | ICD-10-CM | POA: Diagnosis not present

## 2021-11-13 DIAGNOSIS — F10231 Alcohol dependence with withdrawal delirium: Secondary | ICD-10-CM | POA: Diagnosis not present

## 2021-11-13 LAB — COMPREHENSIVE METABOLIC PANEL
ALT: 46 U/L — ABNORMAL HIGH (ref 0–44)
AST: 81 U/L — ABNORMAL HIGH (ref 15–41)
Albumin: 4 g/dL (ref 3.5–5.0)
Alkaline Phosphatase: 90 U/L (ref 38–126)
Anion gap: 12 (ref 5–15)
BUN: 9 mg/dL (ref 6–20)
CO2: 25 mmol/L (ref 22–32)
Calcium: 9.1 mg/dL (ref 8.9–10.3)
Chloride: 103 mmol/L (ref 98–111)
Creatinine, Ser: 0.6 mg/dL — ABNORMAL LOW (ref 0.61–1.24)
GFR, Estimated: >60 mL/min (ref >60)
Glucose, Bld: 96 mg/dL (ref 70–99)
Potassium: 3.2 mmol/L — ABNORMAL LOW (ref 3.5–5.1)
Sodium: 140 mmol/L (ref 135–145)
Total Bilirubin: 2.4 mg/dL — ABNORMAL HIGH (ref 0.3–1.2)
Total Protein: 6.6 g/dL (ref 6.5–8.1)

## 2021-11-13 LAB — CBC
HCT: 42 % (ref 39.0–52.0)
Hemoglobin: 14.1 g/dL (ref 13.0–17.0)
MCH: 34.5 pg — ABNORMAL HIGH (ref 26.0–34.0)
MCHC: 33.6 g/dL (ref 30.0–36.0)
MCV: 102.7 fL — ABNORMAL HIGH (ref 80.0–100.0)
Platelets: 65 10*3/uL — ABNORMAL LOW (ref 150–400)
RBC: 4.09 MIL/uL — ABNORMAL LOW (ref 4.22–5.81)
RDW: 13.3 % (ref 11.5–15.5)
WBC: 8.7 10*3/uL (ref 4.0–10.5)
nRBC: 0 % (ref 0.0–0.2)

## 2021-11-13 MED ORDER — POTASSIUM CHLORIDE CRYS ER 20 MEQ PO TBCR
40.0000 meq | EXTENDED_RELEASE_TABLET | Freq: Once | ORAL | Status: AC
Start: 2021-11-13 — End: 2021-11-13
  Administered 2021-11-13: 40 meq via ORAL
  Filled 2021-11-13: qty 2

## 2021-11-13 MED ORDER — ALUM & MAG HYDROXIDE-SIMETH 200-200-20 MG/5ML PO SUSP
30.0000 mL | ORAL | Status: DC | PRN
  Administered 2021-11-13 – 2021-11-14 (×2): 30 mL via ORAL
  Filled 2021-11-13 (×2): qty 30

## 2021-11-13 MED ORDER — LORAZEPAM 1 MG PO TABS: 1.0000 mg | ORAL_TABLET | ORAL | Status: DC | PRN

## 2021-11-13 MED ORDER — LORAZEPAM 2 MG/ML IJ SOLN
1.0000 mg | INTRAMUSCULAR | Status: DC | PRN
  Administered 2021-11-13: 3 mg via INTRAVENOUS
  Administered 2021-11-13: 2 mg via INTRAVENOUS
  Administered 2021-11-13: 1 mg via INTRAVENOUS
  Administered 2021-11-13 (×3): 2 mg via INTRAVENOUS
  Administered 2021-11-14 – 2021-11-15 (×4): 1 mg via INTRAVENOUS
  Filled 2021-11-13: qty 1
  Filled 2021-11-13: qty 2
  Filled 2021-11-13 (×8): qty 1

## 2021-11-13 NOTE — Progress Notes (Signed)
PROGRESS NOTE    Darrell Santiago  TTS:[id number] DOB: [birth year] DOA: 11/11/2021 PCP: McLean-Scocuzza, Nino Glow, MD    Assessment & Plan:   Principal Problem:   Alcohol withdrawal delirium, acute, mixed level of activity ([place]) Active Problems:   Anxiety and depression   CAD (coronary artery disease)   Alcohol abuse   Chest pain, rule out acute myocardial infarction  Assessment and Plan:  Acute alcohol withdrawal: received alcohol cessation counseling. Continue on CIWA protocol.   Acute toxic encephalopathy: likely secondary to alcohol abuse. Improving    Intractable nausea and vomiting/epigastric pain & diarrhea: c. diff antigen is positive but toxin neg. Consider starting po vanco if diarrhea does not improve. Stool cx is pending. ACS r/o   Thrombocytopenia: likely secondary to alcohol abuse.    Hypokalemia: secondary to diarrhea. KCl repleated   HTN: continue on amlodipine    HLD: continue on statin   COPD: w/o exacerbation. Continue on bronchodilators    GERD: continue on PPI     DVT prophylaxis:  SCDs Code Status: full  Family Communication: discussed pt's care w/ pt's wife, Epifanio Lesches, at bedside and answered their questions  Disposition Plan: likely d/c back home   Level of care: Telemetry Cardiac  Status is: Inpatient Remains inpatient appropriate because: severity of illness  Consultants:    Procedures:  Antimicrobials:   Subjective: Pt is lethargic   Objective: Vitals:   11/13/21 0021 11/13/21 0409 11/13/21 0427 11/13/21 0730  BP: 127/81 122/78  114/78  Pulse: 85 69  72  Resp: 18   18  Temp: 98.9 F (37.2 C) 99.3 F (37.4 C)    TempSrc: Oral Oral    SpO2: 95% 96%  92%  Weight:   81.9 kg   Height:        Intake/Output Summary (Last 24 hours) at 11/13/2021 0803 Last data filed at 11/13/2021 0322 Gross per 24 hour  Intake 2237.44 ml  Output 0 ml  Net 2237.44 ml   Filed Weights   11/11/21 1739 11/13/21 0427  Weight: 86.2 kg 81.9 kg     Examination:  General exam: Appears lethargic  Respiratory system: Clear to auscultation. Respiratory effort normal. Cardiovascular system: S1 & S2+. No rubs, gallops or clicks.  Gastrointestinal system: Abdomen is nondistended, soft and nontender. Normal bowel sounds heard. Central nervous system: Lethargic. Moves all extremities  Psychiatry: Judgement and insight appears not at baseline. Flat mood and affect.     Data Reviewed: I have personally reviewed following labs and imaging studies  CBC: Recent Labs  Lab 11/11/21 1746 11/12/21 0507  WBC 8.2 8.5  HGB 15.3 13.7  HCT 44.2 39.1  MCV 99.1 100.0  PLT 104* 80*   Basic Metabolic Panel: Recent Labs  Lab 11/11/21 1746 11/12/21 0507 11/13/21 0704  NA 142 144 140  K 2.5* 3.1* 3.2*  CL 99 102 103  CO2 '24 27 25  '$ GLUCOSE 120* 129* 96  BUN '18 16 9  '$ CREATININE 0.85 0.68 0.60*  CALCIUM 8.9 8.5* 9.1   GFR: Estimated Creatinine Clearance: 105.6 mL/min (A) (by C-G formula based on SCr of 0.6 mg/dL (L)). Liver Function Tests: Recent Labs  Lab 11/11/21 1834 11/12/21 0507 11/13/21 0704  AST 162* 106* 81*  ALT 68* 54* 46*  ALKPHOS 103 79 90  BILITOT 3.0* 2.5* 2.4*  PROT 7.5 6.4* 6.6  ALBUMIN 4.5 3.8 4.0   Recent Labs  Lab 11/11/21 1834  LIPASE 37   No results for input(s): "AMMONIA" in the last  168 hours. Coagulation Profile: No results for input(s): "INR", "PROTIME" in the last 168 hours. Cardiac Enzymes: No results for input(s): "CKTOTAL", "CKMB", "CKMBINDEX", "TROPONINI" in the last 168 hours. BNP (last 3 results) No results for input(s): "PROBNP" in the last 8760 hours. HbA1C: No results for input(s): "HGBA1C" in the last 72 hours. CBG: Recent Labs  Lab 11/12/21 2128  GLUCAP 101*   Lipid Profile: Recent Labs    11/12/21 0507  CHOL 192  HDL 99  LDLCALC 84  TRIG 44  CHOLHDL 1.9   Thyroid Function Tests: Recent Labs    11/11/21 2259  TSH 0.820   Anemia Panel: No results for input(s):  "VITAMINB12", "FOLATE", "FERRITIN", "TIBC", "IRON", "RETICCTPCT" in the last 72 hours. Sepsis Labs: No results for input(s): "PROCALCITON", "LATICACIDVEN" in the last 168 hours.  Recent Results (from the past 240 hour(s))  SARS Coronavirus 2 by RT PCR (hospital order, performed in [facility] hospital lab) *cepheid single result test* Anterior Nasal Swab     Status: None   Collection Time: 11/11/21  7:25 PM   Specimen: Anterior Nasal Swab  Result Value Ref Range Status   SARS Coronavirus 2 by RT PCR NEGATIVE NEGATIVE Final    Comment: (NOTE) SARS-CoV-2 target nucleic acids are NOT DETECTED.  The SARS-CoV-2 RNA is generally detectable in upper and lower respiratory specimens during the acute phase of infection. The lowest concentration of SARS-CoV-2 viral copies this assay can detect is 250 copies / mL. A negative result does not preclude SARS-CoV-2 infection and should not be used as the sole basis for treatment or other patient management decisions.  A negative result may occur with improper specimen collection / handling, submission of specimen other than nasopharyngeal swab, presence of viral mutation(s) within the areas targeted by this assay, and inadequate number of viral copies (<250 copies / mL). A negative result must be combined with clinical observations, patient history, and epidemiological information.  Fact Sheet for Patients:   https://www.patel.info/  Fact Sheet for Healthcare Providers: https://hall.com/  This test is not yet approved or  cleared by the [place] FDA and has been authorized for detection and/or diagnosis of SARS-CoV-2 by FDA under an Emergency Use Authorization (EUA).  This EUA will remain in effect (meaning this test can be used) for the duration of the COVID-19 declaration under Section 564(b)(1) of the Act, 21 U.S.C. section 360bbb-3(b)(1), unless the authorization is terminated or revoked  sooner.  Performed at [facility], [place], [place] [address]   C Difficile Quick Screen w PCR reflex     Status: Abnormal   Collection Time: 11/12/21  8:30 AM   Specimen: Stool  Result Value Ref Range Status   C Diff antigen POSITIVE (A) NEGATIVE Final   C Diff toxin NEGATIVE NEGATIVE Final   C Diff interpretation Results are indeterminate. See PCR results.  Final    Comment: Performed at [facility], [address] [place], [place] [address]  C. Diff by PCR, Reflexed     Status: None   Collection Time: 11/12/21  8:30 AM  Result Value Ref Range Status   Toxigenic C. Difficile by PCR NEGATIVE NEGATIVE Final    Comment: Patient is colonized with non toxigenic C. difficile. May not need treatment unless significant symptoms are present. Performed at [facility], [address] [place], [place] [address]          Radiology Studies: CT ABDOMEN PELVIS W CONTRAST  Result Date: 11/11/2021 CLINICAL  DATA:  Chest pain with nausea, vomiting and loss of appetite. EXAM: CT ABDOMEN AND PELVIS WITH CONTRAST TECHNIQUE: Multidetector CT imaging of the abdomen and pelvis was performed using the standard protocol following bolus administration of intravenous contrast. RADIATION DOSE REDUCTION: This exam was performed according to the departmental dose-optimization program which includes automated exposure control, adjustment of the mA and/or kV according to patient size and/or use of iterative reconstruction technique. CONTRAST:  127m OMNIPAQUE IOHEXOL 350 MG/ML SOLN COMPARISON:  April 26, 2021 FINDINGS: Lower chest: No acute abnormality. Hepatobiliary: There is diffuse fatty infiltration of the liver parenchyma. No focal liver abnormality is seen. Status post cholecystectomy. No biliary dilatation. Pancreas: Unremarkable. No pancreatic ductal dilatation or surrounding inflammatory changes. Spleen: No splenic injury or perisplenic hematoma.  Adrenals/Urinary Tract: The right adrenal gland is unremarkable. A 1.8 cm diameter low-attenuation (approximately -17.19 Hounsfield units) left adrenal mass is noted. Kidneys are normal in size, without renal calculi or hydronephrosis. A 1.5 cm simple cyst is seen within the mid left kidney. No additional follow-up or imaging is recommended. The urinary bladder is poorly distended and subsequently limited in evaluation. Stomach/Bowel: Stomach is within normal limits. Appendix appears normal. No evidence of bowel dilatation. Moderate to marked severity diffuse colonic wall thickening is seen. Numerous diverticula are noted throughout the sigmoid colon. Vascular/Lymphatic: Aortic atherosclerosis. No enlarged abdominal or pelvic lymph nodes. Reproductive: Prostate is unremarkable. Other: No abdominal wall hernia or abnormality. No abdominopelvic ascites. Musculoskeletal: No acute or significant osseous findings. IMPRESSION: 1. Moderate to marked severity diffuse infectious or inflammatory colitis. 2. Sigmoid diverticulosis. 3. Hepatic steatosis. 4. Evidence of prior cholecystectomy. 5. Left adrenal mass consistent with a lipid-rich benign adenoma. No follow-up imaging is recommended. JACR 2017 Aug; 14(8):1038-44, JCAT 2016 Mar-Apr; 40(2):194-200, Urol J 2006 Spring; 3(2):71-4. Aortic Atherosclerosis (ICD10-I70.0). Electronically Signed   By: TVirgina NorfolkM.D.   On: 11/11/2021 20:54   CT Angio Chest PE W and/or Wo Contrast  Result Date: 11/11/2021 CLINICAL DATA:  Chest pain. EXAM: CT ANGIOGRAPHY CHEST WITH CONTRAST TECHNIQUE: Multidetector CT imaging of the chest was performed using the standard protocol during bolus administration of intravenous contrast. Multiplanar CT image reconstructions and MIPs were obtained to evaluate the vascular anatomy. RADIATION DOSE REDUCTION: This exam was performed according to the departmental dose-optimization program which includes automated exposure control, adjustment of  the mA and/or kV according to patient size and/or use of iterative reconstruction technique. CONTRAST:  1066mOMNIPAQUE IOHEXOL 350 MG/ML SOLN COMPARISON:  April 26, 2021 FINDINGS: Cardiovascular: There is mild calcification of the aortic arch, without evidence of aortic aneurysm. Satisfactory opacification of the pulmonary arteries to the segmental level. No evidence of pulmonary embolism. Normal heart size with mild coronary artery calcification. No pericardial effusion. Mediastinum/Nodes: No enlarged mediastinal, hilar, or axillary lymph nodes. Thyroid gland, trachea, and esophagus demonstrate no significant findings. Lungs/Pleura: Very mild, hazy atelectasis is seen along the posterior aspect of the right lower lobe. A stable thin walled parenchymal cyst is seen within the posterior aspect of the left lung base. There is no evidence of acute infiltrate, pleural effusion or pneumothorax. Upper Abdomen: There is diffuse fatty infiltration of the liver parenchyma. Multiple surgical clips are seen within the gallbladder fossa. There is a small hiatal hernia. Musculoskeletal: No chest wall abnormality. No acute or significant osseous findings. Review of the MIP images confirms the above findings. IMPRESSION: 1. No evidence of pulmonary embolism or other acute intrathoracic process. 2. Mild coronary artery calcification. 3. Small hiatal hernia. 4.  Hepatic steatosis. 5. Evidence of prior cholecystectomy. Aortic Atherosclerosis (ICD10-I70.0). Electronically Signed   By: Virgina Norfolk M.D.   On: 11/11/2021 20:47   DG Chest 2 View  Result Date: 11/11/2021 CLINICAL DATA:  Chest pain EXAM: CHEST - 2 VIEW COMPARISON:  04/26/2021 FINDINGS: Cardiac and mediastinal contours are within normal limits given slightly lower lung volumes. No focal pulmonary opacity. No pleural effusion or pneumothorax. No acute osseous abnormality. IMPRESSION: No acute cardiopulmonary process. Electronically Signed   By: Merilyn Baba M.D.    On: 11/11/2021 18:47        Scheduled Meds:  acidophilus  1 capsule Oral TID with meals   amLODipine  2.5 mg Oral Daily   aspirin EC  81 mg Oral Daily   cyanocobalamin  1,000 mcg Oral Daily   fluticasone furoate-vilanterol  1 puff Inhalation Daily   folic acid  1 mg Oral Daily   multivitamin with minerals  1 tablet Oral Daily   pantoprazole  20 mg Oral Daily   potassium chloride  40 mEq Oral BID   pravastatin  40 mg Oral QHS   sertraline  50 mg Oral Q1200   sodium chloride flush  3 mL Intravenous Q12H   thiamine  100 mg Oral Daily   Continuous Infusions:  sodium chloride 100 mL/hr at 11/13/21 0322   promethazine (PHENERGAN) injection (IM or IVPB)       LOS: 2 days    Time spent: 35 mins    Wyvonnia Dusky, MD [facility] Pager [phone]  If 7PM-7AM, please contact night-coverage [email or web address] 11/13/2021, 8:03 AM

## 2021-11-13 NOTE — Progress Notes (Signed)
[place] CIWA score 16 - appropriate CIWA protocol treatment orderset initiated

## 2021-11-14 DIAGNOSIS — D696 Thrombocytopenia, unspecified: Secondary | ICD-10-CM | POA: Diagnosis not present

## 2021-11-14 DIAGNOSIS — R197 Diarrhea, unspecified: Secondary | ICD-10-CM | POA: Diagnosis not present

## 2021-11-14 DIAGNOSIS — F10231 Alcohol dependence with withdrawal delirium: Secondary | ICD-10-CM | POA: Diagnosis not present

## 2021-11-14 LAB — CBC
HCT: 40.6 % (ref 39.0–52.0)
Hemoglobin: 13.9 g/dL (ref 13.0–17.0)
MCH: 34.8 pg — ABNORMAL HIGH (ref 26.0–34.0)
MCHC: 34.2 g/dL (ref 30.0–36.0)
MCV: 101.5 fL — ABNORMAL HIGH (ref 80.0–100.0)
Platelets: 74 10*3/uL — ABNORMAL LOW (ref 150–400)
RBC: 4 MIL/uL — ABNORMAL LOW (ref 4.22–5.81)
RDW: 12.9 % (ref 11.5–15.5)
WBC: 6.4 10*3/uL (ref 4.0–10.5)
nRBC: 0 % (ref 0.0–0.2)

## 2021-11-14 LAB — COMPREHENSIVE METABOLIC PANEL
ALT: 48 U/L — ABNORMAL HIGH (ref 0–44)
AST: 85 U/L — ABNORMAL HIGH (ref 15–41)
Albumin: 3.7 g/dL (ref 3.5–5.0)
Alkaline Phosphatase: 89 U/L (ref 38–126)
Anion gap: 10 (ref 5–15)
BUN: <5 mg/dL — ABNORMAL LOW (ref 6–20)
CO2: 27 mmol/L (ref 22–32)
Calcium: 8.7 mg/dL — ABNORMAL LOW (ref 8.9–10.3)
Chloride: 97 mmol/L — ABNORMAL LOW (ref 98–111)
Creatinine, Ser: 0.52 mg/dL — ABNORMAL LOW (ref 0.61–1.24)
GFR, Estimated: >60 mL/min (ref >60)
Glucose, Bld: 96 mg/dL (ref 70–99)
Potassium: 2.6 mmol/L — CL (ref 3.5–5.1)
Sodium: 134 mmol/L — ABNORMAL LOW (ref 135–145)
Total Bilirubin: 1.9 mg/dL — ABNORMAL HIGH (ref 0.3–1.2)
Total Protein: 6.4 g/dL — ABNORMAL LOW (ref 6.5–8.1)

## 2021-11-14 LAB — MAGNESIUM: Magnesium: 1.4 mg/dL — ABNORMAL LOW (ref 1.7–2.4)

## 2021-11-14 LAB — PHOSPHORUS: Phosphorus: <1 mg/dL — CL (ref 2.5–4.6)

## 2021-11-14 MED ORDER — POTASSIUM CHLORIDE CRYS ER 20 MEQ PO TBCR
40.0000 meq | EXTENDED_RELEASE_TABLET | Freq: Two times a day (BID) | ORAL | Status: AC
Start: 2021-11-14 — End: 2021-11-14
  Administered 2021-11-14 (×2): 40 meq via ORAL
  Filled 2021-11-14 (×2): qty 2

## 2021-11-14 MED ORDER — DEXTROSE 5 % IV SOLN
30.0000 mmol | Freq: Once | INTRAVENOUS | Status: AC
  Administered 2021-11-14: 30 mmol via INTRAVENOUS
  Filled 2021-11-14: qty 10

## 2021-11-14 MED ORDER — MAGNESIUM SULFATE 4 GM/100ML IV SOLN
4.0000 g | Freq: Once | INTRAVENOUS | Status: AC
Start: 2021-11-14 — End: 2021-11-14
  Administered 2021-11-14: 4 g via INTRAVENOUS
  Filled 2021-11-14: qty 100

## 2021-11-14 NOTE — TOC Initial Note (Signed)
Transition of Care [facility]) - Initial/Assessment Note    Patient Details  Name: RYOSUKE ERICKSEN MRN: [id number] Date of Birth: [birth year]  Transition of Care [facility]) CM/SW Contact:    Kerin Salen, RN Phone Number: 11/14/2021, 2:53 PM  Clinical Narrative:   Transition of Care [facility]) Screening Note   Patient Details  Name: ZAQUAN DUFFNER Date of Birth: Nov 23, 1965   Transition of Care [facility]) CM/SW Contact:    Kerin Salen, RN Phone Number: 11/14/2021, 2:53 PM    Transition of Care Department [facility]) has reviewed patient and no TOC needs have been identified at this time. We will continue to monitor patient advancement through interdisciplinary progression rounds. If new patient transition needs arise, please place a TOC consult.   Patient on contact precaution, CM gave SA resource list given to RN, who will provide patient with list.                         Patient Goals and CMS Choice        Expected Discharge Plan and Services                                                Prior Living Arrangements/Services                       Activities of Daily Living Home Assistive Devices/Equipment: None ADL Screening (condition at time of admission) Patient's cognitive ability adequate to safely complete daily activities?: Yes Is the patient deaf or have difficulty hearing?: No Does the patient have difficulty seeing, even when wearing glasses/contacts?: No Does the patient have difficulty concentrating, remembering, or making decisions?: No Patient able to express need for assistance with ADLs?: Yes Does the patient have difficulty dressing or bathing?: No Independently performs ADLs?: Yes (appropriate for developmental age) Does the patient have difficulty walking or climbing stairs?: Yes Weakness of Legs: Both Weakness of Arms/Hands: Both  Permission Sought/Granted                  Emotional Assessment              Admission  diagnosis:  Hypokalemia [E87.6] Colitis [K52.9] Atypical chest pain [R07.89] Chest pain, rule out acute myocardial infarction [R07.9] Patient Active Problem List   Diagnosis Date Noted   Alcohol withdrawal delirium, acute, mixed level of activity ([place]) 11/12/2021   Chest pain, rule out acute myocardial infarction 11/11/2021   Right knee pain 10/26/2021   Lumbar radiculopathy 10/26/2021   Hypokalemia 04/26/2021   Abnormal LFTs 04/26/2021   Shortness of breath 04/26/2021   Neck mass 07/15/2020   Annual physical exam 07/15/2020   Tobacco dependence 07/15/2020   History of suicidal ideation 05/29/2020   Alcohol dependence with alcohol-induced mood disorder ([place]) 05/26/2020   Chronic maxillary sinusitis 05/26/2020   Brain atrophy ([place]) 05/26/2020   Depression, recurrent ([place]) 04/29/2020   Overweight (BMI 25.0-29.9) 09/05/2019   Alcohol-induced acute pancreatitis 02/07/2019   Atherosclerosis of native coronary artery of native heart with stable angina pectoris ([place]) 09/03/2018   Ground glass opacity present on imaging of lung 08/21/2018   Lung nodule 08/21/2018   CAD (coronary artery disease) 08/21/2018   Pleural effusion 08/02/2018   COPD (chronic obstructive pulmonary disease) ([place]) 08/02/2018   Pancreatic pleural effusion 08/02/2018   Alcohol  abuse 07/30/2018   Pancreatic pseudocyst    Pneumonia 07/23/2018   Bilateral impacted cerumen 05/29/2018   Pancreatitis 03/02/2018   Hepatic steatosis 01/19/2018   Stress fracture of left foot 01/19/2018   Elevated liver enzymes 09/19/2017   Hyperlipidemia 09/19/2017   Macrocytosis without anemia 06/05/2017   Anxiety and depression 04/13/2017   HTN (hypertension) 04/13/2017   Abnormal EKG 04/13/2017   Tobacco abuse 02/08/2017   Skin mass 08/12/2016   Neuropathy 02/05/2015   Gastroesophageal reflux disease 02/05/2015   Environmental allergies 02/05/2015   PCP:  McLean-Scocuzza, Nino Glow, MD Pharmacy:   [id number] [place],  [id number]: [phone]: [address]Phone: [phone]: [phone]    Social Determinants of Health (SDOH) Interventions    Readmission Risk Interventions     No data to display

## 2021-11-14 NOTE — Progress Notes (Addendum)
PROGRESS NOTE    Darrell Santiago  BHA:[id number] DOB: Apr 08, 1966 DOA: 11/11/2021 PCP: McLean-Scocuzza, Nino Glow, MD    Assessment & Plan:   Principal Problem:   Alcohol withdrawal delirium, acute, mixed level of activity ([place]) Active Problems:   Anxiety and depression   CAD (coronary artery disease)   Alcohol abuse   Chest pain, rule out acute myocardial infarction  Assessment and Plan:  Acute alcohol withdrawal: received alcohol cessation counseling. Continue on CIWA protocol. Pt refused resources to help quit drinking and says he can stop drinking on his own    Acute toxic encephalopathy: improved. Likely secondary to alcohol abuse    Intractable nausea and vomiting/epigastric pain & diarrhea: no diarrhea today so far. C. diff antigen is positive but toxin neg. Will hold off of po vanco for now. Stool cx is pending. ACS r/o   Thrombocytopenia: likely secondary to alcohol abuse. Will continue to monitor    Hypokalemia: potassium ordered   Hypomagnesemia: mg sulfate ordered  Hypophosphatemia: potassium phosphate ordered    HTN: continue on amlodipine     HLD: continue on statin    COPD: w/o exacerbation. Continue on bronchodilators   GERD: continue on PPI      DVT prophylaxis:  SCDs Code Status: full  Family Communication: discussed pt's care w/ pt's wife, Lissa, at bedside and answered their questions  Disposition Plan: likely d/c back home   Level of care: Telemetry Cardiac  Status is: Inpatient Remains inpatient appropriate because: severity of illness  Consultants:    Procedures:  Antimicrobials:   Subjective: Pt c/o fatigue   Objective: Vitals:   11/13/21 2031 11/13/21 2328 11/14/21 0336 11/14/21 0748  BP: 136/75 (!) 129/93 132/81 116/77  Pulse: 65 60 63 68  Resp: '15 16 16 18  '$ Temp: 98.3 F (36.8 C) 97.9 F (36.6 C) 99 F (37.2 C) 98.6 F (37 C)  TempSrc: Axillary  Oral   SpO2: 93% 94% 96% 97%  Weight:      Height:         Intake/Output Summary (Last 24 hours) at 11/14/2021 0803 Last data filed at 11/14/2021 0534 Gross per 24 hour  Intake --  Output 800 ml  Net -800 ml   Filed Weights   11/11/21 1739 11/13/21 0427  Weight: 86.2 kg 81.9 kg    Examination:  General exam: Appears calm & comfortable  Respiratory system: clear breath sounds b/l  Cardiovascular system: S1/S2+. No rubs or clicks  Gastrointestinal system: Abd is soft, NT, ND & hypoactive bowel sounds  Central nervous system: Awake. Moves all extremities  Psychiatry: Judgement and insight has improved. Flat mood and affect    Data Reviewed: I have personally reviewed following labs and imaging studies  CBC: Recent Labs  Lab 11/11/21 1746 11/12/21 0507 11/13/21 0704 11/14/21 0634  WBC 8.2 8.5 8.7 6.4  HGB 15.3 13.7 14.1 13.9  HCT 44.2 39.1 42.0 40.6  MCV 99.1 100.0 102.7* 101.5*  PLT 104* 80* 65* 74*   Basic Metabolic Panel: Recent Labs  Lab 11/11/21 1746 11/12/21 0507 11/13/21 0704 11/14/21 0634  NA 142 144 140 134*  K 2.5* 3.1* 3.2* 2.6*  CL 99 102 103 97*  CO2 '24 27 25 27  '$ GLUCOSE 120* 129* 96 96  BUN '18 16 9 '$ <5*  CREATININE 0.85 0.68 0.60* 0.52*  CALCIUM 8.9 8.5* 9.1 8.7*  MG  --   --   --  1.4*  PHOS  --   --   --  <  1.0*   GFR: Estimated Creatinine Clearance: 105.6 mL/min (A) (by C-G formula based on SCr of 0.52 mg/dL (L)). Liver Function Tests: Recent Labs  Lab 11/11/21 1834 11/12/21 0507 11/13/21 0704 11/14/21 0634  AST 162* 106* 81* 85*  ALT 68* 54* 46* 48*  ALKPHOS 103 79 90 89  BILITOT 3.0* 2.5* 2.4* 1.9*  PROT 7.5 6.4* 6.6 6.4*  ALBUMIN 4.5 3.8 4.0 3.7   Recent Labs  Lab 11/11/21 1834  LIPASE 37   No results for input(s): "AMMONIA" in the last 168 hours. Coagulation Profile: No results for input(s): "INR", "PROTIME" in the last 168 hours. Cardiac Enzymes: No results for input(s): "CKTOTAL", "CKMB", "CKMBINDEX", "TROPONINI" in the last 168 hours. BNP (last 3 results) No results for  input(s): "PROBNP" in the last 8760 hours. HbA1C: No results for input(s): "HGBA1C" in the last 72 hours. CBG: Recent Labs  Lab 11/12/21 2128  GLUCAP 101*   Lipid Profile: Recent Labs    11/12/21 0507  CHOL 192  HDL 99  LDLCALC 84  TRIG 44  CHOLHDL 1.9   Thyroid Function Tests: Recent Labs    11/11/21 2259  TSH 0.820   Anemia Panel: No results for input(s): "VITAMINB12", "FOLATE", "FERRITIN", "TIBC", "IRON", "RETICCTPCT" in the last 72 hours. Sepsis Labs: No results for input(s): "PROCALCITON", "LATICACIDVEN" in the last 168 hours.  Recent Results (from the past 240 hour(s))  SARS Coronavirus 2 by RT PCR (hospital order, performed in [facility] hospital lab) *cepheid single result test* Anterior Nasal Swab     Status: None   Collection Time: 11/11/21  7:25 PM   Specimen: Anterior Nasal Swab  Result Value Ref Range Status   SARS Coronavirus 2 by RT PCR NEGATIVE NEGATIVE Final    Comment: (NOTE) SARS-CoV-2 target nucleic acids are NOT DETECTED.  The SARS-CoV-2 RNA is generally detectable in upper and lower respiratory specimens during the acute phase of infection. The lowest concentration of SARS-CoV-2 viral copies this assay can detect is 250 copies / mL. A negative result does not preclude SARS-CoV-2 infection and should not be used as the sole basis for treatment or other patient management decisions.  A negative result may occur with improper specimen collection / handling, submission of specimen other than nasopharyngeal swab, presence of viral mutation(s) within the areas targeted by this assay, and inadequate number of viral copies (<250 copies / mL). A negative result must be combined with clinical observations, patient history, and epidemiological information.  Fact Sheet for Patients:   https://www.patel.info/  Fact Sheet for Healthcare Providers: https://hall.com/  This test is not yet approved or  cleared by  the [place] FDA and has been authorized for detection and/or diagnosis of SARS-CoV-2 by FDA under an Emergency Use Authorization (EUA).  This EUA will remain in effect (meaning this test can be used) for the duration of the COVID-19 declaration under Section 564(b)(1) of the Act, 21 U.S.C. section 360bbb-3(b)(1), unless the authorization is terminated or revoked sooner.  Performed at [facility], [place], [place] [address]   C Difficile Quick Screen w PCR reflex     Status: Abnormal   Collection Time: 11/12/21  8:30 AM   Specimen: Stool  Result Value Ref Range Status   C Diff antigen POSITIVE (A) NEGATIVE Final   C Diff toxin NEGATIVE NEGATIVE Final   C Diff interpretation Results are indeterminate. See PCR results.  Final    Comment: Performed at [facility], [place],  [place] [address]  C. Diff by PCR, Reflexed     Status: None   Collection Time: 11/12/21  8:30 AM  Result Value Ref Range Status   Toxigenic C. Difficile by PCR NEGATIVE NEGATIVE Final    Comment: Patient is colonized with non toxigenic C. difficile. May not need treatment unless significant symptoms are present. Performed at [facility], [address] [place], [place] [address]          Radiology Studies: No results found.      Scheduled Meds:  acidophilus  1 capsule Oral TID with meals   amLODipine  2.5 mg Oral Daily   aspirin EC  81 mg Oral Daily   cyanocobalamin  1,000 mcg Oral Daily   fluticasone furoate-vilanterol  1 puff Inhalation Daily   folic acid  1 mg Oral Daily   multivitamin with minerals  1 tablet Oral Daily   pantoprazole  20 mg Oral Daily   potassium chloride  40 mEq Oral BID   pravastatin  40 mg Oral QHS   sertraline  50 mg Oral Q1200   sodium chloride flush  3 mL Intravenous Q12H   thiamine  100 mg Oral Daily   Continuous Infusions:  sodium chloride 100 mL/hr at 11/14/21 0339   magnesium sulfate bolus IVPB      potassium PHOSPHATE IVPB (in mmol)     promethazine (PHENERGAN) injection (IM or IVPB)       LOS: 3 days    Time spent: 36 mins    Wyvonnia Dusky, MD [facility] Pager [phone]  If 7PM-7AM, please contact night-coverage [email or web address] 11/14/2021, 8:03 AM

## 2021-11-15 DIAGNOSIS — D696 Thrombocytopenia, unspecified: Secondary | ICD-10-CM | POA: Diagnosis not present

## 2021-11-15 DIAGNOSIS — E876 Hypokalemia: Secondary | ICD-10-CM | POA: Diagnosis not present

## 2021-11-15 DIAGNOSIS — F10231 Alcohol dependence with withdrawal delirium: Secondary | ICD-10-CM | POA: Diagnosis not present

## 2021-11-15 LAB — CBC
HCT: 44.2 % (ref 39.0–52.0)
Hemoglobin: 15.2 g/dL (ref 13.0–17.0)
MCH: 35 pg — ABNORMAL HIGH (ref 26.0–34.0)
MCHC: 34.4 g/dL (ref 30.0–36.0)
MCV: 101.8 fL — ABNORMAL HIGH (ref 80.0–100.0)
Platelets: 106 10*3/uL — ABNORMAL LOW (ref 150–400)
RBC: 4.34 MIL/uL (ref 4.22–5.81)
RDW: 12.8 % (ref 11.5–15.5)
WBC: 6.8 10*3/uL (ref 4.0–10.5)
nRBC: 0 % (ref 0.0–0.2)

## 2021-11-15 LAB — COMPREHENSIVE METABOLIC PANEL
ALT: 53 U/L — ABNORMAL HIGH (ref 0–44)
AST: 83 U/L — ABNORMAL HIGH (ref 15–41)
Albumin: 4.1 g/dL (ref 3.5–5.0)
Alkaline Phosphatase: 91 U/L (ref 38–126)
Anion gap: 14 (ref 5–15)
BUN: 7 mg/dL (ref 6–20)
CO2: 27 mmol/L (ref 22–32)
Calcium: 9.7 mg/dL (ref 8.9–10.3)
Chloride: 97 mmol/L — ABNORMAL LOW (ref 98–111)
Creatinine, Ser: 0.56 mg/dL — ABNORMAL LOW (ref 0.61–1.24)
GFR, Estimated: >60 mL/min (ref >60)
Glucose, Bld: 116 mg/dL — ABNORMAL HIGH (ref 70–99)
Potassium: 3.3 mmol/L — ABNORMAL LOW (ref 3.5–5.1)
Sodium: 138 mmol/L (ref 135–145)
Total Bilirubin: 1.9 mg/dL — ABNORMAL HIGH (ref 0.3–1.2)
Total Protein: 7 g/dL (ref 6.5–8.1)

## 2021-11-15 LAB — MAGNESIUM
Magnesium: 1.6 mg/dL — ABNORMAL LOW (ref 1.7–2.4)
Magnesium: 2.4 mg/dL (ref 1.7–2.4)

## 2021-11-15 LAB — POTASSIUM: Potassium: 3 mmol/L — ABNORMAL LOW (ref 3.5–5.1)

## 2021-11-15 LAB — PHOSPHORUS
Phosphorus: 1.8 mg/dL — ABNORMAL LOW (ref 2.5–4.6)
Phosphorus: 3.1 mg/dL (ref 2.5–4.6)

## 2021-11-15 MED ORDER — POTASSIUM CHLORIDE CRYS ER 20 MEQ PO TBCR
40.0000 meq | EXTENDED_RELEASE_TABLET | Freq: Once | ORAL | Status: AC
Start: 2021-11-15 — End: 2021-11-15
  Administered 2021-11-15: 40 meq via ORAL
  Filled 2021-11-15: qty 2

## 2021-11-15 MED ORDER — MAGNESIUM SULFATE 4 GM/100ML IV SOLN
4.0000 g | Freq: Once | INTRAVENOUS | Status: AC
  Administered 2021-11-15: 4 g via INTRAVENOUS
  Filled 2021-11-15: qty 100

## 2021-11-15 MED ORDER — POTASSIUM CHLORIDE CRYS ER 20 MEQ PO TBCR
60.0000 meq | EXTENDED_RELEASE_TABLET | ORAL | Status: AC
  Administered 2021-11-15: 60 meq via ORAL
  Filled 2021-11-15: qty 3

## 2021-11-15 MED ORDER — DEXTROSE 5 % IV SOLN
30.0000 mmol | Freq: Once | INTRAVENOUS | Status: AC
  Administered 2021-11-15: 30 mmol via INTRAVENOUS
  Filled 2021-11-15: qty 10

## 2021-11-15 NOTE — Progress Notes (Signed)
35- Patient discharged with family, to private vehicle via wheelchair, with staff. All belongings noted sent with patient and family member. IV removed and Discharge instructions given by previous shift nurse. No s/s of distress noted.

## 2021-11-15 NOTE — Discharge Summary (Signed)
Physician Discharge Summary  Darrell Santiago:[id number] DOB: [birth year] DOA: 11/11/2021  PCP: McLean-Scocuzza, Nino Glow, MD  Admit date: 11/11/2021 Discharge date: 11/15/2021  Admitted From: home  Disposition:  home   Recommendations for Outpatient Follow-up:  Follow up with PCP in 1-2 days to get labs to check electrolyte levels (K, Phos, Mg)   Home Health: no  Equipment/Devices:   Discharge Condition: stable  CODE STATUS: full  Diet recommendation: Heart Healthy  Brief/Interim Summary: HPI was taken from Dr. Ara Kussmaul: Darrell Santiago is a 56 y.o. male with a known history of hypertension, hyperlipidemia, COPD, asthma alcohol use disorder presents to the emergency department for evaluation of chest pain.  Patient was in a usual state of health until this morning when he developed central chest pain described as 7 out of 10, central radiating into his face associated with nausea vomiting and shortness of breath.  He also reports normal stool first thing in the morning that becomes progressively loose over the day..  Stated generalized weakness   Otherwise there has been no change in status. Patient has been taking medication as prescribed and there has been no recent change in medication or diet.  No recent antibiotics.  There has been no recent illness, hospitalizations, travel or sick contacts.     EMS/ED Course: Patient received Ativan, Zofran, magnesium, potassium, normal saline, Rocephin, Flagyl. Medical admission has been requested for further management of colitis, chest pain.  As per Dr. Jimmye Norman 7/29-7/31/23: Pt was found to have acute alcohol w/drawal for which pt was treated w/ CIWA protocol. Pt refused resources to help quit drinking and says he can stop drinking on his own. Of note, pt had several electrolyte abnormalities including hypokalemia, hypophosphatemia & hypomagnesemia and all which were repleated prior to pt d/c. Pt was ambulating and transferring independently so  therapy was not indicated. For more information, please see previous progress notes.   Discharge Diagnoses:  Principal Problem:   Alcohol withdrawal delirium, acute, mixed level of activity (HCC) Active Problems:   Anxiety and depression   CAD (coronary artery disease)   Alcohol abuse   Chest pain, rule out acute myocardial infarction Acute alcohol withdrawal: received alcohol cessation counseling. Continue on CIWA protocol. Pt refused resources to help quit drinking and says he can stop drinking on his own    Acute toxic encephalopathy: back to baseline. Likely secondary to alcohol abuse    Intractable nausea and vomiting/epigastric pain & diarrhea: no diarrhea today so far. C. diff antigen is positive but toxin neg. Will hold off of po vanco for now. Stool cx showed no salmonella or shigella recovered. ACS r/o. Resolved  Thrombocytopenia: likely secondary to alcohol abuse. Will continue to monitor    Hypokalemia: KCl repleated  Hypomagnesemia: Mg Sulfate 4g x1 ordered   Hypophosphatemia: potassium phosphate was ordered    HTN: continue on amlodipine     HLD: continue on statin    COPD: w/o exacerbation. Continue on bronchodilators   GERD: continue on PPI    Discharge Instructions  Discharge Instructions     Diet - low sodium heart healthy   Complete by: As directed    Discharge instructions   Complete by: As directed    F/u w/ PCP in 1-2 days to get labs to check electrolytes (potassium, magnesium, & phosphorus)   Increase activity slowly   Complete by: As directed       Allergies as of 11/15/2021   No Known Allergies      Medication  List     STOP taking these medications    doxycycline 100 MG tablet Commonly known as: VIBRA-TABS   erythromycin ophthalmic ointment   guaiFENesin 600 MG 12 hr tablet Commonly known as: MUCINEX   meloxicam 15 MG tablet Commonly known as: MOBIC       TAKE these medications    acetaminophen 325 MG tablet Commonly  known as: TYLENOL Take 2 tablets (650 mg total) by mouth every 6 (six) hours as needed for mild pain (or Fever >/= 101).   amLODipine 2.5 MG tablet Commonly known as: NORVASC Take 2.5 mg by mouth daily.   aspirin 81 MG chewable tablet Chew 81 mg by mouth daily.   budesonide-formoterol 160-4.5 MCG/ACT inhaler Commonly known as: Symbicort Inhale 2 puffs into the lungs 2 (two) times daily. Rinse mouth after use   cyanocobalamin 1000 MCG tablet Commonly known as: VITAMIN B12 Take 1,000 mcg by mouth daily.   cyclobenzaprine 5 MG tablet Commonly known as: FLEXERIL Take 5 mg by mouth 3 (three) times daily as needed.   folic acid 1 MG tablet Commonly known as: FOLVITE Take 1 tablet (1 mg total) by mouth daily.   hydrOXYzine 25 MG tablet Commonly known as: ATARAX TAKE 1 TABLET BY MOUTH DAILY AS NEEDED.   ipratropium-albuterol 0.5-2.5 (3) MG/3ML Soln Commonly known as: DUONEB Inhale 3 mLs into the lungs every 6 (six) hours as needed.   LORazepam 0.5 MG tablet Commonly known as: ATIVAN TAKE 1 TABLET (0.5 MG TOTAL) BY MOUTH DAILY AS NEEDED FOR ANXIETY OR SLEEP.   losartan 50 MG tablet Commonly known as: COZAAR Take 1 tablet (50 mg total) by mouth daily.   Multi-Vitamin tablet Take 1 tablet by mouth daily.   pantoprazole 20 MG tablet Commonly known as: PROTONIX Take 1 tablet (20 mg total) by mouth daily. 30 minutes before food   potassium chloride SA 20 MEQ tablet Commonly known as: KLOR-CON M Take 1 tablet (20 mEq total) by mouth daily.   pravastatin 40 MG tablet Commonly known as: PRAVACHOL Take 1 tablet (40 mg total) by mouth at bedtime.   sertraline 50 MG tablet Commonly known as: ZOLOFT Take 1 tablet (50 mg total) by mouth daily at 12 noon. Note tablet changed from 1/2 of 100 mg to 50 mg qd        No Known Allergies  Consultations:    Procedures/Studies: CT ABDOMEN PELVIS W CONTRAST  Result Date: 11/11/2021 CLINICAL DATA:  Chest pain with nausea,  vomiting and loss of appetite. EXAM: CT ABDOMEN AND PELVIS WITH CONTRAST TECHNIQUE: Multidetector CT imaging of the abdomen and pelvis was performed using the standard protocol following bolus administration of intravenous contrast. RADIATION DOSE REDUCTION: This exam was performed according to the departmental dose-optimization program which includes automated exposure control, adjustment of the mA and/or kV according to patient size and/or use of iterative reconstruction technique. CONTRAST:  118m OMNIPAQUE IOHEXOL 350 MG/ML SOLN COMPARISON:  April 26, 2021 FINDINGS: Lower chest: No acute abnormality. Hepatobiliary: There is diffuse fatty infiltration of the liver parenchyma. No focal liver abnormality is seen. Status post cholecystectomy. No biliary dilatation. Pancreas: Unremarkable. No pancreatic ductal dilatation or surrounding inflammatory changes. Spleen: No splenic injury or perisplenic hematoma. Adrenals/Urinary Tract: The right adrenal gland is unremarkable. A 1.8 cm diameter low-attenuation (approximately -17.19 Hounsfield units) left adrenal mass is noted. Kidneys are normal in size, without renal calculi or hydronephrosis. A 1.5 cm simple cyst is seen within the mid left kidney. No additional follow-up or  imaging is recommended. The urinary bladder is poorly distended and subsequently limited in evaluation. Stomach/Bowel: Stomach is within normal limits. Appendix appears normal. No evidence of bowel dilatation. Moderate to marked severity diffuse colonic wall thickening is seen. Numerous diverticula are noted throughout the sigmoid colon. Vascular/Lymphatic: Aortic atherosclerosis. No enlarged abdominal or pelvic lymph nodes. Reproductive: Prostate is unremarkable. Other: No abdominal wall hernia or abnormality. No abdominopelvic ascites. Musculoskeletal: No acute or significant osseous findings. IMPRESSION: 1. Moderate to marked severity diffuse infectious or inflammatory colitis. 2. Sigmoid  diverticulosis. 3. Hepatic steatosis. 4. Evidence of prior cholecystectomy. 5. Left adrenal mass consistent with a lipid-rich benign adenoma. No follow-up imaging is recommended. JACR 2017 Aug; 14(8):1038-44, JCAT 2016 Mar-Apr; 40(2):194-200, Urol J 2006 Spring; 3(2):71-4. Aortic Atherosclerosis (ICD10-I70.0). Electronically Signed   By: Virgina Norfolk M.D.   On: 11/11/2021 20:54   CT Angio Chest PE W and/or Wo Contrast  Result Date: 11/11/2021 CLINICAL DATA:  Chest pain. EXAM: CT ANGIOGRAPHY CHEST WITH CONTRAST TECHNIQUE: Multidetector CT imaging of the chest was performed using the standard protocol during bolus administration of intravenous contrast. Multiplanar CT image reconstructions and MIPs were obtained to evaluate the vascular anatomy. RADIATION DOSE REDUCTION: This exam was performed according to the departmental dose-optimization program which includes automated exposure control, adjustment of the mA and/or kV according to patient size and/or use of iterative reconstruction technique. CONTRAST:  156m OMNIPAQUE IOHEXOL 350 MG/ML SOLN COMPARISON:  April 26, 2021 FINDINGS: Cardiovascular: There is mild calcification of the aortic arch, without evidence of aortic aneurysm. Satisfactory opacification of the pulmonary arteries to the segmental level. No evidence of pulmonary embolism. Normal heart size with mild coronary artery calcification. No pericardial effusion. Mediastinum/Nodes: No enlarged mediastinal, hilar, or axillary lymph nodes. Thyroid gland, trachea, and esophagus demonstrate no significant findings. Lungs/Pleura: Very mild, hazy atelectasis is seen along the posterior aspect of the right lower lobe. A stable thin walled parenchymal cyst is seen within the posterior aspect of the left lung base. There is no evidence of acute infiltrate, pleural effusion or pneumothorax. Upper Abdomen: There is diffuse fatty infiltration of the liver parenchyma. Multiple surgical clips are seen within  the gallbladder fossa. There is a small hiatal hernia. Musculoskeletal: No chest wall abnormality. No acute or significant osseous findings. Review of the MIP images confirms the above findings. IMPRESSION: 1. No evidence of pulmonary embolism or other acute intrathoracic process. 2. Mild coronary artery calcification. 3. Small hiatal hernia. 4. Hepatic steatosis. 5. Evidence of prior cholecystectomy. Aortic Atherosclerosis (ICD10-I70.0). Electronically Signed   By: TVirgina NorfolkM.D.   On: 11/11/2021 20:47   DG Chest 2 View  Result Date: 11/11/2021 CLINICAL DATA:  Chest pain EXAM: CHEST - 2 VIEW COMPARISON:  04/26/2021 FINDINGS: Cardiac and mediastinal contours are within normal limits given slightly lower lung volumes. No focal pulmonary opacity. No pleural effusion or pneumothorax. No acute osseous abnormality. IMPRESSION: No acute cardiopulmonary process. Electronically Signed   By: AMerilyn BabaM.D.   On: 11/11/2021 18:47   (Echo, Carotid, EGD, Colonoscopy, ERCP)    Subjective: Pt denies any complaints    Discharge Exam: Vitals:   11/15/21 0819 11/15/21 1152  BP: 107/75 136/83  Pulse: (!) 101 77  Resp: 18 16  Temp: 99.8 F (37.7 C) 98.3 F (36.8 C)  SpO2: 99% 98%   Vitals:   11/15/21 0059 11/15/21 0350 11/15/21 0819 11/15/21 1152  BP:  113/75 107/75 136/83  Pulse: 65 97 (!) 101 77  Resp:  18 18 16  Temp:  98.7 F (37.1 C) 99.8 F (37.7 C) 98.3 F (36.8 C)  TempSrc:  Oral    SpO2:  97% 99% 98%  Weight:      Height:        General: Pt is alert, awake, not in acute distress Cardiovascular: S1/S2 +, no rubs, no gallops Respiratory: CTA bilaterally, no wheezing, no rhonchi Abdominal: Soft, NT, ND, bowel sounds + Extremities: no edema, no cyanosis    The results of significant diagnostics from this hospitalization (including imaging, microbiology, ancillary and laboratory) are listed below for reference.     Microbiology: Recent Results (from the past 240 hour(s))   SARS Coronavirus 2 by RT PCR (hospital order, performed in [facility] hospital lab) *cepheid single result test* Anterior Nasal Swab     Status: None   Collection Time: 11/11/21  7:25 PM   Specimen: Anterior Nasal Swab  Result Value Ref Range Status   SARS Coronavirus 2 by RT PCR NEGATIVE NEGATIVE Final    Comment: (NOTE) SARS-CoV-2 target nucleic acids are NOT DETECTED.  The SARS-CoV-2 RNA is generally detectable in upper and lower respiratory specimens during the acute phase of infection. The lowest concentration of SARS-CoV-2 viral copies this assay can detect is 250 copies / mL. A negative result does not preclude SARS-CoV-2 infection and should not be used as the sole basis for treatment or other patient management decisions.  A negative result may occur with improper specimen collection / handling, submission of specimen other than nasopharyngeal swab, presence of viral mutation(s) within the areas targeted by this assay, and inadequate number of viral copies (<250 copies / mL). A negative result must be combined with clinical observations, patient history, and epidemiological information.  Fact Sheet for Patients:   https://www.patel.info/  Fact Sheet for Healthcare Providers: https://hall.com/  This test is not yet approved or  cleared by the [place] FDA and has been authorized for detection and/or diagnosis of SARS-CoV-2 by FDA under an Emergency Use Authorization (EUA).  This EUA will remain in effect (meaning this test can be used) for the duration of the COVID-19 declaration under Section 564(b)(1) of the Act, 21 U.S.C. section 360bbb-3(b)(1), unless the authorization is terminated or revoked sooner.  Performed at [facility], [address] [place], [place] [address]   Stool culture     Status: None (Preliminary result)   Collection Time: 11/12/21  8:30 AM   Specimen: Stool  Result Value Ref Range  Status   Salmonella/Shigella Screen Final report  Final   Campylobacter Culture PENDING  Incomplete   E coli, Shiga toxin Assay Negative Negative Final    Comment: (NOTE) Performed At: [facility] [place], [place] [id number] Rush Farmer MD WE:[id number]   C Difficile Quick Screen w PCR reflex     Status: Abnormal   Collection Time: 11/12/21  8:30 AM   Specimen: Stool  Result Value Ref Range Status   C Diff antigen POSITIVE (A) NEGATIVE Final   C Diff toxin NEGATIVE NEGATIVE Final   C Diff interpretation Results are indeterminate. See PCR results.  Final    Comment: Performed at [facility], [address] [place], [place] [address]  C. Diff by PCR, Reflexed     Status: None   Collection Time: 11/12/21  8:30 AM  Result Value Ref Range Status   Toxigenic C. Difficile by PCR NEGATIVE NEGATIVE Final    Comment: Patient is colonized with non toxigenic C. difficile. May not need  treatment unless significant symptoms are present. Performed at [facility], [address] [place], [place] [address]   STOOL CULTURE REFLEX - RSASHR     Status: None   Collection Time: 11/12/21  8:30 AM  Result Value Ref Range Status   Stool Culture result 1 (RSASHR) Comment  Final    Comment: (NOTE) No Salmonella or Shigella recovered. Performed At: [facility] [place], [place] [id number] Rush Farmer MD XB:[id number]      Labs: BNP (last 3 results) Recent Labs    04/26/21 1933  BNP 30.8   Basic Metabolic Panel: Recent Labs  Lab 11/11/21 1746 11/12/21 0507 11/13/21 0704 11/14/21 0634 11/15/21 0639 11/15/21 1356  NA 142 144 140 134* 138  --   K 2.5* 3.1* 3.2* 2.6* 3.3* 3.0*  CL 99 102 103 97* 97*  --   CO2 '24 27 25 27 27  '$ --   GLUCOSE 120* 129* 96 96 116*  --   BUN '18 16 9 '$ <5* 7  --   CREATININE 0.85 0.68 0.60* 0.52* 0.56*  --   CALCIUM 8.9 8.5* 9.1 8.7* 9.7  --   MG  --   --   --  1.4* 1.6* 2.4  PHOS  --   --    --  <1.0* 1.8* 3.1   Liver Function Tests: Recent Labs  Lab 11/11/21 1834 11/12/21 0507 11/13/21 0704 11/14/21 0634 11/15/21 0639  AST 162* 106* 81* 85* 83*  ALT 68* 54* 46* 48* 53*  ALKPHOS 103 79 90 89 91  BILITOT 3.0* 2.5* 2.4* 1.9* 1.9*  PROT 7.5 6.4* 6.6 6.4* 7.0  ALBUMIN 4.5 3.8 4.0 3.7 4.1   Recent Labs  Lab 11/11/21 1834  LIPASE 37   No results for input(s): "AMMONIA" in the last 168 hours. CBC: Recent Labs  Lab 11/11/21 1746 11/12/21 0507 11/13/21 0704 11/14/21 0634 11/15/21 0639  WBC 8.2 8.5 8.7 6.4 6.8  HGB 15.3 13.7 14.1 13.9 15.2  HCT 44.2 39.1 42.0 40.6 44.2  MCV 99.1 100.0 102.7* 101.5* 101.8*  PLT 104* 80* 65* 74* 106*   Cardiac Enzymes: No results for input(s): "CKTOTAL", "CKMB", "CKMBINDEX", "TROPONINI" in the last 168 hours. BNP: Invalid input(s): "POCBNP" CBG: Recent Labs  Lab 11/12/21 2128  GLUCAP 101*   D-Dimer No results for input(s): "DDIMER" in the last 72 hours. Hgb A1c No results for input(s): "HGBA1C" in the last 72 hours. Lipid Profile No results for input(s): "CHOL", "HDL", "LDLCALC", "TRIG", "CHOLHDL", "LDLDIRECT" in the last 72 hours. Thyroid function studies No results for input(s): "TSH", "T4TOTAL", "T3FREE", "THYROIDAB" in the last 72 hours.  Invalid input(s): "FREET3" Anemia work up No results for input(s): "VITAMINB12", "FOLATE", "FERRITIN", "TIBC", "IRON", "RETICCTPCT" in the last 72 hours. Urinalysis    Component Value Date/Time   COLORURINE DARK YELLOW 08/27/2018 0908   APPEARANCEUR CLOUDY (A) 08/27/2018 0908   LABSPEC 1.022 08/27/2018 0908   PHURINE 6.5 08/27/2018 0908   GLUCOSEU CANCELED 05/22/2020 0812   GLUCOSEU NEGATIVE 06/05/2017 0852   HGBUR NEGATIVE 08/27/2018 0908   BILIRUBINUR NEGATIVE 07/23/2018 1826   KETONESUR NEGATIVE 08/27/2018 0908   PROTEINUR CANCELED 05/22/2020 0812   PROTEINUR TRACE (A) 08/27/2018 0908   UROBILINOGEN 1.0 06/05/2017 0852   NITRITE NEGATIVE 08/27/2018 0908    LEUKOCYTESUR TRACE (A) 08/27/2018 0908   Sepsis Labs Recent Labs  Lab 11/12/21 0507 11/13/21 0704 11/14/21 0634 11/15/21 0639  WBC 8.5 8.7 6.4 6.8   Microbiology Recent Results (from the past 240  hour(s))  SARS Coronavirus 2 by RT PCR (hospital order, performed in [facility] hospital lab) *cepheid single result test* Anterior Nasal Swab     Status: None   Collection Time: 11/11/21  7:25 PM   Specimen: Anterior Nasal Swab  Result Value Ref Range Status   SARS Coronavirus 2 by RT PCR NEGATIVE NEGATIVE Final    Comment: (NOTE) SARS-CoV-2 target nucleic acids are NOT DETECTED.  The SARS-CoV-2 RNA is generally detectable in upper and lower respiratory specimens during the acute phase of infection. The lowest concentration of SARS-CoV-2 viral copies this assay can detect is 250 copies / mL. A negative result does not preclude SARS-CoV-2 infection and should not be used as the sole basis for treatment or other patient management decisions.  A negative result may occur with improper specimen collection / handling, submission of specimen other than nasopharyngeal swab, presence of viral mutation(s) within the areas targeted by this assay, and inadequate number of viral copies (<250 copies / mL). A negative result must be combined with clinical observations, patient history, and epidemiological information.  Fact Sheet for Patients:   https://www.patel.info/  Fact Sheet for Healthcare Providers: https://hall.com/  This test is not yet approved or  cleared by the [place] FDA and has been authorized for detection and/or diagnosis of SARS-CoV-2 by FDA under an Emergency Use Authorization (EUA).  This EUA will remain in effect (meaning this test can be used) for the duration of the COVID-19 declaration under Section 564(b)(1) of the Act, 21 U.S.C. section 360bbb-3(b)(1), unless the authorization is terminated or revoked  sooner.  Performed at [facility], [address] [place], [place] [address]   Stool culture     Status: None (Preliminary result)   Collection Time: 11/12/21  8:30 AM   Specimen: Stool  Result Value Ref Range Status   Salmonella/Shigella Screen Final report  Final   Campylobacter Culture PENDING  Incomplete   E coli, Shiga toxin Assay Negative Negative Final    Comment: (NOTE) Performed At: [facility] [place], [place] [id number] Rush Farmer MD QJ:[id number]   C Difficile Quick Screen w PCR reflex     Status: Abnormal   Collection Time: 11/12/21  8:30 AM   Specimen: Stool  Result Value Ref Range Status   C Diff antigen POSITIVE (A) NEGATIVE Final   C Diff toxin NEGATIVE NEGATIVE Final   C Diff interpretation Results are indeterminate. See PCR results.  Final    Comment: Performed at [facility], [address] [place], [place] [address]  C. Diff by PCR, Reflexed     Status: None   Collection Time: 11/12/21  8:30 AM  Result Value Ref Range Status   Toxigenic C. Difficile by PCR NEGATIVE NEGATIVE Final    Comment: Patient is colonized with non toxigenic C. difficile. May not need treatment unless significant symptoms are present. Performed at [facility], [address] [place], [place] [address]   STOOL CULTURE REFLEX - RSASHR     Status: None   Collection Time: 11/12/21  8:30 AM  Result Value Ref Range Status   Stool Culture result 1 (RSASHR) Comment  Final    Comment: (NOTE) No Salmonella or Shigella recovered. Performed At: [facility] [place], [place] [id number] Rush Farmer MD [id number]      Time coordinating discharge: Over 30 minutes  SIGNED:   Wyvonnia Dusky, MD  [facility] 11/15/2021, 2:46 PM Pager   If 7PM-7AM,  please contact night-coverage [email or web address]

## 2021-11-17 LAB — STOOL CULTURE: E coli, Shiga toxin Assay: NEGATIVE

## 2021-11-17 LAB — STOOL CULTURE REFLEX - CMPCXR

## 2021-11-17 LAB — STOOL CULTURE REFLEX - RSASHR

## 2021-12-02 ENCOUNTER — Ambulatory Visit (INDEPENDENT_AMBULATORY_CARE_PROVIDER_SITE_OTHER): Payer: BC Managed Care – PPO | Admitting: Podiatry

## 2021-12-02 ENCOUNTER — Encounter: Payer: Self-pay | Admitting: Podiatry

## 2021-12-02 DIAGNOSIS — Q666 Other congenital valgus deformities of feet: Secondary | ICD-10-CM

## 2021-12-02 DIAGNOSIS — M722 Plantar fascial fibromatosis: Secondary | ICD-10-CM | POA: Diagnosis not present

## 2021-12-02 NOTE — Progress Notes (Signed)
Subjective:  Patient ID: Darrell Santiago, male    DOB: [birth year],  MRN: [id number]  Chief Complaint  Patient presents with   Follow-up    Follow-up     56 y.o. male presents with the above complaint.  Patient presents with follow-up of bilateral plantar fasciitis injection helped considerably.  He still has some residual pain.  He is here to pick up his orthotics he denies any other acute complaints.   Review of Systems: Negative except as noted in the HPI. Denies N/V/F/Ch.  Past Medical History:  Diagnosis Date   Abnormal EKG    Allergy    Seasonal   Anxiety    Chicken pox    COVID-19    04/20/21   Depression    GERD (gastroesophageal reflux disease)    Hyperlipidemia    Hypertension    Urine incontinence     Current Outpatient Medications:    acetaminophen (TYLENOL) 325 MG tablet, Take 2 tablets (650 mg total) by mouth every 6 (six) hours as needed for mild pain (or Fever >/= 101)., Disp: , Rfl:    amLODipine (NORVASC) 2.5 MG tablet, Take 2.5 mg by mouth daily., Disp: , Rfl:    aspirin 81 MG chewable tablet, Chew 81 mg by mouth daily., Disp: , Rfl:    budesonide-formoterol (SYMBICORT) 160-4.5 MCG/ACT inhaler, Inhale 2 puffs into the lungs 2 (two) times daily. Rinse mouth after use, Disp: 1 each, Rfl: 2   cyclobenzaprine (FLEXERIL) 5 MG tablet, Take 5 mg by mouth 3 (three) times daily as needed., Disp: , Rfl:    folic acid (FOLVITE) 1 MG tablet, Take 1 tablet (1 mg total) by mouth daily. (Patient not taking: Reported on 11/11/2021), Disp: 30 tablet, Rfl: 3   hydrOXYzine (ATARAX/VISTARIL) 25 MG tablet, TAKE 1 TABLET BY MOUTH DAILY AS NEEDED., Disp: 90 tablet, Rfl: 2   ipratropium-albuterol (DUONEB) 0.5-2.5 (3) MG/3ML SOLN, Inhale 3 mLs into the lungs every 6 (six) hours as needed., Disp: 360 mL, Rfl: 11   LORazepam (ATIVAN) 0.5 MG tablet, TAKE 1 TABLET (0.5 MG TOTAL) BY MOUTH DAILY AS NEEDED FOR ANXIETY OR SLEEP., Disp: 30 tablet, Rfl: 2   losartan (COZAAR) 50 MG tablet, Take 1  tablet (50 mg total) by mouth daily. (Patient not taking: Reported on 11/11/2021), Disp: 90 tablet, Rfl: 3   Multiple Vitamin (MULTI-VITAMIN) tablet, Take 1 tablet by mouth daily., Disp: , Rfl:    pantoprazole (PROTONIX) 20 MG tablet, Take 1 tablet (20 mg total) by mouth daily. 30 minutes before food (Patient not taking: Reported on 11/11/2021), Disp: 90 tablet, Rfl: 3   potassium chloride SA (KLOR-CON M) 20 MEQ tablet, Take 1 tablet (20 mEq total) by mouth daily. (Patient not taking: Reported on 11/11/2021), Disp: 90 tablet, Rfl: 1   pravastatin (PRAVACHOL) 40 MG tablet, Take 1 tablet (40 mg total) by mouth at bedtime., Disp: 90 tablet, Rfl: 3   sertraline (ZOLOFT) 50 MG tablet, Take 1 tablet (50 mg total) by mouth daily at 12 noon. Note tablet changed from 1/2 of 100 mg to 50 mg qd, Disp: 90 tablet, Rfl: 3   vitamin B-12 (CYANOCOBALAMIN) 1000 MCG tablet, Take 1,000 mcg by mouth daily. , Disp: , Rfl:   Social History   Tobacco Use  Smoking Status Every Day   Packs/day: 1.00   Years: 26.00   Total pack years: 26.00   Types: Cigarettes  Smokeless Tobacco Never  Tobacco Comments   little less than one ppd    No  Known Allergies Objective:  There were no vitals filed for this visit. There is no height or weight on file to calculate BMI. Constitutional Well developed. Well nourished.  Vascular Dorsalis pedis pulses palpable bilaterally. Posterior tibial pulses palpable bilaterally. Capillary refill normal to all digits.  No cyanosis or clubbing noted. Pedal hair growth normal.  Neurologic Normal speech. Oriented to person, place, and time. Epicritic sensation to light touch grossly present bilaterally.  Dermatologic Nails well groomed and normal in appearance. No open wounds. No skin lesions.  Orthopedic: Normal joint ROM without pain or crepitus bilaterally. No visible deformities. Tender to palpation at the calcaneal tuber bilaterally. No pain with calcaneal squeeze  bilaterally. Ankle ROM diminished range of motion bilaterally. Silfverskiold Test: positive bilaterally.   Radiographs: Taken and reviewed. No acute fractures or dislocations. No evidence of stress fracture.  Plantar heel spur absent. Posterior heel spur present.   Assessment:   1. Plantar fasciitis of left foot   2. Plantar fasciitis of right foot   3. Pes planovalgus     Plan:  Patient was evaluated and treated and all questions answered.  Plantar Fasciitis, bilaterally - XR reviewed as above.  - Educated on icing and stretching. Instructions given.  -Second injection delivered to the plantar fascia as below. - DME: Plantar fascial brace dispensed to support the medial longitudinal arch of the foot and offload pressure from the heel and prevent arch collapse during weightbearing - Pharmacologic management: None  Pes planovalgus -I explained to patient the etiology of pes planovalgus and relationship with Planter fasciitis and various treatment options were discussed.  Given patient foot structure in the setting of Planter fasciitis I believe patient will benefit from custom-made orthotics to help control the hindfoot motion support the arch of the foot and take the stress away from plantar fascial.  Patient agrees with the plan like to proceed with orthotics -Orthotics were dispensed   Procedure: Injection Tendon/Ligament Location: Bilateral plantar fascia at the glabrous junction; medial approach. Skin Prep: alcohol Injectate: 0.5 cc 0.5% marcaine plain, 0.5 cc of 1% Lidocaine, 0.5 cc kenalog 10. Disposition: Patient tolerated procedure well. Injection site dressed with a band-aid.  No follow-ups on file.

## 2021-12-22 ENCOUNTER — Encounter: Payer: Self-pay | Admitting: Internal Medicine

## 2021-12-22 ENCOUNTER — Telehealth (INDEPENDENT_AMBULATORY_CARE_PROVIDER_SITE_OTHER): Payer: BC Managed Care – PPO | Admitting: Internal Medicine

## 2021-12-22 VITALS — BP 124/86 | Ht 69.0 in | Wt 185.0 lb

## 2021-12-22 DIAGNOSIS — Z125 Encounter for screening for malignant neoplasm of prostate: Secondary | ICD-10-CM

## 2021-12-22 DIAGNOSIS — F1024 Alcohol dependence with alcohol-induced mood disorder: Secondary | ICD-10-CM

## 2021-12-22 DIAGNOSIS — I2510 Atherosclerotic heart disease of native coronary artery without angina pectoris: Secondary | ICD-10-CM

## 2021-12-22 DIAGNOSIS — K219 Gastro-esophageal reflux disease without esophagitis: Secondary | ICD-10-CM

## 2021-12-22 DIAGNOSIS — R748 Abnormal levels of other serum enzymes: Secondary | ICD-10-CM

## 2021-12-22 DIAGNOSIS — K7010 Alcoholic hepatitis without ascites: Secondary | ICD-10-CM

## 2021-12-22 DIAGNOSIS — F1010 Alcohol abuse, uncomplicated: Secondary | ICD-10-CM

## 2021-12-22 DIAGNOSIS — J439 Emphysema, unspecified: Secondary | ICD-10-CM

## 2021-12-22 DIAGNOSIS — E785 Hyperlipidemia, unspecified: Secondary | ICD-10-CM

## 2021-12-22 DIAGNOSIS — J449 Chronic obstructive pulmonary disease, unspecified: Secondary | ICD-10-CM | POA: Diagnosis not present

## 2021-12-22 DIAGNOSIS — F419 Anxiety disorder, unspecified: Secondary | ICD-10-CM

## 2021-12-22 DIAGNOSIS — K5790 Diverticulosis of intestine, part unspecified, without perforation or abscess without bleeding: Secondary | ICD-10-CM

## 2021-12-22 DIAGNOSIS — R0789 Other chest pain: Secondary | ICD-10-CM

## 2021-12-22 DIAGNOSIS — F410 Panic disorder [episodic paroxysmal anxiety] without agoraphobia: Secondary | ICD-10-CM

## 2021-12-22 DIAGNOSIS — K760 Fatty (change of) liver, not elsewhere classified: Secondary | ICD-10-CM

## 2021-12-22 DIAGNOSIS — E876 Hypokalemia: Secondary | ICD-10-CM

## 2021-12-22 DIAGNOSIS — Z1389 Encounter for screening for other disorder: Secondary | ICD-10-CM

## 2021-12-22 DIAGNOSIS — D3502 Benign neoplasm of left adrenal gland: Secondary | ICD-10-CM

## 2021-12-22 DIAGNOSIS — F339 Major depressive disorder, recurrent, unspecified: Secondary | ICD-10-CM

## 2021-12-22 MED ORDER — PANTOPRAZOLE SODIUM 20 MG PO TBEC: 20.0000 mg | DELAYED_RELEASE_TABLET | Freq: Every day | ORAL | 3 refills | Status: DC

## 2021-12-22 MED ORDER — MULTI-VITAMIN PO TABS: 1.0000 | ORAL_TABLET | Freq: Every day | ORAL | 3 refills | Status: DC

## 2021-12-22 MED ORDER — IPRATROPIUM-ALBUTEROL 0.5-2.5 (3) MG/3ML IN SOLN: 3.0000 mL | Freq: Four times a day (QID) | RESPIRATORY_TRACT | 11 refills | Status: DC | PRN

## 2021-12-22 MED ORDER — BUDESONIDE-FORMOTEROL FUMARATE 160-4.5 MCG/ACT IN AERO: 2.0000 | INHALATION_SPRAY | Freq: Two times a day (BID) | RESPIRATORY_TRACT | 11 refills | Status: DC

## 2021-12-22 MED ORDER — POTASSIUM CHLORIDE CRYS ER 20 MEQ PO TBCR: 20.0000 meq | EXTENDED_RELEASE_TABLET | Freq: Two times a day (BID) | ORAL | 3 refills | Status: DC

## 2021-12-22 MED ORDER — LORAZEPAM 0.5 MG PO TABS: 0.2500 mg | ORAL_TABLET | Freq: Every day | ORAL | 2 refills | Status: DC | PRN

## 2021-12-22 MED ORDER — ASPIRIN 81 MG PO CHEW: 81.0000 mg | CHEWABLE_TABLET | Freq: Every day | ORAL | 3 refills | Status: DC

## 2021-12-22 MED ORDER — SERTRALINE HCL 50 MG PO TABS: 50.0000 mg | ORAL_TABLET | Freq: Every day | ORAL | 3 refills | Status: DC

## 2021-12-22 MED ORDER — AMLODIPINE BESYLATE 2.5 MG PO TABS: 2.5000 mg | ORAL_TABLET | Freq: Every day | ORAL | 3 refills | Status: DC

## 2021-12-22 MED ORDER — PRAVASTATIN SODIUM 40 MG PO TABS: 40.0000 mg | ORAL_TABLET | Freq: Every day | ORAL | 3 refills | Status: DC

## 2021-12-22 NOTE — Addendum Note (Signed)
Addended by: Orland Mustard on: 12/22/2021 01:44 PM   Modules accepted: Orders

## 2021-12-22 NOTE — Progress Notes (Addendum)
Telephone Note  I connected with Darrell Santiago  on 12/22/21 at  8:40 AM EDT by atelephone and verified that I am speaking with the correct person using two identifiers.  Location patient: [place] Location provider:work or home office Persons participating in the virtual visit: patient, provider  I discussed the limitations and requested verbal permission for telemedicine visit. The patient expressed understanding and agreed to proceed.   HPI:  Acute telemedicine visit for : 7/27-7/31/23 [facility] for alcohol w/d, atypical chest pain abnormal labs (elevated lfts, low K and mag) doing better needs refill of meds for HTN on norvasc 2.5 only not losartan 50 mg qd  Also out of work due to leg injury until 12/03/21   Imaging with colitis hepatic steatosis, diverticulosis, left adrenal adenoma  He is drinking still 4-5 shots in shot glass qd of liquor   -Pertinent past medical history: see below -Pertinent medication allergies:No Known Allergies -COVID-19 vaccine status:  Immunization History  Administered Date(s) Administered   Hep A / Hep B 05/29/2018, 06/27/2018, 02/07/2019   Influenza,inj,Quad PF,6+ Mos 02/05/2015   Tdap 02/05/2015     ROS: See pertinent positives and negatives per HPI.  Past Medical History:  Diagnosis Date   Abnormal EKG    Allergy    Seasonal   Anxiety    Chicken pox    COVID-19    04/20/21   Depression    GERD (gastroesophageal reflux disease)    Hyperlipidemia    Hypertension    Urine incontinence     Past Surgical History:  Procedure Laterality Date   CHOLECYSTECTOMY  2000   LEFT HEART CATH AND CORONARY ANGIOGRAPHY N/A 01/31/2017   Procedure: LEFT HEART CATH AND CORONARY ANGIOGRAPHY;  Surgeon: Dionisio David, MD;  Location: [facility];  Service: Cardiovascular;  Laterality: N/A;     Current Outpatient Medications:    acetaminophen (TYLENOL) 325 MG tablet, Take 2 tablets (650 mg total) by mouth every 6 (six) hours as needed for mild pain (or  Fever >/= 101)., Disp: , Rfl:    amLODipine (NORVASC) 2.5 MG tablet, Take 1 tablet (2.5 mg total) by mouth daily., Disp: 90 tablet, Rfl: 3   aspirin 81 MG chewable tablet, Chew 1 tablet (81 mg total) by mouth daily., Disp: 90 tablet, Rfl: 3   budesonide-formoterol (SYMBICORT) 160-4.5 MCG/ACT inhaler, Inhale 2 puffs into the lungs 2 (two) times daily. Rinse mouth after use, Disp: 1 each, Rfl: 11   cyclobenzaprine (FLEXERIL) 5 MG tablet, Take 5 mg by mouth 3 (three) times daily as needed., Disp: , Rfl:    folic acid (FOLVITE) 1 MG tablet, Take 1 tablet (1 mg total) by mouth daily. (Patient not taking: Reported on 11/11/2021), Disp: 30 tablet, Rfl: 3   hydrOXYzine (ATARAX/VISTARIL) 25 MG tablet, TAKE 1 TABLET BY MOUTH DAILY AS NEEDED., Disp: 90 tablet, Rfl: 2   ipratropium-albuterol (DUONEB) 0.5-2.5 (3) MG/3ML SOLN, Inhale 3 mLs into the lungs every 6 (six) hours as needed., Disp: 360 mL, Rfl: 11   LORazepam (ATIVAN) 0.5 MG tablet, Take 0.5 tablets (0.25 mg total) by mouth daily as needed for anxiety or sleep., Disp: 30 tablet, Rfl: 2   Multiple Vitamin (MULTI-VITAMIN) tablet, Take 1 tablet by mouth daily., Disp: 90 tablet, Rfl: 3   pantoprazole (PROTONIX) 20 MG tablet, Take 1 tablet (20 mg total) by mouth daily. 30 minutes before food, Disp: 90 tablet, Rfl: 3   potassium chloride SA (KLOR-CON M) 20 MEQ tablet, Take 1 tablet (20 mEq total)  by mouth 2 (two) times daily., Disp: 90 tablet, Rfl: 3   pravastatin (PRAVACHOL) 40 MG tablet, Take 1 tablet (40 mg total) by mouth at bedtime., Disp: 90 tablet, Rfl: 3   sertraline (ZOLOFT) 50 MG tablet, Take 1 tablet (50 mg total) by mouth daily at 12 noon. Note tablet changed from 1/2 of 100 mg to 50 mg qd, Disp: 90 tablet, Rfl: 3   vitamin B-12 (CYANOCOBALAMIN) 1000 MCG tablet, Take 1,000 mcg by mouth daily. , Disp: , Rfl:   EXAM:  VITALS per patient if applicable:  GENERAL: alert, oriented, appears well and in no acute distress  HEENT: atraumatic,  conjunttiva clear, no obvious abnormalities on inspection of external nose and ears  NECK: normal movements of the head and neck  LUNGS: on inspection no signs of respiratory distress, breathing rate appears normal, no obvious gross SOB, gasping or wheezing  CV: no obvious cyanosis  MS: moves all visible extremities without noticeable abnormality  PSYCH/NEURO: pleasant and cooperative, no obvious depression or anxiety, speech and thought processing grossly intact  ASSESSMENT AND PLAN:  Discussed the following assessment and plan:  Alcohol abuse - Plan: Multiple Vitamin (MULTI-VITAMIN) tablet, Ambulatory referral to Gastroenterology  Chronic obstructive pulmonary disease, unspecified COPD type ([place]) - Plan: budesonide-formoterol (SYMBICORT) 160-4.5 MCG/ACT inhaler  Hypokalemia - Plan: potassium chloride SA (KLOR-CON M) 20 MEQ tablet, Comprehensive metabolic panel  Gastroesophageal reflux disease - Plan: pantoprazole (PROTONIX) 20 MG tablet, Ambulatory referral to Gastroenterology  Pulmonary emphysema, unspecified emphysema type ([place]) - Plan: ipratropium-albuterol (DUONEB) 0.5-2.5 (3) MG/3ML SOLN  Hyperlipidemia, unspecified hyperlipidemia type - Plan: pravastatin (PRAVACHOL) 40 MG tablet  Anxiety/panic - Plan: sertraline (ZOLOFT) 50 MG tablet, LORazepam (ATIVAN) 0.25 MG tablet qd prn to taper off  Depression, recurrent ([place]) - Plan: sertraline (ZOLOFT) 50 MG tablet Alcohol dependence with alcohol-induced mood disorder ([place]) - Plan: sertraline (ZOLOFT) 50 MG tablet Rec stop etoh still drinking 4-5 shots qd Rec therapy and  psych again [facility]  Coronary artery disease involving native coronary artery of native heart without angina pectoris - Plan: aspirin 81 MG chewable tablet  Elevated liver enzymes - Plan: Comprehensive metabolic panel, Ambulatory referral to Gastroenterology  Atypical chest pain h/o CAD- Plan: Ambulatory referral to Cardiology  Alcoholic hepatitis without  ascites - Plan: Ambulatory referral to Gastroenterology  Diverticulosis - Plan: Ambulatory referral to Gastroenterology  Hepatic steatosis - Plan: Ambulatory referral to Gastroenterology  Adrenal adenoma, left Benign    HM  Declines flu shot, pna 23, covid vaccine, shingrix  Consider prevnar vaccine Tdap UTD  twinrix 3/3 utd -repeat Hep A/B titer with labs in future Hep C negative     rec smoking cessation pt has cut back  referred colonscopy [place] pt wants outpt  colonoscopy referred [facility]  sch 11/22 consult and 03/23/21 colonoscopy -as of 05/07/21 still needs to do colonoscopy   PSA 0.67 normal 05/22/20 rec healthy diet and exercise    -we discussed possible serious and likely etiologies, options for evaluation and workup, limitations of telemedicine visit vs in person visit, treatment, treatment risks and precautions. Pt is agreeable to treatment via telemedicine at this moment.   I discussed the assessment and treatment plan with the patient. The patient was provided an opportunity to ask questions and all were answered. The patient agreed with the plan and demonstrated an understanding of the instructions.    Time spent 20 minutes Delorise Jackson, MD

## 2021-12-22 NOTE — Patient Instructions (Addendum)
We will cut back on ativan 1/2 0.5 daily   [facility] as given the info before for both  [facility] counseling and psychiatry [facility]  [place] [phone]    [facility]  [address]  [place] [address]  [phone]   Cardiology MD No Physician   Primary Contact Information  Phone Fax E-mail Address  [phone] [email or web address] [place] [address]     Specialties     Cardiology      GI for colonoscopy  MD Physician   Primary Contact Information  Phone Fax E-mail Address  [phone] Not available [place] [address]     Specialties     Gastroenterology

## 2021-12-27 ENCOUNTER — Other Ambulatory Visit: Payer: BC Managed Care – PPO

## 2021-12-30 ENCOUNTER — Telehealth: Payer: Self-pay | Admitting: *Deleted

## 2021-12-30 ENCOUNTER — Other Ambulatory Visit: Payer: Self-pay | Admitting: Internal Medicine

## 2021-12-30 ENCOUNTER — Emergency Department: Payer: BC Managed Care – PPO

## 2021-12-30 ENCOUNTER — Telehealth: Payer: Self-pay | Admitting: Internal Medicine

## 2021-12-30 ENCOUNTER — Other Ambulatory Visit (INDEPENDENT_AMBULATORY_CARE_PROVIDER_SITE_OTHER): Payer: BC Managed Care – PPO

## 2021-12-30 ENCOUNTER — Other Ambulatory Visit: Payer: Self-pay

## 2021-12-30 ENCOUNTER — Observation Stay
Admission: EM | Admit: 2021-12-30 | Discharge: 2021-12-31 | Disposition: A | Discharge status: Home / Self Care | Payer: BC Managed Care – PPO | Attending: Hospitalist | Admitting: Hospitalist

## 2021-12-30 DIAGNOSIS — F32A Depression, unspecified: Secondary | ICD-10-CM

## 2021-12-30 DIAGNOSIS — E876 Hypokalemia: Secondary | ICD-10-CM

## 2021-12-30 DIAGNOSIS — F1010 Alcohol abuse, uncomplicated: Secondary | ICD-10-CM | POA: Diagnosis not present

## 2021-12-30 DIAGNOSIS — E785 Hyperlipidemia, unspecified: Secondary | ICD-10-CM

## 2021-12-30 DIAGNOSIS — J449 Chronic obstructive pulmonary disease, unspecified: Secondary | ICD-10-CM | POA: Diagnosis not present

## 2021-12-30 DIAGNOSIS — R7401 Elevation of levels of liver transaminase levels: Secondary | ICD-10-CM | POA: Diagnosis not present

## 2021-12-30 DIAGNOSIS — R748 Abnormal levels of other serum enzymes: Secondary | ICD-10-CM | POA: Diagnosis not present

## 2021-12-30 DIAGNOSIS — J441 Chronic obstructive pulmonary disease with (acute) exacerbation: Secondary | ICD-10-CM

## 2021-12-30 DIAGNOSIS — Z7982 Long term (current) use of aspirin: Secondary | ICD-10-CM | POA: Diagnosis not present

## 2021-12-30 DIAGNOSIS — Z79899 Other long term (current) drug therapy: Secondary | ICD-10-CM | POA: Insufficient documentation

## 2021-12-30 DIAGNOSIS — Z125 Encounter for screening for malignant neoplasm of prostate: Secondary | ICD-10-CM | POA: Diagnosis not present

## 2021-12-30 DIAGNOSIS — F17210 Nicotine dependence, cigarettes, uncomplicated: Secondary | ICD-10-CM | POA: Insufficient documentation

## 2021-12-30 DIAGNOSIS — I10 Essential (primary) hypertension: Secondary | ICD-10-CM | POA: Insufficient documentation

## 2021-12-30 DIAGNOSIS — R7989 Other specified abnormal findings of blood chemistry: Secondary | ICD-10-CM

## 2021-12-30 DIAGNOSIS — Z8616 Personal history of COVID-19: Secondary | ICD-10-CM | POA: Diagnosis not present

## 2021-12-30 DIAGNOSIS — F39 Unspecified mood [affective] disorder: Secondary | ICD-10-CM

## 2021-12-30 DIAGNOSIS — Z1389 Encounter for screening for other disorder: Secondary | ICD-10-CM

## 2021-12-30 DIAGNOSIS — R799 Abnormal finding of blood chemistry, unspecified: Secondary | ICD-10-CM | POA: Diagnosis present

## 2021-12-30 HISTORY — DX: Chronic obstructive pulmonary disease, unspecified: J44.9

## 2021-12-30 LAB — HEPATIC FUNCTION PANEL
ALT: 80 U/L — ABNORMAL HIGH (ref 0–44)
AST: 200 U/L — ABNORMAL HIGH (ref 15–41)
Albumin: 4.1 g/dL (ref 3.5–5.0)
Alkaline Phosphatase: 140 U/L — ABNORMAL HIGH (ref 38–126)
Bilirubin, Direct: 0.5 mg/dL — ABNORMAL HIGH (ref 0.0–0.2)
Indirect Bilirubin: 0.9 mg/dL (ref 0.3–0.9)
Total Bilirubin: 1.4 mg/dL — ABNORMAL HIGH (ref 0.3–1.2)
Total Protein: 7.2 g/dL (ref 6.5–8.1)

## 2021-12-30 LAB — BASIC METABOLIC PANEL
Anion gap: 17 — ABNORMAL HIGH (ref 5–15)
BUN: 14 mg/dL (ref 6–20)
CO2: 28 mmol/L (ref 22–32)
Calcium: 8.9 mg/dL (ref 8.9–10.3)
Chloride: 97 mmol/L — ABNORMAL LOW (ref 98–111)
Creatinine, Ser: 0.59 mg/dL — ABNORMAL LOW (ref 0.61–1.24)
GFR, Estimated: >60 mL/min (ref >60)
Glucose, Bld: 111 mg/dL — ABNORMAL HIGH (ref 70–99)
Potassium: 2.3 mmol/L — CL (ref 3.5–5.1)
Sodium: 142 mmol/L (ref 135–145)

## 2021-12-30 LAB — COMPREHENSIVE METABOLIC PANEL
ALT: 71 U/L — ABNORMAL HIGH (ref 0–53)
AST: 155 U/L — ABNORMAL HIGH (ref 0–37)
Albumin: 4 g/dL (ref 3.5–5.2)
Alkaline Phosphatase: 149 U/L — ABNORMAL HIGH (ref 39–117)
BUN: 13 mg/dL (ref 6–23)
CO2: 33 mEq/L — ABNORMAL HIGH (ref 19–32)
Calcium: 9.1 mg/dL (ref 8.4–10.5)
Chloride: 98 mEq/L (ref 96–112)
Creatinine, Ser: 0.78 mg/dL (ref 0.40–1.50)
GFR: 101.22 mL/min (ref >60.00)
Glucose, Bld: 93 mg/dL (ref 70–99)
Potassium: 2.4 mEq/L — CL (ref 3.5–5.1)
Sodium: 145 mEq/L (ref 135–145)
Total Bilirubin: 1.3 mg/dL — ABNORMAL HIGH (ref 0.2–1.2)
Total Protein: 6.4 g/dL (ref 6.0–8.3)

## 2021-12-30 LAB — CBC
HCT: 41.4 % (ref 39.0–52.0)
Hemoglobin: 14.4 g/dL (ref 13.0–17.0)
MCH: 35.2 pg — ABNORMAL HIGH (ref 26.0–34.0)
MCHC: 34.8 g/dL (ref 30.0–36.0)
MCV: 101.2 fL — ABNORMAL HIGH (ref 80.0–100.0)
Platelets: 135 10*3/uL — ABNORMAL LOW (ref 150–400)
RBC: 4.09 MIL/uL — ABNORMAL LOW (ref 4.22–5.81)
RDW: 15.5 % (ref 11.5–15.5)
WBC: 6.7 10*3/uL (ref 4.0–10.5)
nRBC: 0 % (ref 0.0–0.2)

## 2021-12-30 LAB — PSA: PSA: 1.93 ng/mL (ref 0.10–4.00)

## 2021-12-30 LAB — POTASSIUM: Potassium: 2.9 mmol/L — ABNORMAL LOW (ref 3.5–5.1)

## 2021-12-30 LAB — TROPONIN I (HIGH SENSITIVITY)
Troponin I (High Sensitivity): 9 ng/L (ref <18)
Troponin I (High Sensitivity): 11 ng/L (ref <18)

## 2021-12-30 LAB — MAGNESIUM
Magnesium: 1.4 mg/dL — ABNORMAL LOW (ref 1.5–2.5)
Magnesium: 1.7 mg/dL (ref 1.7–2.4)

## 2021-12-30 MED ORDER — ADULT MULTIVITAMIN W/MINERALS CH
1.0000 | ORAL_TABLET | Freq: Every day | ORAL | Status: DC
  Administered 2021-12-30 – 2021-12-31 (×2): 1 via ORAL
  Filled 2021-12-30: qty 1

## 2021-12-30 MED ORDER — ACETAMINOPHEN 325 MG PO TABS: 650.0000 mg | ORAL_TABLET | Freq: Four times a day (QID) | ORAL | Status: DC | PRN

## 2021-12-30 MED ORDER — POTASSIUM CHLORIDE IN NACL 20-0.9 MEQ/L-% IV SOLN
INTRAVENOUS | Status: DC
  Filled 2021-12-30 (×4): qty 1000

## 2021-12-30 MED ORDER — ACETAMINOPHEN 650 MG RE SUPP: 650.0000 mg | Freq: Four times a day (QID) | RECTAL | Status: DC | PRN

## 2021-12-30 MED ORDER — POTASSIUM CHLORIDE CRYS ER 20 MEQ PO TBCR
40.0000 meq | EXTENDED_RELEASE_TABLET | Freq: Once | ORAL | Status: AC
  Administered 2021-12-30: 40 meq via ORAL
  Filled 2021-12-30: qty 2

## 2021-12-30 MED ORDER — PRAVASTATIN SODIUM 20 MG PO TABS: 40.0000 mg | ORAL_TABLET | Freq: Every day | ORAL | Status: DC

## 2021-12-30 MED ORDER — LORAZEPAM 0.5 MG PO TABS: 0.2500 mg | ORAL_TABLET | Freq: Every day | ORAL | Status: DC | PRN

## 2021-12-30 MED ORDER — IPRATROPIUM-ALBUTEROL 0.5-2.5 (3) MG/3ML IN SOLN: 3.0000 mL | Freq: Four times a day (QID) | RESPIRATORY_TRACT | Status: DC | PRN

## 2021-12-30 MED ORDER — THIAMINE MONONITRATE 100 MG PO TABS
100.0000 mg | ORAL_TABLET | Freq: Every day | ORAL | Status: DC
  Administered 2021-12-31: 100 mg via ORAL
  Filled 2021-12-30 (×2): qty 1

## 2021-12-30 MED ORDER — ONDANSETRON HCL 4 MG PO TABS: 4.0000 mg | ORAL_TABLET | Freq: Four times a day (QID) | ORAL | Status: DC | PRN

## 2021-12-30 MED ORDER — SERTRALINE HCL 50 MG PO TABS: 50.0000 mg | ORAL_TABLET | Freq: Every day | ORAL | Status: DC

## 2021-12-30 MED ORDER — FOLIC ACID 1 MG PO TABS
1.0000 mg | ORAL_TABLET | Freq: Every day | ORAL | Status: DC
  Administered 2021-12-30 – 2021-12-31 (×2): 1 mg via ORAL
  Filled 2021-12-30 (×2): qty 1

## 2021-12-30 MED ORDER — CYCLOBENZAPRINE HCL 10 MG PO TABS: 5.0000 mg | ORAL_TABLET | Freq: Three times a day (TID) | ORAL | Status: DC | PRN

## 2021-12-30 MED ORDER — POTASSIUM CHLORIDE CRYS ER 20 MEQ PO TBCR
20.0000 meq | EXTENDED_RELEASE_TABLET | Freq: Two times a day (BID) | ORAL | Status: DC
  Administered 2021-12-30 (×2): 20 meq via ORAL
  Filled 2021-12-30 (×3): qty 1

## 2021-12-30 MED ORDER — MOMETASONE FURO-FORMOTEROL FUM 200-5 MCG/ACT IN AERO
2.0000 | INHALATION_SPRAY | Freq: Two times a day (BID) | RESPIRATORY_TRACT | Status: DC
  Administered 2021-12-30 – 2021-12-31 (×2): 2 via RESPIRATORY_TRACT
  Filled 2021-12-30: qty 8.8

## 2021-12-30 MED ORDER — MAGNESIUM SULFATE 2 GM/50ML IV SOLN
2.0000 g | Freq: Once | INTRAVENOUS | Status: AC
  Administered 2021-12-30: 2 g via INTRAVENOUS
  Filled 2021-12-30: qty 50

## 2021-12-30 MED ORDER — LORAZEPAM 1 MG PO TABS: 1.0000 mg | ORAL_TABLET | ORAL | Status: DC | PRN

## 2021-12-30 MED ORDER — POTASSIUM CHLORIDE 10 MEQ/100ML IV SOLN
10.0000 meq | Freq: Once | INTRAVENOUS | Status: AC
  Administered 2021-12-30: 10 meq via INTRAVENOUS
  Filled 2021-12-30: qty 100

## 2021-12-30 MED ORDER — THIAMINE HCL 100 MG/ML IJ SOLN
100.0000 mg | Freq: Every day | INTRAMUSCULAR | Status: DC
  Administered 2021-12-30: 100 mg via INTRAVENOUS
  Filled 2021-12-30: qty 2

## 2021-12-30 MED ORDER — ADULT MULTIVITAMIN W/MINERALS CH
1.0000 | ORAL_TABLET | Freq: Every day | ORAL | Status: DC
  Filled 2021-12-30 (×2): qty 1

## 2021-12-30 MED ORDER — MAGNESIUM HYDROXIDE 400 MG/5ML PO SUSP: 30.0000 mL | Freq: Every day | ORAL | Status: DC | PRN

## 2021-12-30 MED ORDER — ONDANSETRON HCL 4 MG/2ML IJ SOLN
4.0000 mg | Freq: Four times a day (QID) | INTRAMUSCULAR | Status: DC | PRN
  Administered 2021-12-30: 4 mg via INTRAVENOUS
  Filled 2021-12-30: qty 2

## 2021-12-30 MED ORDER — ASPIRIN 81 MG PO CHEW
81.0000 mg | CHEWABLE_TABLET | Freq: Every day | ORAL | Status: DC
  Administered 2021-12-31: 81 mg via ORAL
  Filled 2021-12-30: qty 1

## 2021-12-30 MED ORDER — TRAZODONE HCL 50 MG PO TABS: 25.0000 mg | ORAL_TABLET | Freq: Every evening | ORAL | Status: DC | PRN

## 2021-12-30 MED ORDER — HYDROXYZINE HCL 25 MG PO TABS: 25.0000 mg | ORAL_TABLET | Freq: Every day | ORAL | Status: DC | PRN

## 2021-12-30 MED ORDER — NICOTINE 21 MG/24HR TD PT24
21.0000 mg | MEDICATED_PATCH | Freq: Once | TRANSDERMAL | Status: DC
  Administered 2021-12-30: 21 mg via TRANSDERMAL
  Filled 2021-12-30: qty 1

## 2021-12-30 MED ORDER — AMLODIPINE BESYLATE 5 MG PO TABS
2.5000 mg | ORAL_TABLET | Freq: Every day | ORAL | Status: DC
  Administered 2021-12-31: 2.5 mg via ORAL
  Filled 2021-12-30: qty 1

## 2021-12-30 MED ORDER — METOPROLOL TARTRATE 5 MG/5ML IV SOLN
5.0000 mg | Freq: Once | INTRAVENOUS | Status: AC
  Administered 2021-12-30: 5 mg via INTRAVENOUS
  Filled 2021-12-30: qty 5

## 2021-12-30 MED ORDER — MAGNESIUM OXIDE 400 MG PO TABS: 400.0000 mg | ORAL_TABLET | Freq: Every day | ORAL | 0 refills | Status: DC

## 2021-12-30 MED ORDER — MAGNESIUM OXIDE 400 MG PO TABS
400.0000 mg | ORAL_TABLET | Freq: Every day | ORAL | Status: DC
  Administered 2021-12-31: 400 mg via ORAL
  Filled 2021-12-30 (×2): qty 1

## 2021-12-30 MED ORDER — ENOXAPARIN SODIUM 40 MG/0.4ML IJ SOSY
40.0000 mg | PREFILLED_SYRINGE | INTRAMUSCULAR | Status: DC
  Administered 2021-12-30: 40 mg via SUBCUTANEOUS
  Filled 2021-12-30: qty 0.4

## 2021-12-30 MED ORDER — LORAZEPAM 2 MG/ML IJ SOLN
1.0000 mg | INTRAMUSCULAR | Status: DC | PRN
  Administered 2021-12-30: 2 mg via INTRAVENOUS
  Filled 2021-12-30: qty 1

## 2021-12-30 MED ORDER — PANTOPRAZOLE SODIUM 20 MG PO TBEC
20.0000 mg | DELAYED_RELEASE_TABLET | Freq: Every day | ORAL | Status: DC
  Administered 2021-12-31: 20 mg via ORAL
  Filled 2021-12-30: qty 1

## 2021-12-30 MED FILL — Multiple Vitamins w/ Minerals Tab: ORAL | Qty: 1 | Status: AC

## 2021-12-30 NOTE — Assessment & Plan Note (Signed)
-   We will continue his antihypertensives. 

## 2021-12-30 NOTE — Assessment & Plan Note (Signed)
-   We will continue statin therapy. 

## 2021-12-30 NOTE — ED Triage Notes (Signed)
Pt was sent here by his Dr d/t having a potassium level of 2.4- pt had his blood drawn this AM at the office- pt has also been more tired than normal today- pt has been on potassium pills for the last 3 days- pt O2 levels were low at 85% and pt was placed on 2L [place]

## 2021-12-30 NOTE — Assessment & Plan Note (Signed)
-   The patient be admitted to a medical telemetry observation bed. - We will continue potassium replacement with p.o. and IV calcium chloride. - We will check magnesium level.

## 2021-12-30 NOTE — Assessment & Plan Note (Addendum)
-   The patient be placed on CIWA protocol for alcohol withdrawal. - She was counseled for cessation.

## 2021-12-30 NOTE — Assessment & Plan Note (Signed)
-   This is likely secondary to alcohol abuse and may be mild alcoholic hepatitis. - We will follow LFTs with hydration.

## 2021-12-30 NOTE — ED Provider Notes (Signed)
[facility] Provider Note    Event Date/Time   First MD Initiated Contact with Patient 12/30/21 1337     (approximate)   History   Abnormal Lab   HPI  Darrell Santiago is a 56 y.o. male with history of COPD, anxiety, hypertension presents emergency department stating his doctor called to tell him he needed to come emergency department as his potassium was 2.4.  Patient states that he feels fine.  He would like to go home.  Denies chest pain, shortness of breath.  Has been dealing with a knee injury from 2 weeks ago.  Would like to just go home and go back to work.  Has a history of hypokalemia EtOH abuse      Physical Exam   Triage Vital Signs: ED Triage Vitals [12/30/21 1229]  Enc Vitals Group     BP 96/73     Pulse Rate 81     Resp 20     Temp 98.4 F (36.9 C)     Temp Source Oral     SpO2 (!) 85 %     Weight 165 lb (74.8 kg)     Height '5\' 9"'$  (1.753 m)     Head Circumference      Peak Flow      Pain Score 0     Pain Loc      Pain Edu?      Excl. in [place]?     Most recent vital signs: Vitals:   12/30/21 1330 12/30/21 1400  BP: 109/76 108/78  Pulse: 72 78  Resp: 18 17  Temp:    SpO2: 95% 98%     General: Awake, no distress.   CV:  Good peripheral perfusion. regular rate and  rhythm Resp:  Normal effort. Lungs CTA Abd:  No distention.   Other:      ED Results / Procedures / Treatments   Labs (all labs ordered are listed, but only abnormal results are displayed) Labs Reviewed  BASIC METABOLIC PANEL - Abnormal; Notable for the following components:      Result Value   Potassium 2.3 (*)    Chloride 97 (*)    Glucose, Bld 111 (*)    Creatinine, Ser 0.59 (*)    Anion gap 17 (*)    All other components within normal limits  CBC - Abnormal; Notable for the following components:   RBC 4.09 (*)    MCV 101.2 (*)    MCH 35.2 (*)    Platelets 135 (*)    All other components within normal limits  HEPATIC FUNCTION PANEL - Abnormal;  Notable for the following components:   AST 200 (*)    ALT 80 (*)    Alkaline Phosphatase 140 (*)    Total Bilirubin 1.4 (*)    Bilirubin, Direct 0.5 (*)    All other components within normal limits  MAGNESIUM  URINALYSIS, ROUTINE W REFLEX MICROSCOPIC  TROPONIN I (HIGH SENSITIVITY)     EKG  EKG   RADIOLOGY Chest x-ray    PROCEDURES:   .Critical Care  Performed by: Versie Starks, PA-C Authorized by: Versie Starks, PA-C   Critical care provider statement:    Critical care time (minutes):  45   Critical care time was exclusive of:  Separately billable procedures and treating other patients   Critical care was necessary to treat or prevent imminent or life-threatening deterioration of the following conditions:  Metabolic crisis   Critical care  was time spent personally by me on the following activities:  Blood draw for specimens, development of treatment plan with patient or surrogate, evaluation of patient's response to treatment, examination of patient, obtaining history from patient or surrogate, ordering and performing treatments and interventions, ordering and review of laboratory studies, ordering and review of radiographic studies and review of old charts   Care discussed with: admitting provider      MEDICATIONS ORDERED IN ED: Medications  potassium chloride 10 mEq in 100 mL IVPB (has no administration in time range)  potassium chloride SA (KLOR-CON M) CR tablet 40 mEq (has no administration in time range)  nicotine (NICODERM CQ - dosed in mg/24 hours) patch 21 mg (has no administration in time range)  LORazepam (ATIVAN) tablet 1-4 mg (has no administration in time range)    Or  LORazepam (ATIVAN) injection 1-4 mg (has no administration in time range)  thiamine (VITAMIN B1) tablet 100 mg (has no administration in time range)    Or  thiamine (VITAMIN B1) injection 100 mg (has no administration in time range)  folic acid (FOLVITE) tablet 1 mg (has no  administration in time range)  multivitamin with minerals tablet 1 tablet (has no administration in time range)     IMPRESSION / MDM / ASSESSMENT AND PLAN / ED COURSE  I reviewed the triage vital signs and the nursing notes.                              Differential diagnosis includes, but is not limited to, hypokalemia, cardiac arrhythmia, COPD exasperation, lab error  Patient's presentation is most consistent with acute presentation with potential threat to life or bodily function.   Chest x-ray independently reviewed and interpreted by me as being normal   EKG shows sinus rhythm with right bundle branch block, see physician read  Patient's basic metabolic panel is concerning for critical value for hypokalemia.  Potassium is 2.3.  Patient also has elevated liver enzymes which are most likely due to his EtOH abuse, magnesium and troponin are reassuring, CBC is reassuring  Consult to hospitalist for admission, patient was started on IV potassium along with 40 mEq p.o.  Spoke with Dr.Mansy about admission for the patient.  He understands that he has hypokalemia and EtOH abuse.  Patient is in stable condition at this time.  FINAL CLINICAL IMPRESSION(S) / ED DIAGNOSES   Final diagnoses:  Hypokalemia  Elevated liver enzymes     Rx / DC Orders   ED Discharge Orders     None        Note:  This document was prepared using Dragon voice recognition software and may include unintentional dictation errors.    Versie Starks, PA-C 12/30/21 1457    Naaman Plummer, MD 12/30/21 [phone]

## 2021-12-30 NOTE — Telephone Encounter (Signed)
Pt needs to go to the hospital potassium critically low needs IV potassium asap  Sent oral pills to pharmacy on 12/22/21 to take 2x per day Mail high potassium food list    Dr. Olivia Mackie McLean-Scocuzza

## 2021-12-30 NOTE — Telephone Encounter (Signed)
See telephone note.

## 2021-12-30 NOTE — Telephone Encounter (Signed)
CRITICAL VALUE STICKER  CRITICAL VALUE: Potassium-2.4 (L)  RECEIVER (on-site recipient of call):Jari Favre, CMA/XT  DATE & TIME NOTIFIED: 12/30/21 @ 10:43am  MESSENGER (representative from lab): [place]  MD NOTIFIED: Dr/ [place]  TIME OF NOTIFICATION: 10:45am  RESPONSE:

## 2021-12-30 NOTE — Assessment & Plan Note (Signed)
-   We will continue his inhalers and his home O2.

## 2021-12-30 NOTE — Assessment & Plan Note (Signed)
-   We will continue Zoloft 

## 2021-12-30 NOTE — H&P (Signed)
[place]   PATIENT NAME: Darrell Santiago    MR#:  [id number]  DATE OF BIRTH:  Jan 02, 1966  DATE OF ADMISSION:  12/30/2021  PRIMARY CARE PHYSICIAN: McLean-Scocuzza, Nino Glow, MD   Patient is coming from: Home  REQUESTING/REFERRING PHYSICIAN: Versie Starks, PA-C  CHIEF COMPLAINT:   Chief Complaint  Patient presents with   Abnormal Lab    HISTORY OF PRESENT ILLNESS:  TERREN Santiago is a 56 y.o. Caucasian male with medical history significant for anxiety and depression, GERD, hypertension, EtOH abuse and dyslipidemia, presented to the emergency room with an abnormal blood work that his PCP advised him to come to the ER for a low potassium of 2.4.  He denies any chest pain or palpitations.  No nausea or vomiting or diarrhea.  No fever or chills.  He had a knee injury about 2 weeks ago.  No cough or wheezing or dyspnea.  No dysuria, oliguria or hematuria or flank pain.  He usually drinks few shots per night per his report.  His last alcoholic drink was 5 hours ago.  ED Course: When he came to the ER, vital signs were remarkable for a pulse oximetry of 85% on room air and 94% on his baseline 2 L O2 by nasal cannula and a blood pressure of 96/73. Labs revealed macrocytosis on CBC and thrombocytopenia of 135 above previous levels and BMP was remarkable for hypokalemia of 2.3 with anion gap of 17.  High sensitive troponin I of was 9.  LFTs showed elevated alk phos of 140 and AST of 200 with ALT of 80 slightly above previous levels on earlier this morning and previously on 11/15/2021.  EKG as reviewed by me : EKG showed normal sinus rhythm with a rate of 70 with right bundle branch block and left intrafascicular block.  Imaging: Portable chest x-ray showed no acute cardiopulmonary disease.  The patient was placed on CIWA protocol and was given 38 M EQ p.o. potassium chloride and ordered 20 mEq twice daily.  The patient will be placed on a medical telemetry observation bed for further  evaluation and management. PAST MEDICAL HISTORY:   Past Medical History:  Diagnosis Date   Abnormal EKG    Allergy    Seasonal   Anxiety    Chicken pox    COPD (chronic obstructive pulmonary disease) ([place])    COVID-19    04/20/21   Depression    GERD (gastroesophageal reflux disease)    Hyperlipidemia    Hypertension    Urine incontinence     PAST SURGICAL HISTORY:   Past Surgical History:  Procedure Laterality Date   CHOLECYSTECTOMY  2000   LEFT HEART CATH AND CORONARY ANGIOGRAPHY N/A 01/31/2017   Procedure: LEFT HEART CATH AND CORONARY ANGIOGRAPHY;  Surgeon: Dionisio David, MD;  Location: [facility];  Service: Cardiovascular;  Laterality: N/A;    SOCIAL HISTORY:   Social History   Tobacco Use   Smoking status: Every Day    Packs/day: 1.00    Years: 26.00    Total pack years: 26.00    Types: Cigarettes   Smokeless tobacco: Never   Tobacco comments:    little less than one ppd  Substance Use Topics   Alcohol use: Yes    Alcohol/week: 21.0 standard drinks of alcohol    Types: 21 Shots of liquor per week    Comment: 1 pt will last 1 week     FAMILY HISTORY:  Family History  Problem Relation Age of Onset   Alcohol abuse Father    Diabetes Father    Cancer Father        ?stage IV thyroid with met   Cancer Maternal Grandfather        Colon Cancer    DRUG ALLERGIES:  No Known Allergies  REVIEW OF SYSTEMS:   ROS As per history of present illness. All pertinent systems were reviewed above. Constitutional, HEENT, cardiovascular, respiratory, GI, GU, musculoskeletal, neuro, psychiatric, endocrine, integumentary and hematologic systems were reviewed and are otherwise negative/unremarkable except for positive findings mentioned above in the HPI.   MEDICATIONS AT HOME:   Prior to Admission medications   Medication Sig Start Date End Date Taking? Authorizing Provider  acetaminophen (TYLENOL) 325 MG tablet Take 2 tablets (650 mg total) by mouth  every 6 (six) hours as needed for mild pain (or Fever >/= 101). 07/26/18   Gladstone Lighter, MD  amLODipine (NORVASC) 2.5 MG tablet Take 1 tablet (2.5 mg total) by mouth daily. 12/22/21   McLean-Scocuzza, Nino Glow, MD  aspirin 81 MG chewable tablet Chew 1 tablet (81 mg total) by mouth daily. 12/22/21   McLean-Scocuzza, Nino Glow, MD  budesonide-formoterol (SYMBICORT) 160-4.5 MCG/ACT inhaler Inhale 2 puffs into the lungs 2 (two) times daily. Rinse mouth after use 12/22/21   McLean-Scocuzza, Nino Glow, MD  cyclobenzaprine (FLEXERIL) 5 MG tablet Take 5 mg by mouth 3 (three) times daily as needed. 10/25/21   [provider]  folic acid (FOLVITE) 1 MG tablet Take 1 tablet (1 mg total) by mouth daily. Patient not taking: Reported on 11/11/2021 05/01/21   Regalado, Jerald Kief A, MD  hydrOXYzine (ATARAX/VISTARIL) 25 MG tablet TAKE 1 TABLET BY MOUTH DAILY AS NEEDED. 03/10/21   McLean-Scocuzza, Nino Glow, MD  ipratropium-albuterol (DUONEB) 0.5-2.5 (3) MG/3ML SOLN Inhale 3 mLs into the lungs every 6 (six) hours as needed. 12/22/21   McLean-Scocuzza, Nino Glow, MD  LORazepam (ATIVAN) 0.5 MG tablet Take 0.5 tablets (0.25 mg total) by mouth daily as needed for anxiety or sleep. 12/22/21   McLean-Scocuzza, Nino Glow, MD  magnesium oxide (MAG-OX) 400 MG tablet Take 1 tablet (400 mg total) by mouth daily. 12/30/21   McLean-Scocuzza, Nino Glow, MD  Multiple Vitamin (MULTI-VITAMIN) tablet Take 1 tablet by mouth daily. 12/22/21   McLean-Scocuzza, Nino Glow, MD  pantoprazole (PROTONIX) 20 MG tablet Take 1 tablet (20 mg total) by mouth daily. 30 minutes before food 12/22/21   McLean-Scocuzza, Nino Glow, MD  potassium chloride SA (KLOR-CON M) 20 MEQ tablet Take 1 tablet (20 mEq total) by mouth 2 (two) times daily. 12/22/21   McLean-Scocuzza, Nino Glow, MD  pravastatin (PRAVACHOL) 40 MG tablet Take 1 tablet (40 mg total) by mouth at bedtime. 12/22/21   McLean-Scocuzza, Nino Glow, MD  sertraline (ZOLOFT) 50 MG tablet Take 1 tablet (50 mg total) by mouth daily at  12 noon. Note tablet changed from 1/2 of 100 mg to 50 mg qd 12/22/21   McLean-Scocuzza, Nino Glow, MD  vitamin B-12 (CYANOCOBALAMIN) 1000 MCG tablet Take 1,000 mcg by mouth daily.     [provider]      VITAL SIGNS:  Blood pressure 108/78, pulse 78, temperature 98.4 F (36.9 C), temperature source Oral, resp. rate 17, height 5' 9" (1.753 m), weight 74.8 kg, SpO2 98 %.  PHYSICAL EXAMINATION:  Physical Exam  GENERAL:  56 y.o.-year-old Caucasian male patient lying in the bed with no acute distress.  EYES: Pupils equal, round, reactive to  light and accommodation. No scleral icterus. Extraocular muscles intact.  HEENT: Head atraumatic, normocephalic. Oropharynx and nasopharynx clear.  NECK:  Supple, no jugular venous distention. No thyroid enlargement, no tenderness.  LUNGS: Normal breath sounds bilaterally, no wheezing, rales,rhonchi or crepitation. No use of accessory muscles of respiration.  CARDIOVASCULAR: Regular rate and rhythm, S1, S2 normal. No murmurs, rubs, or gallops.  ABDOMEN: Soft, nondistended, nontender. Bowel sounds present. No organomegaly or mass.  EXTREMITIES: No pedal edema, cyanosis, or clubbing.  NEUROLOGIC: Cranial nerves II through XII are intact. Muscle strength 5/5 in all extremities. Sensation intact. Gait not checked.  PSYCHIATRIC: The patient is alert and oriented x 3.  Normal affect and good eye contact. SKIN: No obvious rash, lesion, or ulcer.   LABORATORY PANEL:   CBC Recent Labs  Lab 12/30/21 1234  WBC 6.7  HGB 14.4  HCT 41.4  PLT 135*   ------------------------------------------------------------------------------------------------------------------  Chemistries  Recent Labs  Lab 12/30/21 1234 12/30/21 1323  NA 142  --   K 2.3*  --   CL 97*  --   CO2 28  --   GLUCOSE 111*  --   BUN 14  --   CREATININE 0.59*  --   CALCIUM 8.9  --   MG  --  1.7  AST  --  200*  ALT  --  80*  ALKPHOS  --  140*  BILITOT  --  1.4*    ------------------------------------------------------------------------------------------------------------------  Cardiac Enzymes No results for input(s): "TROPONINI" in the last 168 hours. ------------------------------------------------------------------------------------------------------------------  RADIOLOGY:  DG Chest Portable 1 View  Result Date: 12/30/2021 CLINICAL DATA:  Hypoxia. EXAM: PORTABLE CHEST 1 VIEW COMPARISON:  November 11, 2021 FINDINGS: The heart size and mediastinal contours are within normal limits. Both lungs are clear. The visualized skeletal structures are unremarkable. IMPRESSION: No active disease. Electronically Signed   By: Fidela Salisbury M.D.   On: 12/30/2021 13:44      IMPRESSION AND PLAN:  Assessment and Plan: * Hypokalemia - The patient be admitted to a medical telemetry observation bed. - We will continue potassium replacement with p.o. and IV calcium chloride. - We will check magnesium level.  Alcohol abuse - The patient be placed on CIWA protocol for alcohol withdrawal. - She was counseled for cessation.  Elevated LFTs - This is likely secondary to alcohol abuse and may be mild alcoholic hepatitis. - We will follow LFTs with hydration.  Depression - We will continue Zoloft  Dyslipidemia - We will continue statin therapy.  Essential hypertension - We will continue his antihypertensives  Chronic obstructive pulmonary disease (COPD) ([place]) - We will continue his inhalers and his home O2.    DVT prophylaxis: Lovenox.  Advanced Care Planning:  Code Status: full code.  Family Communication:  The plan of care was discussed in details with the patient (and family). I answered all questions. The patient agreed to proceed with the above mentioned plan. Further management will depend upon hospital course. Disposition Plan: Back to previous home environment Consults called: none.  All the records are reviewed and case discussed with ED  provider.  Status is: Observation   I certify that at the time of admission, it is my clinical judgment that the patient will require hospital care extending less than 2 midnights.                            Dispo: The patient is from: Home  Anticipated d/c is to: Home              Patient currently is not medically stable to d/c.              Difficult to place patient: No  Christel Mormon M.D on 12/30/2021 at 4:07 PM  [facility]   From 7 PM-7 AM, contact night-coverage [email or web address]  CC: Primary care physician; McLean-Scocuzza, Nino Glow, MD

## 2021-12-31 DIAGNOSIS — E876 Hypokalemia: Secondary | ICD-10-CM | POA: Diagnosis not present

## 2021-12-31 LAB — BASIC METABOLIC PANEL
Anion gap: 8 (ref 5–15)
BUN: 13 mg/dL (ref 6–20)
CO2: 29 mmol/L (ref 22–32)
Calcium: 8.5 mg/dL — ABNORMAL LOW (ref 8.9–10.3)
Chloride: 104 mmol/L (ref 98–111)
Creatinine, Ser: 0.51 mg/dL — ABNORMAL LOW (ref 0.61–1.24)
GFR, Estimated: >60 mL/min (ref >60)
Glucose, Bld: 94 mg/dL (ref 70–99)
Potassium: 3.6 mmol/L (ref 3.5–5.1)
Sodium: 141 mmol/L (ref 135–145)

## 2021-12-31 LAB — URINALYSIS, ROUTINE W REFLEX MICROSCOPIC
Bacteria, UA: NONE SEEN /HPF
Bilirubin Urine: NEGATIVE
Glucose, UA: NEGATIVE
Hgb urine dipstick: NEGATIVE
Hyaline Cast: NONE SEEN /LPF
Ketones, ur: NEGATIVE
Nitrite: NEGATIVE
RBC / HPF: NONE SEEN /HPF (ref 0–2)
Specific Gravity, Urine: 1.012 (ref 1.001–1.035)
Squamous Epithelial / HPF: NONE SEEN /HPF (ref <=5)
pH: 6.5 (ref 5.0–8.0)

## 2021-12-31 LAB — CBC
HCT: 37 % — ABNORMAL LOW (ref 39.0–52.0)
Hemoglobin: 12.6 g/dL — ABNORMAL LOW (ref 13.0–17.0)
MCH: 35.1 pg — ABNORMAL HIGH (ref 26.0–34.0)
MCHC: 34.1 g/dL (ref 30.0–36.0)
MCV: 103.1 fL — ABNORMAL HIGH (ref 80.0–100.0)
Platelets: 115 10*3/uL — ABNORMAL LOW (ref 150–400)
RBC: 3.59 MIL/uL — ABNORMAL LOW (ref 4.22–5.81)
RDW: 15.4 % (ref 11.5–15.5)
WBC: 6.1 10*3/uL (ref 4.0–10.5)
nRBC: 0 % (ref 0.0–0.2)

## 2021-12-31 LAB — PHOSPHORUS: Phosphorus: 2.4 mg/dL — ABNORMAL LOW (ref 2.5–4.6)

## 2021-12-31 LAB — MAGNESIUM: Magnesium: 1.8 mg/dL (ref 1.7–2.4)

## 2021-12-31 MED ORDER — POTASSIUM PHOSPHATES 15 MMOLE/5ML IV SOLN
30.0000 mmol | Freq: Once | INTRAVENOUS | Status: DC
  Filled 2021-12-31: qty 10

## 2021-12-31 MED ORDER — POTASSIUM CHLORIDE CRYS ER 20 MEQ PO TBCR
40.0000 meq | EXTENDED_RELEASE_TABLET | Freq: Once | ORAL | Status: AC
  Administered 2021-12-31: 40 meq via ORAL
  Filled 2021-12-31: qty 2

## 2021-12-31 MED ORDER — POTASSIUM & SODIUM PHOSPHATES 280-160-250 MG PO PACK
2.0000 | PACK | Freq: Three times a day (TID) | ORAL | Status: AC
  Administered 2021-12-31: 2 via ORAL
  Filled 2021-12-31 (×2): qty 2

## 2021-12-31 MED ORDER — POTASSIUM CHLORIDE 10 MEQ/100ML IV SOLN
10.0000 meq | INTRAVENOUS | Status: AC
  Administered 2021-12-31 (×4): 10 meq via INTRAVENOUS
  Filled 2021-12-31: qty 100

## 2021-12-31 MED ORDER — VITAMIN B-1 100 MG PO TABS: 100.0000 mg | ORAL_TABLET | Freq: Every day | ORAL | Status: DC

## 2021-12-31 NOTE — Progress Notes (Signed)
Cross Cover  Patient with asymptomatic tachycardia 160-170 with initial CIWA score of 2 per RN Rate improved with 5 metoprolol and and 2 of ativan per CIWA for reassessed score of 11.  EKG shows SR with RBBBB and LAFB that are no new for him Additional 2 gm Mag Iv and repeat potassium level ordered Kathlene Cote NP [facility]

## 2021-12-31 NOTE — Discharge Summary (Incomplete)
Physician Discharge Summary   Darrell Santiago  male DOB: April 01, 1966  Darrell Santiago:[id number]  PCP: McLean-Scocuzza, Nino Glow, MD  Admit date: 12/30/2021 Discharge date: 12/31/2021  Admitted From: home Disposition:  home CODE STATUS: Full code  Discharge Instructions     Diet general   Complete by: As directed       Hospital Course:  For full details, please see H&P, progress notes, consult notes and ancillary notes.  Briefly,  Darrell Santiago is a 56 y.o. Caucasian male with medical history significant for anxiety and depression, hypertension, EtOH abuse, presented to the emergency room with an abnormal blood work that his PCP advised him to come to the ER for a low potassium of 2.4.    No nausea or vomiting or diarrhea. He usually drinks few shots per night per his report.  His last alcoholic drink was 5 hours ago.  * Hypokalemia, acute on chronic --pt has chronic hypokalemia, at least dating back to 2016.  Pt is supposed to be on potassium supplement 20 mEq BID at home, but unclear compliance.   --potassium supplementation given, and potassium level was 3.6 prior to discharge.  Pt was discharged back on home potassium supplement and advised to follow up with PCP for level checks.  Hypomag --Pt is on oral mag supplement PTA.  Continue home mag supplement.  Hypophos --repleted.   Alcohol abuse --likely contributing to his low K, Mag and phos.  Pt advised to quit or reduce alcohol consumption.   Elevated LFTs - This is likely secondary to alcohol abuse.   Depression and anxiety - continue Zoloft and home Ativan   Dyslipidemia - continue statin therapy.   Essential hypertension - cont amlodipine   Chronic obstructive pulmonary disease (COPD) ([place]) - cont home bronchodilator regimen (see below)   Unless noted above, medications under "STOP" list are ones pt was not taking PTA.  Discharge Diagnoses:  Principal Problem:   Hypokalemia Active Problems:   Alcohol abuse    Elevated LFTs   Chronic obstructive pulmonary disease (COPD) ([place])   Essential hypertension   Dyslipidemia   Depression   30 Day Unplanned Readmission Risk Score    Flowsheet Row ED to Hosp-Admission (Discharged) from 11/11/2021 in [facility]  30 Day Unplanned Readmission Risk Score (%) 11.08 Filed at 11/15/2021 1600       This score is the patient's risk of an unplanned readmission within 30 days of being discharged (0 -100%). The score is based on dignosis, age, lab data, medications, orders, and past utilization.   Low:  0-14.9   Medium: 15-21.9   High: 22-29.9   Extreme: 30 and above         Discharge Instructions:  Allergies as of 12/31/2021   No Known Allergies      Medication List     STOP taking these medications    cyclobenzaprine 5 MG tablet Commonly known as: FLEXERIL       TAKE these medications    acetaminophen 325 MG tablet Commonly known as: TYLENOL Take 2 tablets (650 mg total) by mouth every 6 (six) hours as needed for mild pain (or Fever >/= 101).   amLODipine 2.5 MG tablet Commonly known as: NORVASC Take 1 tablet (2.5 mg total) by mouth daily.   aspirin 81 MG chewable tablet Chew 1 tablet (81 mg total) by mouth daily.   budesonide-formoterol 160-4.5 MCG/ACT inhaler Commonly known as: Symbicort Inhale 2 puffs into the lungs 2 (two) times daily.  Rinse mouth after use   cyanocobalamin 1000 MCG tablet Commonly known as: VITAMIN B12 Take 1,000 mcg by mouth daily.   folic acid 1 MG tablet Commonly known as: FOLVITE Take 1 tablet (1 mg total) by mouth daily.   hydrOXYzine 25 MG tablet Commonly known as: ATARAX TAKE 1 TABLET BY MOUTH DAILY AS NEEDED.   ipratropium-albuterol 0.5-2.5 (3) MG/3ML Soln Commonly known as: DUONEB Inhale 3 mLs into the lungs every 6 (six) hours as needed.   LORazepam 0.5 MG tablet Commonly known as: ATIVAN Take 0.5 tablets (0.25 mg total) by mouth daily as needed for anxiety or  sleep.   magnesium oxide 400 MG tablet Commonly known as: MAG-OX Take 1 tablet (400 mg total) by mouth daily.   Multi-Vitamin tablet Take 1 tablet by mouth daily.   pantoprazole 20 MG tablet Commonly known as: PROTONIX Take 1 tablet (20 mg total) by mouth daily. 30 minutes before food   potassium chloride SA 20 MEQ tablet Commonly known as: KLOR-CON M Take 1 tablet (20 mEq total) by mouth 2 (two) times daily.   pravastatin 40 MG tablet Commonly known as: PRAVACHOL Take 1 tablet (40 mg total) by mouth at bedtime.   sertraline 50 MG tablet Commonly known as: ZOLOFT Take 1 tablet (50 mg total) by mouth daily at 12 noon. Note tablet changed from 1/2 of 100 mg to 50 mg qd What changed: additional instructions   thiamine 100 MG tablet Commonly known as: Vitamin B-1 Take 1 tablet (100 mg total) by mouth daily. Start taking on: January 01, 2022         Follow-up Information     McLean-Scocuzza, Nino Glow, MD Follow up in 1 week(s).   Specialty: Internal Medicine Contact information: [place] [id number] [phone]                 No Known Allergies   The results of significant diagnostics from this hospitalization (including imaging, microbiology, ancillary and laboratory) are listed below for reference.   Consultations:   Procedures/Studies: DG Chest Portable 1 View  Result Date: 12/30/2021 CLINICAL DATA:  Hypoxia. EXAM: PORTABLE CHEST 1 VIEW COMPARISON:  November 11, 2021 FINDINGS: The heart size and mediastinal contours are within normal limits. Both lungs are clear. The visualized skeletal structures are unremarkable. IMPRESSION: No active disease. Electronically Signed   By: Fidela Salisbury M.D.   On: 12/30/2021 13:44      Labs: BNP (last 3 results) Recent Labs    04/26/21 1933  BNP 33.8   Basic Metabolic Panel: Recent Labs  Lab 12/30/21 0735 12/30/21 1234 12/30/21 1323 12/30/21 2319 12/31/21 0451  NA 145 142  --    --  141  K 2.4* 2.3*  --  2.9* 3.6  CL 98 97*  --   --  104  CO2 33* 28  --   --  29  GLUCOSE 93 111*  --   --  94  BUN 13 14  --   --  13  CREATININE 0.78 0.59*  --   --  0.51*  CALCIUM 9.1 8.9  --   --  8.5*  MG 1.4*  --  1.7  --  1.8  PHOS  --   --   --  2.4*  --    Liver Function Tests: Recent Labs  Lab 12/30/21 0735 12/30/21 1323  AST 155* 200*  ALT 71* 80*  ALKPHOS 149* 140*  BILITOT 1.3* 1.4*  PROT 6.4 7.2  ALBUMIN 4.0 4.1   No results for input(s): "LIPASE", "AMYLASE" in the last 168 hours. No results for input(s): "AMMONIA" in the last 168 hours. CBC: Recent Labs  Lab 12/30/21 1234 12/31/21 0451  WBC 6.7 6.1  HGB 14.4 12.6*  HCT 41.4 37.0*  MCV 101.2* 103.1*  PLT 135* 115*   Cardiac Enzymes: No results for input(s): "CKTOTAL", "CKMB", "CKMBINDEX", "TROPONINI" in the last 168 hours. BNP: Invalid input(s): "POCBNP" CBG: No results for input(s): "GLUCAP" in the last 168 hours. D-Dimer No results for input(s): "DDIMER" in the last 72 hours. Hgb A1c No results for input(s): "HGBA1C" in the last 72 hours. Lipid Profile No results for input(s): "CHOL", "HDL", "LDLCALC", "TRIG", "CHOLHDL", "LDLDIRECT" in the last 72 hours. Thyroid function studies No results for input(s): "TSH", "T4TOTAL", "T3FREE", "THYROIDAB" in the last 72 hours.  Invalid input(s): "FREET3" Anemia work up No results for input(s): "VITAMINB12", "FOLATE", "FERRITIN", "TIBC", "IRON", "RETICCTPCT" in the last 72 hours. Urinalysis    Component Value Date/Time   COLORURINE DARK YELLOW 12/30/2021 0735   APPEARANCEUR CLEAR 12/30/2021 0735   LABSPEC 1.012 12/30/2021 0735   PHURINE 6.5 12/30/2021 0735   GLUCOSEU NEGATIVE 12/30/2021 0735   GLUCOSEU NEGATIVE 06/05/2017 0852   HGBUR NEGATIVE 12/30/2021 0735   BILIRUBINUR NEGATIVE 07/23/2018 1826   KETONESUR NEGATIVE 12/30/2021 0735   PROTEINUR TRACE (A) 12/30/2021 0735   UROBILINOGEN 1.0 06/05/2017 0852   NITRITE NEGATIVE 12/30/2021 0735    LEUKOCYTESUR TRACE (A) 12/30/2021 0735   Sepsis Labs Recent Labs  Lab 12/30/21 1234 12/31/21 0451  WBC 6.7 6.1   Microbiology No results found for this or any previous visit (from the past 240 hour(s)).   Total time spend on discharging this patient, including the last patient exam, discussing the hospital stay, instructions for ongoing care as it relates to all pertinent caregivers, as well as preparing the medical discharge records, prescriptions, and/or referrals as applicable, is 35 minutes.    Enzo Bi, MD  [facility] 12/31/2021, 9:28 AM

## 2022-01-03 ENCOUNTER — Telehealth: Payer: Self-pay

## 2022-01-03 NOTE — Telephone Encounter (Signed)
-----   Message from Delorise Jackson, MD sent at 12/31/2021  8:16 AM EDT ----- Potassium low rec ED (he went) for iv potassium and magnesium low -sent magnesium to the pharmacy too   Liver enzymes elevated likely alcoholic hepatitis  PSA normal prostate cancer screening  Urine  ok

## 2022-01-03 NOTE — Telephone Encounter (Signed)
LMTCB for lab results: Potassium low rec ED (he went) for iv potassium and magnesium low  -sent magnesium to the pharmacy too     Liver enzymes elevated likely alcoholic hepatitis  PSA normal prostate cancer screening  Urine  ok

## 2022-01-06 ENCOUNTER — Telehealth: Payer: Self-pay

## 2022-01-06 NOTE — Telephone Encounter (Signed)
LMOM for pt to CB in regards to labs:   McLean-Scocuzza, Nino Glow, MD  Gracy Racer, CMA Potassium low rec ED (he went) for iv potassium and magnesium low  -sent magnesium to the pharmacy too     Liver enzymes elevated likely alcoholic hepatitis  PSA normal prostate cancer screening  Urine  ok

## 2022-01-07 ENCOUNTER — Telehealth: Payer: Self-pay

## 2022-01-07 NOTE — Telephone Encounter (Signed)
This is the 3rd attempt at trying to contact pt in regards to his labs. I LMOM to CB. Will try once more later if not a letter will be mailed out.

## 2022-02-20 NOTE — Progress Notes (Deleted)
Virtual Visit via Video Note   This visit type was conducted due to national recommendations for restrictions regarding the COVID-19 Pandemic (e.g. social distancing) in an effort to limit this patient's exposure and mitigate transmission in our community.  Due to his co-morbid illnesses, this patient is at least at moderate risk for complications without adequate follow up.  This format is felt to be most appropriate for this patient at this time.  All issues noted in this document were discussed and addressed.  A limited physical exam was performed with this format.  Please refer to the patient's chart for his consent to telehealth for [facility].   I connected with  Darrell Santiago on 02/20/22 by a video enabled telemedicine application and verified that I am speaking with the correct person using two identifiers. I discussed the limitations of evaluation and management by telemedicine. The patient expressed understanding and agreed to proceed.   Evaluation Performed:  Follow-up visit  Date:  02/20/2022   ID:  Darrell Santiago, DOB 25-May-1965, MRN [id number]  Patient Location:  [place] [address]   Provider location:   [facility], [place] office  PCP:  McLean-Scocuzza, Nino Glow, MD  Cardiologist:  Patsy Baltimore   Chief Complaint: Coronary artery disease seen on CT scan  New Patient  History of Present Illness:    Darrell Santiago is a 56 y.o. male  past medical history of HTN ETOH abuse Smoker, 1 ppd Coronary calcification seen on CT scan Prior cardiac catheterization 2018 with no significant stenosis Referred by Dr. Orland Mustard for consultation of his coronary artery disease and atypical chest pain  Previously seen in 2020 for atypical chest pain   S/p hosp d/c 0/9628 with complications pneumonia pancreatitic pseudocyst and complicated hospitalization  Pancreatic pseudocyst with stent and pancreatic  pleural effusion  -effusion resolved stent removal via ERCP 09/11/2018 [facility]   He is scheduled to follow-up with GI to have stent removed per the patient  Denies any significant shortness of breath or chest pain Continues to smoke 1 pack/day  Previous cardiac catheterization reviewed from 2018,showing no significant coronary disease There is coronary calcification noted in the ostial LAD, ostial circumflex region consistent with findings on CT scan done recently -Report in the computer does not detail that there is no significant disease  Recent CT scan as detailed below showing coronary calcification ostial LAD, ostial left circumflex bifurcation region   Prior CV studies:   The following studies were reviewed today:  Ct scan FINDINGS: Cardiovascular: Aortic atherosclerosis. Borderline cardiomegaly, without pericardial effusion. Proximal LAD and possible distal left main coronary artery calcification  Past Medical History:  Diagnosis Date   Abnormal EKG    Allergy    Seasonal   Anxiety    Chicken pox    COPD (chronic obstructive pulmonary disease) ([place])    COVID-19    04/20/21   Depression    GERD (gastroesophageal reflux disease)    Hyperlipidemia    Hypertension    Urine incontinence    Past Surgical History:  Procedure Laterality Date   CHOLECYSTECTOMY  2000   LEFT HEART CATH AND CORONARY ANGIOGRAPHY N/A 01/31/2017   Procedure: LEFT HEART CATH AND CORONARY ANGIOGRAPHY;  Surgeon: Dionisio David, MD;  Location: [place] [facility];  Service: Cardiovascular;  Laterality: N/A;     No outpatient medications have been marked as taking for the 02/21/22 encounter (Appointment) with  Minna Merritts, MD.     Allergies:   Patient has no known allergies.   Social History   Tobacco Use   Smoking status: Every Day    Packs/day: 1.00    Years: 26.00    Total pack years: 26.00    Types: Cigarettes   Smokeless tobacco: Never   Tobacco comments:    little less than  one ppd  Vaping Use   Vaping Use: Never used  Substance Use Topics   Alcohol use: Yes    Alcohol/week: 21.0 standard drinks of alcohol    Types: 21 Shots of liquor per week    Comment: 1 pt will last 1 week    Drug use: No     Current Outpatient Medications on File Prior to Visit  Medication Sig Dispense Refill   acetaminophen (TYLENOL) 325 MG tablet Take 2 tablets (650 mg total) by mouth every 6 (six) hours as needed for mild pain (or Fever >/= 101).     amLODipine (NORVASC) 2.5 MG tablet Take 1 tablet (2.5 mg total) by mouth daily. 90 tablet 3   aspirin 81 MG chewable tablet Chew 1 tablet (81 mg total) by mouth daily. 90 tablet 3   budesonide-formoterol (SYMBICORT) 160-4.5 MCG/ACT inhaler Inhale 2 puffs into the lungs 2 (two) times daily. Rinse mouth after use 1 each 11   folic acid (FOLVITE) 1 MG tablet Take 1 tablet (1 mg total) by mouth daily. (Patient not taking: Reported on 11/11/2021) 30 tablet 3   hydrOXYzine (ATARAX/VISTARIL) 25 MG tablet TAKE 1 TABLET BY MOUTH DAILY AS NEEDED. 90 tablet 2   ipratropium-albuterol (DUONEB) 0.5-2.5 (3) MG/3ML SOLN Inhale 3 mLs into the lungs every 6 (six) hours as needed. 360 mL 11   LORazepam (ATIVAN) 0.5 MG tablet Take 0.5 tablets (0.25 mg total) by mouth daily as needed for anxiety or sleep. 30 tablet 2   magnesium oxide (MAG-OX) 400 MG tablet Take 1 tablet (400 mg total) by mouth daily. 90 tablet 0   Multiple Vitamin (MULTI-VITAMIN) tablet Take 1 tablet by mouth daily. 90 tablet 3   pantoprazole (PROTONIX) 20 MG tablet Take 1 tablet (20 mg total) by mouth daily. 30 minutes before food 90 tablet 3   potassium chloride SA (KLOR-CON M) 20 MEQ tablet Take 1 tablet (20 mEq total) by mouth 2 (two) times daily. 90 tablet 3   pravastatin (PRAVACHOL) 40 MG tablet Take 1 tablet (40 mg total) by mouth at bedtime. 90 tablet 3   sertraline (ZOLOFT) 50 MG tablet Take 1 tablet (50 mg total) by mouth daily at 12 noon. Note tablet changed from 1/2 of 100 mg to 50  mg qd (Patient taking differently: Take 50 mg by mouth daily at 12 noon.) 90 tablet 3   thiamine (VITAMIN B-1) 100 MG tablet Take 1 tablet (100 mg total) by mouth daily.     vitamin B-12 (CYANOCOBALAMIN) 1000 MCG tablet Take 1,000 mcg by mouth daily.      No current facility-administered medications on file prior to visit.     Family Hx: The patient's family history includes Alcohol abuse in his father; Cancer in his father and maternal grandfather; Diabetes in his father.  ROS:   Please see the history of present illness.    Review of Systems  Constitutional: Negative.   Respiratory: Negative.    Cardiovascular: Negative.   Gastrointestinal: Negative.   Musculoskeletal: Negative.   Neurological: Negative.   Psychiatric/Behavioral: Negative.    All other systems reviewed  and are negative.    Labs/Other Tests and Data Reviewed:    Recent Labs: 04/26/2021: B Natriuretic Peptide 16.4 11/11/2021: TSH 0.820 12/30/2021: ALT 80 12/31/2021: BUN 13; Creatinine, Ser 0.51; Hemoglobin 12.6; Magnesium 1.8; Platelets 115; Potassium 3.6; Sodium 141   Recent Lipid Panel Lab Results  Component Value Date/Time   CHOL 192 11/12/2021 05:07 AM   TRIG 44 11/12/2021 05:07 AM   HDL 99 11/12/2021 05:07 AM   CHOLHDL 1.9 11/12/2021 05:07 AM   LDLCALC 84 11/12/2021 05:07 AM    Wt Readings from Last 3 Encounters:  12/30/21 165 lb (74.8 kg)  12/22/21 185 lb (83.9 kg)  11/13/21 180 lb 8.9 oz (81.9 kg)     Exam:    Vital Signs: Vital signs may also be detailed in the HPI There were no vitals taken for this visit.  Wt Readings from Last 3 Encounters:  12/30/21 165 lb (74.8 kg)  12/22/21 185 lb (83.9 kg)  11/13/21 180 lb 8.9 oz (81.9 kg)   Temp Readings from Last 3 Encounters:  12/31/21 99.1 F (37.3 C)  11/15/21 98 F (36.7 C)  05/07/21 98.3 F (36.8 C) (Temporal)   BP Readings from Last 3 Encounters:  12/31/21 126/84  12/22/21 124/86  11/15/21 119/88   Pulse Readings from Last 3  Encounters:  12/31/21 87  11/15/21 97  05/07/21 77    138/89,  88/57 105/53  Well nourished, well developed male in no acute distress. Appears inebriated Constitutional:  oriented to person, place, and time. No distress.  Head: Normocephalic and atraumatic.  Eyes:  no discharge. No scleral icterus.  Neck: Normal range of motion. Neck supple.  Pulmonary/Chest: No audible wheezing, no distress, appears comfortable Musculoskeletal: Normal range of motion.  no  tenderness or deformity.  Neurological:   Coordination normal. Full exam not performed Skin:  No rash Psychiatric:  normal mood and affect. behavior is normal. Thought content normal.    ASSESSMENT & PLAN:    Atherosclerosis of native coronary artery of native heart with stable angina pectoris ([place]) 2018 with cardiac catheterization with no significant disease He does have coronary calcification noted on CT scan No further testing needed, no anginal symptoms, no need for repeat catheterization Would recommend smoking cessation, low-dose aspirin with statin, goal LDL less than 70  Chronic obstructive pulmonary disease, unspecified COPD type ([place]) Recommended smoking cessation  Hyperlipidemia, unspecified hyperlipidemia type On pravastatin, goal LDL less than 70 If numbers continue to run high could add Zetia  Essential hypertension Blood pressure is well controlled on today's visit. No changes made to the medications.  Pancreatic pseudocyst Recent hospitalization, alcohol cessation recommended appeared  Inebriated on today's video visit  Tobacco abuse Discussed various modalities for smoking cessation Wife will work with him on quitting      Signed, Ida Rogue, MD  02/20/2022 10:23 AM    [place] [id number], [place], [place] [address]

## 2022-02-21 ENCOUNTER — Encounter: Payer: Self-pay | Admitting: Cardiovascular Disease

## 2022-02-21 ENCOUNTER — Ambulatory Visit: Payer: BC Managed Care – PPO | Attending: Cardiovascular Disease | Admitting: Cardiovascular Disease

## 2022-02-21 DIAGNOSIS — I25118 Atherosclerotic heart disease of native coronary artery with other forms of angina pectoris: Secondary | ICD-10-CM

## 2022-02-21 DIAGNOSIS — J449 Chronic obstructive pulmonary disease, unspecified: Secondary | ICD-10-CM

## 2022-02-21 DIAGNOSIS — E782 Mixed hyperlipidemia: Secondary | ICD-10-CM

## 2022-02-21 DIAGNOSIS — F1010 Alcohol abuse, uncomplicated: Secondary | ICD-10-CM

## 2022-02-21 DIAGNOSIS — I10 Essential (primary) hypertension: Secondary | ICD-10-CM

## 2022-03-22 ENCOUNTER — Encounter: Payer: Self-pay | Admitting: Family Medicine

## 2022-03-23 ENCOUNTER — Encounter: Payer: Self-pay | Admitting: Family Medicine

## 2022-03-31 ENCOUNTER — Encounter: Payer: BC Managed Care – PPO | Admitting: Family Medicine

## 2022-03-31 NOTE — Progress Notes (Incomplete)
   SUBJECTIVE:  No chief complaint on file.  HPI ***  PERTINENT PMH / PSH: ***  OBJECTIVE:  There were no vitals taken for this visit.   Physical Exam  ASSESSMENT/PLAN:  There are no diagnoses linked to this encounter. PDMP reviewed***  No follow-ups on file.  Carollee Leitz, MD

## 2022-06-30 ENCOUNTER — Ambulatory Visit: Payer: BC Managed Care – PPO

## 2022-06-30 ENCOUNTER — Emergency Department: Payer: BC Managed Care – PPO

## 2022-06-30 ENCOUNTER — Encounter: Payer: Self-pay | Admitting: Radiology

## 2022-06-30 ENCOUNTER — Inpatient Hospital Stay
Admission: EM | Admit: 2022-06-30 | Discharge: 2022-07-03 | DRG: 069 | Disposition: A | Discharge status: Home / Self Care | Payer: BC Managed Care – PPO | Attending: Student | Admitting: Student

## 2022-06-30 ENCOUNTER — Other Ambulatory Visit: Payer: Self-pay

## 2022-06-30 DIAGNOSIS — Z79899 Other long term (current) drug therapy: Secondary | ICD-10-CM

## 2022-06-30 DIAGNOSIS — G459 Transient cerebral ischemic attack, unspecified: Principal | ICD-10-CM

## 2022-06-30 DIAGNOSIS — R531 Weakness: Secondary | ICD-10-CM

## 2022-06-30 DIAGNOSIS — Z809 Family history of malignant neoplasm, unspecified: Secondary | ICD-10-CM

## 2022-06-30 DIAGNOSIS — I10 Essential (primary) hypertension: Secondary | ICD-10-CM | POA: Diagnosis present

## 2022-06-30 DIAGNOSIS — F17200 Nicotine dependence, unspecified, uncomplicated: Secondary | ICD-10-CM | POA: Diagnosis present

## 2022-06-30 DIAGNOSIS — Z811 Family history of alcohol abuse and dependence: Secondary | ICD-10-CM

## 2022-06-30 DIAGNOSIS — J441 Chronic obstructive pulmonary disease with (acute) exacerbation: Secondary | ICD-10-CM | POA: Diagnosis present

## 2022-06-30 DIAGNOSIS — J449 Chronic obstructive pulmonary disease, unspecified: Secondary | ICD-10-CM | POA: Diagnosis present

## 2022-06-30 DIAGNOSIS — Z833 Family history of diabetes mellitus: Secondary | ICD-10-CM

## 2022-06-30 DIAGNOSIS — F32A Depression, unspecified: Secondary | ICD-10-CM | POA: Diagnosis present

## 2022-06-30 DIAGNOSIS — F10929 Alcohol use, unspecified with intoxication, unspecified: Secondary | ICD-10-CM | POA: Diagnosis not present

## 2022-06-30 DIAGNOSIS — Z800 Family history of malignant neoplasm of digestive organs: Secondary | ICD-10-CM

## 2022-06-30 DIAGNOSIS — Z7982 Long term (current) use of aspirin: Secondary | ICD-10-CM

## 2022-06-30 DIAGNOSIS — Y906 Blood alcohol level of 120-199 mg/100 ml: Secondary | ICD-10-CM | POA: Diagnosis present

## 2022-06-30 DIAGNOSIS — Z7951 Long term (current) use of inhaled steroids: Secondary | ICD-10-CM

## 2022-06-30 DIAGNOSIS — E785 Hyperlipidemia, unspecified: Secondary | ICD-10-CM | POA: Diagnosis present

## 2022-06-30 DIAGNOSIS — F17210 Nicotine dependence, cigarettes, uncomplicated: Secondary | ICD-10-CM | POA: Diagnosis present

## 2022-06-30 DIAGNOSIS — D696 Thrombocytopenia, unspecified: Secondary | ICD-10-CM | POA: Insufficient documentation

## 2022-06-30 DIAGNOSIS — E639 Nutritional deficiency, unspecified: Secondary | ICD-10-CM | POA: Diagnosis present

## 2022-06-30 DIAGNOSIS — Z8616 Personal history of COVID-19: Secondary | ICD-10-CM

## 2022-06-30 DIAGNOSIS — H53462 Homonymous bilateral field defects, left side: Secondary | ICD-10-CM | POA: Diagnosis present

## 2022-06-30 DIAGNOSIS — F419 Anxiety disorder, unspecified: Secondary | ICD-10-CM | POA: Diagnosis present

## 2022-06-30 DIAGNOSIS — K219 Gastro-esophageal reflux disease without esophagitis: Secondary | ICD-10-CM | POA: Diagnosis present

## 2022-06-30 DIAGNOSIS — F1010 Alcohol abuse, uncomplicated: Secondary | ICD-10-CM | POA: Diagnosis not present

## 2022-06-30 DIAGNOSIS — Z720 Tobacco use: Secondary | ICD-10-CM | POA: Diagnosis present

## 2022-06-30 DIAGNOSIS — E876 Hypokalemia: Secondary | ICD-10-CM | POA: Diagnosis present

## 2022-06-30 DIAGNOSIS — R471 Dysarthria and anarthria: Secondary | ICD-10-CM | POA: Diagnosis not present

## 2022-06-30 LAB — AMMONIA: Ammonia: 23 umol/L (ref 9–35)

## 2022-06-30 LAB — DIFFERENTIAL
Abs Immature Granulocytes: 0.02 10*3/uL (ref 0.00–0.07)
Basophils Absolute: 0.1 10*3/uL (ref 0.0–0.1)
Basophils Relative: 1 %
Eosinophils Absolute: 0.2 10*3/uL (ref 0.0–0.5)
Eosinophils Relative: 3 %
Immature Granulocytes: 0 %
Lymphocytes Relative: 34 %
Lymphs Abs: 1.9 10*3/uL (ref 0.7–4.0)
Monocytes Absolute: 0.7 10*3/uL (ref 0.1–1.0)
Monocytes Relative: 13 %
Neutro Abs: 2.7 10*3/uL (ref 1.7–7.7)
Neutrophils Relative %: 49 %

## 2022-06-30 LAB — PROTIME-INR
INR: 1 (ref 0.8–1.2)
Prothrombin Time: 12.9 seconds (ref 11.4–15.2)

## 2022-06-30 LAB — COMPREHENSIVE METABOLIC PANEL
ALT: 23 U/L (ref 0–44)
AST: 56 U/L — ABNORMAL HIGH (ref 15–41)
Albumin: 3.9 g/dL (ref 3.5–5.0)
Alkaline Phosphatase: 81 U/L (ref 38–126)
Anion gap: 15 (ref 5–15)
BUN: 13 mg/dL (ref 6–20)
CO2: 26 mmol/L (ref 22–32)
Calcium: 9.3 mg/dL (ref 8.9–10.3)
Chloride: 98 mmol/L (ref 98–111)
Creatinine, Ser: 0.74 mg/dL (ref 0.61–1.24)
GFR, Estimated: >60 mL/min (ref >60)
Glucose, Bld: 103 mg/dL — ABNORMAL HIGH (ref 70–99)
Potassium: 2.3 mmol/L — CL (ref 3.5–5.1)
Sodium: 139 mmol/L (ref 135–145)
Total Bilirubin: 0.7 mg/dL (ref 0.3–1.2)
Total Protein: 6.9 g/dL (ref 6.5–8.1)

## 2022-06-30 LAB — CBC
HCT: 42.6 % (ref 39.0–52.0)
Hemoglobin: 14.4 g/dL (ref 13.0–17.0)
MCH: 35 pg — ABNORMAL HIGH (ref 26.0–34.0)
MCHC: 33.8 g/dL (ref 30.0–36.0)
MCV: 103.6 fL — ABNORMAL HIGH (ref 80.0–100.0)
Platelets: 144 10*3/uL — ABNORMAL LOW (ref 150–400)
RBC: 4.11 MIL/uL — ABNORMAL LOW (ref 4.22–5.81)
RDW: 14.4 % (ref 11.5–15.5)
WBC: 5.5 10*3/uL (ref 4.0–10.5)
nRBC: 0 % (ref 0.0–0.2)

## 2022-06-30 LAB — POTASSIUM: Potassium: 2.6 mmol/L — CL (ref 3.5–5.1)

## 2022-06-30 LAB — MAGNESIUM
Magnesium: 1.6 mg/dL — ABNORMAL LOW (ref 1.7–2.4)
Magnesium: 1.7 mg/dL (ref 1.7–2.4)

## 2022-06-30 LAB — ETHANOL: Alcohol, Ethyl (B): 164 mg/dL — ABNORMAL HIGH (ref <10)

## 2022-06-30 LAB — APTT: aPTT: 31 seconds (ref 24–36)

## 2022-06-30 LAB — CBG MONITORING, ED: Glucose-Capillary: 129 mg/dL — ABNORMAL HIGH (ref 70–99)

## 2022-06-30 MED ORDER — SODIUM CHLORIDE 0.9% FLUSH: 3.0000 mL | Freq: Once | INTRAVENOUS | Status: DC

## 2022-06-30 MED ORDER — PRAVASTATIN SODIUM 20 MG PO TABS: 40.0000 mg | ORAL_TABLET | Freq: Every day | ORAL | Status: DC

## 2022-06-30 MED ORDER — VITAMIN B-12 1000 MCG PO TABS
1000.0000 ug | ORAL_TABLET | Freq: Every day | ORAL | Status: DC
  Administered 2022-06-30 – 2022-07-03 (×4): 1000 ug via ORAL
  Filled 2022-06-30: qty 2
  Filled 2022-06-30 (×3): qty 1

## 2022-06-30 MED ORDER — LORAZEPAM 0.5 MG PO TABS
0.5000 mg | ORAL_TABLET | Freq: Two times a day (BID) | ORAL | Status: DC | PRN
  Administered 2022-06-30: 0.5 mg via ORAL
  Filled 2022-06-30: qty 1

## 2022-06-30 MED ORDER — IPRATROPIUM-ALBUTEROL 0.5-2.5 (3) MG/3ML IN SOLN: 3.0000 mL | Freq: Four times a day (QID) | RESPIRATORY_TRACT | Status: DC | PRN

## 2022-06-30 MED ORDER — KCL IN DEXTROSE-NACL 20-5-0.45 MEQ/L-%-% IV SOLN
INTRAVENOUS | Status: DC
  Filled 2022-06-30 (×3): qty 1000

## 2022-06-30 MED ORDER — MAGNESIUM SULFATE 2 GM/50ML IV SOLN
2.0000 g | Freq: Once | INTRAVENOUS | Status: AC
  Administered 2022-06-30: 2 g via INTRAVENOUS
  Filled 2022-06-30: qty 50

## 2022-06-30 MED ORDER — PANTOPRAZOLE SODIUM 40 MG PO TBEC
40.0000 mg | DELAYED_RELEASE_TABLET | Freq: Every day | ORAL | Status: DC
  Administered 2022-06-30 – 2022-07-03 (×4): 40 mg via ORAL
  Filled 2022-06-30 (×4): qty 1

## 2022-06-30 MED ORDER — POTASSIUM CHLORIDE 10 MEQ/100ML IV SOLN
10.0000 meq | INTRAVENOUS | Status: AC
  Administered 2022-06-30 (×2): 10 meq via INTRAVENOUS
  Filled 2022-06-30: qty 100

## 2022-06-30 MED ORDER — SERTRALINE HCL 50 MG PO TABS
50.0000 mg | ORAL_TABLET | Freq: Every day | ORAL | Status: DC
  Administered 2022-07-01 – 2022-07-02 (×2): 50 mg via ORAL
  Filled 2022-06-30 (×2): qty 1

## 2022-06-30 MED ORDER — FOLIC ACID 1 MG PO TABS
1.0000 mg | ORAL_TABLET | Freq: Every day | ORAL | Status: DC
  Administered 2022-06-30 – 2022-07-03 (×4): 1 mg via ORAL
  Filled 2022-06-30 (×4): qty 1

## 2022-06-30 MED ORDER — LORAZEPAM 1 MG PO TABS: 1.0000 mg | ORAL_TABLET | ORAL | Status: DC | PRN

## 2022-06-30 MED ORDER — CLOPIDOGREL BISULFATE 75 MG PO TABS
75.0000 mg | ORAL_TABLET | Freq: Every day | ORAL | Status: DC
  Administered 2022-06-30 – 2022-07-03 (×4): 75 mg via ORAL
  Filled 2022-06-30 (×4): qty 1

## 2022-06-30 MED ORDER — NICOTINE 21 MG/24HR TD PT24
21.0000 mg | MEDICATED_PATCH | Freq: Every day | TRANSDERMAL | Status: DC
  Administered 2022-06-30 – 2022-07-02 (×3): 21 mg via TRANSDERMAL
  Filled 2022-06-30 (×4): qty 1

## 2022-06-30 MED ORDER — STROKE: EARLY STAGES OF RECOVERY BOOK: Freq: Once | Status: AC

## 2022-06-30 MED ORDER — ADULT MULTIVITAMIN W/MINERALS CH
1.0000 | ORAL_TABLET | Freq: Every day | ORAL | Status: DC
  Administered 2022-06-30 – 2022-07-03 (×4): 1 via ORAL
  Filled 2022-06-30 (×4): qty 1

## 2022-06-30 MED ORDER — AMLODIPINE BESYLATE 5 MG PO TABS
2.5000 mg | ORAL_TABLET | Freq: Every day | ORAL | Status: DC
  Administered 2022-06-30: 2.5 mg via ORAL
  Filled 2022-06-30: qty 1

## 2022-06-30 MED ORDER — POTASSIUM CHLORIDE CRYS ER 20 MEQ PO TBCR
40.0000 meq | EXTENDED_RELEASE_TABLET | Freq: Once | ORAL | Status: AC
  Administered 2022-06-30: 40 meq via ORAL
  Filled 2022-06-30: qty 2

## 2022-06-30 MED ORDER — THIAMINE HCL 100 MG/ML IJ SOLN: 100.0000 mg | Freq: Every day | INTRAMUSCULAR | Status: DC

## 2022-06-30 MED ORDER — ATORVASTATIN CALCIUM 20 MG PO TABS
80.0000 mg | ORAL_TABLET | Freq: Every day | ORAL | Status: DC
  Administered 2022-06-30 – 2022-07-03 (×4): 80 mg via ORAL
  Filled 2022-06-30 (×4): qty 4

## 2022-06-30 MED ORDER — ASPIRIN 81 MG PO TBEC
81.0000 mg | DELAYED_RELEASE_TABLET | Freq: Every day | ORAL | Status: DC
  Administered 2022-06-30 – 2022-07-03 (×4): 81 mg via ORAL
  Filled 2022-06-30 (×4): qty 1

## 2022-06-30 MED ORDER — THIAMINE MONONITRATE 100 MG PO TABS
100.0000 mg | ORAL_TABLET | Freq: Every day | ORAL | Status: DC
  Administered 2022-06-30 – 2022-07-03 (×4): 100 mg via ORAL
  Filled 2022-06-30 (×4): qty 1

## 2022-06-30 MED ORDER — IOHEXOL 350 MG/ML SOLN
75.0000 mL | Freq: Once | INTRAVENOUS | Status: AC | PRN
  Administered 2022-06-30: 75 mL via INTRAVENOUS

## 2022-06-30 MED ORDER — POTASSIUM CHLORIDE 10 MEQ/100ML IV SOLN
10.0000 meq | INTRAVENOUS | Status: AC
  Administered 2022-06-30 (×2): 10 meq via INTRAVENOUS
  Filled 2022-06-30 (×2): qty 100

## 2022-06-30 NOTE — Progress Notes (Signed)
Eeg done 

## 2022-06-30 NOTE — Consult Note (Signed)
NEUROLOGY CONSULTATION NOTE   Date of service: June 30, 2022 Patient Name: Darrell Santiago MRN:  [id number] DOB:  Feb 18, 1966 Reason for consult: stroke code Requesting physician: AMS _ _ _   _ __   _ __ _ _  __ __   _ __   __ _  History of Present Illness   This is a 57 year old gentleman with past medical history significant for hypertension, hyperlipidemia, COPD, tobacco abuse, alcohol abuse who was brought in by EMS from work for acting abnormally.  He appeared intoxicated on examination although he stated that he was in his usual state of health when he arrived at work.  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  He went to his employer and said that he needed to go home but the employer called EMS because he was calling people by the wrong names.  EMS said that his mental status did improve to some degree and route to the hospital.  On examination he had a right lower quadrantanopia in his left eye only, right leg drift, decreased sensation on the right side, and dysarthria.  NIHSS = 4. CT head showed no acute process on personal review.  CTA showed no large vessel occlusion or hemodynamically significant stenosis on personal review. TNK was not administered bc last known well could not be confirmed since patient self-reported being normal prior to 0900 but was intoxicated, altered, and agitated on exam (though oriented to age and month). MRI brain was performed after the stroke code and was normal.   Ethanol 164. Wife states he drinks 1 pint per day of liquor and she thinks his last drink was last night.   ROS   Per HPI: all other systems reviewed and are negative  Past History   I have reviewed the following:  Past Medical History:  Diagnosis Date   Abnormal EKG    Allergy    Seasonal   Anxiety    Chicken pox    COPD (chronic obstructive pulmonary disease) ([place])    COVID-19    04/20/21   Depression    GERD (gastroesophageal reflux  disease)    Hyperlipidemia    Hypertension    Urine incontinence    Past Surgical History:  Procedure Laterality Date   CHOLECYSTECTOMY  2000   LEFT HEART CATH AND CORONARY ANGIOGRAPHY N/A 01/31/2017   Procedure: LEFT HEART CATH AND CORONARY ANGIOGRAPHY;  Surgeon: Dionisio David, MD;  Location: [place] [facility];  Service: Cardiovascular;  Laterality: N/A;   Family History  Problem Relation Age of Onset   Alcohol abuse Father    Diabetes Father    Cancer Father        ?stage IV thyroid with met   Cancer Maternal Grandfather        Colon Cancer   Social History   Socioeconomic History   Marital status: Married    Spouse name: Not on file   Number of children: Not on file   Years of education: Not on file   Highest education level: Not on file  Occupational History   Not on file  Tobacco Use   Smoking status: Every Day    Packs/day: 1.00    Years: 26.00    Additional pack years: 0.00    Total pack years: 26.00    Types: Cigarettes   Smokeless tobacco: Never   Tobacco comments:    little less than one ppd  Vaping  Use   Vaping Use: Never used  Substance and Sexual Activity   Alcohol use: Yes    Alcohol/week: 21.0 standard drinks of alcohol    Types: 21 Shots of liquor per week    Comment: 1 pt will last 1 week    Drug use: No   Sexual activity: Yes    Partners: Female  Other Topics Concern   Not on file  Social History Narrative   Married   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   Caffeine- Sodas 2-3 a day, tea occasionally, no coffee       Likes Publishing rights manager   Social Determinants of Health   Financial Resource Strain: Not on file  Food Insecurity: No Food Insecurity (12/30/2021)   Hunger Vital Sign    Worried About Running Out of Food in the Last Year: Never true    [place] in the Last Year: Never true  Transportation Needs: No Transportation Needs (12/30/2021)   PRAPARE - Radiographer, therapeutic (Medical): No    Lack of Transportation (Non-Medical): No  Physical Activity: Not on file  Stress: Not on file  Social Connections: Not on file   No Known Allergies  Medications   (Not in a hospital admission)     Current Facility-Administered Medications:    potassium chloride 10 mEq in 100 mL IVPB, 10 mEq, Intravenous, Q1 Hr x 4, Vanessa [place], MD, Last Rate: 100 mL/hr at 06/30/22 1316, 10 mEq at 06/30/22 1316   sodium chloride flush (NS) 0.9 % injection 3 mL, 3 mL, Intravenous, Once, Vanessa [place], MD  Current Outpatient Medications:    acetaminophen (TYLENOL) 325 MG tablet, Take 2 tablets (650 mg total) by mouth every 6 (six) hours as needed for mild pain (or Fever >/= 101)., Disp: , Rfl:    budesonide-formoterol (SYMBICORT) 160-4.5 MCG/ACT inhaler, Inhale 2 puffs into the lungs 2 (two) times daily. Rinse mouth after use, Disp: 1 each, Rfl: 11   hydrOXYzine (ATARAX/VISTARIL) 25 MG tablet, TAKE 1 TABLET BY MOUTH DAILY AS NEEDED., Disp: 90 tablet, Rfl: 2   ipratropium-albuterol (DUONEB) 0.5-2.5 (3) MG/3ML SOLN, Inhale 3 mLs into the lungs every 6 (six) hours as needed., Disp: 360 mL, Rfl: 11   LORazepam (ATIVAN) 0.5 MG tablet, Take 0.5 tablets (0.25 mg total) by mouth daily as needed for anxiety or sleep., Disp: 30 tablet, Rfl: 2   omeprazole (PRILOSEC) 20 MG capsule, Take 20 mg by mouth daily., Disp: , Rfl:    amLODipine (NORVASC) 2.5 MG tablet, Take 1 tablet (2.5 mg total) by mouth daily., Disp: 90 tablet, Rfl: 3   aspirin 81 MG chewable tablet, Chew 1 tablet (81 mg total) by mouth daily., Disp: 90 tablet, Rfl: 3   folic acid (FOLVITE) 1 MG tablet, Take 1 tablet (1 mg total) by mouth daily., Disp: 30 tablet, Rfl: 3   magnesium oxide (MAG-OX) 400 MG tablet, Take 1 tablet (400 mg total) by mouth daily., Disp: 90 tablet, Rfl: 0   Multiple Vitamin (MULTI-VITAMIN) tablet, Take 1 tablet by mouth daily., Disp: 90 tablet, Rfl: 3   pantoprazole (PROTONIX) 20 MG tablet, Take 1  tablet (20 mg total) by mouth daily. 30 minutes before food (Patient not taking: Reported on 06/30/2022), Disp: 90 tablet, Rfl: 3   potassium chloride SA (KLOR-CON M) 20 MEQ tablet, Take 1 tablet (20 mEq total) by mouth 2 (two) times daily., Disp: 90 tablet, Rfl: 3  pravastatin (PRAVACHOL) 40 MG tablet, Take 1 tablet (40 mg total) by mouth at bedtime., Disp: 90 tablet, Rfl: 3   sertraline (ZOLOFT) 50 MG tablet, Take 1 tablet (50 mg total) by mouth daily at 12 noon. Note tablet changed from 1/2 of 100 mg to 50 mg qd (Patient taking differently: Take 50 mg by mouth daily at 12 noon.), Disp: 90 tablet, Rfl: 3   thiamine (VITAMIN B-1) 100 MG tablet, Take 1 tablet (100 mg total) by mouth daily., Disp: , Rfl:    vitamin B-12 (CYANOCOBALAMIN) 1000 MCG tablet, Take 1,000 mcg by mouth daily. , Disp: , Rfl:   Vitals   Vitals:   06/30/22 1106 06/30/22 1130 06/30/22 1300 06/30/22 1330  BP: 136/89 118/77 (!) 136/92 (!) 144/88  Pulse: (!) 108 72 70 76  Resp: '18 11 17 19  '$ Temp: 98 F (36.7 C)     TempSrc: Oral     SpO2: 98% 100% 98% 99%     There is no height or weight on file to calculate BMI.  Physical Exam   Physical Exam Gen: agitated, somewhat confused when providing history, appears intoxicated, able to follow simple commands but poorly cooperative, oriented to age and month HEENT: Atraumatic, normocephalic;mucous membranes moist; oropharynx clear, tongue without atrophy or fasciculations, conjunctival injection. Neck: Supple, trachea midline. Resp: CTAB, no w/r/r CV: RRR, no m/g/r; nml S1 and S2. 2+ symmetric peripheral pulses. Abd: soft/NT/ND; nabs x 4 quad Extrem: Nml bulk; no cyanosis, clubbing, or edema.  Neuro: *MS: agitated, somewhat confused when providing history, appears intoxicated, able to follow simple commands but poorly cooperative, oriented to age and month *Speech: moderate dysarthria, no aphasia *CN:    I: Deferred   II,III: PERRLA, R lower quadrantopia L eye only  otherwise VFF by confrontation, optic discs unable to be visualized 2/2 pupillary constriction   III,IV,VI: EOMI w/o nystagmus, no ptosis   V: Sensation intact from V1 to V3 to LT   VII: Eyelid closure was full.  Smile symmetric.   VIII: Hearing intact to voice   IX,X: Voice normal, palate elevates symmetrically    XI: SCM/trap 5/5 bilat   XII: Tongue protrudes midline, no atrophy or fasciculations  *Motor:   Normal bulk.  No tremor, rigidity or bradykinesia. Drift in RLE only, no drift in other extremities. Decreased grip strength R compared to L *Sensory: Impaired to LT RUE. Symmetric. Propioception intact bilat.  No double-simultaneous extinction.  *Coordination:  dysmetria but no ataxia on FNF bilat *Reflexes:  2+ and symmetric throughout without clonus; toes down-going bilat *Gait: deferred  NIHSS  1a Level of Conscious.: 0 1b LOC Questions: 0 1c LOC Commands: 0 2 Best Gaze: 0 3 Visual: 1 4 Facial Palsy: 0 5a Motor Arm - left: 0 5b Motor Arm - Right: 0 6a Motor Leg - Left: 0 6b Motor Leg - Right: 1 7 Limb Ataxia: 0 8 Sensory: 1 9 Best Language: 0 10 Dysarthria: 1 11 Extinct. and Inatten.: 0  TOTAL: 4   Premorbid mRS = 1   Labs   CBC:  Recent Labs  Lab 06/30/22 1042  WBC 5.5  NEUTROABS 2.7  HGB 14.4  HCT 42.6  MCV 103.6*  PLT 144*    Basic Metabolic Panel:  Lab Results  Component Value Date   NA 139 06/30/2022   K 2.3 (LL) 06/30/2022   CO2 26 06/30/2022   GLUCOSE 103 (H) 06/30/2022   BUN 13 06/30/2022   CREATININE 0.74 06/30/2022   CALCIUM 9.3  06/30/2022   GFRNONAA >60 06/30/2022   GFRAA >60 07/26/2018   Lipid Panel:  Lab Results  Component Value Date   LDLCALC 84 11/12/2021   HgbA1c:  Lab Results  Component Value Date   HGBA1C 4.7 (L) 03/01/2018   Urine Drug Screen: No results found for: "LABOPIA", "COCAINSCRNUR", "LABBENZ", "AMPHETMU", "THCU", "LABBARB"  Alcohol Level     Component Value Date/Time   ETH 164 (H) 06/30/2022 1042     CT Head without contrast: No acute process, ASPECTS 10  CT angio Head and Neck with contrast: No emergent large vessel occlusion or proximal hemodynamically significant stenosis.  MRI Brain No acute intracranial process.   CNS imaging personally reviewed; I agree with above interpretations.  Impression   This is a 57 year old gentleman with past medical history significant for hypertension, hyperlipidemia, COPD, tobacco abuse, alcohol abuse who was brought in by EMS from work for acting abnormally.  Stroke code was activated but he did not receive TNK 2/2 unclear LKW. While some of his symptoms (confusion, dysarthria) can be attributed to intoxication his resolving R sided weakness is concerning for ischemic event. Given MRI brain showed no acute process, presentation is most consistent with TIA. Given the profound disorientation he experienced at sx onset will also obtain EEG.  Recommendations   - Admit for TIA workup - Goal normotension given neg MRI, avoid hypotension - TTE w/ bubble - Check A1c and LDL + add statin per guidelines - ASA '81mg'$  daily + plavix '75mg'$  daily x21 days f/b ASA '81mg'$  daily monotherapy after that - q4 hr neuro checks - STAT head CT for any change in neuro exam - Tele - PT/OT/SLP - Stroke education - Tobacco abuse counseling - EtOH abuse counseling - CIWA - Thiamine - rEEG - Amb referral to neurology upon discharge - Neurology will continue to follow  ______________________________________________________________________   Thank you for the opportunity to take part in the care of this patient. If you have any further questions, please contact the neurology consultation attending.  Signed,  Su Monks, MD [facility] [phone]  If 7pm- 7am, please page neurology on call as listed in [place].  **Any copied and pasted documentation in this note was written by me in another application not billed for and pasted by me into this  document.

## 2022-06-30 NOTE — Assessment & Plan Note (Signed)
K2.3 on presentation Mag level 1.6 Acute on chronic issue in setting of chronic alcohol abuse Replete Check mag level Follow Discussed alcohol cessation at length

## 2022-06-30 NOTE — H&P (Addendum)
History and Physical    Patient: Darrell Santiago [id number] DOB: 1966-04-16 DOA: 06/30/2022 DOS: the patient was seen and examined on 06/30/2022 PCP: [facility], MD  Patient coming from:  work  Chief Complaint:  Chief Complaint  Patient presents with   Code Stroke   HPI: Darrell Santiago is a 57 y.o. male with medical history significant of COPD, alcohol abuse, hyperlipidemia, hypertension, depression presenting with TIA and alcohol abuse.  Patient reports developing significant right-sided weakness as well as confusion around 8 AM at work.  Patient attempted to leave however he was too confused.  EMS was subsequently called.  Patient denies any prior episodes like this.  Does report chronic alcohol use including drinking up to 1 pint of liquor daily.  Also 1 pack/day smoker.  No fevers or chills.  No chest pain or shortness of breath.  No abdominal pain.  No nausea or vomiting.  No reported vision changes or headache.  Symptoms persisted over the course of evaluation including upon EMS assessment.  Reports compliant with home medications including antihypertensive. Presented to the ER afebrile, hemodynamically stable.  Code stroke called with NIH score of 4.  CT head within normal limits.  MRI of the brain also obtained that was grossly within normal limits.  Neurology formally consulted with Dr. Quinn Axe.  Recommended TIA evaluation.  Started on aspirin and Plavix in the ER.  Notable labs include hemoglobin 14.4, platelet count of 144, INR of 1.  Alcohol level 164.  Potassium level 2.3.  Mag level of 1.6. Review of Systems: As mentioned in the history of present illness. All other systems reviewed and are negative. Past Medical History:  Diagnosis Date   Abnormal EKG    Allergy    Seasonal   Anxiety    Chicken pox    COPD (chronic obstructive pulmonary disease) ([place])    COVID-19    04/20/21   Depression    GERD (gastroesophageal reflux disease)    Hyperlipidemia    Hypertension     Urine incontinence    Past Surgical History:  Procedure Laterality Date   CHOLECYSTECTOMY  2000   LEFT HEART CATH AND CORONARY ANGIOGRAPHY N/A 01/31/2017   Procedure: LEFT HEART CATH AND CORONARY ANGIOGRAPHY;  Surgeon: Dionisio David, MD;  Location: [facility];  Service: Cardiovascular;  Laterality: N/A;   Social History:  reports that he has been smoking cigarettes. He has a 26.00 pack-year smoking history. He has never used smokeless tobacco. He reports current alcohol use of about 21.0 standard drinks of alcohol per week. He reports that he does not use drugs.  No Known Allergies  Family History  Problem Relation Age of Onset   Alcohol abuse Father    Diabetes Father    Cancer Father        ?stage IV thyroid with met   Cancer Maternal Grandfather        Colon Cancer    Prior to Admission medications   Medication Sig Start Date End Date Taking? Authorizing Provider  acetaminophen (TYLENOL) 325 MG tablet Take 2 tablets (650 mg total) by mouth every 6 (six) hours as needed for mild pain (or Fever >/= 101). 07/26/18  Yes Gladstone Lighter, MD  budesonide-formoterol (SYMBICORT) 160-4.5 MCG/ACT inhaler Inhale 2 puffs into the lungs 2 (two) times daily. Rinse mouth after use 12/22/21  Yes McLean-Scocuzza, Nino Glow, MD  hydrOXYzine (ATARAX/VISTARIL) 25 MG tablet TAKE 1 TABLET BY MOUTH DAILY AS NEEDED. 03/10/21  Yes McLean-Scocuzza, Olivia Mackie  N, MD  ipratropium-albuterol (DUONEB) 0.5-2.5 (3) MG/3ML SOLN Inhale 3 mLs into the lungs every 6 (six) hours as needed. 12/22/21  Yes McLean-Scocuzza, Nino Glow, MD  LORazepam (ATIVAN) 0.5 MG tablet Take 0.5 tablets (0.25 mg total) by mouth daily as needed for anxiety or sleep. 12/22/21  Yes McLean-Scocuzza, Nino Glow, MD  omeprazole (PRILOSEC) 20 MG capsule Take 20 mg by mouth daily.   Yes [provider]  amLODipine (NORVASC) 2.5 MG tablet Take 1 tablet (2.5 mg total) by mouth daily. 12/22/21   McLean-Scocuzza, Nino Glow, MD  aspirin 81 MG chewable  tablet Chew 1 tablet (81 mg total) by mouth daily. 12/22/21   McLean-Scocuzza, Nino Glow, MD  folic acid (FOLVITE) 1 MG tablet Take 1 tablet (1 mg total) by mouth daily. 05/01/21   Regalado, Belkys A, MD  magnesium oxide (MAG-OX) 400 MG tablet Take 1 tablet (400 mg total) by mouth daily. 12/30/21   McLean-Scocuzza, Nino Glow, MD  Multiple Vitamin (MULTI-VITAMIN) tablet Take 1 tablet by mouth daily. 12/22/21   McLean-Scocuzza, Nino Glow, MD  pantoprazole (PROTONIX) 20 MG tablet Take 1 tablet (20 mg total) by mouth daily. 30 minutes before food Patient not taking: Reported on 06/30/2022 12/22/21   McLean-Scocuzza, Nino Glow, MD  potassium chloride SA (KLOR-CON M) 20 MEQ tablet Take 1 tablet (20 mEq total) by mouth 2 (two) times daily. 12/22/21   McLean-Scocuzza, Nino Glow, MD  pravastatin (PRAVACHOL) 40 MG tablet Take 1 tablet (40 mg total) by mouth at bedtime. 12/22/21   McLean-Scocuzza, Nino Glow, MD  sertraline (ZOLOFT) 50 MG tablet Take 1 tablet (50 mg total) by mouth daily at 12 noon. Note tablet changed from 1/2 of 100 mg to 50 mg qd Patient taking differently: Take 50 mg by mouth daily at 12 noon. 12/22/21   McLean-Scocuzza, Nino Glow, MD  thiamine (VITAMIN B-1) 100 MG tablet Take 1 tablet (100 mg total) by mouth daily. 01/01/22   Enzo Bi, MD  vitamin B-12 (CYANOCOBALAMIN) 1000 MCG tablet Take 1,000 mcg by mouth daily.     [provider]    Physical Exam: Vitals:   06/30/22 1330 06/30/22 1400 06/30/22 1430 06/30/22 1510  BP: (!) 144/88 (!) 148/91 (!) 149/87   Pulse: 76 80 74   Resp: '19 19 19   '$ Temp:    98.2 F (36.8 C)  TempSrc:    Oral  SpO2: 99% 99% 100%    Physical Exam Constitutional:      General: He is not in acute distress.    Appearance: He is normal weight.  HENT:     Head: Normocephalic and atraumatic.     Mouth/Throat:     Mouth: Mucous membranes are moist.  Eyes:     Pupils: Pupils are equal, round, and reactive to light.  Cardiovascular:     Rate and Rhythm: Normal rate and  regular rhythm.  Pulmonary:     Effort: Pulmonary effort is normal.     Breath sounds: Normal breath sounds.  Abdominal:     General: Bowel sounds are normal.  Musculoskeletal:        General: Normal range of motion.     Comments: Mild decrease range of motion in right upper extremity secondary to shoulder pain  Skin:    General: Skin is warm.  Neurological:     General: No focal deficit present.  Psychiatric:        Mood and Affect: Mood normal.     Data Reviewed:  There are  no new results to review at this time. MR BRAIN WO CONTRAST CLINICAL DATA:  Stroke suspected  EXAM: MRI HEAD WITHOUT CONTRAST  TECHNIQUE: Multiplanar, multiecho pulse sequences of the brain and surrounding structures were obtained without intravenous contrast.  COMPARISON:  CT head 06/30/22  FINDINGS: Brain: Negative for an acute infarct. No hemorrhage. No extra-axial fluid collection. No hydrocephalus. Minimal chronic microvascular ischemic change.  Vascular: Normal flow voids.  Skull and upper cervical spine: Normal marrow signal.  Sinuses/Orbits: Trace right mastoid effusion. No middle ear effusion. Polypoid mucosal thickening bilateral maxillary sinuses and trace mucosal thickening in the bilateral ethmoid and frontal sinuses. Orbits are unremarkable.  Other: None.  IMPRESSION: No acute intracranial process.  Electronically Signed   By: Marin Roberts M.D.   On: 06/30/2022 12:47 CT ANGIO HEAD NECK W WO CM (CODE STROKE) CLINICAL DATA:  Code stroke.  EXAM: CT ANGIOGRAPHY HEAD AND NECK  TECHNIQUE: Multidetector CT imaging of the head and neck was performed using the standard protocol during bolus administration of intravenous contrast. Multiplanar CT image reconstructions and MIPs were obtained to evaluate the vascular anatomy. Carotid stenosis measurements (when applicable) are obtained utilizing NASCET criteria, using the distal internal carotid diameter as  the denominator.  RADIATION DOSE REDUCTION: This exam was performed according to the departmental dose-optimization program which includes automated exposure control, adjustment of the mA and/or kV according to patient size and/or use of iterative reconstruction technique.  CONTRAST:  74m OMNIPAQUE IOHEXOL 350 MG/ML SOLN  COMPARISON:  Same day CT head.  FINDINGS: CTA NECK FINDINGS  Aortic arch: No great vessel origins are patent without significant disease.  Right carotid system: No evidence of dissection, stenosis (50% or greater), or occlusion.  Left carotid system: No evidence of dissection, stenosis (50% or greater), or occlusion.  Vertebral arteries: Both vertebral arteries are patent without significant (greater than 50%) stenosis.  Skeleton: No acute finding on limited assessment.  Other neck: No acute findings on limited assessment.  Upper chest: Visualized lung apices are clear.  Review of the MIP images confirms the above findings  CTA HEAD FINDINGS  Anterior circulation: Bilateral intracranial ICAs, MCAs, and ACAs are patent without proximal hemodynamically significant stenosis.  Posterior circulation: Bilateral intradural vertebral arteries, basilar artery and bilateral posterior cerebral arteries are patent without proximal hemodynamically significant stenosis.  Venous sinuses: As permitted by contrast timing, patent.  Review of the MIP images confirms the above findings  IMPRESSION: No emergent large vessel occlusion or proximal hemodynamically significant stenosis.  Electronically Signed   By: FMargaretha SheffieldM.D.   On: 06/30/2022 11:09 CT HEAD CODE STROKE WO CONTRAST CLINICAL DATA:  Code stroke.  Right-sided weakness  EXAM: CT HEAD WITHOUT CONTRAST  TECHNIQUE: Contiguous axial images were obtained from the base of the skull through the vertex without intravenous contrast.  RADIATION DOSE REDUCTION: This exam was performed according  to the departmental dose-optimization program which includes automated exposure control, adjustment of the mA and/or kV according to patient size and/or use of iterative reconstruction technique.  COMPARISON:  None Available.  FINDINGS: Brain: No hemorrhage. No hydrocephalus. No extra-axial fluid collection.  Vascular: Possible hyperdense left MCA versus artifact.  Skull: Normal. Negative for fracture or focal lesion.  Sinuses/Orbits: No middle ear effusion. Trace right mastoid effusion. Polypoid mucosal thickening bilateral maxillary sinuses. Orbits are unremarkable.  Other: None  ASPECTS ([place]roke Program Early CT Score): 10  IMPRESSION: 1. No acute intracranial hemorrhage. Aspects is 10. 2. Possible hyperdense left MCA versus artifact. Recommend  CTA.  Findings were paged to Dr. Quinn Axe on 06/30/22 at 10:46 AM via AMION paginig system.  Electronically Signed   By: Marin Roberts M.D.   On: 06/30/2022 10:46  Lab Results  Component Value Date   WBC 5.5 06/30/2022   HGB 14.4 06/30/2022   HCT 42.6 06/30/2022   MCV 103.6 (H) 06/30/2022   PLT 144 (L) 99991111   Last metabolic panel Lab Results  Component Value Date   GLUCOSE 103 (H) 06/30/2022   NA 139 06/30/2022   K 2.3 (LL) 06/30/2022   CL 98 06/30/2022   CO2 26 06/30/2022   BUN 13 06/30/2022   CREATININE 0.74 06/30/2022   GFRNONAA >60 06/30/2022   CALCIUM 9.3 06/30/2022   PHOS 2.4 (L) 12/30/2021   PROT 6.9 06/30/2022   ALBUMIN 3.9 06/30/2022   BILITOT 0.7 06/30/2022   ALKPHOS 81 06/30/2022   AST 56 (H) 06/30/2022   ALT 23 06/30/2022   ANIONGAP 15 06/30/2022    Assessment and Plan: * TIA (transient ischemic attack) Patient with new onset right-sided weakness and confusion since around 8 AM that progressively resolved upon presentation to the ER Code stroke called with normal CT of the head, CTA head and neck,  as well as MRI of the brain Neurology formally consulted with Dr. Quinn Axe Recommend TIA  evaluation including 2D echo and EEG Started on dual antiplatelet therapy with aspirin Plavix x 21 days with monotherapy thereafter with aspirin High dose statin  Neuromonitoring per protocol Risk stratification labs Appreciate neurology input Follow  Hypokalemia K2.3 on presentation Mag level 1.6 Acute on chronic issue in setting of chronic alcohol abuse Replete Check mag level Follow Discussed alcohol cessation at length  Alcohol abuse 1 pint of liquor drink daily Alcohol level 164 Discussed cessation at length Will place on CIWA protocol Supplement with thiamine, folate, multivitamin Follow    Thrombocytopenia (HCC) Platelets in 140s on presentation in the setting of chronic alcohol use INR within normal limits Monitor Hold anticoagulation if downtrending  Chronic obstructive pulmonary disease (COPD) (HCC) Stable from respiratory standpoint Continue home inhalers Follow  HTN (hypertension) BP stable Titrate home regimen Okay for normotensive BPs as MRI neuroimaging within normal limits Avoid hypotension/hypoperfusion  Follow  Anxiety and depression Continue Prozac  Tobacco abuse 1 pack/day smoker Discussed cessation Start nicotine patch  Gastroesophageal reflux disease PPI      Advance Care Planning:   Code Status: Prior   Consults: Neurology w/ Dr. Quinn Axe   Family Communication: Wife at the bedside   Severity of Illness: The appropriate patient status for this patient is OBSERVATION. Observation status is judged to be reasonable and necessary in order to provide the required intensity of service to ensure the patient's safety. The patient's presenting symptoms, physical exam findings, and initial radiographic and laboratory data in the context of their medical condition is felt to place them at decreased risk for further clinical deterioration. Furthermore, it is anticipated that the patient will be medically stable for discharge from the hospital  within 2 midnights of admission.   Author: Deneise Lever, MD 06/30/2022 4:36 PM  For on call review [email or web address].

## 2022-06-30 NOTE — Assessment & Plan Note (Signed)
Stable from respiratory standpoint Continue home inhalers Follow

## 2022-06-30 NOTE — ED Provider Notes (Signed)
[facility] Provider Note    Event Date/Time   First MD Initiated Contact with Patient 06/30/22 1046     (approximate)   History   No chief complaint on file.   HPI  Darrell Santiago is a 57 y.o. male with EtOH abuse who comes in with concerns for altered mental status.  Patient reports drinking alcohol.  He reports that he last drank around 8 PM.  His wife does collaborate that he is a daily drinker drinks about a pint a day.  He does have issues with hypokalemia in the past.  He reportedly went to work and was acting his normal self and they were noted around 9 AM that he seemed more disoriented may be some questionable right sided decreased grip strength.  Patient initially refused to go to the emergency room but then was more willing to go.  He denies any chest pain, shortness of breath, abdominal pain.     Physical Exam   Triage Vital Signs: ED Triage Vitals  Enc Vitals Group     BP      Pulse      Resp      Temp      Temp src      SpO2      Weight      Height      Head Circumference      Peak Flow      Pain Score      Pain Loc      Pain Edu?      Excl. in [place]?     Most recent vital signs: Vitals:   06/30/22 1430 06/30/22 1510  BP: (!) 149/87   Pulse: 74   Resp: 19   Temp:  98.2 F (36.8 C)  SpO2: 100%      General: Awake, no distress.  CV:  Good peripheral perfusion.  Resp:  Normal effort.  Abd:  No distention.  Other:  NIHSS 4- done with Neuro at bedside    ED Results / Procedures / Treatments   Labs (all labs ordered are listed, but only abnormal results are displayed) Labs Reviewed  CBG MONITORING, ED - Abnormal; Notable for the following components:      Result Value   Glucose-Capillary 129 (*)    All other components within normal limits  PROTIME-INR  APTT  CBC  DIFFERENTIAL  COMPREHENSIVE METABOLIC PANEL  ETHANOL  I-STAT CREATININE, ED  CBG MONITORING, ED     EKG  My interpretation of EKG:  Normal  sinus rhythm 70 following ST elevation or T wave inversions, RBBB  RADIOLOGY I have reviewed the CT head personally interpreted no evidence of intracranial hemorrhage   PROCEDURES:  Critical Care performed: yes   .1-3 Lead EKG Interpretation  Performed by: Vanessa [place], MD Authorized by: Vanessa Bertie, MD     Interpretation: normal     ECG rate:  70   ECG rate assessment: normal     Rhythm: sinus rhythm     Ectopy: none     Conduction: normal   .Critical Care  Performed by: Vanessa [place], MD Authorized by: Vanessa , MD   Critical care provider statement:    Critical care time (minutes):  30   Critical care was necessary to treat or prevent imminent or life-threatening deterioration of the following conditions:  CNS failure or compromise   Critical care was time spent personally by me on the following activities:  Development of treatment plan with patient or surrogate, discussions with consultants, evaluation of patient's response to treatment, examination of patient, ordering and review of laboratory studies, ordering and review of radiographic studies, ordering and performing treatments and interventions, pulse oximetry, re-evaluation of patient's condition and review of old charts    [place] ED: Medications  sodium chloride flush (NS) 0.9 % injection 3 mL (has no administration in time range)  iohexol (OMNIPAQUE) 350 MG/ML injection 75 mL (has no administration in time range)     IMPRESSION / MDM / [place] / ED COURSE  I reviewed the triage vital signs and the nursing notes.   Patient's presentation is most consistent with acute presentation with potential threat to life or bodily function.   Patient comes in as a stroke code.  Stroke code called from EMS.  On evaluation symptoms seem to be resolving and therefore TNK was not given.  Differential does include intracranial hemorrhage, Electra abnormalities, EtOH use, stroke.  CT head  was concerning for possible hyperdense left MCA versus artifact recommended CTA MRI negative of  Labs show low potassium and low magnesium.  Discussed with Dr. Quinn Axe from neurology recommend admission for TIA.  Discussed with patient and he is willing to do this  The patient is on the cardiac monitor to evaluate for evidence of arrhythmia and/or significant heart rate changes.      FINAL CLINICAL IMPRESSION(S) / ED DIAGNOSES   Final diagnoses:  TIA (transient ischemic attack)  Hypomagnesemia  Hypokalemia  ETOH abuse     Rx / DC Orders   ED Discharge Orders     None        Note:  This document was prepared using Dragon voice recognition software and may include unintentional dictation errors.   Vanessa Nash, MD 06/30/22 [phone]

## 2022-06-30 NOTE — Assessment & Plan Note (Signed)
BP stable Titrate home regimen Okay for normotensive BPs as MRI neuroimaging within normal limits Avoid hypotension/hypoperfusion  Follow

## 2022-06-30 NOTE — Assessment & Plan Note (Signed)
1 pint of liquor drink daily Alcohol level 164 Discussed cessation at length Will place on CIWA protocol Supplement with thiamine, folate, multivitamin Follow

## 2022-06-30 NOTE — Assessment & Plan Note (Signed)
Continue Prozac

## 2022-06-30 NOTE — Consult Note (Signed)
CODE STROKE- PHARMACY COMMUNICATION   Time CODE STROKE called/page received:1035  Time response to CODE STROKE was made (in person or via phone): in person  Time Stroke Kit retrieved from [place] (only if needed):n/a  not needed @ 1048  Name of Provider/Nurse contacted: Dr Concepcion Living  Past Medical History:  Diagnosis Date   Abnormal EKG    Allergy    Seasonal   Anxiety    Chicken pox    COPD (chronic obstructive pulmonary disease) ([place])    COVID-19    04/20/21   Depression    GERD (gastroesophageal reflux disease)    Hyperlipidemia    Hypertension    Urine incontinence    Prior to Admission medications   Medication Sig Start Date End Date Taking? Authorizing Provider  acetaminophen (TYLENOL) 325 MG tablet Take 2 tablets (650 mg total) by mouth every 6 (six) hours as needed for mild pain (or Fever >/= 101). 07/26/18   Gladstone Lighter, MD  amLODipine (NORVASC) 2.5 MG tablet Take 1 tablet (2.5 mg total) by mouth daily. 12/22/21   McLean-Scocuzza, Nino Glow, MD  aspirin 81 MG chewable tablet Chew 1 tablet (81 mg total) by mouth daily. 12/22/21   McLean-Scocuzza, Nino Glow, MD  budesonide-formoterol (SYMBICORT) 160-4.5 MCG/ACT inhaler Inhale 2 puffs into the lungs 2 (two) times daily. Rinse mouth after use 12/22/21   McLean-Scocuzza, Nino Glow, MD  folic acid (FOLVITE) 1 MG tablet Take 1 tablet (1 mg total) by mouth daily. Patient not taking: Reported on 11/11/2021 05/01/21   Regalado, Jerald Kief A, MD  hydrOXYzine (ATARAX/VISTARIL) 25 MG tablet TAKE 1 TABLET BY MOUTH DAILY AS NEEDED. 03/10/21   McLean-Scocuzza, Nino Glow, MD  ipratropium-albuterol (DUONEB) 0.5-2.5 (3) MG/3ML SOLN Inhale 3 mLs into the lungs every 6 (six) hours as needed. 12/22/21   McLean-Scocuzza, Nino Glow, MD  LORazepam (ATIVAN) 0.5 MG tablet Take 0.5 tablets (0.25 mg total) by mouth daily as needed for anxiety or sleep. 12/22/21   McLean-Scocuzza, Nino Glow, MD  magnesium oxide (MAG-OX) 400 MG tablet Take 1 tablet (400 mg total) by mouth daily.  12/30/21   McLean-Scocuzza, Nino Glow, MD  Multiple Vitamin (MULTI-VITAMIN) tablet Take 1 tablet by mouth daily. 12/22/21   McLean-Scocuzza, Nino Glow, MD  pantoprazole (PROTONIX) 20 MG tablet Take 1 tablet (20 mg total) by mouth daily. 30 minutes before food 12/22/21   McLean-Scocuzza, Nino Glow, MD  potassium chloride SA (KLOR-CON M) 20 MEQ tablet Take 1 tablet (20 mEq total) by mouth 2 (two) times daily. 12/22/21   McLean-Scocuzza, Nino Glow, MD  pravastatin (PRAVACHOL) 40 MG tablet Take 1 tablet (40 mg total) by mouth at bedtime. 12/22/21   McLean-Scocuzza, Nino Glow, MD  sertraline (ZOLOFT) 50 MG tablet Take 1 tablet (50 mg total) by mouth daily at 12 noon. Note tablet changed from 1/2 of 100 mg to 50 mg qd Patient taking differently: Take 50 mg by mouth daily at 12 noon. 12/22/21   McLean-Scocuzza, Nino Glow, MD  thiamine (VITAMIN B-1) 100 MG tablet Take 1 tablet (100 mg total) by mouth daily. 01/01/22   Enzo Bi, MD  vitamin B-12 (CYANOCOBALAMIN) 1000 MCG tablet Take 1,000 mcg by mouth daily.     [provider]    Ebert Forrester Rodriguez-Guzman PharmD, BCPS 06/30/2022 11:25 AM

## 2022-06-30 NOTE — ED Notes (Signed)
Called out stating he needed to use the restroom, this RN to bedside. Let side rail down and attempted to assist pt to stand, told this RN "you can get out!". This RN informed pt I wanted to be sure he was able to safely transfer out of stretcher and he stated "I'll be fine".

## 2022-06-30 NOTE — Assessment & Plan Note (Signed)
PPI ?

## 2022-06-30 NOTE — Plan of Care (Signed)
  Problem: Education: Goal: Knowledge of disease or condition will improve Outcome: Progressing Goal: Knowledge of secondary prevention will improve (MUST DOCUMENT ALL) Outcome: Progressing Goal: Knowledge of patient specific risk factors will improve Darrell Santiago N/A or DELETE if not current risk factor) Outcome: Progressing   Problem: Ischemic Stroke/TIA Tissue Perfusion: Goal: Complications of ischemic stroke/TIA will be minimized Outcome: Progressing   Problem: Coping: Goal: Will verbalize positive feelings about self Outcome: Progressing   Problem: Health Behavior/Discharge Planning: Goal: Ability to manage health-related needs will improve Outcome: Progressing   Problem: Health Behavior/Discharge Planning: Goal: Ability to manage health-related needs will improve Outcome: Progressing   Problem: Nutrition: Goal: Adequate nutrition will be maintained Outcome: Progressing   Problem: Coping: Goal: Level of anxiety will decrease Outcome: Progressing   Problem: Pain Managment: Goal: General experience of comfort will improve Outcome: Progressing   Problem: Safety: Goal: Ability to remain free from injury will improve Outcome: Progressing   Problem: Skin Integrity: Goal: Risk for impaired skin integrity will decrease Outcome: Progressing

## 2022-06-30 NOTE — ED Triage Notes (Signed)
Pt to ED via [facility]. Ems called due to AMS and right sided weakness. EMS reports LKW 9am. Code stroke activated by Charge RN on arrival to ED and pt taken to CT. Pt with no hx CVA and is not on blood thinners. [place]  Wife reports etoh use of a pint of liquor per day with last use being last pm.

## 2022-06-30 NOTE — Assessment & Plan Note (Signed)
1 pack/day smoker Discussed cessation Start nicotine patch

## 2022-06-30 NOTE — Progress Notes (Signed)
Chaplain Responded to Code Stroke, Pt wife in the room and initially very scared of bad news. Pt in CT. Introduced Spiritual & emotional care support to family. Wife narrated her life with Pt and requested a prayer for peace and healing. This Chaplain offered a prayer, compassionate presence and a calming presence.     06/30/22 1600  Spiritual Encounters  Type of Visit Initial  Care provided to: [facility] partners present during encounter Nurse  Referral source Code page  Reason for visit Code  OnCall Visit Yes  Interventions  Spiritual Care Interventions Made Established relationship of care and support;Compassionate presence;Prayer  Intervention Outcomes  Outcomes Connection to spiritual care;Awareness of support;Reduced isolation;Reduced fear  Spiritual Care Plan  Spiritual Care Issues Still Outstanding No further spiritual care needs at this time (see row info)

## 2022-06-30 NOTE — Assessment & Plan Note (Signed)
Platelets in 140s on presentation in the setting of chronic alcohol use INR within normal limits Monitor Hold anticoagulation if downtrending

## 2022-06-30 NOTE — Assessment & Plan Note (Addendum)
Patient with new onset right-sided weakness and confusion since around 8 AM that progressively resolved upon presentation to the ER Code stroke called with normal CT of the head, CTA head and neck,  as well as MRI of the brain Neurology formally consulted with Dr. Quinn Axe Recommend TIA evaluation including 2D echo and EEG Started on dual antiplatelet therapy with aspirin Plavix x 21 days with monotherapy thereafter with aspirin High dose statin  Neuromonitoring per protocol Risk stratification labs Appreciate neurology input Follow

## 2022-06-30 NOTE — Assessment & Plan Note (Signed)
Patient with new onset right-sided weakness and confusion since around 8 AM that progressively resolved upon presentation to the ER Code stroke called with normal CT of the head as well as MRI of the brain Neurology formally consulted with Dr. Quinn Axe Recommend TIA evaluation including 2D echo and EEG Started on dual antiplatelet therapy with aspirin Plavix x 21 days with monotherapy thereafter with aspirin Neuromonitoring per protocol Risk stratification labs Appreciate neurology input Follow

## 2022-06-30 NOTE — Progress Notes (Signed)
Code Stroke cart activated '@1031'$ . Pt transported to CT prior to activation. LKWT '@0900'$ . Presented with AMS and right-sided weakness. Dr. Quinn Axe paged '@1035'$  and in CT '@1038'$ . TSRN received call from Dr. Quinn Axe '@1048'$ - no TNK. Telestroke Therapist, sports

## 2022-06-30 NOTE — Code Documentation (Addendum)
Stroke Response Nurse Documentation Code Documentation  Darrell Santiago is a 57 y.o. male arriving to [facility] via [facility] on 06/30/2022 with past medical hx of GERD, HTN, HLD, COPD, current smoker, ETOH use. On No antithrombotic. Code stroke was activated by ED.   Patient from work where he was LKW at 0900 per EMS. Upon further discussion, LKW unclear. and now complaining of AMS and right weakness. Patient is from work where he reports waking up this morning, went to work, and had acute onset confusion, endorsing that he "didn't feel good" and "couldn't think." His employer called EMS.     Stroke team meets patient in CT after code stroke activation. Labs drawn and patient cleared for CT by Dr. Myrene Buddy. Patient to CT with team. NIHSS 4, see documentation for details and code stroke times. Patient with left hemianopia, right leg weakness, right decreased sensation, and dysarthria  on exam. The following imaging was completed:  CT Head and CTA. Patient is not a candidate for IV Thrombolytic due to unclear LKW. Patient is not a candidate for IR due to no LVO seen on imaging.   Care Plan: Q2 hour NIHSS and vital signs.  Bedside handoff with ED RN Lovena Le.    Charise Carwin  Stroke Response RN

## 2022-06-30 NOTE — Assessment & Plan Note (Signed)
BP stable Titrate home regimen Okay for normotensive BPs as MRI neuroimaging within normal limits Follow

## 2022-06-30 NOTE — ED Notes (Signed)
Telestroke cart activated in [place], this RN spoke to [employer]

## 2022-07-01 ENCOUNTER — Observation Stay (HOSPITAL_COMMUNITY)
Admit: 2022-07-01 | Discharge: 2022-07-01 | Disposition: A | Discharge status: Home / Self Care | Payer: BC Managed Care – PPO | Attending: Neurology | Admitting: Neurology

## 2022-07-01 ENCOUNTER — Observation Stay
Admit: 2022-07-01 | Discharge: 2022-07-01 | Disposition: A | Discharge status: Home / Self Care | Payer: BC Managed Care – PPO | Attending: Neurology | Admitting: Neurology

## 2022-07-01 DIAGNOSIS — Z800 Family history of malignant neoplasm of digestive organs: Secondary | ICD-10-CM | POA: Diagnosis not present

## 2022-07-01 DIAGNOSIS — Z79899 Other long term (current) drug therapy: Secondary | ICD-10-CM | POA: Diagnosis not present

## 2022-07-01 DIAGNOSIS — J449 Chronic obstructive pulmonary disease, unspecified: Secondary | ICD-10-CM | POA: Diagnosis present

## 2022-07-01 DIAGNOSIS — Z809 Family history of malignant neoplasm, unspecified: Secondary | ICD-10-CM | POA: Diagnosis not present

## 2022-07-01 DIAGNOSIS — Y906 Blood alcohol level of 120-199 mg/100 ml: Secondary | ICD-10-CM | POA: Diagnosis present

## 2022-07-01 DIAGNOSIS — D696 Thrombocytopenia, unspecified: Secondary | ICD-10-CM | POA: Diagnosis present

## 2022-07-01 DIAGNOSIS — Z8616 Personal history of COVID-19: Secondary | ICD-10-CM | POA: Diagnosis not present

## 2022-07-01 DIAGNOSIS — E785 Hyperlipidemia, unspecified: Secondary | ICD-10-CM | POA: Diagnosis present

## 2022-07-01 DIAGNOSIS — Z833 Family history of diabetes mellitus: Secondary | ICD-10-CM | POA: Diagnosis not present

## 2022-07-01 DIAGNOSIS — Z7951 Long term (current) use of inhaled steroids: Secondary | ICD-10-CM | POA: Diagnosis not present

## 2022-07-01 DIAGNOSIS — R4182 Altered mental status, unspecified: Secondary | ICD-10-CM

## 2022-07-01 DIAGNOSIS — Z811 Family history of alcohol abuse and dependence: Secondary | ICD-10-CM | POA: Diagnosis not present

## 2022-07-01 DIAGNOSIS — I10 Essential (primary) hypertension: Secondary | ICD-10-CM | POA: Diagnosis present

## 2022-07-01 DIAGNOSIS — G459 Transient cerebral ischemic attack, unspecified: Secondary | ICD-10-CM

## 2022-07-01 DIAGNOSIS — K219 Gastro-esophageal reflux disease without esophagitis: Secondary | ICD-10-CM | POA: Diagnosis present

## 2022-07-01 DIAGNOSIS — F1010 Alcohol abuse, uncomplicated: Secondary | ICD-10-CM | POA: Diagnosis present

## 2022-07-01 DIAGNOSIS — Z7982 Long term (current) use of aspirin: Secondary | ICD-10-CM | POA: Diagnosis not present

## 2022-07-01 DIAGNOSIS — E876 Hypokalemia: Secondary | ICD-10-CM | POA: Diagnosis present

## 2022-07-01 DIAGNOSIS — F419 Anxiety disorder, unspecified: Secondary | ICD-10-CM | POA: Diagnosis present

## 2022-07-01 DIAGNOSIS — H53462 Homonymous bilateral field defects, left side: Secondary | ICD-10-CM | POA: Diagnosis present

## 2022-07-01 DIAGNOSIS — E639 Nutritional deficiency, unspecified: Secondary | ICD-10-CM | POA: Diagnosis present

## 2022-07-01 DIAGNOSIS — F17210 Nicotine dependence, cigarettes, uncomplicated: Secondary | ICD-10-CM | POA: Diagnosis present

## 2022-07-01 LAB — LIPID PANEL
Cholesterol: 190 mg/dL (ref 0–200)
HDL: 96 mg/dL (ref >40)
LDL Cholesterol: 83 mg/dL (ref 0–99)
Total CHOL/HDL Ratio: 2 RATIO
Triglycerides: 55 mg/dL (ref <150)
VLDL: 11 mg/dL (ref 0–40)

## 2022-07-01 LAB — ECHOCARDIOGRAM COMPLETE BUBBLE STUDY
AR max vel: 3.22 cm2
AV Area VTI: 3.27 cm2
AV Area mean vel: 3.56 cm2
AV Mean grad: 3 mmHg
AV Peak grad: 5 mmHg
Ao pk vel: 1.12 m/s
Area-P 1/2: 2.4 cm2
MV VTI: 1.72 cm2
S' Lateral: 3.3 cm

## 2022-07-01 LAB — COMPREHENSIVE METABOLIC PANEL
ALT: 20 U/L (ref 0–44)
AST: 37 U/L (ref 15–41)
Albumin: 3.5 g/dL (ref 3.5–5.0)
Alkaline Phosphatase: 73 U/L (ref 38–126)
Anion gap: 8 (ref 5–15)
BUN: 9 mg/dL (ref 6–20)
CO2: 26 mmol/L (ref 22–32)
Calcium: 8.2 mg/dL — ABNORMAL LOW (ref 8.9–10.3)
Chloride: 103 mmol/L (ref 98–111)
Creatinine, Ser: 0.58 mg/dL — ABNORMAL LOW (ref 0.61–1.24)
GFR, Estimated: >60 mL/min (ref >60)
Glucose, Bld: 110 mg/dL — ABNORMAL HIGH (ref 70–99)
Potassium: 2.6 mmol/L — CL (ref 3.5–5.1)
Sodium: 137 mmol/L (ref 135–145)
Total Bilirubin: 1.5 mg/dL — ABNORMAL HIGH (ref 0.3–1.2)
Total Protein: 6.1 g/dL — ABNORMAL LOW (ref 6.5–8.1)

## 2022-07-01 LAB — URINE DRUG SCREEN, QUALITATIVE (ARMC ONLY)
Amphetamines, Ur Screen: NOT DETECTED
Barbiturates, Ur Screen: NOT DETECTED
Benzodiazepine, Ur Scrn: NOT DETECTED
Cannabinoid 50 Ng, Ur ~~LOC~~: NOT DETECTED
Cocaine Metabolite,Ur ~~LOC~~: NOT DETECTED
MDMA (Ecstasy)Ur Screen: NOT DETECTED
Methadone Scn, Ur: NOT DETECTED
Opiate, Ur Screen: NOT DETECTED
Phencyclidine (PCP) Ur S: NOT DETECTED
Tricyclic, Ur Screen: NOT DETECTED

## 2022-07-01 LAB — PHOSPHORUS: Phosphorus: 2.5 mg/dL (ref 2.5–4.6)

## 2022-07-01 LAB — BASIC METABOLIC PANEL
Anion gap: 8 (ref 5–15)
BUN: 9 mg/dL (ref 6–20)
CO2: 25 mmol/L (ref 22–32)
Calcium: 8.9 mg/dL (ref 8.9–10.3)
Chloride: 104 mmol/L (ref 98–111)
Creatinine, Ser: 0.6 mg/dL — ABNORMAL LOW (ref 0.61–1.24)
GFR, Estimated: >60 mL/min (ref >60)
Glucose, Bld: 117 mg/dL — ABNORMAL HIGH (ref 70–99)
Potassium: 3.1 mmol/L — ABNORMAL LOW (ref 3.5–5.1)
Sodium: 137 mmol/L (ref 135–145)

## 2022-07-01 LAB — TSH: TSH: 3.056 u[IU]/mL (ref 0.350–4.500)

## 2022-07-01 LAB — HEMOGLOBIN A1C
Hgb A1c MFr Bld: 5.1 % (ref 4.8–5.6)
Mean Plasma Glucose: 100 mg/dL

## 2022-07-01 MED ORDER — HYDRALAZINE HCL 50 MG PO TABS
50.0000 mg | ORAL_TABLET | Freq: Four times a day (QID) | ORAL | Status: DC | PRN
  Administered 2022-07-03: 50 mg via ORAL
  Filled 2022-07-01: qty 1

## 2022-07-01 MED ORDER — POTASSIUM CHLORIDE 10 MEQ/100ML IV SOLN
10.0000 meq | INTRAVENOUS | Status: AC
  Administered 2022-07-01 (×4): 10 meq via INTRAVENOUS
  Filled 2022-07-01 (×3): qty 100

## 2022-07-01 MED ORDER — SODIUM CHLORIDE 0.9 % IV SOLN: INTRAVENOUS | Status: AC

## 2022-07-01 MED ORDER — POTASSIUM CHLORIDE CRYS ER 20 MEQ PO TBCR
40.0000 meq | EXTENDED_RELEASE_TABLET | Freq: Once | ORAL | Status: AC
  Administered 2022-07-01: 40 meq via ORAL
  Filled 2022-07-01: qty 2

## 2022-07-01 MED ORDER — AMLODIPINE BESYLATE 5 MG PO TABS
5.0000 mg | ORAL_TABLET | Freq: Every day | ORAL | Status: DC
  Administered 2022-07-01 – 2022-07-03 (×3): 5 mg via ORAL
  Filled 2022-07-01 (×3): qty 1

## 2022-07-01 NOTE — Progress Notes (Signed)
*  PRELIMINARY RESULTS* Echocardiogram 2D Echocardiogram has been performed.  Darrell Santiago 07/01/2022, 1:51 PM

## 2022-07-01 NOTE — Progress Notes (Signed)
Lab called with critical potassium of 2.6 provider informed. Order will be placed

## 2022-07-01 NOTE — Evaluation (Signed)
Occupational Therapy Evaluation Patient Details Name: Darrell Santiago MRN: [id number] DOB: [birth year] Today's Date: 07/01/2022   History of Present Illness [name] is a 57 y.o. male with medical history significant of COPD, alcohol abuse, hyperlipidemia, hypertension, depression presenting with TIA and alcohol abuse.  Patient reports developing significant right-sided weakness as well as confusion around 8 AM at work.  Patient attempted to leave however he was too confused.  EMS was subsequently called.  Patient denies any prior episodes like this.  Does report chronic alcohol use including drinking up to 1 pint of liquor daily.  Also 1 pack/day smoker.   Clinical Impression   Upon entering the room, pt supine in bed and agreeable to OT evaluation. Pt lives a home with wife and reports independence in all aspects of care. Pt works full time in Biomedical engineer. Pt Independently demonstrates self care tasks within hospital room. He pushed IV pole for ambulation of 200' without issue. Pt demonstrates use of cell phone playing card game and reports no issue feeding himself or opening containers. Pt with no need for skilled OT intervention at this time and pt agrees. OT to complete order.      Recommendations for follow up therapy are one component of a multi-disciplinary discharge planning process, led by the attending physician.  Recommendations may be updated based on patient status, additional functional criteria and insurance authorization.   Follow Up Recommendations  No OT follow up           Functional Status Assessment  Patient has not had a recent decline in their functional status  Equipment Recommendations  None recommended by OT       Precautions / Restrictions Precautions Precautions: Fall Restrictions Weight Bearing Restrictions: No      Mobility Bed Mobility Overal bed mobility: Independent                  Transfers Overall transfer level: Independent                         Balance   Sitting-balance support: No upper extremity supported, Feet supported Sitting balance-Leahy Scale: Normal     Standing balance support: During functional activity, No upper extremity supported Standing balance-Leahy Scale: Good                             ADL either performed or assessed with clinical judgement   ADL Overall ADL's : Modified independent                                             Vision Patient Visual Report: No change from baseline              Pertinent Vitals/Pain Pain Assessment Pain Assessment: No/denies pain     Hand Dominance Left   Extremity/Trunk Assessment Upper Extremity Assessment Upper Extremity Assessment: Overall WFL for tasks assessed   Lower Extremity Assessment Lower Extremity Assessment: Defer to PT evaluation RLE Deficits / Details: RLE proprioception and RAMPs intact RLE Sensation: WNL RLE Coordination: WNL LLE Deficits / Details: LLE proprioception and RAMPs intact LLE Sensation: WNL LLE Coordination: WNL   Cervical / Trunk Assessment Cervical / Trunk Assessment: Normal   Communication Communication Communication: No difficulties   Cognition Arousal/Alertness: Awake/alert Behavior During Therapy: WFL for tasks  assessed/performed Overall Cognitive Status: Within Functional Limits for tasks assessed                                 General Comments: Pleasant and cooperative. Follows commands.     General Comments  HR: 70 BPM at rest            [place] expects to be discharged to:: Private residence Living Arrangements: Spouse/significant other Available Help at Discharge: Family;Available PRN/intermittently Type of Home: House Home Access: Level entry     Home Layout: Multi-level;Able to live on main level with bedroom/bathroom         Bathroom Toilet: Standard Bathroom Accessibility: Yes   Home  Equipment: Cane - single point;Crutches   Additional Comments: Reports he rarely goes onto second story of his home.      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                 OT Goals(Current goals can be found in the care plan section) Acute Rehab OT Goals Patient Stated Goal: to go home OT Goal Formulation: With patient Time For Goal Achievement: 07/01/22 Potential to Achieve Goals: Good  OT Frequency:         AM-PAC OT "6 Clicks" Daily Activity     Outcome Measure Help from another person eating meals?: None Help from another person taking care of personal grooming?: None Help from another person toileting, which includes using toliet, bedpan, or urinal?: None Help from another person bathing (including washing, rinsing, drying)?: None Help from another person to put on and taking off regular upper body clothing?: None Help from another person to put on and taking off regular lower body clothing?: None 6 Click Score: 24   End of Session Nurse Communication: Mobility status  Activity Tolerance: Patient tolerated treatment well Patient left: Other (comment) (hand off to PT for evaluation)                   Time: [id number] OT Time Calculation (min): 11 min Charges:  OT General Charges $OT Visit: 1 Visit OT Evaluation $OT Eval Low Complexity: 1 Low  Darleen Crocker, MS, OTR/L , CBIS ascom [phone]  07/01/22, 11:21 AM

## 2022-07-01 NOTE — Evaluation (Signed)
Physical Therapy Evaluation Patient Details Name: Darrell Santiago MRN: [id number] DOB: 12-23-1965 Today's Date: 07/01/2022  History of Present Illness  Darrell Santiago is a 57 y.o. male with medical history significant of COPD, alcohol abuse, hyperlipidemia, hypertension, depression presenting with TIA and alcohol abuse.  Patient reports developing significant right-sided weakness as well as confusion around 8 AM at work.  Patient attempted to leave however he was too confused.  EMS was subsequently called.  Patient denies any prior episodes like this.  Does report chronic alcohol use including drinking up to 1 pint of liquor daily.  Also 1 pack/day smoker.   Clinical Impression  Pt admitted with above diagnosis. Pt received ambulating with OT in hallway. Pt returning to sitting EOB with OT handing pt off for PT eval. Pt is amenable. Reports at baseline he is indep, still driving and working in Product manager.   To date pt presents with normal sensation to LT in BLE dermatomes with no focal weakness appreciated on RLE's via MMT of myotomes.  Pt with normal RAMP's, coordination, and proprioception standing from EOB independently. Pt completes ~120' of gait with consistent step through reciprocal gait at mildly decreased gait velocity per subjective reports. Pt reports stairs at work and demonstrates ability to safely asc/desc 4 steps in clinic with B rail use how pt would in work setting without difficulty. Pt returning to seated EOB  with all needs in reach. Pt close to baseline level of function with no acute PT needs or no f/u recs necessary. Pt in agreement. PT to sign off.            Recommendations for follow up therapy are one component of a multi-disciplinary discharge planning process, led by the attending physician.  Recommendations may be updated based on patient status, additional functional criteria and insurance authorization.  Follow Up Recommendations No PT follow up      Assistance  Recommended at Discharge PRN  Patient can return home with the following  Assist for transportation    Equipment Recommendations None recommended by PT  Recommendations for Other Services       Functional Status Assessment Patient has had a recent decline in their functional status and demonstrates the ability to make significant improvements in function in a reasonable and predictable amount of time.     Precautions / Restrictions Precautions Precautions: Fall Restrictions Weight Bearing Restrictions: No      Mobility  Bed Mobility               General bed mobility comments: NT Patient Response: Cooperative  Transfers                   General transfer comment: NT. Stand <> sit independent.    Ambulation/Gait Ambulation/Gait assistance: Supervision Gait Distance (Feet): 120 Feet Assistive device: None, IV Pole Gait Pattern/deviations: Step-through pattern       General Gait Details: consistent step through gait without utilizing IV pole. Reports feeling mildly weak but no imbalance.  Stairs Stairs: Yes Stairs assistance: Modified independent (Device/Increase time) Stair Management: One rail Right, One rail Left, Step to pattern, Forwards Number of Stairs: 4 General stair comments: completes steps without difficulty or safety concerns  Wheelchair Mobility    Modified [name] (Stroke Patients Only)       Balance Overall balance assessment: Needs assistance Sitting-balance support: No upper extremity supported, Feet supported Sitting balance-Leahy Scale: Normal     Standing balance support: During functional activity, No upper extremity supported  Standing balance-Leahy Scale: Good                               Pertinent Vitals/Pain Pain Assessment Pain Assessment: No/denies pain    Home Living Family/patient expects to be discharged to:: Private residence Living Arrangements: Spouse/significant other Available Help at  Discharge: Family;Available PRN/intermittently Type of Home: House Home Access: Level entry       Home Layout: Multi-level;Able to live on main level with bedroom/bathroom Home Equipment: Cane - single point;Crutches Additional Comments: Reports he rarely goes onto second story of his home.    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        Extremity/Trunk Assessment   Upper Extremity Assessment Upper Extremity Assessment: Defer to OT evaluation    Lower Extremity Assessment Lower Extremity Assessment: Generalized weakness;RLE deficits/detail;LLE deficits/detail RLE Deficits / Details: RLE proprioception and RAMPs intact RLE Sensation: WNL RLE Coordination: WNL LLE Deficits / Details: LLE proprioception and RAMPs intact LLE Sensation: WNL LLE Coordination: WNL    Cervical / Trunk Assessment Cervical / Trunk Assessment: Normal  Communication   Communication: No difficulties  Cognition Arousal/Alertness: Awake/alert Behavior During Therapy: WFL for tasks assessed/performed Overall Cognitive Status: Within Functional Limits for tasks assessed                                 General Comments: Pleasant and cooperative. Follows commands.        General Comments General comments (skin integrity, edema, etc.): HR: 70 BPM at rest    Exercises Other Exercises Other Exercises: Role of PT in acute setting   Assessment/Plan    PT Assessment Patient does not need any further PT services  PT Problem List         PT Treatment Interventions      PT Goals (Current goals can be found in the Care Plan section)  Acute Rehab PT Goals Patient Stated Goal: to return to work PT Goal Formulation: With patient Time For Goal Achievement: 07/15/22 Potential to Achieve Goals: Good    Frequency       Co-evaluation               AM-PAC  PT "6 Clicks" Mobility  Outcome Measure Help needed turning from your back to your side while in a flat bed without using bedrails?: None Help needed moving from lying on your back to sitting on the side of a flat bed without using bedrails?: None Help needed moving to and from a bed to a chair (including a wheelchair)?: A Little Help needed standing up from a chair using your arms (e.g., wheelchair or bedside chair)?: None Help needed to walk in hospital room?: A Little Help needed climbing 3-5 steps with a railing? : A Little 6 Click Score: 21    End of Session Equipment Utilized During Treatment: Gait belt Activity Tolerance: Patient tolerated treatment well Patient left: in bed;with bed alarm set;with family/visitor present Nurse Communication: Mobility status      Time: [id number] PT Time Calculation (min) (ACUTE ONLY): 12 min   Charges:   PT Evaluation $PT Eval Low Complexity: Dorrington M. Fairly IV, PT, DPT  Physical Therapist- [facility]  07/01/2022, 10:33 AM

## 2022-07-01 NOTE — Progress Notes (Signed)
[facility] Progress Note  Patient: Darrell Santiago    [id number]  DOA: 06/30/2022     Date of Service: the patient was seen and examined on 07/01/2022  Chief Complaint  Patient presents with   Code Stroke   Brief hospital course: LADARRIUS LIPINSKI is a 57 y.o. male with medical history significant of COPD, alcohol abuse, hyperlipidemia, hypertension, depression presenting with TIA and alcohol abuse.  Patient reports developing significant right-sided weakness as well as confusion around 8 AM at work.  Patient attempted to leave however he was too confused.  EMS was subsequently called.  Patient denies any prior episodes like this.  Does report chronic alcohol use including drinking up to 1 pint of liquor daily.  Also 1 pack/day smoker.  No fevers or chills.  No chest pain or shortness of breath.  No abdominal pain.  No nausea or vomiting.  No reported vision changes or headache.  Symptoms persisted over the course of evaluation including upon EMS assessment.  Reports compliant with home medications including antihypertensive. Presented to the ER afebrile, hemodynamically stable.  Code stroke called with NIH score of 4.  CT head within normal limits.  MRI of the brain also obtained that was grossly within normal limits.  Neurology formally consulted with Dr. Quinn Axe.  Recommended TIA evaluation.  Started on aspirin and Plavix in the ER.  Notable labs include hemoglobin 14.4, platelet count of 144, INR of 1.  Alcohol level 164.  Potassium level 2.3.  Mag level of 1.6.   Assessment and Plan:  # TIA (transient ischemic attack) Patient with new onset right-sided weakness and confusion since around 8 AM that progressively resolved upon presentation to the ER Code stroke called with normal CT of the head, CTA head and neck,  as well as MRI of the brain Neurology formally consulted with Dr. Quinn Axe Recommend TIA evaluation including 2D echo and EEG Started on dual antiplatelet therapy with aspirin  Plavix x 21 days with monotherapy thereafter with aspirin High dose statin  TSH 3.0 and A1c 5.1 wnl Neuromonitoring per protocol Risk stratification labs Appreciate neurology input Follow   Hypokalemia most likely nutritional deficiency Potassium repleted Hypomagnesemia, most likely nutritional deficiency Mag repleted Monitor electrolytes and replete as needed.    Alcohol abuse 1 pint of liquor drink daily Alcohol level 164 Discussed cessation at length Continue CIWA protocol, watch for withdrawal symptoms Supplement with thiamine, folate, multivitamin UDS negative Follow       Thrombocytopenia (HCC) Platelets in 140s on presentation in the setting of chronic alcohol use INR within normal limits Monitor Hold anticoagulation if downtrending   Chronic obstructive pulmonary disease (COPD) (HCC) Stable from respiratory standpoint Continue home inhalers Follow   HTN (hypertension) BP stable Titrate home regimen Okay for normotensive BPs as MRI neuroimaging within normal limits Avoid hypotension/hypoperfusion  Follow   Anxiety and depression Continue Prozac   Tobacco abuse 1 pack/day smoker Discussed cessation Start nicotine patch   Gastroesophageal reflux disease PPI   Body mass index is 25.95 kg/m.  Interventions:     Diet: Heart healthy diet DVT Prophylaxis: SCD, pharmacological prophylaxis contraindicated due to low platelets and patient is on dual antibiotic therapy    Advance goals of care discussion: Full code  Family Communication: family was present at bedside, at the time of interview.  The pt provided permission to discuss medical plan with the family. Opportunity was given to ask question and all questions were answered satisfactorily.   Disposition:  Pt is  from Home, admitted with TIA and low K, still has low potassium, which precludes a safe discharge. Discharge to home, when clinically stable, most likely tomorrow a.m.  Subjective: No  significant events overnight, patient was admitted due to right-sided weakness that, symptoms has been resolved.  No new neurological complaints, no headache or dizziness, no chest pain or palpitation, no shortness of breath. Patient was noticed to have hypokalemia, potassium will be repleted.  We will wait for TTE and EEG to be done, most likely discharge tomorrow a.m.  Physical Exam: General: NAD, lying comfortably Appear in no distress, affect appropriate Eyes: PERRLA ENT: Oral Mucosa Clear, moist  Neck: no JVD,  Cardiovascular: S1 and S2 Present, no Murmur,  Respiratory: good respiratory effort, Bilateral Air entry equal and Decreased, no Crackles, no wheezes Abdomen: Bowel Sound present, Soft and no tenderness,  Skin: no rashes Extremities: no Pedal edema, no calf tenderness Neurologic: without any new focal findings Gait not checked due to patient safety concerns  Vitals:   07/01/22 0200 07/01/22 0433 07/01/22 0753 07/01/22 1141  BP: (!) 149/86 135/84 (!) 141/84 133/87  Pulse: 74 73 70 77  Resp:  18 18 18   Temp:   98.3 F (36.8 C) 98.4 F (36.9 C)  TempSrc:   Oral Oral  SpO2:  97% 98% 99%  Weight:      Height:        Intake/Output Summary (Last 24 hours) at 07/01/2022 1409 Last data filed at 07/01/2022 1320 Gross per 24 hour  Intake 1530.03 ml  Output 150 ml  Net 1380.03 ml   Filed Weights   06/30/22 1821  Weight: 79.7 kg    Data Reviewed: I have personally reviewed and interpreted daily labs, tele strips, imagings as discussed above. I reviewed all nursing notes, pharmacy notes, vitals, pertinent old records I have discussed plan of care as described above with RN and patient/family.  CBC: Recent Labs  Lab 06/30/22 1042  WBC 5.5  NEUTROABS 2.7  HGB 14.4  HCT 42.6  MCV 103.6*  PLT 123456*   Basic Metabolic Panel: Recent Labs  Lab 06/30/22 1042 06/30/22 2047 07/01/22 0422  NA 139  --  137  K 2.3* 2.6* 2.6*  CL 98  --  103  CO2 26  --  26  GLUCOSE  103*  --  110*  BUN 13  --  9  CREATININE 0.74  --  0.58*  CALCIUM 9.3  --  8.2*  MG 1.6* 1.7  --   PHOS  --   --  2.5    Studies: No results found.  Scheduled Meds:  amLODipine  5 mg Oral Daily   aspirin EC  81 mg Oral Daily   atorvastatin  80 mg Oral Daily   clopidogrel  75 mg Oral Daily   cyanocobalamin  1,000 mcg Oral Daily   folic acid  1 mg Oral Daily   multivitamin with minerals  1 tablet Oral Daily   nicotine  21 mg Transdermal Daily   pantoprazole  40 mg Oral Daily   sertraline  50 mg Oral Q1200   sodium chloride flush  3 mL Intravenous Once   thiamine  100 mg Oral Daily   Or   thiamine  100 mg Intravenous Daily   Continuous Infusions:  sodium chloride 75 mL/hr at 07/01/22 1320   potassium chloride 10 mEq (07/01/22 1322)   PRN Meds: hydrALAZINE, ipratropium-albuterol, LORazepam **OR** LORazepam  Time spent: 35 minutes  Author: Val Riles. MD [facility] 07/01/2022 2:09 PM  To reach On-call, see care teams to locate the attending and reach out to them via [email or web address]. If 7PM-7AM, please contact night-coverage If you still have difficulty reaching the attending provider, please page the [facility] (Director on Call) for [facility] on amion for assistance.

## 2022-07-01 NOTE — Discharge Instructions (Signed)
                  Intensive Outpatient Programs  [facility]    [facility] [address]     [address] [place],  [place]     [place], [place] [phone]      [phone]  [facility]   [facility]  (Inpatient and outpatient)  [phone] (Suboxone and Methadone) [address]           [phone]           [facility]    [facility] - Intensive Outpatient [address]     [address] [place], [place] [address]     [place], [place]  [phone]      [phone]  [facility] (Outpatient, Inpatient, [facility]  [facility] (Groups and Residental) (insurance only) [phone]    [place], [place]          [phone]       [facility]    [facility] (for caregivers and family) [address]     [address] [place], [place]     [place], [place] [phone]      [phone]  Residential Treatment Programs  [facility]  [facility](2 years) Residential: 90 days)  [facility] [address]      [address] [place], [place]     [place], [place] [phone]      [phone] or [phone]  [facility]    [facility] [address]      [address] [place], [place]     [place], [place] [phone]      [phone]  [facility]   [facility] ([facility] [address]     [address] [place], [place] [address]     [place], [place] [phone]      [phone] Admissions: 8am-3pm M-F  [facility]: Residential Program (90 Days)              [facility]: [facility]  [place], [place]     [place],   [phone] or [phone]    (Walk in Hours over the weekend or by referral)   [facility]: [facility]:[phone] (for crisis  response 24 hours a day) 

## 2022-07-01 NOTE — Procedures (Signed)
Routine EEG Report  Darrell Santiago is a 57 y.o. male with a history of altered mental status who is undergoing an EEG to evaluate for seizures.  Report: This EEG was acquired with electrodes placed according to the International 10-20 electrode system (including Fp1, Fp2, F3, F4, C3, C4, P3, P4, O1, O2, T3, T4, T5, T6, A1, A2, Fz, Cz, Pz). The following electrodes were missing or displaced: none.  The occipital dominant rhythm was 8.5 Hz with intermittent diffuse slowing. This activity is reactive to stimulation. Drowsiness was manifested by background fragmentation; deeper stages of sleep were identified by K complexes and sleep spindles. There was no focal slowing. There were no interictal epileptiform discharges. There were no electrographic seizures identified. Photic stimulation and hyperventilation were not performed.  Impression and clinical correlation: This EEG was obtained while awake and asleep and is abnormal due to mild diffuse slowing indicative of global cerebral dysfunction. Epileptiform abnormalities were not seen during this recording.  Su Monks, MD [facility] [phone]  If 7pm- 7am, please page neurology on call as listed in [place].

## 2022-07-01 NOTE — Progress Notes (Signed)
SLP Cancellation Note  Patient Details Name: Darrell Santiago MRN: [id number] DOB: 06-Jul-1965   Cancelled treatment:       Reason Eval/Treat Not Completed: SLP screened, no needs identified, will sign off   Kalliopi Coupland 07/01/2022, 1:44 PM

## 2022-07-02 DIAGNOSIS — G459 Transient cerebral ischemic attack, unspecified: Secondary | ICD-10-CM | POA: Diagnosis not present

## 2022-07-02 LAB — MAGNESIUM: Magnesium: 1.6 mg/dL — ABNORMAL LOW (ref 1.7–2.4)

## 2022-07-02 LAB — CBC
HCT: 40.5 % (ref 39.0–52.0)
Hemoglobin: 13.9 g/dL (ref 13.0–17.0)
MCH: 35 pg — ABNORMAL HIGH (ref 26.0–34.0)
MCHC: 34.3 g/dL (ref 30.0–36.0)
MCV: 102 fL — ABNORMAL HIGH (ref 80.0–100.0)
Platelets: 115 10*3/uL — ABNORMAL LOW (ref 150–400)
RBC: 3.97 MIL/uL — ABNORMAL LOW (ref 4.22–5.81)
RDW: 13.7 % (ref 11.5–15.5)
WBC: 6.4 10*3/uL (ref 4.0–10.5)
nRBC: 0 % (ref 0.0–0.2)

## 2022-07-02 LAB — BASIC METABOLIC PANEL
Anion gap: 6 (ref 5–15)
BUN: 9 mg/dL (ref 6–20)
CO2: 25 mmol/L (ref 22–32)
Calcium: 8.5 mg/dL — ABNORMAL LOW (ref 8.9–10.3)
Chloride: 102 mmol/L (ref 98–111)
Creatinine, Ser: 0.5 mg/dL — ABNORMAL LOW (ref 0.61–1.24)
GFR, Estimated: >60 mL/min (ref >60)
Glucose, Bld: 108 mg/dL — ABNORMAL HIGH (ref 70–99)
Potassium: 2.7 mmol/L — CL (ref 3.5–5.1)
Sodium: 133 mmol/L — ABNORMAL LOW (ref 135–145)

## 2022-07-02 LAB — NA AND K (SODIUM & POTASSIUM), RAND UR
Potassium Urine: 17 mmol/L
Sodium, Ur: 160 mmol/L

## 2022-07-02 LAB — FOLATE: Folate: 9.8 ng/mL (ref >5.9)

## 2022-07-02 LAB — PHOSPHORUS: Phosphorus: 2.3 mg/dL — ABNORMAL LOW (ref 2.5–4.6)

## 2022-07-02 LAB — VITAMIN B12: Vitamin B-12: 347 pg/mL (ref 180–914)

## 2022-07-02 LAB — OSMOLALITY: Osmolality: 279 mOsm/kg (ref 275–295)

## 2022-07-02 LAB — OSMOLALITY, URINE: Osmolality, Ur: 503 mOsm/kg (ref 300–900)

## 2022-07-02 MED ORDER — K PHOS MONO-SOD PHOS DI & MONO 155-852-130 MG PO TABS
500.0000 mg | ORAL_TABLET | Freq: Three times a day (TID) | ORAL | Status: AC
  Administered 2022-07-02 (×3): 500 mg via ORAL
  Filled 2022-07-02 (×3): qty 2

## 2022-07-02 MED ORDER — MAGNESIUM SULFATE 4 GM/100ML IV SOLN
4.0000 g | Freq: Once | INTRAVENOUS | Status: AC
  Administered 2022-07-02: 4 g via INTRAVENOUS
  Filled 2022-07-02: qty 100

## 2022-07-02 MED ORDER — LOSARTAN POTASSIUM 50 MG PO TABS
50.0000 mg | ORAL_TABLET | Freq: Every evening | ORAL | Status: DC
  Administered 2022-07-02: 50 mg via ORAL
  Filled 2022-07-02: qty 1

## 2022-07-02 MED ORDER — POTASSIUM CHLORIDE 10 MEQ/100ML IV SOLN
10.0000 meq | INTRAVENOUS | Status: AC
  Administered 2022-07-02 (×4): 10 meq via INTRAVENOUS
  Filled 2022-07-02 (×3): qty 100

## 2022-07-02 MED ORDER — POTASSIUM CHLORIDE 10 MEQ/100ML IV SOLN: 10.0000 meq | INTRAVENOUS | Status: DC

## 2022-07-02 MED ORDER — POTASSIUM CHLORIDE CRYS ER 20 MEQ PO TBCR
40.0000 meq | EXTENDED_RELEASE_TABLET | ORAL | Status: AC
  Administered 2022-07-02 (×2): 40 meq via ORAL
  Filled 2022-07-02 (×2): qty 2

## 2022-07-02 MED ORDER — K PHOS MONO-SOD PHOS DI & MONO 155-852-130 MG PO TABS: 500.0000 mg | ORAL_TABLET | Freq: Three times a day (TID) | ORAL | Status: DC

## 2022-07-02 NOTE — Progress Notes (Signed)
[facility] Progress Note  Patient: Darrell Santiago    [id number]  DOA: 06/30/2022     Date of Service: the patient was seen and examined on 07/02/2022  Chief Complaint  Patient presents with   Code Stroke   Brief hospital course: Darrell Santiago is a 57 y.o. male with medical history significant of COPD, alcohol abuse, hyperlipidemia, hypertension, depression presenting with TIA and alcohol abuse.  Patient reports developing significant right-sided weakness as well as confusion around 8 AM at work.  Patient attempted to leave however he was too confused.  EMS was subsequently called.  Patient denies any prior episodes like this.  Does report chronic alcohol use including drinking up to 1 pint of liquor daily.  Also 1 pack/day smoker.  No fevers or chills.  No chest pain or shortness of breath.  No abdominal pain.  No nausea or vomiting.  No reported vision changes or headache.  Symptoms persisted over the course of evaluation including upon EMS assessment.  Reports compliant with home medications including antihypertensive. Presented to the ER afebrile, hemodynamically stable.  Code stroke called with NIH score of 4.  CT head within normal limits.  MRI of the brain also obtained that was grossly within normal limits.  Neurology formally consulted with Dr. Quinn Santiago.  Recommended TIA evaluation.  Started on aspirin and Plavix in the ER.  Notable labs include hemoglobin 14.4, platelet count of 144, INR of 1.  Alcohol level 164.  Potassium level 2.3.  Mag level of 1.6.   Assessment and Plan:  # TIA (transient ischemic attack) Patient with new onset right-sided weakness and confusion since around 8 AM that progressively resolved upon presentation to the ER Code stroke called with normal CT of the head, CTA head and neck,  as well as MRI of the brain Neurology formally consulted with Dr. Quinn Santiago. Recommend TIA evaluation including 2D echo and EEG. Started on dual antiplatelet therapy with aspirin  Plavix x 21 days with monotherapy thereafter with aspirin. High dose statin. TSH 3.0 and A1c 5.1 wnl EEG negative, TTE shows LVEF 55 to 60%, negative PFO, no any other significant findings. Neuromonitoring per protocol Risk stratification labs Appreciate neurology input Follow   Hypokalemia, renal loss, unknown cause TTKG 3 (TTKG =/>3 Hypokalemia suggest renal potassium wasting) Potassium repleted Patient is to follow with nephrology  Hypomagnesemia, most likely nutritional deficiency Mag repleted Hypophosphatemia, Phos repleted Monitor electrolytes and replete as needed.    Alcohol abuse 1 pint of liquor drink daily Alcohol level 164 Discussed cessation at length Continue CIWA protocol, watch for withdrawal symptoms Supplement with thiamine, folate, multivitamin UDS negative Follow       Thrombocytopenia (HCC) Platelets in 140s on presentation in the setting of chronic alcohol use INR within normal limits Monitor Hold anticoagulation if downtrending   Chronic obstructive pulmonary disease (COPD) (HCC) Stable from respiratory standpoint Continue home inhalers Follow   HTN (hypertension) Continued amlodipine 5 mg p.o. daily 3/16 started losartan 50 mg every evening if SBP greater than 130 mmHg We will continue monitor BP and titrate medications accordingly    Anxiety and depression Continue Prozac   Tobacco abuse 1 pack/day smoker Discussed cessation Start nicotine patch   Gastroesophageal reflux disease PPI   Body mass index is 25.95 kg/m.  Interventions:     Diet: Heart healthy diet DVT Prophylaxis: SCD, pharmacological prophylaxis contraindicated due to low platelets and patient is on dual antibiotic therapy    Advance goals of care discussion: Full code  Family Communication: family was present at bedside, at the time of interview.  The pt provided permission to discuss medical plan with the family. Opportunity was given to ask question and all  questions were answered satisfactorily.   Disposition:  Pt is from Home, admitted with TIA and low K, still has low potassium, which precludes a safe discharge. Discharge to home, when clinically stable, most likely tomorrow a.m.  Subjective: No significant events overnight, patient denies any neurological complaints, no headache or dizziness, no chest pain or palpitation no shortness of breath. Patient denied any diarrhea. Still has electrolyte imbalance, we will continue to monitor today and plan for discharge tomorrow a.m.  Physical Exam: General: NAD, lying comfortably Appear in no distress, affect appropriate Eyes: PERRLA ENT: Oral Mucosa Clear, moist  Neck: no JVD,  Cardiovascular: S1 and S2 Present, no Murmur,  Respiratory: good respiratory effort, Bilateral Air entry equal and Decreased, no Crackles, no wheezes Abdomen: Bowel Sound present, Soft and no tenderness,  Skin: no rashes Extremities: no Pedal edema, no calf tenderness Neurologic: without any new focal findings Gait not checked due to patient safety concerns  Vitals:   07/02/22 0025 07/02/22 0520 07/02/22 0840 07/02/22 1133  BP: (!) 144/93 (!) 156/89 131/81 (!) 143/98  Pulse: 72 70 69 75  Resp: 18 16 18 18   Temp: 98.6 F (37 C) 98.2 F (36.8 C) 97.6 F (36.4 C) 98.2 F (36.8 C)  TempSrc: Oral Oral Oral Oral  SpO2: 97% 94% 96% 99%  Weight:      Height:        Intake/Output Summary (Last 24 hours) at 07/02/2022 1349 Last data filed at 07/02/2022 1010 Gross per 24 hour  Intake 720 ml  Output --  Net 720 ml   Filed Weights   06/30/22 1821  Weight: 79.7 kg    Data Reviewed: I have personally reviewed and interpreted daily labs, tele strips, imagings as discussed above. I reviewed all nursing notes, pharmacy notes, vitals, pertinent old records I have discussed plan of care as described above with RN and patient/family.  CBC: Recent Labs  Lab 06/30/22 1042 07/02/22 0554  WBC 5.5 6.4  NEUTROABS  2.7  --   HGB 14.4 13.9  HCT 42.6 40.5  MCV 103.6* 102.0*  PLT 144* [id number]   Basic Metabolic Panel: Recent Labs  Lab 06/30/22 1042 06/30/22 2047 07/01/22 0422 07/01/22 1619 07/02/22 0554  NA 139  --  137 137 133*  K 2.3* 2.6* 2.6* 3.1* 2.7*  CL 98  --  103 104 102  CO2 26  --  26 25 25   GLUCOSE 103*  --  110* 117* 108*  BUN 13  --  9 9 9   CREATININE 0.74  --  0.58* 0.60* 0.50*  CALCIUM 9.3  --  8.2* 8.9 8.5*  MG 1.6* 1.7  --   --  1.6*  PHOS  --   --  2.5  --  2.3*    Studies: ECHOCARDIOGRAM COMPLETE BUBBLE STUDY  Result Date: 07/01/2022    ECHOCARDIOGRAM REPORT   Patient Name:   ASIEL KAMPFER Date of Exam: 07/01/2022 Medical Rec #:  [id number]      Height:       69.0 in Accession #:    [id number]     Weight:       175.7 lb Date of Birth:  08-20-1965      BSA:          1.955 m Patient Age:  56 years       BP:           141/84 mmHg Patient Gender: M              HR:           70 bpm. Exam Location:  [facility] Procedure: 2D Echo, Cardiac Doppler, Color Doppler and Saline Contrast Bubble            Study Indications:     TIA 435.9 / G45.9  History:         Patient has prior history of Echocardiogram examinations, most                  recent 04/27/2021. COPD; Risk Factors:Hypertension and                  Dyslipidemia.  Sonographer:     Sherrie Sport Referring Phys:  [place] Diagnosing Phys: Ida Rogue MD IMPRESSIONS  1. Left ventricular ejection fraction, by estimation, is 55 to 60%. The left ventricle has normal function. The left ventricle has no regional wall motion abnormalities. There is mild left ventricular hypertrophy. Left ventricular diastolic parameters are indeterminate.  2. Right ventricular systolic function is normal. The right ventricular size is normal.  3. The mitral valve is normal in structure. Mild mitral valve regurgitation. No evidence of mitral stenosis.  4. The aortic valve is normal in structure. Aortic valve regurgitation is not visualized. Aortic valve  sclerosis is present, with no evidence of aortic valve stenosis.  5. The inferior vena cava is normal in size with greater than 50% respiratory variability, suggesting right atrial pressure of 3 mmHg. FINDINGS  Left Ventricle: Left ventricular ejection fraction, by estimation, is 55 to 60%. The left ventricle has normal function. The left ventricle has no regional wall motion abnormalities. The left ventricular internal cavity size was normal in size. There is  mild left ventricular hypertrophy. Left ventricular diastolic parameters are indeterminate. Right Ventricle: The right ventricular size is normal. No increase in right ventricular wall thickness. Right ventricular systolic function is normal. Left Atrium: Left atrial size was normal in size. Right Atrium: Right atrial size was normal in size. Pericardium: There is no evidence of pericardial effusion. Mitral Valve: The mitral valve is normal in structure. Mild mitral valve regurgitation. No evidence of mitral valve stenosis. MV peak gradient, 5.2 mmHg. The mean mitral valve gradient is 3.0 mmHg. Tricuspid Valve: The tricuspid valve is normal in structure. Tricuspid valve regurgitation is not demonstrated. No evidence of tricuspid stenosis. Aortic Valve: The aortic valve is normal in structure. Aortic valve regurgitation is not visualized. Aortic valve sclerosis is present, with no evidence of aortic valve stenosis. Aortic valve mean gradient measures 3.0 mmHg. Aortic valve peak gradient measures 5.0 mmHg. Aortic valve area, by VTI measures 3.27 cm. Pulmonic Valve: The pulmonic valve was normal in structure. Pulmonic valve regurgitation is not visualized. No evidence of pulmonic stenosis. Aorta: The aortic root is normal in size and structure. Venous: The inferior vena cava is normal in size with greater than 50% respiratory variability, suggesting right atrial pressure of 3 mmHg. IAS/Shunts: No atrial level shunt detected by color flow Doppler. Agitated  saline contrast was given intravenously to evaluate for intracardiac shunting.  LEFT VENTRICLE PLAX 2D LVIDd:         5.10 cm   Diastology LVIDs:         3.30 cm   LV e' medial:  7.94 cm/s LV PW:         1.10 cm   LV E/e' medial:  13.7 LV IVS:        1.30 cm   LV e' lateral:   8.81 cm/s LVOT diam:     2.10 cm   LV E/e' lateral: 12.4 LV SV:         71 LV SV Index:   36 LVOT Area:     3.46 cm  RIGHT VENTRICLE RV Basal diam:  3.10 cm RV Mid diam:    2.50 cm RV S prime:     12.50 cm/s TAPSE (M-mode): 2.8 cm LEFT ATRIUM             Index        RIGHT ATRIUM           Index LA diam:        3.60 cm 1.84 cm/m   RA Area:     14.50 cm LA Vol (A2C):   51.8 ml 26.49 ml/m  RA Volume:   35.50 ml  18.16 ml/m LA Vol (A4C):   41.5 ml 21.22 ml/m LA Biplane Vol: 47.6 ml 24.34 ml/m  AORTIC VALVE AV Area (Vmax):    3.22 cm AV Area (Vmean):   3.56 cm AV Area (VTI):     3.27 cm AV Vmax:           112.00 cm/s AV Vmean:          71.300 cm/s AV VTI:            0.218 m AV Peak Grad:      5.0 mmHg AV Mean Grad:      3.0 mmHg LVOT Vmax:         104.00 cm/s LVOT Vmean:        73.300 cm/s LVOT VTI:          0.206 m LVOT/AV VTI ratio: 0.94  AORTA Ao Root diam: 3.20 cm MITRAL VALVE                TRICUSPID VALVE MV Area (PHT): 2.40 cm     TR Peak grad:   21.3 mmHg MV Area VTI:   1.72 cm     TR Vmax:        231.00 cm/s MV Peak grad:  5.2 mmHg MV Mean grad:  3.0 mmHg     SHUNTS MV Vmax:       1.14 m/s     Systemic VTI:  0.21 m MV Vmean:      80.6 cm/s    Systemic Diam: 2.10 cm MV Decel Time: 316 msec MV E velocity: 109.00 cm/s MV A velocity: 93.30 cm/s MV E/A ratio:  1.17 Ida Rogue MD Electronically signed by Ida Rogue MD Signature Date/Time: 07/01/2022/4:13:27 PM    Final    EEG adult  Result Date: 07/01/2022 Derek Jack, MD     07/01/2022  3:00 PM Routine EEG Report AWAN SLAVICH is a 57 y.o. male with a history of altered mental status who is undergoing an EEG to evaluate for seizures. Report: This EEG was acquired  with electrodes placed according to the International 10-20 electrode system (including Fp1, Fp2, F3, F4, C3, C4, P3, P4, O1, O2, T3, T4, T5, T6, A1, A2, Fz, Cz, Pz). The following electrodes were missing or displaced: none. The occipital dominant rhythm was 8.5 Hz with intermittent diffuse slowing. This activity is reactive to stimulation. Drowsiness was manifested by background fragmentation;  deeper stages of sleep were identified by K complexes and sleep spindles. There was no focal slowing. There were no interictal epileptiform discharges. There were no electrographic seizures identified. Photic stimulation and hyperventilation were not performed. Impression and clinical correlation: This EEG was obtained while awake and asleep and is abnormal due to mild diffuse slowing indicative of global cerebral dysfunction. Epileptiform abnormalities were not seen during this recording. Su Monks, MD [facility] [phone] If 7pm- 7am, please page neurology on call as listed in [place].    Scheduled Meds:  amLODipine  5 mg Oral Daily   aspirin EC  81 mg Oral Daily   atorvastatin  80 mg Oral Daily   clopidogrel  75 mg Oral Daily   cyanocobalamin  1,000 mcg Oral Daily   folic acid  1 mg Oral Daily   multivitamin with minerals  1 tablet Oral Daily   nicotine  21 mg Transdermal Daily   pantoprazole  40 mg Oral Daily   phosphorus  500 mg Oral TID   potassium chloride  40 mEq Oral Q4H   sertraline  50 mg Oral Q1200   sodium chloride flush  3 mL Intravenous Once   thiamine  100 mg Oral Daily   Or   thiamine  100 mg Intravenous Daily   Continuous Infusions:   PRN Meds: hydrALAZINE, ipratropium-albuterol, LORazepam **OR** LORazepam  Time spent: 35 minutes  Author: Val Riles. MD [facility] 07/02/2022 1:49 PM  To reach On-call, see care teams to locate the attending and reach out to them via [email or web address]. If 7PM-7AM, please contact night-coverage If you still have difficulty  reaching the attending provider, please page the [facility] (Director on Call) for [facility] on amion for assistance.

## 2022-07-03 DIAGNOSIS — G459 Transient cerebral ischemic attack, unspecified: Secondary | ICD-10-CM | POA: Diagnosis not present

## 2022-07-03 LAB — BASIC METABOLIC PANEL
Anion gap: 5 (ref 5–15)
BUN: 12 mg/dL (ref 6–20)
CO2: 25 mmol/L (ref 22–32)
Calcium: 8.4 mg/dL — ABNORMAL LOW (ref 8.9–10.3)
Chloride: 103 mmol/L (ref 98–111)
Creatinine, Ser: 0.54 mg/dL — ABNORMAL LOW (ref 0.61–1.24)
GFR, Estimated: >60 mL/min (ref >60)
Glucose, Bld: 119 mg/dL — ABNORMAL HIGH (ref 70–99)
Potassium: 3.3 mmol/L — ABNORMAL LOW (ref 3.5–5.1)
Sodium: 133 mmol/L — ABNORMAL LOW (ref 135–145)

## 2022-07-03 LAB — CBC
HCT: 41.9 % (ref 39.0–52.0)
Hemoglobin: 14 g/dL (ref 13.0–17.0)
MCH: 34.1 pg — ABNORMAL HIGH (ref 26.0–34.0)
MCHC: 33.4 g/dL (ref 30.0–36.0)
MCV: 102.2 fL — ABNORMAL HIGH (ref 80.0–100.0)
Platelets: 118 10*3/uL — ABNORMAL LOW (ref 150–400)
RBC: 4.1 MIL/uL — ABNORMAL LOW (ref 4.22–5.81)
RDW: 13.7 % (ref 11.5–15.5)
WBC: 7.5 10*3/uL (ref 4.0–10.5)
nRBC: 0 % (ref 0.0–0.2)

## 2022-07-03 LAB — PHOSPHORUS: Phosphorus: 2.9 mg/dL (ref 2.5–4.6)

## 2022-07-03 LAB — MAGNESIUM: Magnesium: 2.1 mg/dL (ref 1.7–2.4)

## 2022-07-03 MED ORDER — PANTOPRAZOLE SODIUM 20 MG PO TBEC: 20.0000 mg | DELAYED_RELEASE_TABLET | Freq: Every day | ORAL | 3 refills | Status: DC

## 2022-07-03 MED ORDER — AMLODIPINE BESYLATE 5 MG PO TABS: 5.0000 mg | ORAL_TABLET | Freq: Every day | ORAL | 11 refills | Status: DC

## 2022-07-03 MED ORDER — CLOPIDOGREL BISULFATE 75 MG PO TABS: 75.0000 mg | ORAL_TABLET | Freq: Every day | ORAL | 0 refills | Status: AC

## 2022-07-03 MED ORDER — ASPIRIN 81 MG PO TBEC: 81.0000 mg | DELAYED_RELEASE_TABLET | Freq: Every day | ORAL | 3 refills | Status: AC

## 2022-07-03 MED ORDER — POTASSIUM CHLORIDE CRYS ER 20 MEQ PO TBCR
40.0000 meq | EXTENDED_RELEASE_TABLET | Freq: Once | ORAL | Status: AC
  Administered 2022-07-03: 40 meq via ORAL
  Filled 2022-07-03: qty 2

## 2022-07-03 MED ORDER — ATORVASTATIN CALCIUM 80 MG PO TABS: 80.0000 mg | ORAL_TABLET | Freq: Every day | ORAL | 5 refills | Status: DC

## 2022-07-03 MED ORDER — LOSARTAN POTASSIUM 50 MG PO TABS: 50.0000 mg | ORAL_TABLET | Freq: Every evening | ORAL | 5 refills | Status: DC

## 2022-07-03 NOTE — Discharge Summary (Signed)
[facility] Discharge Summary   Patient: Darrell Santiago [id number]  PCP: Hoy Register, MD  Date of admission: 06/30/2022   Date of discharge:  07/03/2022     Discharge Diagnoses:  Principal Problem:   TIA (transient ischemic attack) Active Problems:   Hypokalemia   Alcohol abuse   Gastroesophageal reflux disease   Tobacco abuse   Anxiety and depression   HTN (hypertension)   Chronic obstructive pulmonary disease (COPD) ([place])   Thrombocytopenia ([place])   Admitted From: Home Disposition:  Home   Recommendations for Outpatient Follow-up:  Follow-up with PCP in 1 week, repeat BMP after 1 week to check potassium level.  Monitor BP at home and follow with PCP to titrate medication accordingly.  Increased amlodipine from 2.5 to 5 mg p.o. daily and started losartan 50 mg p.o. daily.  CT chest as an outpatient for cancer screening. Follow with neurology in 1 to 2 weeks Follow up LABS/TEST:  as above   Diet recommendation: Cardiac diet  Activity: The patient is advised to gradually reintroduce usual activities, as tolerated  Discharge Condition: stable  Code Status: Full code   History of present illness: As per the H and P dictated on admission Hospital Course:  Darrell Santiago is a 57 y.o. male with medical history significant of COPD, alcohol abuse, hyperlipidemia, hypertension, depression presenting with TIA and alcohol abuse.  Patient reports developing significant right-sided weakness as well as confusion around 8 AM at work.  Patient attempted to leave however he was too confused.  EMS was subsequently called.  Patient denies any prior episodes like this.  Does report chronic alcohol use including drinking up to 1 pint of liquor daily.  Also 1 pack/day smoker.  No fevers or chills.  No chest pain or shortness of breath.  No abdominal pain.  No nausea or vomiting.  No reported vision changes or headache.  Symptoms persisted over the course of evaluation including upon  EMS assessment.  Reports compliant with home medications including antihypertensive. Presented to the ER afebrile, hemodynamically stable.  Code stroke called with NIH score of 4.  CT head within normal limits.  MRI of the brain also obtained that was grossly within normal limits.  Neurology formally consulted with Dr. Quinn Axe.  Recommended TIA evaluation.  Started on aspirin and Plavix in the ER.  Notable labs include hemoglobin 14.4, platelet count of 144, INR of 1.  Alcohol level 164.  Potassium level 2.3.  Mag level of 1.6.  Assessment and Plan: # TIA (transient ischemic attack) Patient with new onset right-sided weakness and confusion since around 8 AM that progressively resolved upon presentation to the ER Code stroke called with normal CT of the head, CTA head and neck,  as well as MRI of the brain. Neurology consulted, Dr. Quinn Axe. Recommend TIA evaluation including 2D echo and EEG. Started on dual antiplatelet therapy with aspirin Plavix x 21 days with monotherapy thereafter with aspirin. High dose statin. TSH 3.0 and A1c 5.1 wnl EEG negative, TTE shows LVEF 55 to 60%, negative PFO, no any other significant findings.  Follow-up with neurology in 1 to 2 weeks # Hypokalemia, renal loss, unknown cause TTKG 3 (TTKG =/>3 Hypokalemia suggest renal potassium wasting) Potassium repleted. D/w nephrology, recommended that hypokalemia most likely due to alcohol use.  Repeat BMP after 1 week and follow-up with PCP. # Hypomagnesemia, most likely nutritional deficiency. Mag repleted. Resolved # Hypophosphatemia, Phos repleted and Resolved  # Alcohol abuse, 1 pint of liquor drink daily,  Alcohol level 164, alcohol abstinence counseling done.  Patient was on CIWA protocol, denies any withdrawal symptoms.  Patient was given thiamine, folate and multivitamin.  UDS negative. # Thrombocytopenia. Platelets in 140s on presentation in the setting of chronic alcohol use. INR within normal limits.  Repeat CBC after 1 to 2  weeks # Chronic obstructive pulmonary disease (COPD) Stable, Continue home inhalers.  Smoking cessation counseling done. # HTN (hypertension) Continued amlodipine 5 mg p.o. daily, 3/16 started losartan 50 mg every evening with parameters.  Continue to monitor BP and follow with PCP to titrate medications accordingly. # Anxiety and depression, Continue Prozac # Tobacco abuse, 1 pack/day smoker, s/p nicotine patch.  Smoking cessation counseling done. Gastroesophageal reflux disease, continue PPI Body mass index is 25.95 kg/m.  Nutrition Interventions:   Patient was ambulatory without any assistance. Patient was seen by physical therapy, who recommended no therapy needed on discharge,. On the day of the discharge the patient's vitals were stable, and no other acute medical condition were reported by patient. the patient was felt safe to be discharge at Home.  Consultants: Neurologist and curbside discussion with nephrology. Procedures: None  Discharge Exam: General: Appear in no distress, no Rash; Oral Mucosa Clear, moist. Cardiovascular: S1 and S2 Present, no Murmur, Respiratory: normal respiratory effort, Bilateral Air entry present and no Crackles, no wheezes Abdomen: Bowel Sound present, Soft and no tenderness, no hernia Extremities: no Pedal edema, no calf tenderness Neurology: alert and oriented to time, place, and person affect appropriate.  Filed Weights   06/30/22 1821  Weight: 79.7 kg   Vitals:   07/03/22 0403 07/03/22 0842  BP: 134/76 130/86  Pulse: 78 80  Resp: 18 18  Temp: 98.2 F (36.8 C) 98.3 F (36.8 C)  SpO2: 96% 96%    DISCHARGE MEDICATION: Allergies as of 07/03/2022   No Known Allergies      Medication List     STOP taking these medications    aspirin 81 MG chewable tablet Replaced by: aspirin EC 81 MG tablet   omeprazole 20 MG capsule Commonly known as: PRILOSEC   pravastatin 40 MG tablet Commonly known as: PRAVACHOL       TAKE these  medications    acetaminophen 325 MG tablet Commonly known as: TYLENOL Take 2 tablets (650 mg total) by mouth every 6 (six) hours as needed for mild pain (or Fever >/= 101).   amLODipine 5 MG tablet Commonly known as: NORVASC Take 1 tablet (5 mg total) by mouth daily. What changed:  medication strength how much to take   aspirin EC 81 MG tablet Take 1 tablet (81 mg total) by mouth daily. Swallow whole. Start taking on: July 04, 2022 Replaces: aspirin 81 MG chewable tablet   atorvastatin 80 MG tablet Commonly known as: LIPITOR Take 1 tablet (80 mg total) by mouth daily. Start taking on: July 04, 2022   budesonide-formoterol 160-4.5 MCG/ACT inhaler Commonly known as: Symbicort Inhale 2 puffs into the lungs 2 (two) times daily. Rinse mouth after use   clopidogrel 75 MG tablet Commonly known as: PLAVIX Take 1 tablet (75 mg total) by mouth daily for 17 days. Start taking on: July 04, 2022   cyanocobalamin 1000 MCG tablet Commonly known as: VITAMIN B12 Take 1,000 mcg by mouth daily.   folic acid 1 MG tablet Commonly known as: FOLVITE Take 1 tablet (1 mg total) by mouth daily.   hydrOXYzine 25 MG tablet Commonly known as: ATARAX TAKE 1 TABLET BY MOUTH DAILY AS  NEEDED.   ipratropium-albuterol 0.5-2.5 (3) MG/3ML Soln Commonly known as: DUONEB Inhale 3 mLs into the lungs every 6 (six) hours as needed.   LORazepam 0.5 MG tablet Commonly known as: ATIVAN Take 0.5 tablets (0.25 mg total) by mouth daily as needed for anxiety or sleep.   losartan 50 MG tablet Commonly known as: COZAAR Take 1 tablet (50 mg total) by mouth every evening.   magnesium oxide 400 MG tablet Commonly known as: MAG-OX Take 1 tablet (400 mg total) by mouth daily.   Multi-Vitamin tablet Take 1 tablet by mouth daily.   pantoprazole 20 MG tablet Commonly known as: PROTONIX Take 1 tablet (20 mg total) by mouth daily. 30 minutes before food   potassium chloride SA 20 MEQ tablet Commonly known  as: KLOR-CON M Take 1 tablet (20 mEq total) by mouth 2 (two) times daily.   sertraline 50 MG tablet Commonly known as: ZOLOFT Take 1 tablet (50 mg total) by mouth daily at 12 noon. Note tablet changed from 1/2 of 100 mg to 50 mg qd What changed: additional instructions   thiamine 100 MG tablet Commonly known as: Vitamin B-1 Take 1 tablet (100 mg total) by mouth daily.       No Known Allergies Discharge Instructions     Call MD for:   Complete by: As directed    Weakness or numbness, headache or dizziness or any neurological changes.   Call MD for:  difficulty breathing, headache or visual disturbances   Complete by: As directed    Call MD for:  extreme fatigue   Complete by: As directed    Call MD for:  persistant dizziness or light-headedness   Complete by: As directed    Call MD for:  severe uncontrolled pain   Complete by: As directed    Diet - low sodium heart healthy   Complete by: As directed    Discharge instructions   Complete by: As directed    Follow-up with PCP in 1 week, repeat BMP after 1 week to check potassium level.  Monitor BP at home and follow with PCP to titrate medication accordingly.  Increased amlodipine from 2.5 to 5 mg p.o. daily and started losartan 50 mg p.o. daily.  CT chest as an outpatient for cancer screening. Follow with neurology in 1 to 2 weeks   Increase activity slowly   Complete by: As directed        The results of significant diagnostics from this hospitalization (including imaging, microbiology, ancillary and laboratory) are listed below for reference.    Significant Diagnostic Studies: ECHOCARDIOGRAM COMPLETE BUBBLE STUDY  Result Date: 07/01/2022    ECHOCARDIOGRAM REPORT   Patient Name:   DNAIEL DEHRING Date of Exam: 07/01/2022 Medical Rec #:  [id number]      Height:       69.0 in Accession #:    [id number]     Weight:       175.7 lb Date of Birth:  07-23-1965      BSA:          1.955 m Patient Age:    57 years       BP:            141/84 mmHg Patient Gender: M              HR:           70 bpm. Exam Location:  [facility] Procedure: 2D Echo, Cardiac Doppler, Color Doppler and Saline Contrast Bubble  Study Indications:     TIA 435.9 / G45.9  History:         Patient has prior history of Echocardiogram examinations, most                  recent 04/27/2021. COPD; Risk Factors:Hypertension and                  Dyslipidemia.  Sonographer:     Sherrie Sport Referring Phys:  [place] Diagnosing Phys: Ida Rogue MD IMPRESSIONS  1. Left ventricular ejection fraction, by estimation, is 55 to 60%. The left ventricle has normal function. The left ventricle has no regional wall motion abnormalities. There is mild left ventricular hypertrophy. Left ventricular diastolic parameters are indeterminate.  2. Right ventricular systolic function is normal. The right ventricular size is normal.  3. The mitral valve is normal in structure. Mild mitral valve regurgitation. No evidence of mitral stenosis.  4. The aortic valve is normal in structure. Aortic valve regurgitation is not visualized. Aortic valve sclerosis is present, with no evidence of aortic valve stenosis.  5. The inferior vena cava is normal in size with greater than 50% respiratory variability, suggesting right atrial pressure of 3 mmHg. FINDINGS  Left Ventricle: Left ventricular ejection fraction, by estimation, is 55 to 60%. The left ventricle has normal function. The left ventricle has no regional wall motion abnormalities. The left ventricular internal cavity size was normal in size. There is  mild left ventricular hypertrophy. Left ventricular diastolic parameters are indeterminate. Right Ventricle: The right ventricular size is normal. No increase in right ventricular wall thickness. Right ventricular systolic function is normal. Left Atrium: Left atrial size was normal in size. Right Atrium: Right atrial size was normal in size. Pericardium: There is no evidence of pericardial  effusion. Mitral Valve: The mitral valve is normal in structure. Mild mitral valve regurgitation. No evidence of mitral valve stenosis. MV peak gradient, 5.2 mmHg. The mean mitral valve gradient is 3.0 mmHg. Tricuspid Valve: The tricuspid valve is normal in structure. Tricuspid valve regurgitation is not demonstrated. No evidence of tricuspid stenosis. Aortic Valve: The aortic valve is normal in structure. Aortic valve regurgitation is not visualized. Aortic valve sclerosis is present, with no evidence of aortic valve stenosis. Aortic valve mean gradient measures 3.0 mmHg. Aortic valve peak gradient measures 5.0 mmHg. Aortic valve area, by VTI measures 3.27 cm. Pulmonic Valve: The pulmonic valve was normal in structure. Pulmonic valve regurgitation is not visualized. No evidence of pulmonic stenosis. Aorta: The aortic root is normal in size and structure. Venous: The inferior vena cava is normal in size with greater than 50% respiratory variability, suggesting right atrial pressure of 3 mmHg. IAS/Shunts: No atrial level shunt detected by color flow Doppler. Agitated saline contrast was given intravenously to evaluate for intracardiac shunting.  LEFT VENTRICLE PLAX 2D LVIDd:         5.10 cm   Diastology LVIDs:         3.30 cm   LV e' medial:    7.94 cm/s LV PW:         1.10 cm   LV E/e' medial:  13.7 LV IVS:        1.30 cm   LV e' lateral:   8.81 cm/s LVOT diam:     2.10 cm   LV E/e' lateral: 12.4 LV SV:         71 LV SV Index:   36 LVOT Area:  3.46 cm  RIGHT VENTRICLE RV Basal diam:  3.10 cm RV Mid diam:    2.50 cm RV S prime:     12.50 cm/s TAPSE (M-mode): 2.8 cm LEFT ATRIUM             Index        RIGHT ATRIUM           Index LA diam:        3.60 cm 1.84 cm/m   RA Area:     14.50 cm LA Vol (A2C):   51.8 ml 26.49 ml/m  RA Volume:   35.50 ml  18.16 ml/m LA Vol (A4C):   41.5 ml 21.22 ml/m LA Biplane Vol: 47.6 ml 24.34 ml/m  AORTIC VALVE AV Area (Vmax):    3.22 cm AV Area (Vmean):   3.56 cm AV Area  (VTI):     3.27 cm AV Vmax:           112.00 cm/s AV Vmean:          71.300 cm/s AV VTI:            0.218 m AV Peak Grad:      5.0 mmHg AV Mean Grad:      3.0 mmHg LVOT Vmax:         104.00 cm/s LVOT Vmean:        73.300 cm/s LVOT VTI:          0.206 m LVOT/AV VTI ratio: 0.94  AORTA Ao Root diam: 3.20 cm MITRAL VALVE                TRICUSPID VALVE MV Area (PHT): 2.40 cm     TR Peak grad:   21.3 mmHg MV Area VTI:   1.72 cm     TR Vmax:        231.00 cm/s MV Peak grad:  5.2 mmHg MV Mean grad:  3.0 mmHg     SHUNTS MV Vmax:       1.14 m/s     Systemic VTI:  0.21 m MV Vmean:      80.6 cm/s    Systemic Diam: 2.10 cm MV Decel Time: 316 msec MV E velocity: 109.00 cm/s MV A velocity: 93.30 cm/s MV E/A ratio:  1.17 Ida Rogue MD Electronically signed by Ida Rogue MD Signature Date/Time: 07/01/2022/4:13:27 PM    Final    EEG adult  Result Date: 07/01/2022 Derek Jack, MD     07/01/2022  3:00 PM Routine EEG Report RENEL KLINGE is a 57 y.o. male with a history of altered mental status who is undergoing an EEG to evaluate for seizures. Report: This EEG was acquired with electrodes placed according to the International 10-20 electrode system (including Fp1, Fp2, F3, F4, C3, C4, P3, P4, O1, O2, T3, T4, T5, T6, A1, A2, Fz, Cz, Pz). The following electrodes were missing or displaced: none. The occipital dominant rhythm was 8.5 Hz with intermittent diffuse slowing. This activity is reactive to stimulation. Drowsiness was manifested by background fragmentation; deeper stages of sleep were identified by K complexes and sleep spindles. There was no focal slowing. There were no interictal epileptiform discharges. There were no electrographic seizures identified. Photic stimulation and hyperventilation were not performed. Impression and clinical correlation: This EEG was obtained while awake and asleep and is abnormal due to mild diffuse slowing indicative of global cerebral dysfunction. Epileptiform abnormalities were  not seen during this recording. Su Monks, MD [facility] [phone] If 7pm-  7am, please page neurology on call as listed in [place].   MR BRAIN WO CONTRAST  Result Date: 06/30/2022 CLINICAL DATA:  Stroke suspected EXAM: MRI HEAD WITHOUT CONTRAST TECHNIQUE: Multiplanar, multiecho pulse sequences of the brain and surrounding structures were obtained without intravenous contrast. COMPARISON:  CT head 06/30/22 FINDINGS: Brain: Negative for an acute infarct. No hemorrhage. No extra-axial fluid collection. No hydrocephalus. Minimal chronic microvascular ischemic change. Vascular: Normal flow voids. Skull and upper cervical spine: Normal marrow signal. Sinuses/Orbits: Trace right mastoid effusion. No middle ear effusion. Polypoid mucosal thickening bilateral maxillary sinuses and trace mucosal thickening in the bilateral ethmoid and frontal sinuses. Orbits are unremarkable. Other: None. IMPRESSION: No acute intracranial process. Electronically Signed   By: Marin Roberts M.D.   On: 06/30/2022 12:47   CT ANGIO HEAD NECK W WO CM (CODE STROKE)  Result Date: 06/30/2022 CLINICAL DATA:  Code stroke. EXAM: CT ANGIOGRAPHY HEAD AND NECK TECHNIQUE: Multidetector CT imaging of the head and neck was performed using the standard protocol during bolus administration of intravenous contrast. Multiplanar CT image reconstructions and MIPs were obtained to evaluate the vascular anatomy. Carotid stenosis measurements (when applicable) are obtained utilizing NASCET criteria, using the distal internal carotid diameter as the denominator. RADIATION DOSE REDUCTION: This exam was performed according to the departmental dose-optimization program which includes automated exposure control, adjustment of the mA and/or kV according to patient size and/or use of iterative reconstruction technique. CONTRAST:  26mL OMNIPAQUE IOHEXOL 350 MG/ML SOLN COMPARISON:  Same day CT head. FINDINGS: CTA NECK FINDINGS Aortic arch: No great  vessel origins are patent without significant disease. Right carotid system: No evidence of dissection, stenosis (50% or greater), or occlusion. Left carotid system: No evidence of dissection, stenosis (50% or greater), or occlusion. Vertebral arteries: Both vertebral arteries are patent without significant (greater than 50%) stenosis. Skeleton: No acute finding on limited assessment. Other neck: No acute findings on limited assessment. Upper chest: Visualized lung apices are clear. Review of the MIP images confirms the above findings CTA HEAD FINDINGS Anterior circulation: Bilateral intracranial ICAs, MCAs, and ACAs are patent without proximal hemodynamically significant stenosis. Posterior circulation: Bilateral intradural vertebral arteries, basilar artery and bilateral posterior cerebral arteries are patent without proximal hemodynamically significant stenosis. Venous sinuses: As permitted by contrast timing, patent. Review of the MIP images confirms the above findings IMPRESSION: No emergent large vessel occlusion or proximal hemodynamically significant stenosis. Electronically Signed   By: Margaretha Sheffield M.D.   On: 06/30/2022 11:09   CT HEAD CODE STROKE WO CONTRAST  Result Date: 06/30/2022 CLINICAL DATA:  Code stroke.  Right-sided weakness EXAM: CT HEAD WITHOUT CONTRAST TECHNIQUE: Contiguous axial images were obtained from the base of the skull through the vertex without intravenous contrast. RADIATION DOSE REDUCTION: This exam was performed according to the departmental dose-optimization program which includes automated exposure control, adjustment of the mA and/or kV according to patient size and/or use of iterative reconstruction technique. COMPARISON:  None Available. FINDINGS: Brain: No hemorrhage. No hydrocephalus. No extra-axial fluid collection. Vascular: Possible hyperdense left MCA versus artifact. Skull: Normal. Negative for fracture or focal lesion. Sinuses/Orbits: No middle ear effusion.  Trace right mastoid effusion. Polypoid mucosal thickening bilateral maxillary sinuses. Orbits are unremarkable. Other: None ASPECTS ([place] Stroke Program Early CT Score): 10 IMPRESSION: 1. No acute intracranial hemorrhage. Aspects is 10. 2. Possible hyperdense left MCA versus artifact. Recommend CTA. Findings were paged to Dr. Quinn Axe on 06/30/22 at 10:46 AM via [employer] paginig system. Electronically Signed   By: Madison Hickman  Shearon Stalls M.D.   On: 06/30/2022 10:46    Microbiology: No results found for this or any previous visit (from the past 240 hour(s)).   Labs: CBC: Recent Labs  Lab 06/30/22 1042 07/02/22 0554 07/03/22 0503  WBC 5.5 6.4 7.5  NEUTROABS 2.7  --   --   HGB 14.4 13.9 14.0  HCT 42.6 40.5 41.9  MCV 103.6* 102.0* 102.2*  PLT 144* 115* [id number]*   Basic Metabolic Panel: Recent Labs  Lab 06/30/22 1042 06/30/22 2047 07/01/22 0422 07/01/22 1619 07/02/22 0554 07/03/22 0503  NA 139  --  137 137 133* 133*  K 2.3* 2.6* 2.6* 3.1* 2.7* 3.3*  CL 98  --  103 104 102 103  CO2 26  --  26 25 25 25   GLUCOSE 103*  --  110* 117* 108* 119*  BUN 13  --  9 9 9 12   CREATININE 0.74  --  0.58* 0.60* 0.50* 0.54*  CALCIUM 9.3  --  8.2* 8.9 8.5* 8.4*  MG 1.6* 1.7  --   --  1.6* 2.1  PHOS  --   --  2.5  --  2.3* 2.9   Liver Function Tests: Recent Labs  Lab 06/30/22 1042 07/01/22 0422  AST 56* 37  ALT 23 20  ALKPHOS 81 73  BILITOT 0.7 1.5*  PROT 6.9 6.1*  ALBUMIN 3.9 3.5   No results for input(s): "LIPASE", "AMYLASE" in the last 168 hours. Recent Labs  Lab 06/30/22 2047  AMMONIA 23   Cardiac Enzymes: No results for input(s): "CKTOTAL", "CKMB", "CKMBINDEX", "TROPONINI" in the last 168 hours. BNP (last 3 results) No results for input(s): "BNP" in the last 8760 hours. CBG: Recent Labs  Lab 06/30/22 1029  GLUCAP 129*    Time spent: 35 minutes  Signed:  Val Riles  [facility] 07/03/2022 10:41 AM

## 2022-08-08 NOTE — Patient Instructions (Incomplete)
It was a pleasure meeting you today. Thank you for allowing me to take part in your health care.  Our goals for today as we discussed include:  Referral sent for colonoscopy  We will get some labs today.  If they are abnormal or we need to do something about them, I will call you.  If they are normal, I will send you a message on MyChart (if it is active) or a letter in the mail.  If you don't hear from [place] in 2 weeks, please call the office at the number below.   Recommend Shingles vaccine.  Schedule RN visit on Friday for first dose  Follow up with Neurology.  Referral sent.  Referral sent for CT lung screening   Refills sent for medications  If you have any questions or concerns, please do not hesitate to call the office at [phone].  I look forward to our next visit and until then take care and stay safe.  Regards,   Dana Allan, MD   [facility]

## 2022-08-09 ENCOUNTER — Ambulatory Visit (INDEPENDENT_AMBULATORY_CARE_PROVIDER_SITE_OTHER): Payer: BC Managed Care – PPO | Admitting: Family Medicine

## 2022-08-09 ENCOUNTER — Encounter: Payer: Self-pay | Admitting: Family Medicine

## 2022-08-09 VITALS — BP 110/60 | HR 93 | Temp 98.6°F | Resp 17 | Ht 68.75 in | Wt 172.5 lb

## 2022-08-09 DIAGNOSIS — K760 Fatty (change of) liver, not elsewhere classified: Secondary | ICD-10-CM

## 2022-08-09 DIAGNOSIS — Z720 Tobacco use: Secondary | ICD-10-CM

## 2022-08-09 DIAGNOSIS — Z8673 Personal history of transient ischemic attack (TIA), and cerebral infarction without residual deficits: Secondary | ICD-10-CM | POA: Diagnosis not present

## 2022-08-09 DIAGNOSIS — E876 Hypokalemia: Secondary | ICD-10-CM

## 2022-08-09 DIAGNOSIS — G4700 Insomnia, unspecified: Secondary | ICD-10-CM

## 2022-08-09 DIAGNOSIS — J439 Emphysema, unspecified: Secondary | ICD-10-CM

## 2022-08-09 DIAGNOSIS — F1024 Alcohol dependence with alcohol-induced mood disorder: Secondary | ICD-10-CM

## 2022-08-09 DIAGNOSIS — K7010 Alcoholic hepatitis without ascites: Secondary | ICD-10-CM

## 2022-08-09 DIAGNOSIS — J449 Chronic obstructive pulmonary disease, unspecified: Secondary | ICD-10-CM | POA: Diagnosis not present

## 2022-08-09 DIAGNOSIS — F1010 Alcohol abuse, uncomplicated: Secondary | ICD-10-CM

## 2022-08-09 DIAGNOSIS — F39 Unspecified mood [affective] disorder: Secondary | ICD-10-CM

## 2022-08-09 DIAGNOSIS — F419 Anxiety disorder, unspecified: Secondary | ICD-10-CM

## 2022-08-09 DIAGNOSIS — I10 Essential (primary) hypertension: Secondary | ICD-10-CM

## 2022-08-09 DIAGNOSIS — Z1211 Encounter for screening for malignant neoplasm of colon: Secondary | ICD-10-CM

## 2022-08-09 DIAGNOSIS — F339 Major depressive disorder, recurrent, unspecified: Secondary | ICD-10-CM | POA: Diagnosis not present

## 2022-08-09 DIAGNOSIS — E782 Mixed hyperlipidemia: Secondary | ICD-10-CM

## 2022-08-09 DIAGNOSIS — F17200 Nicotine dependence, unspecified, uncomplicated: Secondary | ICD-10-CM

## 2022-08-09 MED ORDER — MULTI-VITAMIN PO TABS: 1.0000 | ORAL_TABLET | Freq: Every day | ORAL | 3 refills | Status: DC

## 2022-08-09 MED ORDER — IPRATROPIUM-ALBUTEROL 0.5-2.5 (3) MG/3ML IN SOLN: 3.0000 mL | Freq: Four times a day (QID) | RESPIRATORY_TRACT | 11 refills | Status: AC | PRN

## 2022-08-09 MED ORDER — BUDESONIDE-FORMOTEROL FUMARATE 160-4.5 MCG/ACT IN AERO: 2.0000 | INHALATION_SPRAY | Freq: Two times a day (BID) | RESPIRATORY_TRACT | 11 refills | Status: DC

## 2022-08-09 MED ORDER — SERTRALINE HCL 50 MG PO TABS: 50.0000 mg | ORAL_TABLET | Freq: Every day | ORAL | 3 refills | Status: DC

## 2022-08-09 MED ORDER — HYDROXYZINE HCL 25 MG PO TABS: 25.0000 mg | ORAL_TABLET | Freq: Every day | ORAL | 2 refills | Status: AC | PRN

## 2022-08-09 MED ORDER — POTASSIUM CHLORIDE CRYS ER 20 MEQ PO TBCR: 20.0000 meq | EXTENDED_RELEASE_TABLET | Freq: Two times a day (BID) | ORAL | 3 refills | Status: DC

## 2022-08-09 NOTE — Progress Notes (Signed)
SUBJECTIVE:   Chief Complaint  Patient presents with   Establish Care    Previouis patient of Dr [employer]   HPI Patient presents to clinic to transfer care.   History of TIA Recently seen at [facility] on 06/30/2022 for acute onset right sided weakness and confusion.  Code stroke called.  CT head within normal limits, MRI brain within normal limits.  Neurology evaluated and recommended TIA evaluation.  EEG did not show any other form abnormalities.  Echo showed LVEF 55-60%, and mild left ventricular hypertrophy.  No PFO or other significant findings.  Continued on high-dose statin, and started ASA and Plavix and to continue monotherapy with aspirin.  Has not had follow-up appointment with neurology.  Hypertension Asymptomatic.  Previously on amlodipine 2.5 mg.  During hospitalization was increased to 5 mg and started on losartan 50 mg.  Patient reports taking Norvasc 10 mg daily and discontinuing losartan.    Mood disorder Denies SI/HI.  Takes Zoloft 50 mg daily, Atarax 12.5 mg 2-3 times weekly.  Not taking Ativan and last filled 02/2022.  EtOH use Drinks 1 pint of liquor daily.  Prescribed vitamin B 1, folic acid, magnesium 400 mg during hospitalization.  Patient reports not been taking his no refills prescribed.  Hyperlipidemia Currently on Lipitor 80 mg daily and tolerating well.  Tobacco use Reports 30-pack-year smoking history.  Emphysema Endorses some shortness of breath.  Not using Symbicort daily as prescribed.  Has DuoNeb nebulizer as needed   PERTINENT PMH / PSH: Emphysema COPD Hyperlipidemia Hypertension TIA Hypokalemia EtOH abuse EtOH hepatitis without ascites Diverticulosis Hepatic steatosis GERD   OBJECTIVE:  BP 110/60   Pulse 93   Temp 98.6 F (37 C) (Oral)   Resp 17   Ht 5' 8.75" (1.746 m)   Wt 172 lb 8 oz (78.2 kg)   SpO2 98%   BMI 25.66 kg/m    Physical Exam Vitals reviewed.  Constitutional:      General: He is not in acute distress.     Appearance: He is not ill-appearing.  HENT:     Head: Normocephalic.  Eyes:     Conjunctiva/sclera: Conjunctivae normal.  Neck:     Thyroid: No thyromegaly or thyroid tenderness.  Cardiovascular:     Rate and Rhythm: Normal rate and regular rhythm.     Pulses: Normal pulses.  Pulmonary:     Effort: Pulmonary effort is normal.     Breath sounds: Normal breath sounds.  Abdominal:     General: Bowel sounds are normal.  Neurological:     Mental Status: He is alert. Mental status is at baseline.  Psychiatric:        Mood and Affect: Mood normal.        Behavior: Behavior normal.        Thought Content: Thought content normal.        Judgment: Judgment normal.     ASSESSMENT/PLAN:  History of TIA (transient ischemic attack) -     Ambulatory referral to Neurology  Chronic obstructive pulmonary disease, unspecified COPD type (HCC) Assessment & Plan: Chronic.  Endorses shortness of breath intermittently.  Does not take Symbicort as prescribed. Recommend Symbicort 1 puff daily as prescribed Recommend smoking cessation If no improvement after initiation of Symbicort follow-up with PCP.  Orders: -     Budesonide-Formoterol Fumarate; Inhale 2 puffs into the lungs 2 (two) times daily. Rinse mouth after use  Dispense: 1 each; Refill: 11  Pulmonary emphysema, unspecified emphysema type (HCC) Assessment &  Plan: Chronic.  Endorses shortness of breath intermittently.  Does not take Symbicort as prescribed. Recommend Symbicort 1 puff daily as prescribed Recommend smoking cessation If no improvement after initiation of Symbicort follow-up with PCP.  Orders: -     Ipratropium-Albuterol; Inhale 3 mLs into the lungs every 6 (six) hours as needed.  Dispense: 360 mL; Refill: 11  Depression, recurrent (HCC) -     Sertraline HCl; Take 1 tablet (50 mg total) by mouth daily at 12 noon.  Dispense: 90 tablet; Refill: 3  Alcohol dependence with alcohol-induced mood disorder (HCC) -     Hemoglobin  A1c; Future -     CBC with Differential/Platelet; Future -     Comprehensive metabolic panel; Future -     VITAMIN D 25 Hydroxy (Vit-D Deficiency, Fractures); Future -     Vitamin B12; Future -     Folate; Future -     Magnesium; Future -     Phosphorus; Future -     Vitamin B1; Future -     hydrOXYzine HCl; Take 1 tablet (25 mg total) by mouth daily as needed.  Dispense: 90 tablet; Refill: 2 -     Multi-Vitamin; Take 1 tablet by mouth daily.  Dispense: 90 tablet; Refill: 3 -     Potassium Chloride Crys ER; Take 1 tablet (20 mEq total) by mouth 2 (two) times daily.  Dispense: 90 tablet; Refill: 3 -     Sertraline HCl; Take 1 tablet (50 mg total) by mouth daily at 12 noon.  Dispense: 90 tablet; Refill: 3  Insomnia, unspecified type -     hydrOXYzine HCl; Take 1 tablet (25 mg total) by mouth daily as needed.  Dispense: 90 tablet; Refill: 2  Hypomagnesemia Assessment & Plan: Serum magnesium low.  1.3.  Not currently taking magnesium. Restart magnesium 500 mg daily  Orders: -     Magnesium; Future  Hypokalemia Assessment & Plan: Chronic.  Takes potassium 20 mEq supplements twice daily. Check c-Met today  Orders: -     Potassium Chloride Crys ER; Take 1 tablet (20 mEq total) by mouth 2 (two) times daily.  Dispense: 90 tablet; Refill: 3  Tobacco dependence Assessment & Plan: Chronic.  34 pack year history smoking. Referral for low-dose CT chest for lung cancer screening  Encouraged smoking cessation.  Patient not interested in medications and smoking cessation.  Orders: -     Ambulatory Referral for Lung Cancer Scre  Colon cancer screening -     Ambulatory referral to Gastroenterology  Hepatic steatosis Assessment & Plan: Check LFTs  Orders: -     Ambulatory referral to Gastroenterology -     Lipid panel; Future  Alcoholic hepatitis without ascites -     Ambulatory referral to Gastroenterology  Primary hypertension Assessment & Plan: Chronic.  Stable.  Recent  increase in amlodipine from 2.5 mg to 5 mg and Cozaar 50 mg initiated during hospitalization. Continue amlodipine 5 mg daily, need to clarify dose Continue losartan 50 mg daily, need to clarify if patient taking. Monitor blood pressure at home, goal less than 140/90.   Orders: -     Hemoglobin A1c; Future -     CBC with Differential/Platelet; Future -     Comprehensive metabolic panel; Future -     TSH; Future -     Potassium Chloride Crys ER; Take 1 tablet (20 mEq total) by mouth 2 (two) times daily.  Dispense: 90 tablet; Refill: 3  Mixed hyperlipidemia Assessment & Plan:  Chronic.  On high-dose statin and tolerating well.  Denies any myalgias. Continue atorvastatin 80 mg daily    Mood disorder (HCC) -     hydrOXYzine HCl; Take 1 tablet (25 mg total) by mouth daily as needed.  Dispense: 90 tablet; Refill: 2   PDMP reviewed  Return in about 3 months (around 11/08/2022) for PCP.  Dana Allan, MD

## 2022-08-12 ENCOUNTER — Other Ambulatory Visit (INDEPENDENT_AMBULATORY_CARE_PROVIDER_SITE_OTHER): Payer: BC Managed Care – PPO

## 2022-08-12 DIAGNOSIS — I10 Essential (primary) hypertension: Secondary | ICD-10-CM | POA: Diagnosis not present

## 2022-08-12 DIAGNOSIS — F1024 Alcohol dependence with alcohol-induced mood disorder: Secondary | ICD-10-CM | POA: Diagnosis not present

## 2022-08-12 DIAGNOSIS — K760 Fatty (change of) liver, not elsewhere classified: Secondary | ICD-10-CM

## 2022-08-12 LAB — CBC WITH DIFFERENTIAL/PLATELET
Basophils Absolute: 0 10*3/uL (ref 0.0–0.1)
Basophils Relative: 0.7 % (ref 0.0–3.0)
Eosinophils Absolute: 0.1 10*3/uL (ref 0.0–0.7)
Eosinophils Relative: 2.1 % (ref 0.0–5.0)
HCT: 45.8 % (ref 39.0–52.0)
Hemoglobin: 15.5 g/dL (ref 13.0–17.0)
Lymphocytes Relative: 29.2 % (ref 12.0–46.0)
Lymphs Abs: 1.7 10*3/uL (ref 0.7–4.0)
MCHC: 33.9 g/dL (ref 30.0–36.0)
MCV: 103.7 fl — ABNORMAL HIGH (ref 78.0–100.0)
Monocytes Absolute: 0.5 10*3/uL (ref 0.1–1.0)
Monocytes Relative: 9.5 % (ref 3.0–12.0)
Neutro Abs: 3.4 10*3/uL (ref 1.4–7.7)
Neutrophils Relative %: 58.5 % (ref 43.0–77.0)
Platelets: 172 10*3/uL (ref 150.0–400.0)
RBC: 4.41 Mil/uL (ref 4.22–5.81)
RDW: 15.4 % (ref 11.5–15.5)
WBC: 5.7 10*3/uL (ref 4.0–10.5)

## 2022-08-12 LAB — COMPREHENSIVE METABOLIC PANEL
ALT: 27 U/L (ref 0–53)
AST: 51 U/L — ABNORMAL HIGH (ref 0–37)
Albumin: 4.4 g/dL (ref 3.5–5.2)
Alkaline Phosphatase: 87 U/L (ref 39–117)
BUN: 11 mg/dL (ref 6–23)
CO2: 31 mEq/L (ref 19–32)
Calcium: 9.8 mg/dL (ref 8.4–10.5)
Chloride: 101 mEq/L (ref 96–112)
Creatinine, Ser: 0.69 mg/dL (ref 0.40–1.50)
GFR: 104.59 mL/min (ref >60.00)
Glucose, Bld: 88 mg/dL (ref 70–99)
Potassium: 3.3 mEq/L — ABNORMAL LOW (ref 3.5–5.1)
Sodium: 143 mEq/L (ref 135–145)
Total Bilirubin: 0.8 mg/dL (ref 0.2–1.2)
Total Protein: 7 g/dL (ref 6.0–8.3)

## 2022-08-12 LAB — VITAMIN B12: Vitamin B-12: 582 pg/mL (ref 211–911)

## 2022-08-12 LAB — LIPID PANEL
Cholesterol: 217 mg/dL — ABNORMAL HIGH (ref 0–200)
HDL: 111.1 mg/dL (ref >39.00)
LDL Cholesterol: 94 mg/dL (ref 0–99)
NonHDL: 105.57
Total CHOL/HDL Ratio: 2
Triglycerides: 59 mg/dL (ref 0.0–149.0)
VLDL: 11.8 mg/dL (ref 0.0–40.0)

## 2022-08-12 LAB — MAGNESIUM: Magnesium: 1.4 mg/dL — ABNORMAL LOW (ref 1.5–2.5)

## 2022-08-12 LAB — TSH: TSH: 0.65 u[IU]/mL (ref 0.35–5.50)

## 2022-08-12 LAB — HEMOGLOBIN A1C: Hgb A1c MFr Bld: 5.4 % (ref 4.6–6.5)

## 2022-08-12 LAB — PHOSPHORUS: Phosphorus: 3.3 mg/dL (ref 2.3–4.6)

## 2022-08-12 LAB — VITAMIN D 25 HYDROXY (VIT D DEFICIENCY, FRACTURES): VITD: 40.32 ng/mL (ref 30.00–100.00)

## 2022-08-12 LAB — FOLATE: Folate: 10.3 ng/mL (ref >5.9)

## 2022-08-13 ENCOUNTER — Other Ambulatory Visit: Payer: Self-pay | Admitting: Family Medicine

## 2022-08-13 MED ORDER — MAGNESIUM 500 MG PO TABS: 1.0000 | ORAL_TABLET | Freq: Every day | ORAL | 3 refills | Status: DC

## 2022-08-14 ENCOUNTER — Telehealth: Payer: Self-pay | Admitting: Family Medicine

## 2022-08-14 ENCOUNTER — Encounter: Payer: Self-pay | Admitting: Family Medicine

## 2022-08-14 DIAGNOSIS — Z8673 Personal history of transient ischemic attack (TIA), and cerebral infarction without residual deficits: Secondary | ICD-10-CM | POA: Insufficient documentation

## 2022-08-14 DIAGNOSIS — Z1211 Encounter for screening for malignant neoplasm of colon: Secondary | ICD-10-CM | POA: Insufficient documentation

## 2022-08-14 DIAGNOSIS — G4700 Insomnia, unspecified: Secondary | ICD-10-CM | POA: Insufficient documentation

## 2022-08-14 DIAGNOSIS — F419 Anxiety disorder, unspecified: Secondary | ICD-10-CM | POA: Insufficient documentation

## 2022-08-14 DIAGNOSIS — F1020 Alcohol dependence, uncomplicated: Secondary | ICD-10-CM | POA: Insufficient documentation

## 2022-08-14 NOTE — Assessment & Plan Note (Addendum)
Chronic.  34 pack year history smoking. Referral for low-dose CT chest for lung cancer screening  Encouraged smoking cessation.  Patient not interested in medications and smoking cessation.

## 2022-08-14 NOTE — Assessment & Plan Note (Signed)
Chronic.  Endorses shortness of breath intermittently.  Does not take Symbicort as prescribed. Recommend Symbicort 1 puff daily as prescribed Recommend smoking cessation If no improvement after initiation of Symbicort follow-up with PCP.

## 2022-08-14 NOTE — Assessment & Plan Note (Signed)
Check LFTs 

## 2022-08-14 NOTE — Assessment & Plan Note (Signed)
Chronic.  Takes potassium 20 mEq supplements twice daily. Check c-Met today

## 2022-08-14 NOTE — Assessment & Plan Note (Addendum)
Serum magnesium low.  1.3.  Not currently taking magnesium. Restart magnesium 500 mg daily

## 2022-08-14 NOTE — Assessment & Plan Note (Signed)
Chronic.  On high-dose statin and tolerating well.  Denies any myalgias. Continue atorvastatin 80 mg daily

## 2022-08-14 NOTE — Assessment & Plan Note (Signed)
Chronic.  Stable.  Recent increase in amlodipine from 2.5 mg to 5 mg and Cozaar 50 mg initiated during hospitalization. Continue amlodipine 5 mg daily, need to clarify dose Continue losartan 50 mg daily, need to clarify if patient taking. Monitor blood pressure at home, goal less than 140/90.

## 2022-08-14 NOTE — Assessment & Plan Note (Signed)
Chronic.  Drinks 1 pint of [place] whiskey weekly.  Reports can give up alcohol if wanting to.  Longest time gone without alcoholic beverage 7 days.  Denies any delirium tremors, seizures.  Reports no hospitalizations or intubation for alcohol withdrawal. Recommend EtOH cessation, occasion to help with cessation or rehabilitation centers.

## 2022-08-14 NOTE — Assessment & Plan Note (Signed)
History of recent TIA and hospitalized for observation overnight.  CT head negative for acute infarct.  MRI brain within normal limits.  He is doing well.  Denies any headaches, visual changes, numbness/tingling, weakness of upper or lower extremities. Currently on ASA 81 mg daily Continue high intensity statin Plavix has been initiated during hospitalization.  Patient has not followed up with neurology. Will send referral for follow-up.

## 2022-08-15 NOTE — Telephone Encounter (Signed)
Called patient. Unable to LM because VM has not been set up

## 2022-08-16 LAB — VITAMIN B1: Vitamin B1 (Thiamine): 7 nmol/L — ABNORMAL LOW (ref 8–30)

## 2022-08-18 NOTE — Telephone Encounter (Signed)
Called pt was unable to LVM vm was not set up

## 2022-08-19 ENCOUNTER — Encounter: Payer: Self-pay | Admitting: *Deleted

## 2022-08-19 NOTE — Telephone Encounter (Signed)
Voicemail not setup-mailed letter asking pt to return our call.

## 2022-10-05 ENCOUNTER — Other Ambulatory Visit: Payer: Self-pay | Admitting: Family Medicine

## 2022-10-05 DIAGNOSIS — F1024 Alcohol dependence with alcohol-induced mood disorder: Secondary | ICD-10-CM

## 2022-10-05 DIAGNOSIS — I10 Essential (primary) hypertension: Secondary | ICD-10-CM

## 2022-10-05 DIAGNOSIS — E876 Hypokalemia: Secondary | ICD-10-CM

## 2022-10-05 NOTE — Telephone Encounter (Signed)
Prescription Request  10/05/2022  LOV: 08/09/2022  What is the name of the medication or equipment? potassium chloride SA (KLOR-CON M) 20 MEQ tablet  Have you contacted your pharmacy to request a refill? Yes   Which pharmacy would you like this sent to?   [id number] [place], [place] - [address] AT [facility] [address] [place] [address] Phone: [phone] Fax: [phone]   Patient notified that their request is being sent to the clinical staff for review and that they should receive a response within 2 business days.   Please advise at Mobile [phone] (mobile)

## 2022-10-06 ENCOUNTER — Other Ambulatory Visit: Payer: Self-pay | Admitting: Family Medicine

## 2022-10-06 DIAGNOSIS — I10 Essential (primary) hypertension: Secondary | ICD-10-CM

## 2022-10-06 DIAGNOSIS — F1024 Alcohol dependence with alcohol-induced mood disorder: Secondary | ICD-10-CM

## 2022-10-06 DIAGNOSIS — E876 Hypokalemia: Secondary | ICD-10-CM

## 2022-10-06 MED ORDER — POTASSIUM CHLORIDE CRYS ER 20 MEQ PO TBCR: 20.0000 meq | EXTENDED_RELEASE_TABLET | Freq: Two times a day (BID) | ORAL | 3 refills | Status: DC

## 2022-10-06 NOTE — Telephone Encounter (Signed)
Is pt supposed to be taking potassium twice daily or once daily? Rx sig states twice daily but quantity is only for one tablet daily.

## 2022-10-26 ENCOUNTER — Other Ambulatory Visit: Payer: Self-pay

## 2022-10-26 ENCOUNTER — Emergency Department: Payer: BC Managed Care – PPO

## 2022-10-26 ENCOUNTER — Observation Stay (HOSPITAL_BASED_OUTPATIENT_CLINIC_OR_DEPARTMENT_OTHER)
Admit: 2022-10-26 | Discharge: 2022-10-26 | Disposition: A | Discharge status: Home / Self Care | Payer: BC Managed Care – PPO | Attending: Family Medicine | Admitting: Family Medicine

## 2022-10-26 ENCOUNTER — Encounter: Payer: Self-pay | Admitting: Family Medicine

## 2022-10-26 ENCOUNTER — Observation Stay
Admission: EM | Admit: 2022-10-26 | Discharge: 2022-10-27 | Disposition: A | Discharge status: Home / Self Care | Payer: BC Managed Care – PPO | Attending: Internal Medicine | Admitting: Internal Medicine

## 2022-10-26 DIAGNOSIS — F1010 Alcohol abuse, uncomplicated: Secondary | ICD-10-CM

## 2022-10-26 DIAGNOSIS — Z7902 Long term (current) use of antithrombotics/antiplatelets: Secondary | ICD-10-CM | POA: Insufficient documentation

## 2022-10-26 DIAGNOSIS — I10 Essential (primary) hypertension: Secondary | ICD-10-CM | POA: Diagnosis present

## 2022-10-26 DIAGNOSIS — R531 Weakness: Secondary | ICD-10-CM | POA: Diagnosis present

## 2022-10-26 DIAGNOSIS — R55 Syncope and collapse: Secondary | ICD-10-CM

## 2022-10-26 DIAGNOSIS — F39 Unspecified mood [affective] disorder: Secondary | ICD-10-CM | POA: Diagnosis present

## 2022-10-26 DIAGNOSIS — E876 Hypokalemia: Secondary | ICD-10-CM | POA: Diagnosis not present

## 2022-10-26 DIAGNOSIS — J449 Chronic obstructive pulmonary disease, unspecified: Secondary | ICD-10-CM | POA: Diagnosis not present

## 2022-10-26 DIAGNOSIS — D696 Thrombocytopenia, unspecified: Secondary | ICD-10-CM | POA: Diagnosis present

## 2022-10-26 DIAGNOSIS — Z8673 Personal history of transient ischemic attack (TIA), and cerebral infarction without residual deficits: Secondary | ICD-10-CM | POA: Diagnosis not present

## 2022-10-26 DIAGNOSIS — R079 Chest pain, unspecified: Secondary | ICD-10-CM

## 2022-10-26 DIAGNOSIS — J441 Chronic obstructive pulmonary disease with (acute) exacerbation: Secondary | ICD-10-CM | POA: Diagnosis present

## 2022-10-26 DIAGNOSIS — Z8616 Personal history of COVID-19: Secondary | ICD-10-CM | POA: Insufficient documentation

## 2022-10-26 DIAGNOSIS — F17210 Nicotine dependence, cigarettes, uncomplicated: Secondary | ICD-10-CM | POA: Diagnosis not present

## 2022-10-26 DIAGNOSIS — R0789 Other chest pain: Secondary | ICD-10-CM | POA: Diagnosis present

## 2022-10-26 DIAGNOSIS — Z716 Tobacco abuse counseling: Secondary | ICD-10-CM

## 2022-10-26 DIAGNOSIS — E871 Hypo-osmolality and hyponatremia: Secondary | ICD-10-CM | POA: Insufficient documentation

## 2022-10-26 DIAGNOSIS — F1024 Alcohol dependence with alcohol-induced mood disorder: Secondary | ICD-10-CM | POA: Diagnosis present

## 2022-10-26 DIAGNOSIS — Z79899 Other long term (current) drug therapy: Secondary | ICD-10-CM | POA: Insufficient documentation

## 2022-10-26 DIAGNOSIS — N179 Acute kidney failure, unspecified: Secondary | ICD-10-CM | POA: Diagnosis not present

## 2022-10-26 DIAGNOSIS — K7010 Alcoholic hepatitis without ascites: Secondary | ICD-10-CM | POA: Diagnosis present

## 2022-10-26 LAB — BASIC METABOLIC PANEL
Anion gap: 17 — ABNORMAL HIGH (ref 5–15)
BUN: 29 mg/dL — ABNORMAL HIGH (ref 6–20)
CO2: 26 mmol/L (ref 22–32)
Calcium: 9.1 mg/dL (ref 8.9–10.3)
Chloride: 91 mmol/L — ABNORMAL LOW (ref 98–111)
Creatinine, Ser: 2.09 mg/dL — ABNORMAL HIGH (ref 0.61–1.24)
GFR, Estimated: 37 mL/min — ABNORMAL LOW (ref >60)
Glucose, Bld: 121 mg/dL — ABNORMAL HIGH (ref 70–99)
Potassium: 2.2 mmol/L — CL (ref 3.5–5.1)
Sodium: 134 mmol/L — ABNORMAL LOW (ref 135–145)

## 2022-10-26 LAB — TROPONIN I (HIGH SENSITIVITY)
Troponin I (High Sensitivity): 8 ng/L (ref <18)
Troponin I (High Sensitivity): 7 ng/L (ref <18)

## 2022-10-26 LAB — MAGNESIUM: Magnesium: 1.4 mg/dL — ABNORMAL LOW (ref 1.7–2.4)

## 2022-10-26 LAB — CBC
HCT: 46.6 % (ref 39.0–52.0)
Hemoglobin: 16.3 g/dL (ref 13.0–17.0)
MCH: 34.6 pg — ABNORMAL HIGH (ref 26.0–34.0)
MCHC: 35 g/dL (ref 30.0–36.0)
MCV: 98.9 fL (ref 80.0–100.0)
Platelets: 108 10*3/uL — ABNORMAL LOW (ref 150–400)
RBC: 4.71 MIL/uL (ref 4.22–5.81)
RDW: 13 % (ref 11.5–15.5)
WBC: 8.7 10*3/uL (ref 4.0–10.5)
nRBC: 0 % (ref 0.0–0.2)

## 2022-10-26 LAB — ETHANOL: Alcohol, Ethyl (B): <10 mg/dL (ref <10)

## 2022-10-26 LAB — HEPATIC FUNCTION PANEL
ALT: 45 U/L — ABNORMAL HIGH (ref 0–44)
AST: 114 U/L — ABNORMAL HIGH (ref 15–41)
Albumin: 4.5 g/dL (ref 3.5–5.0)
Alkaline Phosphatase: 100 U/L (ref 38–126)
Bilirubin, Direct: 0.5 mg/dL — ABNORMAL HIGH (ref 0.0–0.2)
Indirect Bilirubin: 1.4 mg/dL — ABNORMAL HIGH (ref 0.3–0.9)
Total Bilirubin: 1.9 mg/dL — ABNORMAL HIGH (ref 0.3–1.2)
Total Protein: 7.3 g/dL (ref 6.5–8.1)

## 2022-10-26 LAB — ECHOCARDIOGRAM COMPLETE: Weight: 2704 oz

## 2022-10-26 LAB — HIV ANTIBODY (ROUTINE TESTING W REFLEX): HIV Screen 4th Generation wRfx: NONREACTIVE

## 2022-10-26 LAB — LACTIC ACID, PLASMA
Lactic Acid, Venous: 1.3 mmol/L (ref 0.5–1.9)
Lactic Acid, Venous: 0.9 mmol/L (ref 0.5–1.9)

## 2022-10-26 LAB — LIPASE, BLOOD: Lipase: 49 U/L (ref 11–51)

## 2022-10-26 LAB — BRAIN NATRIURETIC PEPTIDE: B Natriuretic Peptide: 26.2 pg/mL (ref 0.0–100.0)

## 2022-10-26 MED ORDER — ENOXAPARIN SODIUM 40 MG/0.4ML IJ SOSY
40.0000 mg | PREFILLED_SYRINGE | INTRAMUSCULAR | Status: DC
  Administered 2022-10-26: 40 mg via SUBCUTANEOUS
  Filled 2022-10-26: qty 0.4

## 2022-10-26 MED ORDER — ASPIRIN 81 MG PO TBEC
81.0000 mg | DELAYED_RELEASE_TABLET | Freq: Every day | ORAL | Status: DC
  Administered 2022-10-27: 81 mg via ORAL
  Filled 2022-10-26: qty 1

## 2022-10-26 MED ORDER — LORAZEPAM 1 MG PO TABS
1.0000 mg | ORAL_TABLET | ORAL | Status: DC | PRN
  Administered 2022-10-26 (×2): 1 mg via ORAL
  Filled 2022-10-26 (×2): qty 1

## 2022-10-26 MED ORDER — SERTRALINE HCL 50 MG PO TABS
50.0000 mg | ORAL_TABLET | Freq: Every day | ORAL | Status: DC
  Administered 2022-10-27: 50 mg via ORAL
  Filled 2022-10-26: qty 1

## 2022-10-26 MED ORDER — ONDANSETRON HCL 4 MG/2ML IJ SOLN: 4.0000 mg | Freq: Four times a day (QID) | INTRAMUSCULAR | Status: DC | PRN

## 2022-10-26 MED ORDER — MOMETASONE FURO-FORMOTEROL FUM 200-5 MCG/ACT IN AERO
2.0000 | INHALATION_SPRAY | Freq: Two times a day (BID) | RESPIRATORY_TRACT | Status: DC
  Filled 2022-10-26: qty 8.8

## 2022-10-26 MED ORDER — AMLODIPINE BESYLATE 5 MG PO TABS
2.5000 mg | ORAL_TABLET | Freq: Every day | ORAL | Status: DC
  Administered 2022-10-26 – 2022-10-27 (×2): 2.5 mg via ORAL
  Filled 2022-10-26 (×2): qty 1

## 2022-10-26 MED ORDER — LORAZEPAM 2 MG/ML IJ SOLN: 1.0000 mg | INTRAMUSCULAR | Status: DC | PRN

## 2022-10-26 MED ORDER — ONDANSETRON HCL 4 MG PO TABS: 4.0000 mg | ORAL_TABLET | Freq: Four times a day (QID) | ORAL | Status: DC | PRN

## 2022-10-26 MED ORDER — NICOTINE 21 MG/24HR TD PT24
21.0000 mg | MEDICATED_PATCH | TRANSDERMAL | Status: DC
  Administered 2022-10-26: 21 mg via TRANSDERMAL
  Filled 2022-10-26: qty 1

## 2022-10-26 MED ORDER — ASPIRIN 81 MG PO CHEW
324.0000 mg | CHEWABLE_TABLET | Freq: Once | ORAL | Status: AC
  Administered 2022-10-26: 324 mg via ORAL
  Filled 2022-10-26: qty 4

## 2022-10-26 MED ORDER — VITAMIN B-12 1000 MCG PO TABS
1000.0000 ug | ORAL_TABLET | Freq: Every day | ORAL | Status: DC
  Administered 2022-10-26 – 2022-10-27 (×2): 1000 ug via ORAL
  Filled 2022-10-26: qty 1
  Filled 2022-10-26: qty 2

## 2022-10-26 MED ORDER — POTASSIUM CHLORIDE 2 MEQ/ML IV SOLN: INTRAVENOUS | Status: DC

## 2022-10-26 MED ORDER — POTASSIUM CHLORIDE CRYS ER 20 MEQ PO TBCR
20.0000 meq | EXTENDED_RELEASE_TABLET | Freq: Two times a day (BID) | ORAL | Status: DC
  Administered 2022-10-26: 20 meq via ORAL
  Filled 2022-10-26: qty 1

## 2022-10-26 MED ORDER — LOSARTAN POTASSIUM 50 MG PO TABS: 50.0000 mg | ORAL_TABLET | Freq: Every evening | ORAL | Status: DC

## 2022-10-26 MED ORDER — MAGNESIUM SULFATE 2 GM/50ML IV SOLN
2.0000 g | Freq: Once | INTRAVENOUS | Status: AC
  Administered 2022-10-26: 2 g via INTRAVENOUS
  Filled 2022-10-26: qty 50

## 2022-10-26 MED ORDER — THIAMINE MONONITRATE 100 MG PO TABS
100.0000 mg | ORAL_TABLET | Freq: Every day | ORAL | Status: DC
  Administered 2022-10-26 – 2022-10-27 (×2): 100 mg via ORAL
  Filled 2022-10-26 (×2): qty 1

## 2022-10-26 MED ORDER — HYDROXYZINE HCL 25 MG PO TABS: 25.0000 mg | ORAL_TABLET | Freq: Every day | ORAL | Status: DC | PRN

## 2022-10-26 MED ORDER — POTASSIUM CHLORIDE CRYS ER 20 MEQ PO TBCR
40.0000 meq | EXTENDED_RELEASE_TABLET | Freq: Once | ORAL | Status: AC
  Administered 2022-10-26: 40 meq via ORAL
  Filled 2022-10-26: qty 2

## 2022-10-26 MED ORDER — ATORVASTATIN CALCIUM 20 MG PO TABS
80.0000 mg | ORAL_TABLET | Freq: Every day | ORAL | Status: DC
  Administered 2022-10-26 – 2022-10-27 (×2): 80 mg via ORAL
  Filled 2022-10-26 (×2): qty 4

## 2022-10-26 MED ORDER — SODIUM CHLORIDE 0.9 % IV BOLUS
1000.0000 mL | Freq: Once | INTRAVENOUS | Status: AC
  Administered 2022-10-26: 1000 mL via INTRAVENOUS

## 2022-10-26 MED ORDER — KCL IN DEXTROSE-NACL 20-5-0.45 MEQ/L-%-% IV SOLN
INTRAVENOUS | Status: DC
  Filled 2022-10-26 (×3): qty 1000

## 2022-10-26 MED ORDER — ADULT MULTIVITAMIN W/MINERALS CH
1.0000 | ORAL_TABLET | Freq: Every day | ORAL | Status: DC
  Administered 2022-10-26 – 2022-10-27 (×2): 1 via ORAL
  Filled 2022-10-26 (×2): qty 1

## 2022-10-26 MED ORDER — FENTANYL CITRATE PF 50 MCG/ML IJ SOSY
50.0000 ug | PREFILLED_SYRINGE | Freq: Once | INTRAMUSCULAR | Status: AC
  Administered 2022-10-26: 50 ug via INTRAVENOUS
  Filled 2022-10-26: qty 1

## 2022-10-26 MED ORDER — FOLIC ACID 1 MG PO TABS
1.0000 mg | ORAL_TABLET | Freq: Every day | ORAL | Status: DC
  Administered 2022-10-26 – 2022-10-27 (×2): 1 mg via ORAL
  Filled 2022-10-26 (×2): qty 1

## 2022-10-26 MED ORDER — POTASSIUM CHLORIDE 10 MEQ/100ML IV SOLN
10.0000 meq | Freq: Once | INTRAVENOUS | Status: AC
  Administered 2022-10-26: 10 meq via INTRAVENOUS
  Filled 2022-10-26: qty 100

## 2022-10-26 MED ORDER — ASPIRIN 81 MG PO TBEC: 81.0000 mg | DELAYED_RELEASE_TABLET | Freq: Every day | ORAL | Status: DC

## 2022-10-26 MED ORDER — SODIUM CHLORIDE 0.9 % IV BOLUS
500.0000 mL | Freq: Once | INTRAVENOUS | Status: AC
  Administered 2022-10-26: 500 mL via INTRAVENOUS

## 2022-10-26 NOTE — H&P (Addendum)
History and Physical    Patient: Darrell Santiago WUJ:[id number] DOB: Mar 09, 1966 DOA: 10/26/2022 DOS: the patient was seen and examined on 10/26/2022 PCP: Dana Allan, MD  Patient coming from: Home  Chief Complaint:  Chief Complaint  Patient presents with   Weakness   HPI: Darrell Santiago is a 57 y.o. male with medical history significant of multiple medical issues including COPD, alcohol abuse, hyperlipidemia, hypertension, depression, TIA presenting with hypokalemia, chest pain, AKI.  Patient reports generalized malaise over the past 1 to 2 days.  Has had weakness, fatigue, intermittent chest pain.  Patient notes chronic hypokalemia.  States the symptoms occur when he misses his doses of oral potassium.  Patient states he missed doses from last week because of the holiday.  Positive nausea and decreased p.o. intake.  No shortness of breath.  Has been otherwise compliant with home regimen per patient.  He states that his wife coordinates all of his medications.  Had 1 episode of chest pain earlier today.  Patient states is similar to prior episodes of hypokalemia.  No reported diaphoresis.  Baseline 1 pint of liquor daily.  Also smokes 1 pack/day.  No abdominal pain or diarrhea. Presented to the ER afebrile, hemodynamically stable.  White count 8.7, hemoglobin 16.3, platelets 108.  Troponin negative x 2.  Potassium 2.2, creatinine 2.1.  EKG sinus tach with right bundle branch block.  QTc 508.  Chest x-ray stable. Review of Systems: As mentioned in the history of present illness. All other systems reviewed and are negative. Past Medical History:  Diagnosis Date   Abnormal EKG    Allergy    Seasonal   Anxiety    Chicken pox    COPD (chronic obstructive pulmonary disease) (HCC)    COVID-19    04/20/21   Depression    GERD (gastroesophageal reflux disease)    Hyperlipidemia    Hypertension    Urine incontinence    Past Surgical History:  Procedure Laterality Date   CHOLECYSTECTOMY  2000    LEFT HEART CATH AND CORONARY ANGIOGRAPHY N/A 01/31/2017   Procedure: LEFT HEART CATH AND CORONARY ANGIOGRAPHY;  Surgeon: Laurier Nancy, MD;  Location: [facility] INVASIVE CV LAB;  Service: Cardiovascular;  Laterality: N/A;   Social History:  reports that he has been smoking cigarettes. He has a 26.00 pack-year smoking history. He has never used smokeless tobacco. He reports current alcohol use of about 21.0 standard drinks of alcohol per week. He reports that he does not use drugs.  No Known Allergies  Family History  Problem Relation Age of Onset   Alcohol abuse Father    Diabetes Father    Cancer Father        ?stage IV thyroid with met   Cancer Maternal Grandfather        Colon Cancer    Prior to Admission medications   Medication Sig Start Date End Date Taking? Authorizing Provider  acetaminophen (TYLENOL) 325 MG tablet Take 2 tablets (650 mg total) by mouth every 6 (six) hours as needed for mild pain (or Fever >/= 101). 07/26/18  Yes Enid Baas, MD  amLODipine (NORVASC) 2.5 MG tablet Take 2.5 mg by mouth daily. 07/29/22  Yes [provider]  aspirin EC 81 MG tablet Take 1 tablet (81 mg total) by mouth daily. Swallow whole. 07/04/22 07/04/23 Yes Gillis Santa, MD  atorvastatin (LIPITOR) 80 MG tablet Take 1 tablet (80 mg total) by mouth daily. 07/04/22 12/31/22 Yes Gillis Santa, MD  hydrOXYzine (ATARAX) 25  MG tablet Take 1 tablet (25 mg total) by mouth daily as needed. 08/09/22  Yes Dana Allan, MD  ipratropium-albuterol (DUONEB) 0.5-2.5 (3) MG/3ML SOLN Inhale 3 mLs into the lungs every 6 (six) hours as needed. 08/09/22  Yes Dana Allan, MD  losartan (COZAAR) 50 MG tablet Take 1 tablet (50 mg total) by mouth every evening. 07/03/22 12/30/22 Yes Gillis Santa, MD  Magnesium 500 MG TABS Take 1 tablet (500 mg total) by mouth daily. Patient not taking: Reported on 10/26/2022 08/13/22   Dana Allan, MD  Multiple Vitamin (MULTI-VITAMIN) tablet Take 1 tablet by mouth daily. 08/09/22  Yes  Dana Allan, MD  potassium chloride SA (KLOR-CON M) 20 MEQ tablet Take 1 tablet (20 mEq total) by mouth 2 (two) times daily. 10/06/22  Yes Dana Allan, MD  sertraline (ZOLOFT) 50 MG tablet Take 1 tablet (50 mg total) by mouth daily at 12 noon. 08/09/22  Yes Dana Allan, MD  thiamine (VITAMIN B-1) 100 MG tablet Take 1 tablet (100 mg total) by mouth daily. 01/01/22  Yes Darlin Priestly, MD  vitamin B-12 (CYANOCOBALAMIN) 1000 MCG tablet Take 1,000 mcg by mouth daily.    Yes [provider]  amLODipine (NORVASC) 5 MG tablet Take 1 tablet (5 mg total) by mouth daily. Patient not taking: Reported on 10/26/2022 07/03/22 07/03/23  Gillis Santa, MD  budesonide-formoterol [facility]) 160-4.5 MCG/ACT inhaler Inhale 2 puffs into the lungs 2 (two) times daily. Rinse mouth after use 08/09/22   Dana Allan, MD  clopidogrel (PLAVIX) 75 MG tablet Take 75 mg by mouth daily. Patient not taking: Reported on 10/26/2022 07/29/22   [provider]  pravastatin (PRAVACHOL) 40 MG tablet Take 40 mg by mouth at bedtime. Patient not taking: Reported on 10/26/2022 07/29/22   [provider]    Physical Exam: Vitals:   10/26/22 1330 10/26/22 1400 10/26/22 1430 10/26/22 1500  BP: 119/77 121/76 122/76 117/79  Pulse: 76 76 79 79  Resp: 18 15 18 18   Temp:      TempSrc:      SpO2: 99% 97% 91% 90%  Weight:      Height:       Physical Exam Constitutional:      Appearance: He is normal weight.  HENT:     Head: Normocephalic and atraumatic.     Nose: Nose normal.     Mouth/Throat:     Mouth: Mucous membranes are moist.  Eyes:     Pupils: Pupils are equal, round, and reactive to light.  Cardiovascular:     Rate and Rhythm: Normal rate and regular rhythm.  Pulmonary:     Effort: Pulmonary effort is normal.  Abdominal:     General: Bowel sounds are normal.  Musculoskeletal:        General: Normal range of motion.  Skin:    General: Skin is warm.  Neurological:     General: No focal deficit  present.  Psychiatric:        Mood and Affect: Mood normal.     Data Reviewed:  There are no new results to review at this time. DG Chest Portable 1 View CLINICAL DATA:  Chest pain  EXAM: PORTABLE CHEST 1 VIEW  COMPARISON:  12/30/2021  FINDINGS: Lungs are clear. No pneumothorax or pleural effusion. Cardiac size within normal limits. Pulmonary vascularity is normal. No acute bone abnormality.  IMPRESSION: No active disease.  Electronically Signed   By: Helyn Numbers M.D.   On: 10/26/2022 11:50  Lab Results  Component  Value Date   WBC 8.7 10/26/2022   HGB 16.3 10/26/2022   HCT 46.6 10/26/2022   MCV 98.9 10/26/2022   PLT 108 (L) 10/26/2022   Last metabolic panel Lab Results  Component Value Date   GLUCOSE 121 (H) 10/26/2022   NA 134 (L) 10/26/2022   K 2.2 (LL) 10/26/2022   CL 91 (L) 10/26/2022   CO2 26 10/26/2022   BUN 29 (H) 10/26/2022   CREATININE 2.09 (H) 10/26/2022   GFRNONAA 37 (L) 10/26/2022   CALCIUM 9.1 10/26/2022   PHOS 3.3 08/12/2022   PROT 7.3 10/26/2022   ALBUMIN 4.5 10/26/2022   BILITOT 1.9 (H) 10/26/2022   ALKPHOS 100 10/26/2022   AST 114 (H) 10/26/2022   ALT 45 (H) 10/26/2022   ANIONGAP 17 (H) 10/26/2022    Assessment and Plan: 1- Hypokalemia - K2.2 on presentation in setting of alcohol abuse and decreased p.o. intake - Recurrent issue on supplementation with missed dosing - Replete potassium - Check mag level - Replete as appropriate - Monitor  2- Chest pain - Intermittent nonspecific chest pain in the setting of hypokalemia - Patient reports similar symptom profile associated with episodes of hypokalemia - Troponin negative x 2 - Chest x-ray within normal limits - EKG fairly stable apart from right bundle branch block which is noted on prior EKGs -Heart score around 4 - Will check 2D echo x 1 - Continue home aspirin - Consult cardiology for likely outpatient stress test  3- AKI - 2.1 on presentation -Suspect prerenal  etiology in the setting of decreased p.o. intake - Check FENa and renal ultrasound - Gently hydrate the patient - Renal ultrasound - Follow  4-history of TIA - Noted admission March 2024 for TIA evaluation - Completed course of DAPT x 21 days - On monotherapy with baby aspirin and high-dose statin - No residual deficits are present - Continue home regimen  5-alcohol abuse - Baseline 1 point of liquor daily drinker - Alcohol level pending - Start CIWA protocol - Thiamine, folate, multivitamin - Follow  6-tobacco abuse/COPD - 1 pack/day smoker - Discussed cessation - Nicotine patch - Fairly stable from respiratory standpoint - Continue home inhaler regimen - Follow  7-hypertension - BP stable - Titrate home regimen  8-GERD - PPI   Greater than 50% was spent in counseling and coordination of care with patient Total encounter time 80 minutes or more   Advance Care Planning:   Code Status: Full Code   Consults: Cardiology   Family Communication: No family at the bedside   Severity of Illness: The appropriate patient status for this patient is OBSERVATION. Observation status is judged to be reasonable and necessary in order to provide the required intensity of service to ensure the patient's safety. The patient's presenting symptoms, physical exam findings, and initial radiographic and laboratory data in the context of their medical condition is felt to place them at decreased risk for further clinical deterioration. Furthermore, it is anticipated that the patient will be medically stable for discharge from the hospital within 2 midnights of admission.   Author: Floydene Flock, MD 10/26/2022 3:32 PM  For on call review [email or web address].

## 2022-10-26 NOTE — ED Provider Notes (Addendum)
[facility] Provider Note    Event Date/Time   First MD Initiated Contact with Patient 10/26/22 1055     (approximate)   History   Weakness   HPI  Darrell Santiago is a 57 y.o. male past medical history significant for hypertension, tobacco use, hyperlipidemia, alcohol use, questionable history of CAD, who presents to the emergency department with not feeling well.  States that he was at work today when he had a sudden onset of generalized weakness, chest pain and was not feeling well.  States that he is having chest pressure that he currently rates as a 9/10.  Denies shortness of breath,.  States that it is a pressure sensation, no sharp stabbing pain or tearing sensation.  Denies any episodes of nausea, vomiting, diaphoresis or diarrhea.  No blood in his stool.  Daily alcohol use and drinks a pint of alcohol on a daily basis.  No change in vision or altered mental status.     Physical Exam   Triage Vital Signs: ED Triage Vitals  Enc Vitals Group     BP 10/26/22 1049 (!) 139/98     Pulse Rate 10/26/22 1049 (!) 118     Resp 10/26/22 1049 18     Temp 10/26/22 1049 98.2 F (36.8 C)     Temp Source 10/26/22 1049 Oral     SpO2 10/26/22 1049 92 %     Weight 10/26/22 1045 169 lb (76.7 kg)     Height 10/26/22 1045 5\' 9"  (1.753 m)     Head Circumference --      Peak Flow --      Pain Score 10/26/22 1044 8     Pain Loc --      Pain Edu? --      Excl. in GC? --     Most recent vital signs: Vitals:   10/26/22 1230 10/26/22 1330  BP: 107/72 119/77  Pulse: 84 76  Resp: 18 18  Temp:    SpO2: 98% 99%    Physical Exam Constitutional:      Appearance: He is well-developed. He is ill-appearing.  HENT:     Head: Atraumatic.  Eyes:     Conjunctiva/sclera: Conjunctivae normal.  Cardiovascular:     Rate and Rhythm: Regular rhythm.     Heart sounds: No murmur heard. Pulmonary:     Effort: No respiratory distress.  Abdominal:     Tenderness: There is no  abdominal tenderness.     Comments: Negative Murphy sign.  No rebound or guarding.  Musculoskeletal:     Cervical back: Normal range of motion.     Right lower leg: No edema.     Left lower leg: No edema.     Comments: +2 radial and DP pulses that are equal bilaterally  Skin:    General: Skin is warm.  Neurological:     Mental Status: He is alert. Mental status is at baseline.     Comments: Oriented.  5/5 strength bilateral upper and lower extremities.  Cranial nerves intact.     IMPRESSION / MDM / ASSESSMENT AND PLAN / ED COURSE  I reviewed the triage vital signs and the nursing notes.  Differential diagnosis including ACS, pneumonia, dehydration, intra-abdominal pathology, pulmonary embolism, dissection, cirrhosis, alcoholic intoxication, electrolyte abnormality, dehydration  EKG  I, Corena Herter, the attending physician, personally viewed and interpreted this ECG.  Right bundle branch block.  QTc 526.  No significant ST elevation or depression.  No  significant change when compared to prior EKG.  No signs of acute ischemia or dysrhythmia.  Repeat EKG was obtained given ongoing pain with no change from initial EKG.  No tachycardic or bradycardic dysrhythmias while on cardiac telemetry.  RADIOLOGY I independently reviewed imaging, my interpretation of imaging: Chest x-ray with no signs of pneumonia  LABS (all labs ordered are listed, but only abnormal results are displayed) Labs interpreted as -    Labs Reviewed  CBC - Abnormal; Notable for the following components:      Result Value   MCH 34.6 (*)    Platelets 108 (*)    All other components within normal limits  BASIC METABOLIC PANEL - Abnormal; Notable for the following components:   Sodium 134 (*)    Potassium 2.2 (*)    Chloride 91 (*)    Glucose, Bld 121 (*)    BUN 29 (*)    Creatinine, Ser 2.09 (*)    GFR, Estimated 37 (*)    Anion gap 17 (*)    All other components within normal limits  HEPATIC FUNCTION  PANEL - Abnormal; Notable for the following components:   AST 114 (*)    ALT 45 (*)    Total Bilirubin 1.9 (*)    Bilirubin, Direct 0.5 (*)    Indirect Bilirubin 1.4 (*)    All other components within normal limits  MAGNESIUM - Abnormal; Notable for the following components:   Magnesium 1.4 (*)    All other components within normal limits  CULTURE, BLOOD (SINGLE)  BRAIN NATRIURETIC PEPTIDE  LIPASE, BLOOD  LACTIC ACID, PLASMA  LACTIC ACID, PLASMA  TROPONIN I (HIGH SENSITIVITY)  TROPONIN I (HIGH SENSITIVITY)     MDM  Troponin negative, have a low suspicion for ACS  Creatinine elevated with acute kidney injury creatinine 2.0 with a baseline of 0.9.  A significant hypokalemia of 2.2.  BUN is elevated, concern for prerenal.  Does have an elevated anion gap.  Do not see any diabetic medications.  Possibly given elevated BUN.  Will add on a lactic acid.  Magnesium level is low at 1.4.  Does have EKG changes with hypomanic and hypokalemia  Given multiple boluses of IV fluids, given IV and oral potassium replacement, given IV magnesium replacement  On reevaluation patient's symptoms have resolved.  No longer with ongoing chest pain.  Have a low suspicion for dissection given his normal blood pressures, equal pulses and no tearing chest pain.  No signs of Warnicke's encephalopathy.  Chest x-ray with no signs of pneumonia and no signs of widened mediastinum.  Repeat abdominal exam is nontender no right upper quadrant abdominal tenderness have a low suspicion for acute cholecystitis.  Given acute kidney injury and multiple electrolyte abnormalities consulted hospitalist for admission.     PROCEDURES:  Critical Care performed: No  Procedures  Patient's presentation is most consistent with acute presentation with potential threat to life or bodily function.   MEDICATIONS ORDERED IN ED: Medications  magnesium sulfate IVPB 2 g 50 mL (2 g Intravenous New Bag/Given 10/26/22 1405)  sodium  chloride 0.9 % bolus 500 mL (0 mLs Intravenous Stopped 10/26/22 1149)  aspirin chewable tablet 324 mg (324 mg Oral Given 10/26/22 1115)  fentaNYL (SUBLIMAZE) injection 50 mcg (50 mcg Intravenous Given 10/26/22 1116)  potassium chloride 10 mEq in 100 mL IVPB (0 mEq Intravenous Stopped 10/26/22 1354)  sodium chloride 0.9 % bolus 1,000 mL (0 mLs Intravenous Stopped 10/26/22 1354)  potassium chloride SA (KLOR-CON M)  CR tablet 40 mEq (40 mEq Oral Given 10/26/22 1248)    FINAL CLINICAL IMPRESSION(S) / ED DIAGNOSES   Final diagnoses:  Weakness  Syncope, unspecified syncope type  Hypokalemia  Hypomagnesemia     Rx / DC Orders   ED Discharge Orders     None        Note:  This document was prepared using Dragon voice recognition software and may include unintentional dictation errors.   Corena Herter, MD 10/26/22 1357    Corena Herter, MD 10/26/22 1357    Corena Herter, MD 10/26/22 1449

## 2022-10-26 NOTE — ED Notes (Signed)
Transport notified to take the patient to the floor.

## 2022-10-26 NOTE — ED Triage Notes (Signed)
Pt via POV from home. Pt c/o L sided chest pain, blurry vision, SOB, and generalized weakness when he was at work this morning. States that he felt ok when he woke up this AM. Pt has a hx of NSTEMI 10 years ago, HTN. Pt does take baby ASA daily. Pt is A&Ox4 and NAD but seems uncomfortable.

## 2022-10-26 NOTE — Plan of Care (Signed)
°  Problem: Coping: °Goal: Level of anxiety will decrease °Outcome: Progressing °  °

## 2022-10-27 ENCOUNTER — Observation Stay: Payer: BC Managed Care – PPO

## 2022-10-27 DIAGNOSIS — N179 Acute kidney failure, unspecified: Secondary | ICD-10-CM | POA: Insufficient documentation

## 2022-10-27 DIAGNOSIS — F1024 Alcohol dependence with alcohol-induced mood disorder: Secondary | ICD-10-CM | POA: Diagnosis not present

## 2022-10-27 DIAGNOSIS — E876 Hypokalemia: Secondary | ICD-10-CM | POA: Diagnosis not present

## 2022-10-27 DIAGNOSIS — E871 Hypo-osmolality and hyponatremia: Secondary | ICD-10-CM | POA: Insufficient documentation

## 2022-10-27 LAB — CBC
HCT: 39 % (ref 39.0–52.0)
Hemoglobin: 13.4 g/dL (ref 13.0–17.0)
MCH: 34.9 pg — ABNORMAL HIGH (ref 26.0–34.0)
MCHC: 34.4 g/dL (ref 30.0–36.0)
MCV: 101.6 fL — ABNORMAL HIGH (ref 80.0–100.0)
Platelets: 74 10*3/uL — ABNORMAL LOW (ref 150–400)
RBC: 3.84 MIL/uL — ABNORMAL LOW (ref 4.22–5.81)
RDW: 12.9 % (ref 11.5–15.5)
WBC: 5.4 10*3/uL (ref 4.0–10.5)
nRBC: 0 % (ref 0.0–0.2)

## 2022-10-27 LAB — ECHOCARDIOGRAM COMPLETE
Area-P 1/2: 4.31 cm2
Height: 69 in
S' Lateral: 3 cm

## 2022-10-27 LAB — PHOSPHORUS: Phosphorus: 2.4 mg/dL — ABNORMAL LOW (ref 2.5–4.6)

## 2022-10-27 LAB — COMPREHENSIVE METABOLIC PANEL
ALT: 34 U/L (ref 0–44)
AST: 64 U/L — ABNORMAL HIGH (ref 15–41)
Albumin: 3.6 g/dL (ref 3.5–5.0)
Alkaline Phosphatase: 86 U/L (ref 38–126)
Anion gap: 8 (ref 5–15)
BUN: 24 mg/dL — ABNORMAL HIGH (ref 6–20)
CO2: 27 mmol/L (ref 22–32)
Calcium: 8.6 mg/dL — ABNORMAL LOW (ref 8.9–10.3)
Chloride: 100 mmol/L (ref 98–111)
Creatinine, Ser: 0.9 mg/dL (ref 0.61–1.24)
GFR, Estimated: >60 mL/min (ref >60)
Glucose, Bld: 136 mg/dL — ABNORMAL HIGH (ref 70–99)
Potassium: 3 mmol/L — ABNORMAL LOW (ref 3.5–5.1)
Sodium: 135 mmol/L (ref 135–145)
Total Bilirubin: 2.2 mg/dL — ABNORMAL HIGH (ref 0.3–1.2)
Total Protein: 6 g/dL — ABNORMAL LOW (ref 6.5–8.1)

## 2022-10-27 LAB — MAGNESIUM: Magnesium: 1.8 mg/dL (ref 1.7–2.4)

## 2022-10-27 MED ORDER — POTASSIUM CHLORIDE 10 MEQ/100ML IV SOLN
10.0000 meq | INTRAVENOUS | Status: AC
  Administered 2022-10-27 (×2): 10 meq via INTRAVENOUS
  Filled 2022-10-27 (×2): qty 100

## 2022-10-27 MED ORDER — POTASSIUM CHLORIDE CRYS ER 20 MEQ PO TBCR
40.0000 meq | EXTENDED_RELEASE_TABLET | ORAL | Status: AC
  Administered 2022-10-27 (×2): 40 meq via ORAL
  Filled 2022-10-27 (×2): qty 2

## 2022-10-27 MED ORDER — PHOS-NAK 280-160-250 MG PO PACK: 1.0000 | PACK | Freq: Three times a day (TID) | ORAL | 0 refills | Status: AC

## 2022-10-27 NOTE — Plan of Care (Signed)
  Problem: Coping: Goal: Level of anxiety will decrease 10/27/2022 0613 by Darrol Angel, RN Outcome: Progressing 10/26/2022 2013 by Darrol Angel, RN Outcome: Progressing   Problem: Elimination: Goal: Will not experience complications related to bowel motility Outcome: Progressing   Problem: Pain Managment: Goal: General experience of comfort will improve Outcome: Progressing   Problem: Safety: Goal: Ability to remain free from injury will improve Outcome: Progressing   Problem: Education: Goal: Knowledge of disease or condition will improve Outcome: Not Progressing   Problem: Health Behavior/Discharge Planning: Goal: Ability to identify changes in lifestyle to reduce recurrence of condition will improve Outcome: Not Progressing Goal: Identification of resources available to assist in meeting health care needs will improve Outcome: Not Progressing

## 2022-10-27 NOTE — Discharge Summary (Signed)
Physician Discharge Summary   Patient: Darrell Santiago MRN: [id number] DOB: 1965-06-03  Admit date:     10/26/2022  Discharge date: 10/27/22  Discharge Physician: Marrion Coy   PCP: Dana Allan, MD   Recommendations at discharge:   Follow-up with PCP in 1 week. Follow-up with Dr. Mariah Milling in 1 week for CT coronary angiogram.  Discharge Diagnoses: Principal Problem:   Hypokalemia Active Problems:   HTN (hypertension)   Chronic obstructive pulmonary disease (COPD) (HCC)   Alcohol dependence with alcohol-induced mood disorder (HCC)   Other chest pain   Alcoholic hepatitis without ascites   Hypomagnesemia   Mood disorder (HCC)   Thrombocytopenia (HCC)   History of TIA (transient ischemic attack)   Hyponatremia   AKI (acute kidney injury) (HCC) Mild hypophosphatemia. Resolved Problems:   * No resolved hospital problems. *  Hospital Course: Darrell Santiago is a 57 y.o. male with medical history significant of multiple medical issues including COPD, alcohol abuse, hyperlipidemia, hypertension, depression, TIA presenting with hypokalemia, chest pain, AKI.  Patient reports generalized malaise over the past 1 to 2 days.  Upon arriving the hospital, potassium was 2.2.  Also had acute renal failure with a creatinine of 2.09.  Patient was given IV fluids, replete potassium and magnesium.  Symptom much improved. Patient also had chest pain, troponin negative.  Chest pain most likely due to alcohol drinking with esophagitis.  Discussed with Dr. Mariah Milling, he will schedule patient to have CT coronary angiogram as outpatient.  Currently, patient is symptom-free, medically stable to be discharged.  Assessment and Plan:  Hypokalemia Acute kidney injury. Hypomagnesemia. Mild hypophosphatemia. Condition appears to be secondary to alcohol drinking and dehydration.  Patient received IV fluids, renal function normalized.  Potassium still 3.0, will give 80 mEq of oral potassium and 20 mEq of IV potassium.   Continue potassium chronically. Magnesium level normalized. Phosphorus level 2.4, will give 1 day of phosphorus supplement.   Chest pain Probably related to alcohol drinking and hypokalemia.  Outpatient workup with Dr. Mariah Milling.   history of TIA Resume home treatment, follow-up with PCP.   alcohol abuse Advised to quit, follow-up with PCP as outpatient.  Patient does not have any evidence of alcohol withdrawal at this time.   tobacco abuse/COPD Advised to quit smoking.   hypertension Blood pressure not significant elevated, resumed amlodipine, discontinued losartan.   GERD - PPI        Consultants: None Procedures performed: None  Disposition: Home Diet recommendation:  Discharge Diet Orders (From admission, onward)     Start     Ordered   10/27/22 0000  Diet - low sodium heart healthy        10/27/22 1034           Cardiac diet DISCHARGE MEDICATION: Allergies as of 10/27/2022   No Known Allergies      Medication List     STOP taking these medications    atorvastatin 80 MG tablet Commonly known as: LIPITOR   clopidogrel 75 MG tablet Commonly known as: PLAVIX   losartan 50 MG tablet Commonly known as: COZAAR   Magnesium 500 MG Tabs   Multi-Vitamin tablet       TAKE these medications    acetaminophen 325 MG tablet Commonly known as: TYLENOL Take 2 tablets (650 mg total) by mouth every 6 (six) hours as needed for mild pain (or Fever >/= 101).   amLODipine 2.5 MG tablet Commonly known as: NORVASC Take 2.5 mg by mouth daily. What  changed: Another medication with the same name was removed. Continue taking this medication, and follow the directions you see here.   aspirin EC 81 MG tablet Take 1 tablet (81 mg total) by mouth daily. Swallow whole.   budesonide-formoterol 160-4.5 MCG/ACT inhaler Commonly known as: Symbicort Inhale 2 puffs into the lungs 2 (two) times daily. Rinse mouth after use   cyanocobalamin 1000 MCG tablet Commonly  known as: VITAMIN B12 Take 1,000 mcg by mouth daily.   hydrOXYzine 25 MG tablet Commonly known as: ATARAX Take 1 tablet (25 mg total) by mouth daily as needed.   ipratropium-albuterol 0.5-2.5 (3) MG/3ML Soln Commonly known as: DUONEB Inhale 3 mLs into the lungs every 6 (six) hours as needed.   Phos-NaK 280-160-250 MG Pack Generic drug: potassium & sodium phosphates Take 1 packet by mouth 4 (four) times daily -  with meals and at bedtime for 1 day.   potassium chloride SA 20 MEQ tablet Commonly known as: KLOR-CON M Take 1 tablet (20 mEq total) by mouth 2 (two) times daily.   pravastatin 40 MG tablet Commonly known as: PRAVACHOL Take 40 mg by mouth at bedtime.   sertraline 50 MG tablet Commonly known as: ZOLOFT Take 1 tablet (50 mg total) by mouth daily at 12 noon.   thiamine 100 MG tablet Commonly known as: Vitamin B-1 Take 1 tablet (100 mg total) by mouth daily.        Follow-up Information     Dana Allan, MD Follow up in 1 week(s).   Specialty: Family Medicine Contact information: [address] [place] [address] [phone]         Antonieta Iba, MD Follow up in 1 week(s).   Specialty: Cardiology Contact information: [address] [place] [address] [phone]                Discharge Exam: Ceasar Mons Weights   10/26/22 1045  Weight: 76.7 kg   General exam: Appears calm and comfortable  Respiratory system: Clear to auscultation. Respiratory effort normal. Cardiovascular system: S1 & S2 heard, RRR. No JVD, murmurs, rubs, gallops or clicks. No pedal edema. Gastrointestinal system: Abdomen is nondistended, soft and nontender. No organomegaly or masses felt. Normal bowel sounds heard. Central nervous system: Alert and oriented. No focal neurological deficits. Extremities: Symmetric 5 x 5 power. Skin: No rashes, lesions or ulcers Psychiatry: Judgement and insight appear normal. Mood & affect appropriate.     Condition at discharge: good  The results of significant diagnostics from this hospitalization (including imaging, microbiology, ancillary and laboratory) are listed below for reference.   Imaging Studies: ECHOCARDIOGRAM COMPLETE  Result Date: 10/27/2022    ECHOCARDIOGRAM REPORT   Patient Name:   Darrell Santiago Date of Exam: 10/26/2022 Medical Rec #:  [id number]      Height:       69.0 in Accession #:    [id number]     Weight:       169.0 lb Date of Birth:  June 02, 1965      BSA:          1.923 m Patient Age:    57 years       BP:           139/98 mmHg Patient Gender: M              HR:           88 bpm. Exam Location:  [facility] Procedure: 2D Echo, Cardiac Doppler and Color Doppler  Indications:     R07.9 Chest pain  History:         Patient has prior history of Echocardiogram examinations, most                  recent 07/01/2022. Abnormal ECG, COPD; Risk Factors:Hypertension                  and Dyslipidemia.  Sonographer:     Daphine Deutscher RDCS Referring Phys:  [id number] Francoise Schaumann [place] Diagnosing Phys: Julien Nordmann MD IMPRESSIONS  1. Left ventricular ejection fraction, by estimation, is 60 to 65%. The left ventricle has normal function. The left ventricle has no regional wall motion abnormalities. There is mild left ventricular hypertrophy. Left ventricular diastolic parameters are consistent with Grade I diastolic dysfunction (impaired relaxation).  2. Right ventricular systolic function is normal. The right ventricular size is normal. Tricuspid regurgitation signal is inadequate for assessing PA pressure.  3. The mitral valve is normal in structure. No evidence of mitral valve regurgitation. No evidence of mitral stenosis.  4. The aortic valve is normal in structure. Aortic valve regurgitation is not visualized. No aortic stenosis is present.  5. The inferior vena cava is normal in size with greater than 50% respiratory variability, suggesting right atrial pressure of 3 mmHg. FINDINGS  Left Ventricle:  Left ventricular ejection fraction, by estimation, is 60 to 65%. The left ventricle has normal function. The left ventricle has no regional wall motion abnormalities. The left ventricular internal cavity size was normal in size. There is  mild left ventricular hypertrophy. Left ventricular diastolic parameters are consistent with Grade I diastolic dysfunction (impaired relaxation). Right Ventricle: The right ventricular size is normal. No increase in right ventricular wall thickness. Right ventricular systolic function is normal. Tricuspid regurgitation signal is inadequate for assessing PA pressure. Left Atrium: Left atrial size was normal in size. Right Atrium: Right atrial size was normal in size. Pericardium: There is no evidence of pericardial effusion. Mitral Valve: The mitral valve is normal in structure. No evidence of mitral valve regurgitation. No evidence of mitral valve stenosis. Tricuspid Valve: The tricuspid valve is normal in structure. Tricuspid valve regurgitation is mild . No evidence of tricuspid stenosis. Aortic Valve: The aortic valve is normal in structure. Aortic valve regurgitation is not visualized. No aortic stenosis is present. Pulmonic Valve: The pulmonic valve was normal in structure. Pulmonic valve regurgitation is not visualized. No evidence of pulmonic stenosis. Aorta: The aortic root is normal in size and structure. Venous: The inferior vena cava is normal in size with greater than 50% respiratory variability, suggesting right atrial pressure of 3 mmHg. IAS/Shunts: No atrial level shunt detected by color flow Doppler.  LEFT VENTRICLE PLAX 2D LVIDd:         4.80 cm   Diastology LVIDs:         3.00 cm   LV e' medial:    6.58 cm/s LV PW:         0.90 cm   LV E/e' medial:  9.3 LV IVS:        0.90 cm   LV e' lateral:   8.22 cm/s LVOT diam:     2.10 cm   LV E/e' lateral: 7.4 LV SV:         69 LV SV Index:   36 LVOT Area:     3.46 cm  RIGHT VENTRICLE             IVC RV Basal diam:  4.50 cm      IVC diam: 1.30 cm RV S prime:     14.37 cm/s TAPSE (M-mode): 2.1 cm LEFT ATRIUM             Index        RIGHT ATRIUM           Index LA diam:        4.10 cm 2.13 cm/m   RA Area:     15.90 cm LA Vol (A2C):   33.2 ml 17.26 ml/m  RA Volume:   47.60 ml  24.75 ml/m LA Vol (A4C):   38.2 ml 19.86 ml/m LA Biplane Vol: 35.7 ml 18.56 ml/m  AORTIC VALVE LVOT Vmax:   126.33 cm/s LVOT Vmean:  81.367 cm/s LVOT VTI:    0.198 m  AORTA Ao Root diam: 3.30 cm Ao Asc diam:  3.50 cm MITRAL VALVE MV Area (PHT): 4.31 cm     SHUNTS MV Decel Time: 176 msec     Systemic VTI:  0.20 m MV E velocity: 61.05 cm/s   Systemic Diam: 2.10 cm MV A velocity: 100.00 cm/s MV E/A ratio:  0.61 Julien Nordmann MD Electronically signed by Julien Nordmann MD Signature Date/Time: 10/27/2022/7:53:03 AM    Final    US RENAL  Result Date: 10/27/2022 CLINICAL DATA:  Acute kidney injury.  [id number] EXAM: RENAL / URINARY TRACT ULTRASOUND COMPLETE COMPARISON:  CT with IV contrast 11/11/2021. FINDINGS: Right Kidney: Renal measurements: 11.2 x 5.6 x 5.8 cm = volume: 189.4 mL. Echogenicity within normal limits. No mass or hydronephrosis visualized. Left Kidney: Renal measurements: 11.0 x 6.8 x 5.6 cm = volume: 217.8 mL. Echogenicity within normal limits. No mass or hydronephrosis visualized. There is a 1.7 cm simple cyst in the superior pole. In the anterior midpole there is an exophytic 1.6 cm simple cyst. Bladder: Appears normal for degree of bladder distention. Bilateral ureteral jets are confirmed. Other: None. IMPRESSION: 1. No hydronephrosis, visible intrarenal stones, or abnormal cortical echogenicity. 2. Simple cysts in the left kidney. Electronically Signed   By: Almira Bar M.D.   On: 10/27/2022 06:17   DG Chest Portable 1 View  Result Date: 10/26/2022 CLINICAL DATA:  Chest pain EXAM: PORTABLE CHEST 1 VIEW COMPARISON:  12/30/2021 FINDINGS: Lungs are clear. No pneumothorax or pleural effusion. Cardiac size within normal limits. Pulmonary  vascularity is normal. No acute bone abnormality. IMPRESSION: No active disease. Electronically Signed   By: Helyn Numbers M.D.   On: 10/26/2022 11:50    Microbiology: Results for orders placed or performed during the hospital encounter of 10/26/22  Blood culture (single)     Status: None (Preliminary result)   Collection Time: 10/26/22  1:55 PM   Specimen: BLOOD  Result Value Ref Range Status   Specimen Description BLOOD BLOOD RIGHT ARM  Final   Special Requests   Final    BOTTLES DRAWN AEROBIC AND ANAEROBIC Blood Culture adequate volume   Culture   Final    NO GROWTH < 24 HOURS Performed at [facility], [address], [place], [place] [address]    Report Status PENDING  Incomplete    Labs: CBC: Recent Labs  Lab 10/26/22 1102 10/27/22 0638  WBC 8.7 5.4  HGB 16.3 13.4  HCT 46.6 39.0  MCV 98.9 101.6*  PLT 108* 74*   Basic Metabolic Panel: Recent Labs  Lab 10/26/22 1145 10/26/22 1247 10/27/22 0638  NA 134*  --  135  K 2.2*  --  3.0*  CL 91*  --  100  CO2 26  --  27  GLUCOSE 121*  --  136*  BUN 29*  --  24*  CREATININE 2.09*  --  0.90  CALCIUM 9.1  --  8.6*  MG  --  1.4* 1.8  PHOS  --   --  2.4*   Liver Function Tests: Recent Labs  Lab 10/26/22 1145 10/27/22 0638  AST 114* 64*  ALT 45* 34  ALKPHOS 100 86  BILITOT 1.9* 2.2*  PROT 7.3 6.0*  ALBUMIN 4.5 3.6   CBG: No results for input(s): "GLUCAP" in the last 168 hours.  Discharge time spent: greater than 30 minutes.  Signed: Marrion Coy, MD [facility] 10/27/2022

## 2022-10-27 NOTE — Hospital Course (Signed)
Darrell Santiago is a 57 y.o. male with medical history significant of multiple medical issues including COPD, alcohol abuse, hyperlipidemia, hypertension, depression, TIA presenting with hypokalemia, chest pain, AKI.  Patient reports generalized malaise over the past 1 to 2 days.  Upon arriving the hospital, potassium was 2.2.  Also had acute renal failure with a creatinine of 2.09.  Patient was given IV fluids, replete potassium and magnesium.  Symptom much improved. Patient also had chest pain, troponin negative.  Chest pain most likely due to alcohol drinking with esophagitis.  Discussed with Dr. Mariah Milling, he will schedule patient to have CT coronary angiogram as outpatient.  Currently, patient is symptom-free, medically stable to be discharged.

## 2022-10-27 NOTE — Discharge Instructions (Signed)
                  Intensive Outpatient Programs  [facility]    [facility] [address]     [address] [place],  [place]     [place], [place] [phone]      [phone]  [facility]   [facility]  (Inpatient and outpatient)  [phone] (Suboxone and Methadone) [address]           [phone]           [facility]    [facility] - Intensive Outpatient [address]     [address] [place], [place] [address]     [place], [place]  [phone]      [phone]  [facility] (Outpatient, Inpatient, [facility]  [facility] (Groups and [facility]) (insurance only) [phone]    [place], [place]          [phone]       [facility]    [facility] (for caregivers and family) [address]     [address] [place], [place]     [place], [place] [phone]      [phone]  Residential Treatment Programs  [facility]  [facility](2 years) Residential: 90 days)  [facility] [address]      [address] [place], [place]     [place], [place] [phone]      [phone] or [phone]  [facility]    [facility] [address]      [address] [place], [place]     [place], [place] [phone]      [phone]  [facility]   [facility] ([facility] [address]     [address] [place], [place] [address]     [place], [place] [phone]      [phone] Admissions: 8am-3pm M-F  [facility]: Residential Program (90 Days)              [facility]: [facility]  [place], [place]     [place],   [phone] or [phone]    (Walk in Hours over the weekend or by referral)   [facility]: [facility]:[phone] (for crisis  response 24 hours a day) 

## 2022-10-27 NOTE — TOC Progression Note (Signed)
Transition of Care [facility]) - Progression Note    Patient Details  Name: Darrell Santiago MRN: [id number] Date of Birth: [birth year]  Transition of Care [facility]) CM/SW Contact  Marlowe Sax, RN Phone Number: 10/27/2022, 10:42 AM  Clinical Narrative:     Transition of Care [facility]) - Inpatient Brief Assessment   Patient Details  Name: Darrell Santiago MRN: [id number] Date of Birth: Jun 03, 1965  Transition of Care [facility]) CM/SW Contact:    Marlowe Sax, RN Phone Number: 10/27/2022, 10:42 AM   Clinical Narrative: Added Substance abuse resources to the AVS to print at Discharge.  Transition of Care [facility]) - Inpatient Brief Assessment   Patient Details  Name: Darrell Santiago MRN: [id number] Date of Birth: [birth year]  Transition of Care [facility]) CM/SW Contact:    Marlowe Sax, RN Phone Number: 10/27/2022, 10:43 AM   Clinical Narrative:    Transition of Care Asessment: Insurance and Status: Insurance coverage has been reviewed Patient has primary care physician: Yes Clent Ridges, Kenney Houseman, MD) Home environment has been reviewed: yes home with Bonnye Fava  Spouse Prior level of function:: independent Prior/Current Home Services: No current home services Social Determinants of Health Reivew: SDOH reviewed interventions complete Readmission risk has been reviewed: Yes Transition of care needs: no transition of care needs at this time    Transition of Care Asessment: Insurance and Status: Insurance coverage has been reviewed Patient has primary care physician: Yes Clent Ridges, Kenney Houseman, MD) Home environment has been reviewed: yes home with Bonnye Fava  Spouse Prior level of function:: independent Prior/Current Home Services: No current home services Social Determinants of Health Reivew: SDOH reviewed interventions complete Readmission risk has been reviewed: Yes Transition of care needs: no transition of care needs at this time        Expected Discharge Plan and Services          Expected Discharge Date: 10/27/22                                     Social Determinants of Health (SDOH) Interventions SDOH Screenings   Food Insecurity: No Food Insecurity (10/26/2022)  Housing: Low Risk  (10/26/2022)  Transportation Needs: No Transportation Needs (10/26/2022)  Utilities: Not At Risk (10/26/2022)  Depression (PHQ2-9): Low Risk  (08/09/2022)  Tobacco Use: High Risk (10/26/2022)    Readmission Risk Interventions     No data to display

## 2022-10-28 ENCOUNTER — Telehealth: Payer: Self-pay

## 2022-10-28 NOTE — Transitions of Care (Post Inpatient/ED Visit) (Signed)
10/28/2022  Name: Darrell Santiago MRN: [id number] DOB: 1965-07-12  Today's TOC FU Call Status: Today's TOC FU Call Status:: Successful TOC FU Call Competed TOC FU Call Complete Date: 10/28/22  Transition Care Management Follow-up Telephone Call Date of Discharge: 10/27/22 Discharge Facility: [facility]) Type of Discharge: Inpatient Admission Primary Inpatient Discharge Diagnosis:: weakness How have you been since you were released from the hospital?: Better Any questions or concerns?: No  Items Reviewed: Did you receive and understand the discharge instructions provided?: Yes Any new allergies since your discharge?: No Dietary orders reviewed?: Yes Do you have support at home?: Yes People in Home: spouse  Medications Reviewed Today: Medications Reviewed Today     Reviewed by Karena Addison, LPN (Licensed Practical Nurse) on 10/28/22 at 0914  Med List Status: <None>   Medication Order Taking? Sig Documenting Provider Last Dose Status Informant  acetaminophen (TYLENOL) 325 MG tablet [id number] No Take 2 tablets (650 mg total) by mouth every 6 (six) hours as needed for mild pain (or Fever >/= 101). Enid Baas, MD Past Week Active Spouse/Significant Other  amLODipine (NORVASC) 2.5 MG tablet [id number] No Take 2.5 mg by mouth daily. [provider] 10/25/2022 Active   aspirin EC 81 MG tablet [id number] No Take 1 tablet (81 mg total) by mouth daily. Swallow whole. Gillis Santa, MD 10/25/2022 Active   budesonide-formoterol [facility]) 160-4.5 MCG/ACT inhaler [id number]  Inhale 2 puffs into the lungs 2 (two) times daily. Rinse mouth after use Dana Allan, MD  Active   hydrOXYzine (ATARAX) 25 MG tablet [id number] No Take 1 tablet (25 mg total) by mouth daily as needed. Dana Allan, MD 10/25/2022 Active   ipratropium-albuterol (DUONEB) 0.5-2.5 (3) MG/3ML SOLN [id number] No Inhale 3 mLs into the lungs every 6 (six) hours as needed. Dana Allan, MD Past  Week Active   potassium & sodium phosphates (PHOS-NAK) 280-160-250 MG PACK [id number]  Take 1 packet by mouth 4 (four) times daily -  with meals and at bedtime for 1 day. Marrion Coy, MD  Active   potassium chloride SA (KLOR-CON M) 20 MEQ tablet [id number] No Take 1 tablet (20 mEq total) by mouth 2 (two) times daily. Dana Allan, MD Past Week Active   pravastatin (PRAVACHOL) 40 MG tablet [id number] No Take 40 mg by mouth at bedtime. [provider] 10/25/2022 Active   sertraline (ZOLOFT) 50 MG tablet [id number] No Take 1 tablet (50 mg total) by mouth daily at 12 noon. Dana Allan, MD 10/25/2022 Active   thiamine (VITAMIN B-1) 100 MG tablet [id number] No Take 1 tablet (100 mg total) by mouth daily. Darlin Priestly, MD 10/25/2022 Active Spouse/Significant Other  vitamin B-12 (CYANOCOBALAMIN) 1000 MCG tablet [id number] No Take 1,000 mcg by mouth daily.  [provider] 10/25/2022 Active Spouse/Significant Other            Home Care and Equipment/Supplies: Were Home Health Services Ordered?: NA Any new equipment or medical supplies ordered?: NA  Functional Questionnaire: Do you need assistance with bathing/showering or dressing?: No Do you need assistance with meal preparation?: No Do you need assistance with eating?: No Do you have difficulty maintaining continence: No Do you need assistance with getting out of bed/getting out of a chair/moving?: No  Follow up appointments reviewed: PCP Follow-up appointment confirmed?: Yes Date of PCP follow-up appointment?: 11/10/22 Follow-up Provider: [facility] Follow-up appointment confirmed?: NA Do you need transportation to your follow-up appointment?: No Do you understand care options if your condition(s) worsen?: Yes-patient verbalized  understanding    SIGNATURE Karena Addison, LPN [facility] Nurse Health Advisor Direct Dial [phone]

## 2022-10-31 LAB — CULTURE, BLOOD (SINGLE)
Culture: NO GROWTH
Special Requests: ADEQUATE

## 2022-11-10 ENCOUNTER — Ambulatory Visit: Payer: BC Managed Care – PPO | Admitting: Family Medicine

## 2022-11-27 ENCOUNTER — Telehealth: Payer: BC Managed Care – PPO | Admitting: Family

## 2022-11-27 DIAGNOSIS — J069 Acute upper respiratory infection, unspecified: Secondary | ICD-10-CM

## 2022-11-27 MED ORDER — CETIRIZINE HCL 10 MG PO TABS: 10.0000 mg | ORAL_TABLET | Freq: Every day | ORAL | 1 refills | Status: AC

## 2022-11-27 MED ORDER — FLUTICASONE PROPIONATE 50 MCG/ACT NA SUSP: 2.0000 | Freq: Every day | NASAL | 6 refills | Status: AC

## 2022-11-27 MED ORDER — BENZONATATE 100 MG PO CAPS: 100.0000 mg | ORAL_CAPSULE | Freq: Three times a day (TID) | ORAL | 0 refills | Status: DC | PRN

## 2022-11-27 NOTE — Progress Notes (Signed)
E-Visit for Upper Respiratory Infection  ° °We are sorry you are not feeling well.  Here is how we plan to help! ° °Based on what you have shared with me, it looks like you may have a viral upper respiratory infection.  Upper respiratory infections are caused by a large number of viruses; however, rhinovirus is the most common cause.  ° °Symptoms vary from person to person, with common symptoms including sore throat, cough, fatigue or lack of energy and feeling of general discomfort.  A low-grade fever of up to 100.4 may present, but is often uncommon.  Symptoms vary however, and are closely related to a person's age or underlying illnesses.  The most common symptoms associated with an upper respiratory infection are nasal discharge or congestion, cough, sneezing, headache and pressure in the ears and face.  These symptoms usually persist for about 3 to 10 days, but can last up to 2 weeks.  It is important to know that upper respiratory infections do not cause serious illness or complications in most cases.   ° °Upper respiratory infections can be transmitted from person to person, with the most common method of transmission being a person's hands.  The virus is able to live on the skin and can infect other persons for up to 2 hours after direct contact.  Also, these can be transmitted when someone coughs or sneezes; thus, it is important to cover the mouth to reduce this risk.  To keep the spread of the illness at bay, good hand hygiene is very important. ° °This is an infection that is most likely caused by a virus. There are no specific treatments other than to help you with the symptoms until the infection runs its course.  We are sorry you are not feeling well.  Here is how we plan to help! ° ° °For nasal congestion, you may use an oral decongestants such as Mucinex D or if you have glaucoma or high blood pressure use plain Mucinex.  Saline nasal spray or nasal drops can help and can safely be used as often as  needed for congestion.  For your congestion, I have prescribed Fluticasone nasal spray one spray in each nostril twice a day and zyrtec 10 mg daily.  ° °If you do not have a history of heart disease, hypertension, diabetes or thyroid disease, prostate/bladder issues or glaucoma, you may also use Sudafed to treat nasal congestion.  It is highly recommended that you consult with a pharmacist or your primary care physician to ensure this medication is safe for you to take.    ° °If you have a cough, you may use cough suppressants such as Delsym and Robitussin.  If you have glaucoma or high blood pressure, you can also use Coricidin HBP.   °For cough I have prescribed for you A prescription cough medication called Tessalon Perles 100 mg. You may take 1-2 capsules every 8 hours as needed for cough ° °If you have a sore or scratchy throat, use a saltwater gargle- ¼ to ½ teaspoon of salt dissolved in a 4-ounce to 8-ounce glass of warm water.  Gargle the solution for approximately 15-30 seconds and then spit.  It is important not to swallow the solution.  You can also use throat lozenges/cough drops and Chloraseptic spray to help with throat pain or discomfort.  Warm or cold liquids can also be helpful in relieving throat pain. ° °For headache, pain or general discomfort, you can use Ibuprofen or Tylenol as   directed.   °Some authorities believe that zinc sprays or the use of Echinacea may shorten the course of your symptoms. ° ° °HOME CARE °Only take medications as instructed by your medical team. °Be sure to drink plenty of fluids. Water is fine as well as fruit juices, sodas and electrolyte beverages. You may want to stay away from caffeine or alcohol. If you are nauseated, try taking small sips of liquids. How do you know if you are getting enough fluid? Your urine should be a pale yellow or almost colorless. °Get rest. °Taking a steamy shower or using a humidifier may help nasal congestion and ease sore throat pain. You  can place a towel over your head and breathe in the steam from hot water coming from a faucet. °Using a saline nasal spray works much the same way. °Cough drops, hard candies and sore throat lozenges may ease your cough. °Avoid close contacts especially the very young and the elderly °Cover your mouth if you cough or sneeze °Always remember to wash your hands.  ° °GET HELP RIGHT AWAY IF: °You develop worsening fever. °If your symptoms do not improve within 10 days °You develop yellow or green discharge from your nose over 3 days. °You have coughing fits °You develop a severe head ache or visual changes. °You develop shortness of breath, difficulty breathing or start having chest pain °Your symptoms persist after you have completed your treatment plan ° °MAKE SURE YOU  °Understand these instructions. °Will watch your condition. °Will get help right away if you are not doing well or get worse. ° °Thank you for choosing an e-visit. ° °Your e-visit answers were reviewed by a board certified advanced clinical practitioner to complete your personal care plan. Depending upon the condition, your plan could have included both over the counter or prescription medications. ° °Please review your pharmacy choice. Make sure the pharmacy is open so you can pick up prescription now. If there is a problem, you may contact your provider through MyChart messaging and have the prescription routed to another pharmacy.  Your safety is important to us. If you have drug allergies check your prescription carefully.  ° °For the next 24 hours you can use MyChart to ask questions about today's visit, request a non-urgent call back, or ask for a work or school excuse. °You will get an email in the next two days asking about your experience. I hope that your e-visit has been valuable and will speed your recovery. ° °Approximately 5 minutes was spent documenting and reviewing patient's chart.  ° ° ° °

## 2022-12-06 ENCOUNTER — Encounter: Payer: Self-pay | Admitting: *Deleted

## 2022-12-24 ENCOUNTER — Other Ambulatory Visit: Payer: Self-pay | Admitting: Family

## 2022-12-24 NOTE — Addendum Note (Signed)
Addended by: Jannifer Rodney A on: 12/24/2022 07:32 AM   Modules accepted: Level of Service

## 2023-05-02 ENCOUNTER — Emergency Department
Admission: EM | Admit: 2023-05-02 | Discharge: 2023-05-02 | Disposition: A | Discharge status: Home / Self Care | Payer: BC Managed Care – PPO | Attending: Emergency Medicine | Admitting: Emergency Medicine

## 2023-05-02 ENCOUNTER — Other Ambulatory Visit: Payer: Self-pay

## 2023-05-02 DIAGNOSIS — I10 Essential (primary) hypertension: Secondary | ICD-10-CM | POA: Insufficient documentation

## 2023-05-02 DIAGNOSIS — E876 Hypokalemia: Secondary | ICD-10-CM | POA: Insufficient documentation

## 2023-05-02 DIAGNOSIS — M7989 Other specified soft tissue disorders: Secondary | ICD-10-CM | POA: Diagnosis present

## 2023-05-02 DIAGNOSIS — F1024 Alcohol dependence with alcohol-induced mood disorder: Secondary | ICD-10-CM

## 2023-05-02 DIAGNOSIS — J449 Chronic obstructive pulmonary disease, unspecified: Secondary | ICD-10-CM | POA: Insufficient documentation

## 2023-05-02 DIAGNOSIS — R600 Localized edema: Secondary | ICD-10-CM | POA: Diagnosis not present

## 2023-05-02 LAB — CBC
HCT: 41.7 % (ref 39.0–52.0)
Hemoglobin: 14.1 g/dL (ref 13.0–17.0)
MCH: 35.3 pg — ABNORMAL HIGH (ref 26.0–34.0)
MCHC: 33.8 g/dL (ref 30.0–36.0)
MCV: 104.3 fL — ABNORMAL HIGH (ref 80.0–100.0)
Platelets: 257 10*3/uL (ref 150–400)
RBC: 4 MIL/uL — ABNORMAL LOW (ref 4.22–5.81)
RDW: 13.7 % (ref 11.5–15.5)
WBC: 5.4 10*3/uL (ref 4.0–10.5)
nRBC: 0 % (ref 0.0–0.2)

## 2023-05-02 LAB — COMPREHENSIVE METABOLIC PANEL
ALT: 29 U/L (ref 0–44)
AST: 60 U/L — ABNORMAL HIGH (ref 15–41)
Albumin: 3.8 g/dL (ref 3.5–5.0)
Alkaline Phosphatase: 96 U/L (ref 38–126)
Anion gap: 11 (ref 5–15)
BUN: 8 mg/dL (ref 6–20)
CO2: 33 mmol/L — ABNORMAL HIGH (ref 22–32)
Calcium: 9.9 mg/dL (ref 8.9–10.3)
Chloride: 100 mmol/L (ref 98–111)
Creatinine, Ser: 0.59 mg/dL — ABNORMAL LOW (ref 0.61–1.24)
GFR, Estimated: >60 mL/min (ref >60)
Glucose, Bld: 102 mg/dL — ABNORMAL HIGH (ref 70–99)
Potassium: 2.8 mmol/L — ABNORMAL LOW (ref 3.5–5.1)
Sodium: 144 mmol/L (ref 135–145)
Total Bilirubin: 0.8 mg/dL (ref 0.0–1.2)
Total Protein: 7.5 g/dL (ref 6.5–8.1)

## 2023-05-02 LAB — BRAIN NATRIURETIC PEPTIDE: B Natriuretic Peptide: 69.8 pg/mL (ref 0.0–100.0)

## 2023-05-02 MED ORDER — FUROSEMIDE 20 MG PO TABS: 20.0000 mg | ORAL_TABLET | Freq: Every day | ORAL | 1 refills | Status: DC

## 2023-05-02 NOTE — ED Provider Notes (Signed)
 [facility] Provider Note    Event Date/Time   First MD Initiated Contact with Patient 05/02/23 1157     (approximate)   History   Leg Swelling   HPI  Darrell Santiago is a 58 y.o. male with a history of COPD, hypertension, hyperlipidemia who presents with complaints of lower extremity swelling over the last couple of weeks.  He reports it feels tight in his legs, denies rash or fevers.  Denies shortness of breath able to lay flat at night     Physical Exam   Triage Vital Signs: ED Triage Vitals  Encounter Vitals Group     BP 05/02/23 0918 107/79     Systolic BP Percentile --      Diastolic BP Percentile --      Pulse Rate 05/02/23 0918 66     Resp 05/02/23 0918 18     Temp 05/02/23 0918 98.3 F (36.8 C)     Temp Source 05/02/23 0918 Oral     SpO2 05/02/23 0918 96 %     Weight 05/02/23 0920 76.7 kg (169 lb 1.5 oz)     Height 05/02/23 0920 1.753 m (5' 9)     Head Circumference --      Peak Flow --      Pain Score 05/02/23 0920 7     Pain Loc --      Pain Education --      Exclude from Growth Chart --     Most recent vital signs: Vitals:   05/02/23 0918  BP: 107/79  Pulse: 66  Resp: 18  Temp: 98.3 F (36.8 C)  SpO2: 96%     General: Awake, no distress.  CV:  Good peripheral perfusion.  Resp:  Normal effort.  Abd:  No distention.  Other:  Mild edema in the legs bilaterally, no rash to suggest cellulitis warm and well-perfused   ED Results / Procedures / Treatments   Labs (all labs ordered are listed, but only abnormal results are displayed) Labs Reviewed  CBC - Abnormal; Notable for the following components:      Result Value   RBC 4.00 (*)    MCV 104.3 (*)    MCH 35.3 (*)    All other components within normal limits  COMPREHENSIVE METABOLIC PANEL - Abnormal; Notable for the following components:   Potassium 2.8 (*)    CO2 33 (*)    Glucose, Bld 102 (*)    Creatinine, Ser 0.59 (*)    AST 60 (*)    All other  components within normal limits  BRAIN NATRIURETIC PEPTIDE     EKG     RADIOLOGY     PROCEDURES:  Critical Care performed:   Procedures   MEDICATIONS ORDERED IN ED: Medications - No data to display   IMPRESSION / MDM / ASSESSMENT AND PLAN / ED COURSE  I reviewed the triage vital signs and the nursing notes. Patient's presentation is most consistent with exacerbation of chronic illness.  Patient presents with bilateral edema as described above, overall lab work is reassuring, mild hypokalemia which is chronic otherwise kidney function is reassuring  No shortness of breath to suggest pulmonary edema  Will start the patient on low-dose Lasix  encouraged him to be sure to take his K-Dur he will follow-up with his PCP closely        FINAL CLINICAL IMPRESSION(S) / ED DIAGNOSES   Final diagnoses:  Leg edema     Rx / DC  Orders   ED Discharge Orders          Ordered    furosemide  (LASIX ) 20 MG tablet  Daily        05/02/23 1210             Note:  This document was prepared using Dragon voice recognition software and may include unintentional dictation errors.   Arlander Charleston, MD 05/02/23 [phone]

## 2023-05-02 NOTE — Progress Notes (Signed)
 To ER for further evaluation and management of bilateral leg redness, swelling, moderate to severe pain of uncertain etiology  Vitals:   05/02/23 0830  BP: 109/70  Pulse: 70  Temp: 36.2 C (97.1 F)  TempSrc: Oral  SpO2: 94%  Weight: 74.2 kg (163 lb 9.6 oz)  Height: 176.5 cm (5' 9.5)   Body mass index is 23.81 kg/m.

## 2023-05-02 NOTE — ED Triage Notes (Signed)
 Pt here with bilateral leg swelling since Wed. Pt denies any other symptoms. Pt states legs are also painful.

## 2023-05-12 ENCOUNTER — Telehealth: Payer: BC Managed Care – PPO | Admitting: Physician Assistant

## 2023-05-12 DIAGNOSIS — R6889 Other general symptoms and signs: Secondary | ICD-10-CM | POA: Diagnosis not present

## 2023-05-12 MED ORDER — OSELTAMIVIR PHOSPHATE 75 MG PO CAPS: 75.0000 mg | ORAL_CAPSULE | Freq: Two times a day (BID) | ORAL | 0 refills | Status: DC

## 2023-05-12 MED ORDER — BENZONATATE 100 MG PO CAPS
100.0000 mg | ORAL_CAPSULE | Freq: Three times a day (TID) | ORAL | 0 refills | Status: DC | PRN
Start: 2023-05-12 — End: 2023-12-19

## 2023-05-12 NOTE — Progress Notes (Signed)
E visit for Flu like symptoms   We are sorry that you are not feeling well.  Here is how we plan to help! Based on what you have shared with me it looks like you may have a respiratory virus that may be influenza.  Influenza or "the flu" is   an infection caused by a respiratory virus. The flu virus is highly contagious and persons who did not receive their yearly flu vaccination may "catch" the flu from close contact.  We have anti-viral medications to treat the viruses that cause this infection. They are not a "cure" and only shorten the course of the infection. These prescriptions are most effective when they are given within the first 2 days of "flu" symptoms. Antiviral medication are indicated if you have a high risk of complications from the flu. You should  also consider an antiviral medication if you are in close contact with someone who is at risk. These medications can help patients avoid complications from the flu  but have side effects that you should know. Possible side effects from Tamiflu or oseltamivir include nausea, vomiting, diarrhea, dizziness, headaches, eye redness, sleep problems or other respiratory symptoms. You should not take Tamiflu if you have an allergy to oseltamivir or any to the ingredients in Tamiflu.  Based upon your symptoms and potential risk factors I have prescribed Oseltamivir (Tamiflu).  It has been sent to your designated pharmacy.  You will take one 75 mg capsule orally twice a day for the next 5 days. I have also prescribed Tessalon perles 100mg  capsule, take 1-2 capsules every 8 hours as needed for cough.   Can continue symptomatic medications as needed over the counter.  ANYONE WHO HAS FLU SYMPTOMS SHOULD: Stay home. The flu is highly contagious and going out or to work exposes others! Be sure to drink plenty of fluids. Water is fine as well as fruit juices, sodas and electrolyte beverages. You may want to stay away from caffeine or alcohol. If you are  nauseated, try taking small sips of liquids. How do you know if you are getting enough fluid? Your urine should be a pale yellow or almost colorless. Get rest. Taking a steamy shower or using a humidifier may help nasal congestion and ease sore throat pain. Using a saline nasal spray works much the same way. Cough drops, hard candies and sore throat lozenges may ease your cough. Line up a caregiver. Have someone check on you regularly.   GET HELP RIGHT AWAY IF: You cannot keep down liquids or your medications. You become short of breath Your fell like you are going to pass out or loose consciousness. Your symptoms persist after you have completed your treatment plan MAKE SURE YOU  Understand these instructions. Will watch your condition. Will get help right away if you are not doing well or get worse.  Your e-visit answers were reviewed by a board certified advanced clinical practitioner to complete your personal care plan.  Depending on the condition, your plan could have included both over the counter or prescription medications.  If there is a problem please reply  once you have received a response from your provider.  Your safety is important to [place].  If you have drug allergies check your prescription carefully.    You can use MyChart to ask questions about today's visit, request a non-urgent call back, or ask for a work or school excuse for 24 hours related to this e-Visit. If it has been greater than  24 hours you will need to follow up with your provider, or enter a new e-Visit to address those concerns.  You will get an e-mail in the next two days asking about your experience.  I hope that your e-visit has been valuable and will speed your recovery. Thank you for using e-visits.   I have spent 5 minutes in review of e-visit questionnaire, review and updating patient chart, medical decision making and response to patient.   Margaretann Loveless, PA-C

## 2023-05-14 ENCOUNTER — Telehealth: Payer: BC Managed Care – PPO | Admitting: Family Medicine

## 2023-05-14 DIAGNOSIS — R059 Cough, unspecified: Secondary | ICD-10-CM

## 2023-05-14 MED ORDER — PROMETHAZINE-DM 6.25-15 MG/5ML PO SYRP: 5.0000 mL | ORAL_SOLUTION | Freq: Four times a day (QID) | ORAL | 0 refills | Status: AC | PRN

## 2023-05-14 NOTE — Progress Notes (Signed)
E-Visit for Cough  We are sorry that you are not feeling well.  Here is how we plan to help!  Based on your presentation I believe you most likely have A cough due to a virus.  This is called viral bronchitis and is best treated by rest, plenty of fluids and control of the cough.  You may use Ibuprofen or Tylenol as directed to help your symptoms.     In addition you may use promethazine dm    From your responses in the eVisit questionnaire you describe inflammation in the upper respiratory tract which is causing a significant cough.  This is commonly called Bronchitis and has four common causes:   Allergies Viral Infections Acid Reflux Bacterial Infection Allergies, viruses and acid reflux are treated by controlling symptoms or eliminating the cause. An example might be a cough caused by taking certain blood pressure medications. You stop the cough by changing the medication. Another example might be a cough caused by acid reflux. Controlling the reflux helps control the cough.  USE OF BRONCHODILATOR ("RESCUE") INHALERS: There is a risk from using your bronchodilator too frequently.  The risk is that over-reliance on a medication which only relaxes the muscles surrounding the breathing tubes can reduce the effectiveness of medications prescribed to reduce swelling and congestion of the tubes themselves.  Although you feel brief relief from the bronchodilator inhaler, your asthma may actually be worsening with the tubes becoming more swollen and filled with mucus.  This can delay other crucial treatments, such as oral steroid medications. If you need to use a bronchodilator inhaler daily, several times per day, you should discuss this with your provider.  There are probably better treatments that could be used to keep your asthma under control.     HOME CARE Only take medications as instructed by your medical team. Complete the entire course of an antibiotic. Drink plenty of fluids and get  plenty of rest. Avoid close contacts especially the very young and the elderly Cover your mouth if you cough or cough into your sleeve. Always remember to wash your hands A steam or ultrasonic humidifier can help congestion.   GET HELP RIGHT AWAY IF: You develop worsening fever. You become short of breath You cough up blood. Your symptoms persist after you have completed your treatment plan MAKE SURE YOU  Understand these instructions. Will watch your condition. Will get help right away if you are not doing well or get worse.    Thank you for choosing an e-visit.  Your e-visit answers were reviewed by a board certified advanced clinical practitioner to complete your personal care plan. Depending upon the condition, your plan could have included both over the counter or prescription medications.  Please review your pharmacy choice. Make sure the pharmacy is open so you can pick up prescription now. If there is a problem, you may contact your provider through [employer] and have the prescription routed to another pharmacy.  Your safety is important to [place]. If you have drug allergies check your prescription carefully.   For the next 24 hours you can use MyChart to ask questions about today's visit, request a non-urgent call back, or ask for a work or school excuse. You will get an email in the next two days asking about your experience. I hope that your e-visit has been valuable and will speed your recovery.    have provided 5 minutes of non face to face time during this encounter for chart review  and documentation.

## 2023-06-25 ENCOUNTER — Other Ambulatory Visit: Payer: Self-pay | Admitting: Family Medicine

## 2023-06-25 DIAGNOSIS — F1024 Alcohol dependence with alcohol-induced mood disorder: Secondary | ICD-10-CM

## 2023-06-25 DIAGNOSIS — F39 Unspecified mood [affective] disorder: Secondary | ICD-10-CM

## 2023-06-25 DIAGNOSIS — G4700 Insomnia, unspecified: Secondary | ICD-10-CM

## 2023-06-27 ENCOUNTER — Observation Stay
Admission: EM | Admit: 2023-06-27 | Discharge: 2023-06-28 | Disposition: A | Discharge status: Home / Self Care | Attending: Internal Medicine | Admitting: Internal Medicine

## 2023-06-27 ENCOUNTER — Emergency Department

## 2023-06-27 ENCOUNTER — Other Ambulatory Visit: Payer: Self-pay

## 2023-06-27 ENCOUNTER — Observation Stay
Admit: 2023-06-27 | Discharge: 2023-06-27 | Disposition: A | Discharge status: Home / Self Care | Attending: Internal Medicine

## 2023-06-27 DIAGNOSIS — Z7901 Long term (current) use of anticoagulants: Secondary | ICD-10-CM | POA: Insufficient documentation

## 2023-06-27 DIAGNOSIS — F1020 Alcohol dependence, uncomplicated: Secondary | ICD-10-CM | POA: Diagnosis present

## 2023-06-27 DIAGNOSIS — F10239 Alcohol dependence with withdrawal, unspecified: Secondary | ICD-10-CM | POA: Diagnosis not present

## 2023-06-27 DIAGNOSIS — J449 Chronic obstructive pulmonary disease, unspecified: Secondary | ICD-10-CM | POA: Insufficient documentation

## 2023-06-27 DIAGNOSIS — R079 Chest pain, unspecified: Secondary | ICD-10-CM

## 2023-06-27 DIAGNOSIS — F17210 Nicotine dependence, cigarettes, uncomplicated: Secondary | ICD-10-CM | POA: Diagnosis not present

## 2023-06-27 DIAGNOSIS — F329 Major depressive disorder, single episode, unspecified: Secondary | ICD-10-CM | POA: Diagnosis not present

## 2023-06-27 DIAGNOSIS — E876 Hypokalemia: Secondary | ICD-10-CM | POA: Insufficient documentation

## 2023-06-27 DIAGNOSIS — F419 Anxiety disorder, unspecified: Secondary | ICD-10-CM | POA: Diagnosis not present

## 2023-06-27 DIAGNOSIS — I5032 Chronic diastolic (congestive) heart failure: Secondary | ICD-10-CM | POA: Insufficient documentation

## 2023-06-27 DIAGNOSIS — Z79899 Other long term (current) drug therapy: Secondary | ICD-10-CM | POA: Diagnosis not present

## 2023-06-27 DIAGNOSIS — I110 Hypertensive heart disease with heart failure: Secondary | ICD-10-CM | POA: Insufficient documentation

## 2023-06-27 DIAGNOSIS — R0789 Other chest pain: Secondary | ICD-10-CM | POA: Diagnosis present

## 2023-06-27 DIAGNOSIS — F1090 Alcohol use, unspecified, uncomplicated: Secondary | ICD-10-CM

## 2023-06-27 DIAGNOSIS — K219 Gastro-esophageal reflux disease without esophagitis: Secondary | ICD-10-CM | POA: Insufficient documentation

## 2023-06-27 LAB — BASIC METABOLIC PANEL
Anion gap: 13 (ref 5–15)
BUN: 13 mg/dL (ref 6–20)
CO2: 27 mmol/L (ref 22–32)
Calcium: 10 mg/dL (ref 8.9–10.3)
Chloride: 99 mmol/L (ref 98–111)
Creatinine, Ser: 0.53 mg/dL — ABNORMAL LOW (ref 0.61–1.24)
GFR, Estimated: >60 mL/min (ref >60)
Glucose, Bld: 106 mg/dL — ABNORMAL HIGH (ref 70–99)
Potassium: 2.5 mmol/L — CL (ref 3.5–5.1)
Sodium: 139 mmol/L (ref 135–145)

## 2023-06-27 LAB — CBC
HCT: 39.8 % (ref 39.0–52.0)
Hemoglobin: 13.8 g/dL (ref 13.0–17.0)
MCH: 35.8 pg — ABNORMAL HIGH (ref 26.0–34.0)
MCHC: 34.7 g/dL (ref 30.0–36.0)
MCV: 103.1 fL — ABNORMAL HIGH (ref 80.0–100.0)
Platelets: 173 10*3/uL (ref 150–400)
RBC: 3.86 MIL/uL — ABNORMAL LOW (ref 4.22–5.81)
RDW: 15.3 % (ref 11.5–15.5)
WBC: 9.7 10*3/uL (ref 4.0–10.5)
nRBC: 0 % (ref 0.0–0.2)

## 2023-06-27 LAB — TROPONIN I (HIGH SENSITIVITY)
Troponin I (High Sensitivity): 7 ng/L (ref <18)
Troponin I (High Sensitivity): 7 ng/L (ref <18)

## 2023-06-27 LAB — MAGNESIUM: Magnesium: 1.5 mg/dL — ABNORMAL LOW (ref 1.7–2.4)

## 2023-06-27 LAB — ETHANOL: Alcohol, Ethyl (B): 67 mg/dL — ABNORMAL HIGH (ref <10)

## 2023-06-27 LAB — POTASSIUM: Potassium: 3.6 mmol/L (ref 3.5–5.1)

## 2023-06-27 LAB — PHOSPHORUS: Phosphorus: 2.1 mg/dL — ABNORMAL LOW (ref 2.5–4.6)

## 2023-06-27 MED ORDER — POTASSIUM CHLORIDE CRYS ER 20 MEQ PO TBCR
40.0000 meq | EXTENDED_RELEASE_TABLET | Freq: Once | ORAL | Status: AC
  Administered 2023-06-27: 40 meq via ORAL
  Filled 2023-06-27: qty 2

## 2023-06-27 MED ORDER — POTASSIUM CHLORIDE 10 MEQ/100ML IV SOLN
10.0000 meq | INTRAVENOUS | Status: AC
  Administered 2023-06-27 (×2): 10 meq via INTRAVENOUS
  Filled 2023-06-27 (×2): qty 100

## 2023-06-27 MED ORDER — SODIUM CHLORIDE 0.9 % IV SOLN: INTRAVENOUS | Status: DC

## 2023-06-27 MED ORDER — AMLODIPINE BESYLATE 5 MG PO TABS
2.5000 mg | ORAL_TABLET | Freq: Every day | ORAL | Status: DC
  Administered 2023-06-27 – 2023-06-28 (×2): 2.5 mg via ORAL
  Filled 2023-06-27 (×2): qty 1

## 2023-06-27 MED ORDER — LORAZEPAM 2 MG/ML IJ SOLN: 0.0000 mg | Freq: Four times a day (QID) | INTRAMUSCULAR | Status: DC

## 2023-06-27 MED ORDER — LORATADINE 10 MG PO TABS
10.0000 mg | ORAL_TABLET | Freq: Every day | ORAL | Status: DC
  Administered 2023-06-27 – 2023-06-28 (×2): 10 mg via ORAL
  Filled 2023-06-27 (×2): qty 1

## 2023-06-27 MED ORDER — FLUTICASONE PROPIONATE 50 MCG/ACT NA SUSP: 2.0000 | Freq: Every day | NASAL | Status: DC | PRN

## 2023-06-27 MED ORDER — LORAZEPAM 2 MG PO TABS: 0.0000 mg | ORAL_TABLET | Freq: Four times a day (QID) | ORAL | Status: DC

## 2023-06-27 MED ORDER — FUROSEMIDE 40 MG PO TABS: 20.0000 mg | ORAL_TABLET | Freq: Every day | ORAL | Status: DC

## 2023-06-27 MED ORDER — LORAZEPAM 2 MG PO TABS: 0.0000 mg | ORAL_TABLET | Freq: Two times a day (BID) | ORAL | Status: DC

## 2023-06-27 MED ORDER — MAGNESIUM SULFATE 2 GM/50ML IV SOLN
2.0000 g | Freq: Once | INTRAVENOUS | Status: AC
  Administered 2023-06-27: 2 g via INTRAVENOUS
  Filled 2023-06-27: qty 50

## 2023-06-27 MED ORDER — THIAMINE MONONITRATE 100 MG PO TABS
100.0000 mg | ORAL_TABLET | Freq: Every day | ORAL | Status: DC
  Administered 2023-06-27 – 2023-06-28 (×2): 100 mg via ORAL
  Filled 2023-06-27 (×2): qty 1

## 2023-06-27 MED ORDER — IPRATROPIUM-ALBUTEROL 0.5-2.5 (3) MG/3ML IN SOLN: 3.0000 mL | Freq: Four times a day (QID) | RESPIRATORY_TRACT | Status: DC | PRN

## 2023-06-27 MED ORDER — LORAZEPAM 2 MG/ML IJ SOLN: 0.0000 mg | Freq: Two times a day (BID) | INTRAMUSCULAR | Status: DC

## 2023-06-27 MED ORDER — ACETAMINOPHEN 325 MG PO TABS: 650.0000 mg | ORAL_TABLET | Freq: Four times a day (QID) | ORAL | Status: DC | PRN

## 2023-06-27 MED ORDER — THIAMINE HCL 100 MG/ML IJ SOLN: 100.0000 mg | Freq: Every day | INTRAMUSCULAR | Status: DC

## 2023-06-27 MED ORDER — BENZONATATE 100 MG PO CAPS: 100.0000 mg | ORAL_CAPSULE | Freq: Three times a day (TID) | ORAL | Status: DC | PRN

## 2023-06-27 MED ORDER — PRAVASTATIN SODIUM 20 MG PO TABS
40.0000 mg | ORAL_TABLET | Freq: Every day | ORAL | Status: DC
  Administered 2023-06-27: 40 mg via ORAL
  Filled 2023-06-27: qty 2

## 2023-06-27 MED ORDER — ACETAMINOPHEN 325 MG PO TABS: 650.0000 mg | ORAL_TABLET | ORAL | Status: DC | PRN

## 2023-06-27 MED ORDER — POTASSIUM CHLORIDE CRYS ER 20 MEQ PO TBCR
20.0000 meq | EXTENDED_RELEASE_TABLET | Freq: Two times a day (BID) | ORAL | Status: DC
Start: 2023-06-27 — End: 2023-06-28
  Administered 2023-06-27 – 2023-06-28 (×2): 20 meq via ORAL
  Filled 2023-06-27 (×2): qty 1

## 2023-06-27 MED ORDER — ONDANSETRON HCL 4 MG/2ML IJ SOLN
4.0000 mg | Freq: Four times a day (QID) | INTRAMUSCULAR | Status: DC | PRN
Start: 2023-06-27 — End: 2023-06-28
  Administered 2023-06-27: 4 mg via INTRAVENOUS
  Filled 2023-06-27: qty 2

## 2023-06-27 MED ORDER — SERTRALINE HCL 50 MG PO TABS
50.0000 mg | ORAL_TABLET | Freq: Every day | ORAL | Status: DC
  Administered 2023-06-27 – 2023-06-28 (×2): 50 mg via ORAL
  Filled 2023-06-27 (×2): qty 1

## 2023-06-27 MED ORDER — MOMETASONE FURO-FORMOTEROL FUM 200-5 MCG/ACT IN AERO
2.0000 | INHALATION_SPRAY | Freq: Two times a day (BID) | RESPIRATORY_TRACT | Status: DC
  Administered 2023-06-27 – 2023-06-28 (×2): 2 via RESPIRATORY_TRACT
  Filled 2023-06-27 (×2): qty 8.8

## 2023-06-27 MED ORDER — HYDROXYZINE HCL 25 MG PO TABS: 25.0000 mg | ORAL_TABLET | Freq: Every day | ORAL | Status: DC | PRN

## 2023-06-27 MED ORDER — ASPIRIN 81 MG PO TBEC: 81.0000 mg | DELAYED_RELEASE_TABLET | Freq: Every day | ORAL | Status: DC

## 2023-06-27 MED ORDER — ENOXAPARIN SODIUM 40 MG/0.4ML IJ SOSY
40.0000 mg | PREFILLED_SYRINGE | INTRAMUSCULAR | Status: DC
  Administered 2023-06-27 – 2023-06-28 (×2): 40 mg via SUBCUTANEOUS
  Filled 2023-06-27 (×2): qty 0.4

## 2023-06-27 MED ORDER — SODIUM CHLORIDE 0.9 % IV BOLUS
1000.0000 mL | Freq: Once | INTRAVENOUS | Status: AC
  Administered 2023-06-27: 1000 mL via INTRAVENOUS

## 2023-06-27 MED ORDER — THIAMINE MONONITRATE 100 MG PO TABS: 100.0000 mg | ORAL_TABLET | Freq: Every day | ORAL | Status: DC

## 2023-06-27 MED ORDER — ASPIRIN 81 MG PO TBEC
81.0000 mg | DELAYED_RELEASE_TABLET | Freq: Every day | ORAL | Status: DC
  Administered 2023-06-27 – 2023-06-28 (×2): 81 mg via ORAL
  Filled 2023-06-27 (×2): qty 1

## 2023-06-27 NOTE — ED Notes (Signed)
 Echo tech to bedside. Echo tech stated a room was needed to perform Echo. RN and Consulting civil engineer attempted to provide room. No outlets in room, tech stated test could not be performed in room, stated that patient would have to wait until the patient obtained a room upstairs.

## 2023-06-27 NOTE — H&P (Signed)
 History and Physical    Darrell Santiago XBJ:[id number] DOB: 07-Oct-1965 DOA: 06/27/2023  PCP: Dana Allan, MD (Confirm with patient/family/NH records and if not entered, this has to be entered at [facility] point of entry) Patient coming from: Home  I have personally briefly reviewed patient's old medical records in [facility]  Chief Complaint: Chest pain  HPI: Darrell Santiago is a 58 y.o. male with medical history significant of alcohol abuse, COPD, HTN/chronic HFpEF, HLD, anxiety/depression, TIA presented with alcohol intoxication and chest pain.  Patient had 3 shots of vodka and went to sleep last night and woke up at 10 PM with severe retrosternal stabbing-like chest pain, associated with shortness of breath lasted for few hours worsening with activity.  Denied any lightheadedness sweating does feel nauseous but no vomiting. ED Course: Afebrile, borderline tachycardia blood pressure 130/70, O2 saturation 96% on room air.  Chest x-ray showed no acute infiltrates, blood work showed K2.5, hemoglobin 9.7 creatinine 0.5 BUN 13 bicarb 25, magnesium 1.5, troponin negative x 2.  EKG showed normal sinus rhythm no acute ST changes.  Patient was given IV and p.o. KCl and magnesium x 1 in the ED and started on CIWA protocol  Review of Systems: As per HPI otherwise 14 point review of systems negative.    Past Medical History:  Diagnosis Date   Abnormal EKG    Allergy    Seasonal   Anxiety    Chicken pox    COPD (chronic obstructive pulmonary disease) (HCC)    COVID-19    04/20/21   Depression    GERD (gastroesophageal reflux disease)    Hyperlipidemia    Hypertension    Urine incontinence     Past Surgical History:  Procedure Laterality Date   CHOLECYSTECTOMY  2000   LEFT HEART CATH AND CORONARY ANGIOGRAPHY N/A 01/31/2017   Procedure: LEFT HEART CATH AND CORONARY ANGIOGRAPHY;  Surgeon: Laurier Nancy, MD;  Location: [facility] INVASIVE CV LAB;  Service: Cardiovascular;  Laterality: N/A;      reports that he has been smoking cigarettes. He has a 26 pack-year smoking history. He has never used smokeless tobacco. He reports current alcohol use of about 21.0 standard drinks of alcohol per week. He reports that he does not use drugs.  No Known Allergies  Family History  Problem Relation Age of Onset   Alcohol abuse Father    Diabetes Father    Cancer Father        ?stage IV thyroid with met   Cancer Maternal Grandfather        Colon Cancer     Prior to Admission medications   Medication Sig Start Date End Date Taking? Authorizing Provider  acetaminophen (TYLENOL) 325 MG tablet Take 2 tablets (650 mg total) by mouth every 6 (six) hours as needed for mild pain (or Fever >/= 101). 07/26/18   Enid Baas, MD  amLODipine (NORVASC) 2.5 MG tablet Take 2.5 mg by mouth daily. 07/29/22   [provider]  aspirin EC 81 MG tablet Take 1 tablet (81 mg total) by mouth daily. Swallow whole. 07/04/22 07/04/23  Gillis Santa, MD  benzonatate (TESSALON) 100 MG capsule Take 1-2 capsules (100-200 mg total) by mouth 3 (three) times daily as needed. 05/12/23   Margaretann Loveless, PA-C  budesonide-formoterol (SYMBICORT) 160-4.5 MCG/ACT inhaler Inhale 2 puffs into the lungs 2 (two) times daily. Rinse mouth after use 08/09/22   Dana Allan, MD  cetirizine (ZYRTEC ALLERGY) 10 MG tablet Take 1 tablet (  10 mg total) by mouth daily. 11/27/22   Junie Spencer, FNP  fluticasone (FLONASE) 50 MCG/ACT nasal spray Place 2 sprays into both nostrils daily. 11/27/22   Junie Spencer, FNP  furosemide (LASIX) 20 MG tablet Take 1 tablet (20 mg total) by mouth daily. 05/02/23 05/01/24  Jene Every, MD  hydrOXYzine (ATARAX) 25 MG tablet Take 1 tablet (25 mg total) by mouth daily as needed. 08/09/22   Dana Allan, MD  ipratropium-albuterol (DUONEB) 0.5-2.5 (3) MG/3ML SOLN Inhale 3 mLs into the lungs every 6 (six) hours as needed. 08/09/22   Dana Allan, MD  oseltamivir (TAMIFLU) 75 MG capsule Take 1 capsule (75  mg total) by mouth 2 (two) times daily. 05/12/23   Margaretann Loveless, PA-C  potassium chloride SA (KLOR-CON M) 20 MEQ tablet Take 1 tablet (20 mEq total) by mouth 2 (two) times daily. 10/06/22   Dana Allan, MD  pravastatin (PRAVACHOL) 40 MG tablet Take 40 mg by mouth at bedtime. 07/29/22   [provider]  sertraline (ZOLOFT) 50 MG tablet Take 1 tablet (50 mg total) by mouth daily at 12 noon. 08/09/22   Dana Allan, MD  thiamine (VITAMIN B-1) 100 MG tablet Take 1 tablet (100 mg total) by mouth daily. 01/01/22   Darlin Priestly, MD  vitamin B-12 (CYANOCOBALAMIN) 1000 MCG tablet Take 1,000 mcg by mouth daily.     [provider]    Physical Exam: Vitals:   06/27/23 0533 06/27/23 0559 06/27/23 0613 06/27/23 0859  BP: 138/86 138/86 131/73   Pulse: 80 80 84 80  Resp: 16  15   Temp:   98.4 F (36.9 C)   TempSrc:   Oral   SpO2: 95%  96%   Weight:      Height:        Constitutional: NAD, calm, comfortable Vitals:   06/27/23 0533 06/27/23 0559 06/27/23 0613 06/27/23 0859  BP: 138/86 138/86 131/73   Pulse: 80 80 84 80  Resp: 16  15   Temp:   98.4 F (36.9 C)   TempSrc:   Oral   SpO2: 95%  96%   Weight:      Height:       Eyes: PERRL, lids and conjunctivae normal ENMT: Mucous membranes are moist. Posterior pharynx clear of any exudate or lesions.Normal dentition.  Neck: normal, supple, no masses, no thyromegaly Respiratory: clear to auscultation bilaterally, no wheezing, no crackles. Normal respiratory effort. No accessory muscle use.  Cardiovascular: Regular rate and rhythm, no murmurs / rubs / gallops. No extremity edema. 2+ pedal pulses. No carotid bruits.  Abdomen: no tenderness, no masses palpated. No hepatosplenomegaly. Bowel sounds positive.  Musculoskeletal: no clubbing / cyanosis. No joint deformity upper and lower extremities. Good ROM, no contractures. Normal muscle tone.  Skin: no rashes, lesions, ulcers. No induration Neurologic: CN 2-12 grossly intact.  Sensation intact, DTR normal. Strength 5/5 in all 4.  Tremors on bilateral fingers  Psychiatric: Normal judgment and insight. Alert and oriented x 3. Normal mood.     Labs on Admission: I have personally reviewed following labs and imaging studies  CBC: Recent Labs  Lab 06/27/23 0022  WBC 9.7  HGB 13.8  HCT 39.8  MCV 103.1*  PLT 173   Basic Metabolic Panel: Recent Labs  Lab 06/27/23 0022  NA 139  K 2.5*  CL 99  CO2 27  GLUCOSE 106*  BUN 13  CREATININE 0.53*  CALCIUM 10.0  MG 1.5*   GFR: Estimated Creatinine Clearance:  104.3 mL/min (A) (by C-G formula based on SCr of 0.53 mg/dL (L)). Liver Function Tests: No results for input(s): "AST", "ALT", "ALKPHOS", "BILITOT", "PROT", "ALBUMIN" in the last 168 hours. No results for input(s): "LIPASE", "AMYLASE" in the last 168 hours. No results for input(s): "AMMONIA" in the last 168 hours. Coagulation Profile: No results for input(s): "INR", "PROTIME" in the last 168 hours. Cardiac Enzymes: No results for input(s): "CKTOTAL", "CKMB", "CKMBINDEX", "TROPONINI" in the last 168 hours. BNP (last 3 results) No results for input(s): "PROBNP" in the last 8760 hours. HbA1C: No results for input(s): "HGBA1C" in the last 72 hours. CBG: No results for input(s): "GLUCAP" in the last 168 hours. Lipid Profile: No results for input(s): "CHOL", "HDL", "LDLCALC", "TRIG", "CHOLHDL", "LDLDIRECT" in the last 72 hours. Thyroid Function Tests: No results for input(s): "TSH", "T4TOTAL", "FREET4", "T3FREE", "THYROIDAB" in the last 72 hours. Anemia Panel: No results for input(s): "VITAMINB12", "FOLATE", "FERRITIN", "TIBC", "IRON", "RETICCTPCT" in the last 72 hours. Urine analysis:    Component Value Date/Time   COLORURINE DARK YELLOW 12/30/2021 0735   APPEARANCEUR CLEAR 12/30/2021 0735   LABSPEC 1.012 12/30/2021 0735   PHURINE 6.5 12/30/2021 0735   GLUCOSEU NEGATIVE 12/30/2021 0735   GLUCOSEU NEGATIVE 06/05/2017 0852   HGBUR NEGATIVE  12/30/2021 0735   BILIRUBINUR NEGATIVE 07/23/2018 1826   KETONESUR NEGATIVE 12/30/2021 0735   PROTEINUR TRACE (A) 12/30/2021 0735   UROBILINOGEN 1.0 06/05/2017 0852   NITRITE NEGATIVE 12/30/2021 0735   LEUKOCYTESUR TRACE (A) 12/30/2021 0735    Radiological Exams on Admission: DG Chest 2 View Result Date: 06/27/2023 CLINICAL DATA:  Chest pain EXAM: CHEST - 2 VIEW COMPARISON:  None Available. FINDINGS: The heart size and mediastinal contours are within normal limits. Both lungs are clear. The visualized skeletal structures are unremarkable. IMPRESSION: No active cardiopulmonary disease. Electronically Signed   By: Helyn Numbers M.D.   On: 06/27/2023 02:53    EKG: Independently reviewed.  Sinus rhythm, chronic RBBB, chronic nonspecific ST changes on multiple leads  Assessment/Plan Active Problems:   Alcohol dependence (HCC)   Chest pain  (please populate well all problems here in Problem List. (For example, if patient is on BP meds at home and you resume or decide to hold them, it is a problem that needs to be her. Same for CAD, COPD, HLD and so on)  Atypical chest pain -Rule out ACS.  Troponin negative x 2, EKG showed no acute ST changes -Echocardiogram, if no significant focal wall motion abnormalities, likely patient can be discharged home to follow-up with cardiology for outpatient stress test. -Start aspirin  Severe hypokalemia -IV and p.o. replacement, recheck level this morning -Check phosphorus level  Hypomagnesemia -IV replacement  Alcohol withdrawal -CIWA protocol  HTN/chronic HFpEF -Mild dehydration and blood pressure borderline low, on IV fluid  -hold off amlodipine and Lasix  COPD -Stable, continue as needed breathing meds  Anxiety/depression -Mentation at baseline, continue SSRI   DVT prophylaxis: Lovenox Code Status: Full code Family Communication: None at bedside Disposition Plan: Expect less than 2 midnight hospital stay Consults called:  None Admission status: Telemetry observation   Emeline General MD [facility] Pager [phone]  06/27/2023, 9:52 AM

## 2023-06-27 NOTE — ED Triage Notes (Addendum)
 Pt to ED via EMS from home, pt reports centralized chest pain that began earlier today. Pt states he has hx MI that was 10 years ago and he has not followed up with cardiologist in 8 years. Pt states pain is worse with movement and touch. Pt was given 324 asa by EMS and states he has had "a few beers tonight"

## 2023-06-27 NOTE — Progress Notes (Signed)
*  PRELIMINARY RESULTS* Echocardiogram 2D Echocardiogram has been performed.  Carolyne Fiscal 06/27/2023, 4:00 PM

## 2023-06-27 NOTE — ED Notes (Signed)
 CCMD aware to be monitoring patient

## 2023-06-27 NOTE — ED Provider Notes (Signed)
 [facility] Provider Note    Event Date/Time   First MD Initiated Contact with Patient 06/27/23 0530     (approximate)   History   Chest Pain   HPI  Darrell Santiago is a 58 y.o. male with history of COPD, hypertension, hyperlipidemia, alcohol use disorder who presents to the emergency department with sudden onset sharp chest pain feeling like someone was stabbing him with shortness of breath that started earlier today and has now resolved.  No aggravating relieving factors.  No fevers, cough, diaphoresis, dizziness.  Does report he had 2 episodes of vomiting 2 days ago.  No diarrhea.  No history of PE, DVT, exogenous estrogen use, recent fractures, surgery, trauma, hospitalization, prolonged travel or other immobilization. No lower extremity swelling or pain. No calf tenderness.  History provided by patient, wife.    Past Medical History:  Diagnosis Date   Abnormal EKG    Allergy    Seasonal   Anxiety    Chicken pox    COPD (chronic obstructive pulmonary disease) (HCC)    COVID-19    04/20/21   Depression    GERD (gastroesophageal reflux disease)    Hyperlipidemia    Hypertension    Urine incontinence     Past Surgical History:  Procedure Laterality Date   CHOLECYSTECTOMY  2000   LEFT HEART CATH AND CORONARY ANGIOGRAPHY N/A 01/31/2017   Procedure: LEFT HEART CATH AND CORONARY ANGIOGRAPHY;  Surgeon: Laurier Nancy, MD;  Location: [facility] INVASIVE CV LAB;  Service: Cardiovascular;  Laterality: N/A;    MEDICATIONS:  Prior to Admission medications   Medication Sig Start Date End Date Taking? Authorizing Provider  acetaminophen (TYLENOL) 325 MG tablet Take 2 tablets (650 mg total) by mouth every 6 (six) hours as needed for mild pain (or Fever >/= 101). 07/26/18   Enid Baas, MD  amLODipine (NORVASC) 2.5 MG tablet Take 2.5 mg by mouth daily. 07/29/22   [provider]  aspirin EC 81 MG tablet Take 1 tablet (81 mg total) by mouth daily.  Swallow whole. 07/04/22 07/04/23  Gillis Santa, MD  benzonatate (TESSALON) 100 MG capsule Take 1-2 capsules (100-200 mg total) by mouth 3 (three) times daily as needed. 05/12/23   Margaretann Loveless, PA-C  budesonide-formoterol (SYMBICORT) 160-4.5 MCG/ACT inhaler Inhale 2 puffs into the lungs 2 (two) times daily. Rinse mouth after use 08/09/22   Dana Allan, MD  cetirizine (ZYRTEC ALLERGY) 10 MG tablet Take 1 tablet (10 mg total) by mouth daily. 11/27/22   Junie Spencer, FNP  fluticasone (FLONASE) 50 MCG/ACT nasal spray Place 2 sprays into both nostrils daily. 11/27/22   Junie Spencer, FNP  furosemide (LASIX) 20 MG tablet Take 1 tablet (20 mg total) by mouth daily. 05/02/23 05/01/24  Jene Every, MD  hydrOXYzine (ATARAX) 25 MG tablet Take 1 tablet (25 mg total) by mouth daily as needed. 08/09/22   Dana Allan, MD  ipratropium-albuterol (DUONEB) 0.5-2.5 (3) MG/3ML SOLN Inhale 3 mLs into the lungs every 6 (six) hours as needed. 08/09/22   Dana Allan, MD  oseltamivir (TAMIFLU) 75 MG capsule Take 1 capsule (75 mg total) by mouth 2 (two) times daily. 05/12/23   Margaretann Loveless, PA-C  potassium chloride SA (KLOR-CON M) 20 MEQ tablet Take 1 tablet (20 mEq total) by mouth 2 (two) times daily. 10/06/22   Dana Allan, MD  pravastatin (PRAVACHOL) 40 MG tablet Take 40 mg by mouth at bedtime. 07/29/22   [provider]  sertraline (ZOLOFT) 50 MG tablet Take 1 tablet (50 mg total) by mouth daily at 12 noon. 08/09/22   Dana Allan, MD  thiamine (VITAMIN B-1) 100 MG tablet Take 1 tablet (100 mg total) by mouth daily. 01/01/22   Darlin Priestly, MD  vitamin B-12 (CYANOCOBALAMIN) 1000 MCG tablet Take 1,000 mcg by mouth daily.     [provider]    Physical Exam   Triage Vital Signs: ED Triage Vitals  Encounter Vitals Group     BP 06/27/23 0026 117/79     Systolic BP Percentile --      Diastolic BP Percentile --      Pulse Rate 06/27/23 0026 92     Resp 06/27/23 0026 20     Temp 06/27/23  0026 98.5 F (36.9 C)     Temp Source 06/27/23 0026 Oral     SpO2 06/27/23 0026 99 %     Weight 06/27/23 0021 175 lb (79.4 kg)     Height 06/27/23 0021 5\' 9"  (1.753 m)     Head Circumference --      Peak Flow --      Pain Score 06/27/23 0020 6     Pain Loc --      Pain Education --      Exclude from Growth Chart --     Most recent vital signs: Vitals:   06/27/23 0226 06/27/23 0533  BP: 131/87 138/86  Pulse: 90 80  Resp: 18 16  Temp: 98.4 F (36.9 C)   SpO2: 96% 95%    CONSTITUTIONAL: Alert, responds appropriately to questions. Well-appearing; well-nourished HEAD: Normocephalic, atraumatic EYES: Conjunctivae clear, pupils appear equal, sclera nonicteric ENT: normal nose; moist mucous membranes NECK: Supple, normal ROM CARD: RRR; S1 and S2 appreciated RESP: Normal chest excursion without splinting or tachypnea; breath sounds clear and equal bilaterally; no wheezes, no rhonchi, no rales, no hypoxia or respiratory distress, speaking full sentences ABD/GI: Non-distended; soft, non-tender, no rebound, no guarding, no peritoneal signs BACK: The back appears normal EXT: Normal ROM in all joints; no deformity noted, no edema, no calf tenderness or calf swelling SKIN: Normal color for age and race; warm; no rash on exposed skin NEURO: Moves all extremities equally, normal speech PSYCH: The patient's mood and manner are appropriate.   ED Results / Procedures / Treatments   LABS: (all labs ordered are listed, but only abnormal results are displayed) Labs Reviewed  BASIC METABOLIC PANEL - Abnormal; Notable for the following components:      Result Value   Potassium 2.5 (*)    Glucose, Bld 106 (*)    Creatinine, Ser 0.53 (*)    All other components within normal limits  CBC - Abnormal; Notable for the following components:   RBC 3.86 (*)    MCV 103.1 (*)    MCH 35.8 (*)    All other components within normal limits  ETHANOL - Abnormal; Notable for the following components:    Alcohol, Ethyl (B) 67 (*)    All other components within normal limits  MAGNESIUM - Abnormal; Notable for the following components:   Magnesium 1.5 (*)    All other components within normal limits  TROPONIN I (HIGH SENSITIVITY)  TROPONIN I (HIGH SENSITIVITY)     EKG:  EKG Interpretation Date/Time:  Tuesday June 27 2023 00:21:08 EDT Ventricular Rate:  89 PR Interval:  158 QRS Duration:  134 QT Interval:  442 QTC Calculation: 537 R Axis:   -63  Text Interpretation: Normal  sinus rhythm Right bundle branch block Left anterior fascicular block Bifascicular block Cannot rule out Inferior infarct , age undetermined Abnormal ECG When compared with ECG of 26-Oct-2022 12:38, PREVIOUS ECG IS PRESENT No significant change since last tracing Confirmed by Rochele Raring [phone]) on 06/27/2023 5:40:46 AM         RADIOLOGY: My personal review and interpretation of imaging: Chest x-ray clear.  I have personally reviewed all radiology reports.   DG Chest 2 View Result Date: 06/27/2023 CLINICAL DATA:  Chest pain EXAM: CHEST - 2 VIEW COMPARISON:  None Available. FINDINGS: The heart size and mediastinal contours are within normal limits. Both lungs are clear. The visualized skeletal structures are unremarkable. IMPRESSION: No active cardiopulmonary disease. Electronically Signed   By: Helyn Numbers M.D.   On: 06/27/2023 02:53     PROCEDURES:  Critical Care performed: Yes, see critical care procedure note(s)   CRITICAL CARE Performed by: Baxter Hire Yetta Marceaux   Total critical care time: 30 minutes  Critical care time was exclusive of separately billable procedures and treating other patients.  Critical care was necessary to treat or prevent imminent or life-threatening deterioration.  Critical care was time spent personally by me on the following activities: development of treatment plan with patient and/or surrogate as well as nursing, discussions with consultants, evaluation of patient's  response to treatment, examination of patient, obtaining history from patient or surrogate, ordering and performing treatments and interventions, ordering and review of laboratory studies, ordering and review of radiographic studies, pulse oximetry and re-evaluation of patient's condition.   Marland Kitchen1-3 Lead EKG Interpretation  Performed by: Marielis Samara, Layla Maw, DO Authorized by: Elizeth Weinrich, Layla Maw, DO     Interpretation: normal     ECG rate:  80   ECG rate assessment: normal     Rhythm: sinus rhythm     Ectopy: none     Conduction: normal       IMPRESSION / MDM / ASSESSMENT AND PLAN / ED COURSE  I reviewed the triage vital signs and the nursing notes.    Patient here for atypical chest pain.  The patient is on the cardiac monitor to evaluate for evidence of arrhythmia and/or significant heart rate changes.   DIFFERENTIAL DIAGNOSIS (includes but not limited to):   ACS, PE, pneumonia, CHF, dissection, esophageal spasm, esophagitis, indigestion, chest wall pain   Patient's presentation is most consistent with acute presentation with potential threat to life or bodily function.   PLAN: Patient's EKG shows a bifascicular block which is chronic, unchanged.  Troponin x 2 obtained from triage negative.  Chest x-ray reviewed and interpreted by myself and the radiologist and is clear.  No risk factors for PE other than age.  Doubt dissection.  Chest pain, shortness of breath have resolved.  Patient does have a magnesium of 1.5, potassium of 2.5 without QT or QRS prolongation on EKG.  Likely secondary to alcohol abuse.  Did have 2 episodes of vomiting 2 days ago but no diarrhea, not on diuretics.  Will give IV and oral replacement.  Recommended admission for observation.  Will discuss with hospitalist.   MEDICATIONS GIVEN IN ED: Medications  potassium chloride 10 mEq in 100 mL IVPB (10 mEq Intravenous New Bag/Given 06/27/23 0554)  magnesium sulfate IVPB 2 g 50 mL (2 g Intravenous New Bag/Given  06/27/23 0554)  0.9 %  sodium chloride infusion (has no administration in time range)  LORazepam (ATIVAN) injection 0-4 mg (has no administration in time range)    Or  LORazepam (ATIVAN) tablet 0-4 mg (has no administration in time range)  LORazepam (ATIVAN) injection 0-4 mg (has no administration in time range)    Or  LORazepam (ATIVAN) tablet 0-4 mg (has no administration in time range)  thiamine (VITAMIN B1) tablet 100 mg (has no administration in time range)    Or  thiamine (VITAMIN B1) injection 100 mg (has no administration in time range)  potassium chloride SA (KLOR-CON M) CR tablet 40 mEq (40 mEq Oral Given 06/27/23 0229)  sodium chloride 0.9 % bolus 1,000 mL (1,000 mLs Intravenous New Bag/Given 06/27/23 0552)     ED COURSE:  Consulted and discussed patient's case with hospitalist, Dr. Arville Care.  I have recommended admission and consulting physician agrees and will place admission orders.  Patient (and family if present) agree with this plan.   I reviewed all nursing notes, vitals, pertinent previous records.  All labs, EKGs, imaging ordered have been independently reviewed and interpreted by myself.   OUTSIDE RECORDS REVIEWED: Reviewed last admission for weakness with hypokalemia and hypomagnesemia in July 2024.       FINAL CLINICAL IMPRESSION(S) / ED DIAGNOSES   Final diagnoses:  Atypical chest pain  Hypokalemia  Hypomagnesemia  Alcohol use disorder     Rx / DC Orders   ED Discharge Orders     None        Note:  This document was prepared using Dragon voice recognition software and may include unintentional dictation errors.   Blaire Hodsdon, Layla Maw, DO 06/27/23 [phone]

## 2023-06-28 DIAGNOSIS — R0789 Other chest pain: Secondary | ICD-10-CM | POA: Diagnosis not present

## 2023-06-28 LAB — ECHOCARDIOGRAM COMPLETE
AR max vel: 3 cm2
AV Area VTI: 2.83 cm2
AV Area mean vel: 2.74 cm2
AV Mean grad: 3 mmHg
AV Peak grad: 6.7 mmHg
Ao pk vel: 1.29 m/s
Area-P 1/2: 3.36 cm2
Calc EF: 58.6 %
Height: 69 in
MV VTI: 2.83 cm2
S' Lateral: 3.3 cm
Single Plane A2C EF: 55.7 %
Single Plane A4C EF: 58.7 %
Weight: 2800 [oz_av]

## 2023-06-28 LAB — BASIC METABOLIC PANEL
Anion gap: 7 (ref 5–15)
BUN: 12 mg/dL (ref 6–20)
CO2: 24 mmol/L (ref 22–32)
Calcium: 8.6 mg/dL — ABNORMAL LOW (ref 8.9–10.3)
Chloride: 104 mmol/L (ref 98–111)
Creatinine, Ser: 0.48 mg/dL — ABNORMAL LOW (ref 0.61–1.24)
GFR, Estimated: >60 mL/min (ref >60)
Glucose, Bld: 97 mg/dL (ref 70–99)
Potassium: 3.6 mmol/L (ref 3.5–5.1)
Sodium: 135 mmol/L (ref 135–145)

## 2023-06-28 LAB — PHOSPHORUS: Phosphorus: 2.3 mg/dL — ABNORMAL LOW (ref 2.5–4.6)

## 2023-06-28 LAB — MAGNESIUM: Magnesium: 1.7 mg/dL (ref 1.7–2.4)

## 2023-06-28 NOTE — Discharge Summary (Signed)
 Physician Discharge Summary   Patient: Darrell Santiago MRN: [id number] DOB: 06-Mar-1966  Admit date:     06/27/2023  Discharge date: 06/28/23  Discharge Physician: Lurene Shadow   PCP: Dana Allan, MD   Recommendations at discharge:   Follow-up with PCP in 1 to 2 weeks  Discharge Diagnoses: Active Problems:   Alcohol dependence (HCC)   Atypical chest pain  Resolved Problems:   * No resolved hospital problems. [facility] Course:  Mr. Jasen Hartstein is a 58 year old man with medical history significant for alcohol use disorder, COPD, hypertension, chronic HFpEF, hyperlipidemia, anxiety, depression, TIA, who presented to the hospital with chest pain and alcohol intoxication.  She drank 3 shots of vodka and went to sleep that night prior to admission.  She woke up around 10 PM with severe retrosternal stabbing chest pain respiratory shortness of breath.  Troponins were negative x 2.  Potassium was 2.5 and magnesium was 1.5.  Alcohol level was 67.  He was admitted to the hospital for acute alcohol intoxication, chest pain likely from alcohol intoxication, hypokalemia and hypomagnesemia. He was treated with IV fluids.  Potassium and magnesium were repleted. 2D echo showed EF estimated at 60 to 65%, normal LV diastolic parameters.  No regional wall motion abnormalities in the left ventricle.  His condition has improved.  He feels better and wants to go home today.  He is deemed stable for discharge to home.        Consultants: None Procedures performed: None  Disposition: Home Diet recommendation:  Discharge Diet Orders (From admission, onward)     Start     Ordered   06/28/23 0000  Diet - low sodium heart healthy        06/28/23 1409           Cardiac diet DISCHARGE MEDICATION: Allergies as of 06/28/2023   No Known Allergies      Medication List     STOP taking these medications    amLODipine 2.5 MG tablet Commonly known as: NORVASC   cyanocobalamin 1000 MCG  tablet Commonly known as: VITAMIN B12   pravastatin 40 MG tablet Commonly known as: PRAVACHOL   sertraline 50 MG tablet Commonly known as: ZOLOFT   thiamine 100 MG tablet Commonly known as: Vitamin B-1       TAKE these medications    acetaminophen 325 MG tablet Commonly known as: TYLENOL Take 2 tablets (650 mg total) by mouth every 6 (six) hours as needed for mild pain (or Fever >/= 101).   aspirin EC 81 MG tablet Take 1 tablet (81 mg total) by mouth daily. Swallow whole.   benzonatate 100 MG capsule Commonly known as: TESSALON Take 1-2 capsules (100-200 mg total) by mouth 3 (three) times daily as needed.   budesonide-formoterol 160-4.5 MCG/ACT inhaler Commonly known as: Symbicort Inhale 2 puffs into the lungs 2 (two) times daily. Rinse mouth after use   cetirizine 10 MG tablet Commonly known as: ZyrTEC Allergy Take 1 tablet (10 mg total) by mouth daily.   fluticasone 50 MCG/ACT nasal spray Commonly known as: FLONASE Place 2 sprays into both nostrils daily.   furosemide 20 MG tablet Commonly known as: Lasix Take 1 tablet (20 mg total) by mouth daily.   hydrOXYzine 25 MG tablet Commonly known as: ATARAX Take 1 tablet (25 mg total) by mouth daily as needed.   ipratropium-albuterol 0.5-2.5 (3) MG/3ML Soln Commonly known as: DUONEB Inhale 3 mLs into the lungs every 6 (six) hours as needed.  potassium chloride SA 20 MEQ tablet Commonly known as: KLOR-CON M Take 1 tablet (20 mEq total) by mouth 2 (two) times daily.        Discharge Exam: Filed Weights   06/27/23 0021  Weight: 79.4 kg   GEN: NAD SKIN: Warm and dry EYES: No pallor or icterus ENT: MMM CV: RRR PULM: CTA B ABD: soft, ND, NT, +BS CNS: AAO x 3, non focal EXT: No edema or tenderness   Condition at discharge: good  The results of significant diagnostics from this hospitalization (including imaging, microbiology, ancillary and laboratory) are listed below for reference.   Imaging  Studies: ECHOCARDIOGRAM COMPLETE Result Date: 06/28/2023    ECHOCARDIOGRAM REPORT   Patient Name:   Darrell Santiago Date of Exam: 06/27/2023 Medical Rec #:  [id number]      Height:       69.0 in Accession #:    [id number]     Weight:       175.0 lb Date of Birth:  19-Nov-1965      BSA:          1.952 m Patient Age:    57 years       BP:           130/77 mmHg Patient Gender: M              HR:           89 bpm. Exam Location:  [facility] Procedure: 2D Echo, Cardiac Doppler, Color Doppler and Strain Analysis (Both            Spectral and Color Flow Doppler were utilized during procedure). Indications:     Chest Pain  History:         Patient has prior history of Echocardiogram examinations, most                  recent 10/26/2022. CAD, Abnormal ECG, TIA and COPD,                  Signs/Symptoms:Chest Pain; Risk Factors:Hypertension,                  Dyslipidemia and Current Smoker. ETOH abuse.  Sonographer:     Mikki Harbor Referring Phys:  [id number] Emeline General Diagnosing Phys: Clotilde Dieter  Sonographer Comments: Image acquisition challenging due to COPD. Global longitudinal strain was attempted. IMPRESSIONS  1. Left ventricular ejection fraction, by estimation, is 60 to 65%. The left ventricle has normal function. The left ventricle has no regional wall motion abnormalities. Left ventricular diastolic parameters were normal.  2. Right ventricular systolic function is normal. The right ventricular size is normal.  3. The mitral valve is normal in structure. No evidence of mitral valve regurgitation. No evidence of mitral stenosis.  4. The aortic valve is normal in structure. Aortic valve regurgitation is not visualized. No aortic stenosis is present.  5. The inferior vena cava is normal in size with greater than 50% respiratory variability, suggesting right atrial pressure of 3 mmHg. FINDINGS  Left Ventricle: Left ventricular ejection fraction, by estimation, is 60 to 65%. The left ventricle has normal function. The  left ventricle has no regional wall motion abnormalities. The left ventricular internal cavity size was normal in size. There is  no left ventricular hypertrophy. Left ventricular diastolic parameters were normal. Right Ventricle: The right ventricular size is normal. No increase in right ventricular wall thickness. Right ventricular systolic function is normal. Left Atrium: Left atrial size was  normal in size. Right Atrium: Right atrial size was normal in size. Pericardium: There is no evidence of pericardial effusion. Mitral Valve: The mitral valve is normal in structure. No evidence of mitral valve regurgitation. No evidence of mitral valve stenosis. MV peak gradient, 4.1 mmHg. The mean mitral valve gradient is 2.0 mmHg. Tricuspid Valve: The tricuspid valve is normal in structure. Tricuspid valve regurgitation is trivial. Aortic Valve: The aortic valve is normal in structure. Aortic valve regurgitation is not visualized. No aortic stenosis is present. Aortic valve mean gradient measures 3.0 mmHg. Aortic valve peak gradient measures 6.7 mmHg. Aortic valve area, by VTI measures 2.83 cm. Pulmonic Valve: The pulmonic valve was normal in structure. Pulmonic valve regurgitation is not visualized. Aorta: The aortic root is normal in size and structure. Venous: The inferior vena cava is normal in size with greater than 50% respiratory variability, suggesting right atrial pressure of 3 mmHg. IAS/Shunts: No atrial level shunt detected by color flow Doppler.  LEFT VENTRICLE PLAX 2D LVIDd:         4.50 cm     Diastology LVIDs:         3.30 cm     LV e' medial:    8.16 cm/s LV PW:         1.20 cm     LV E/e' medial:  8.1 LV IVS:        1.00 cm     LV e' lateral:   10.20 cm/s LVOT diam:     2.00 cm     LV E/e' lateral: 6.5 LV SV:         86 LV SV Index:   44 LVOT Area:     3.14 cm  LV Volumes (MOD) LV vol d, MOD A2C: 71.8 ml LV vol d, MOD A4C: 68.3 ml LV vol s, MOD A2C: 31.8 ml LV vol s, MOD A4C: 28.2 ml LV SV MOD A2C:      40.0 ml LV SV MOD A4C:     68.3 ml LV SV MOD BP:      42.4 ml RIGHT VENTRICLE RV Basal diam:  3.90 cm RV Mid diam:    3.70 cm RV S prime:     11.70 cm/s TAPSE (M-mode): 2.1 cm LEFT ATRIUM             Index        RIGHT ATRIUM           Index LA diam:        4.30 cm 2.20 cm/m   RA Area:     19.30 cm LA Vol (A2C):   56.5 ml 28.94 ml/m  RA Volume:   63.10 ml  32.33 ml/m LA Vol (A4C):   52.4 ml 26.84 ml/m LA Biplane Vol: 55.2 ml 28.28 ml/m  AORTIC VALVE                    PULMONIC VALVE AV Area (Vmax):    3.00 cm     PV Vmax:       0.98 m/s AV Area (Vmean):   2.74 cm     PV Peak grad:  3.8 mmHg AV Area (VTI):     2.83 cm AV Vmax:           129.00 cm/s AV Vmean:          84.300 cm/s AV VTI:            0.304 m AV Peak Grad:  6.7 mmHg AV Mean Grad:      3.0 mmHg LVOT Vmax:         123.00 cm/s LVOT Vmean:        73.600 cm/s LVOT VTI:          0.274 m LVOT/AV VTI ratio: 0.90  AORTA Ao Root diam: 3.20 cm MITRAL VALVE MV Area (PHT): 3.36 cm    SHUNTS MV Area VTI:   2.83 cm    Systemic VTI:  0.27 m MV Peak grad:  4.1 mmHg    Systemic Diam: 2.00 cm MV Mean grad:  2.0 mmHg MV Vmax:       1.01 m/s MV Vmean:      62.8 cm/s MV Decel Time: 226 msec MV E velocity: 66.00 cm/s MV A velocity: 89.70 cm/s MV E/A ratio:  0.74 Sabina Custovic Electronically signed by Clotilde Dieter Signature Date/Time: 06/28/2023/1:32:27 PM    Final    DG Chest 2 View Result Date: 06/27/2023 CLINICAL DATA:  Chest pain EXAM: CHEST - 2 VIEW COMPARISON:  None Available. FINDINGS: The heart size and mediastinal contours are within normal limits. Both lungs are clear. The visualized skeletal structures are unremarkable. IMPRESSION: No active cardiopulmonary disease. Electronically Signed   By: Helyn Numbers M.D.   On: 06/27/2023 02:53    Microbiology: Results for orders placed or performed during the hospital encounter of 10/26/22  Blood culture (single)     Status: None   Collection Time: 10/26/22  1:55 PM   Specimen: BLOOD  Result  Value Ref Range Status   Specimen Description BLOOD BLOOD RIGHT ARM  Final   Special Requests   Final    BOTTLES DRAWN AEROBIC AND ANAEROBIC Blood Culture adequate volume   Culture   Final    NO GROWTH 5 DAYS Performed at [facility], [address] [place], [place] [address]    Report Status 10/31/2022 FINAL  Final    Labs: CBC: Recent Labs  Lab 06/27/23 0022  WBC 9.7  HGB 13.8  HCT 39.8  MCV 103.1*  PLT 173   Basic Metabolic Panel: Recent Labs  Lab 06/27/23 0022 06/27/23 1044 06/28/23 0457 06/28/23 0838  NA 139  --  135  --   K 2.5* 3.6 3.6  --   CL 99  --  104  --   CO2 27  --  24  --   GLUCOSE 106*  --  97  --   BUN 13  --  12  --   CREATININE 0.53*  --  0.48*  --   CALCIUM 10.0  --  8.6*  --   MG 1.5*  --   --  1.7  PHOS  --  2.1*  --  2.3*   Liver Function Tests: No results for input(s): "AST", "ALT", "ALKPHOS", "BILITOT", "PROT", "ALBUMIN" in the last 168 hours. CBG: No results for input(s): "GLUCAP" in the last 168 hours.  Discharge time spent: greater than 30 minutes.  Signed: Lurene Shadow, MD [facility] 06/28/2023

## 2023-09-03 IMAGING — CR DG CHEST 2V
2 series · 2 of 2 positions shown · non-contrast
Comparison: 07/25/2018

CLINICAL DATA: Shortness of breath

EXAM:
CHEST - 2 VIEW

[chest pa]
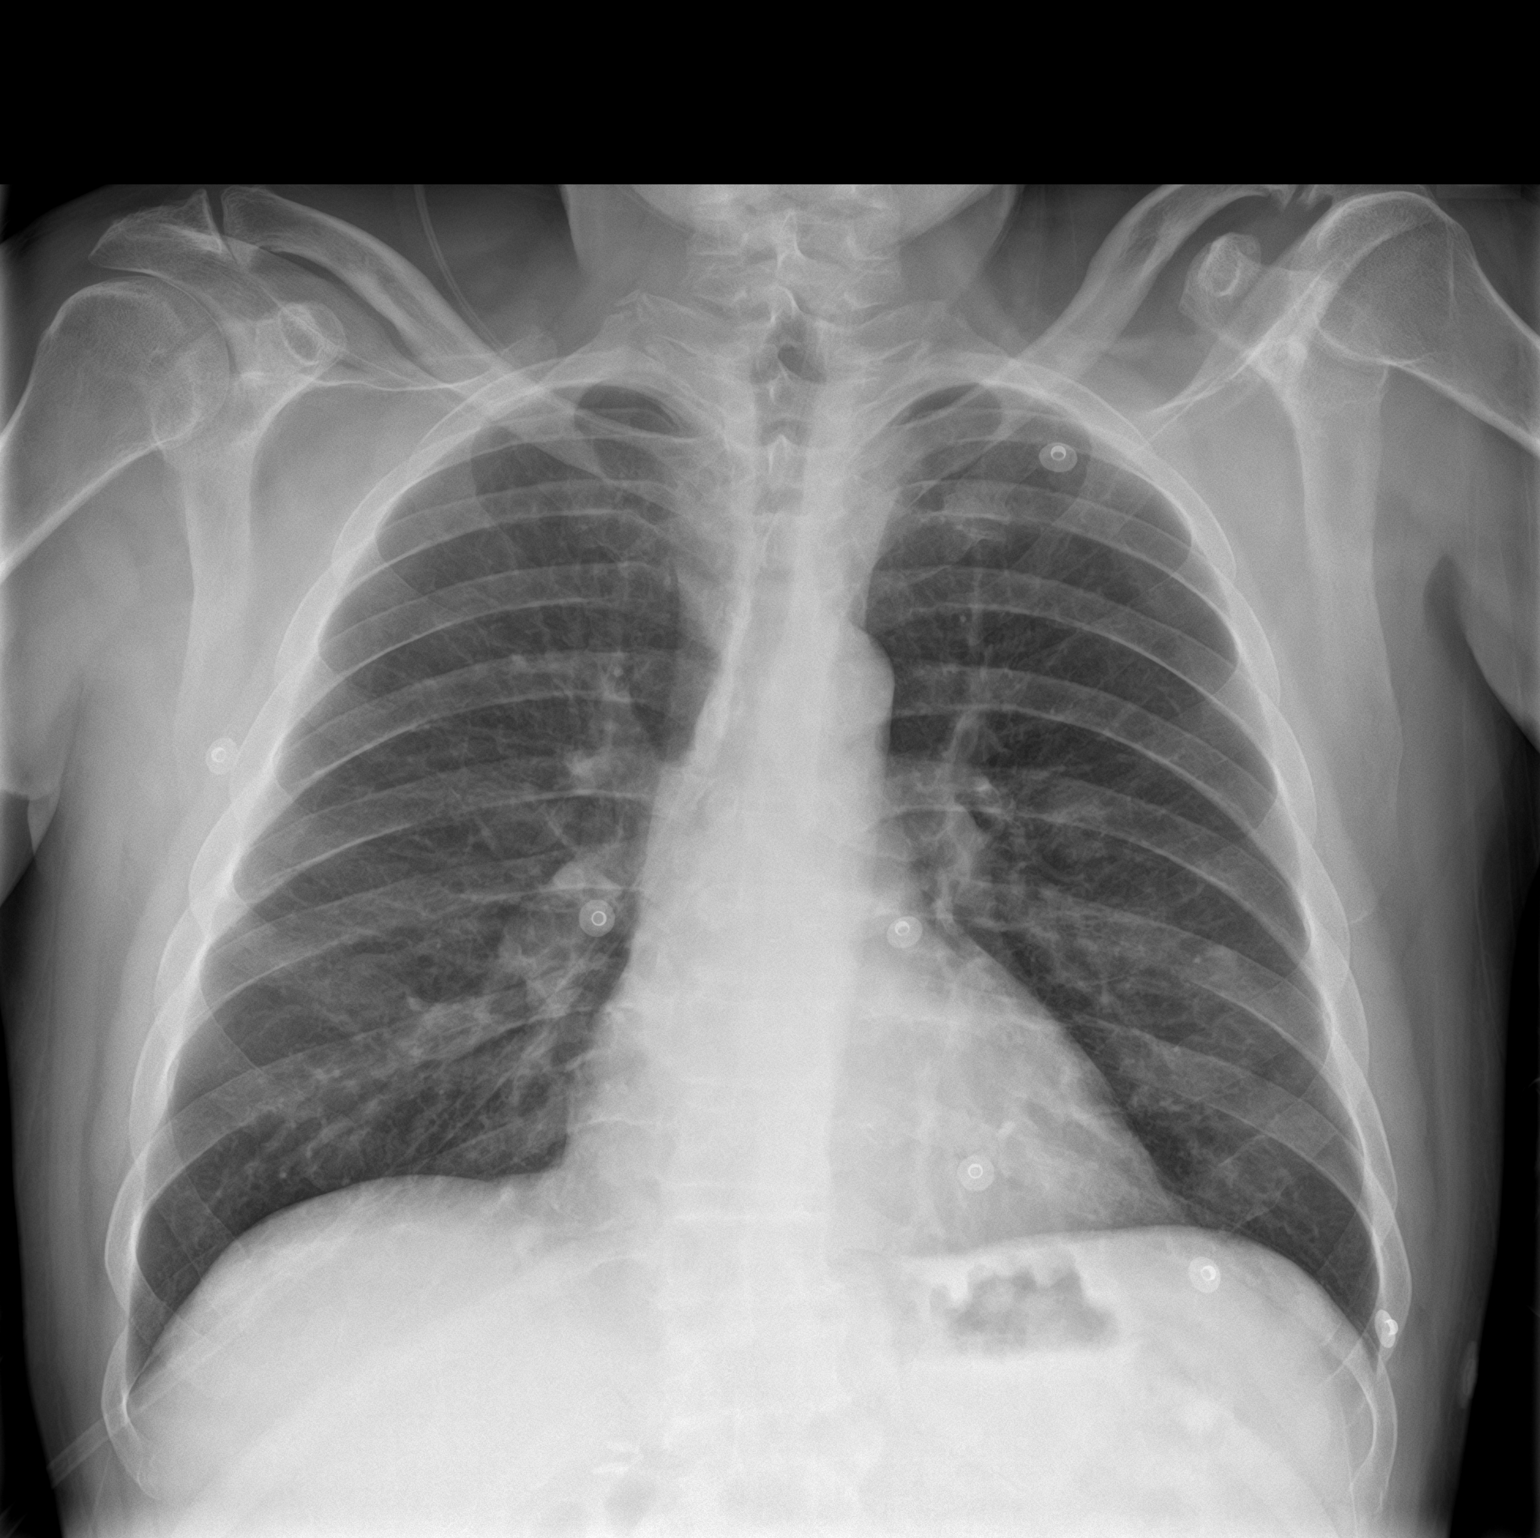

[chest lat]
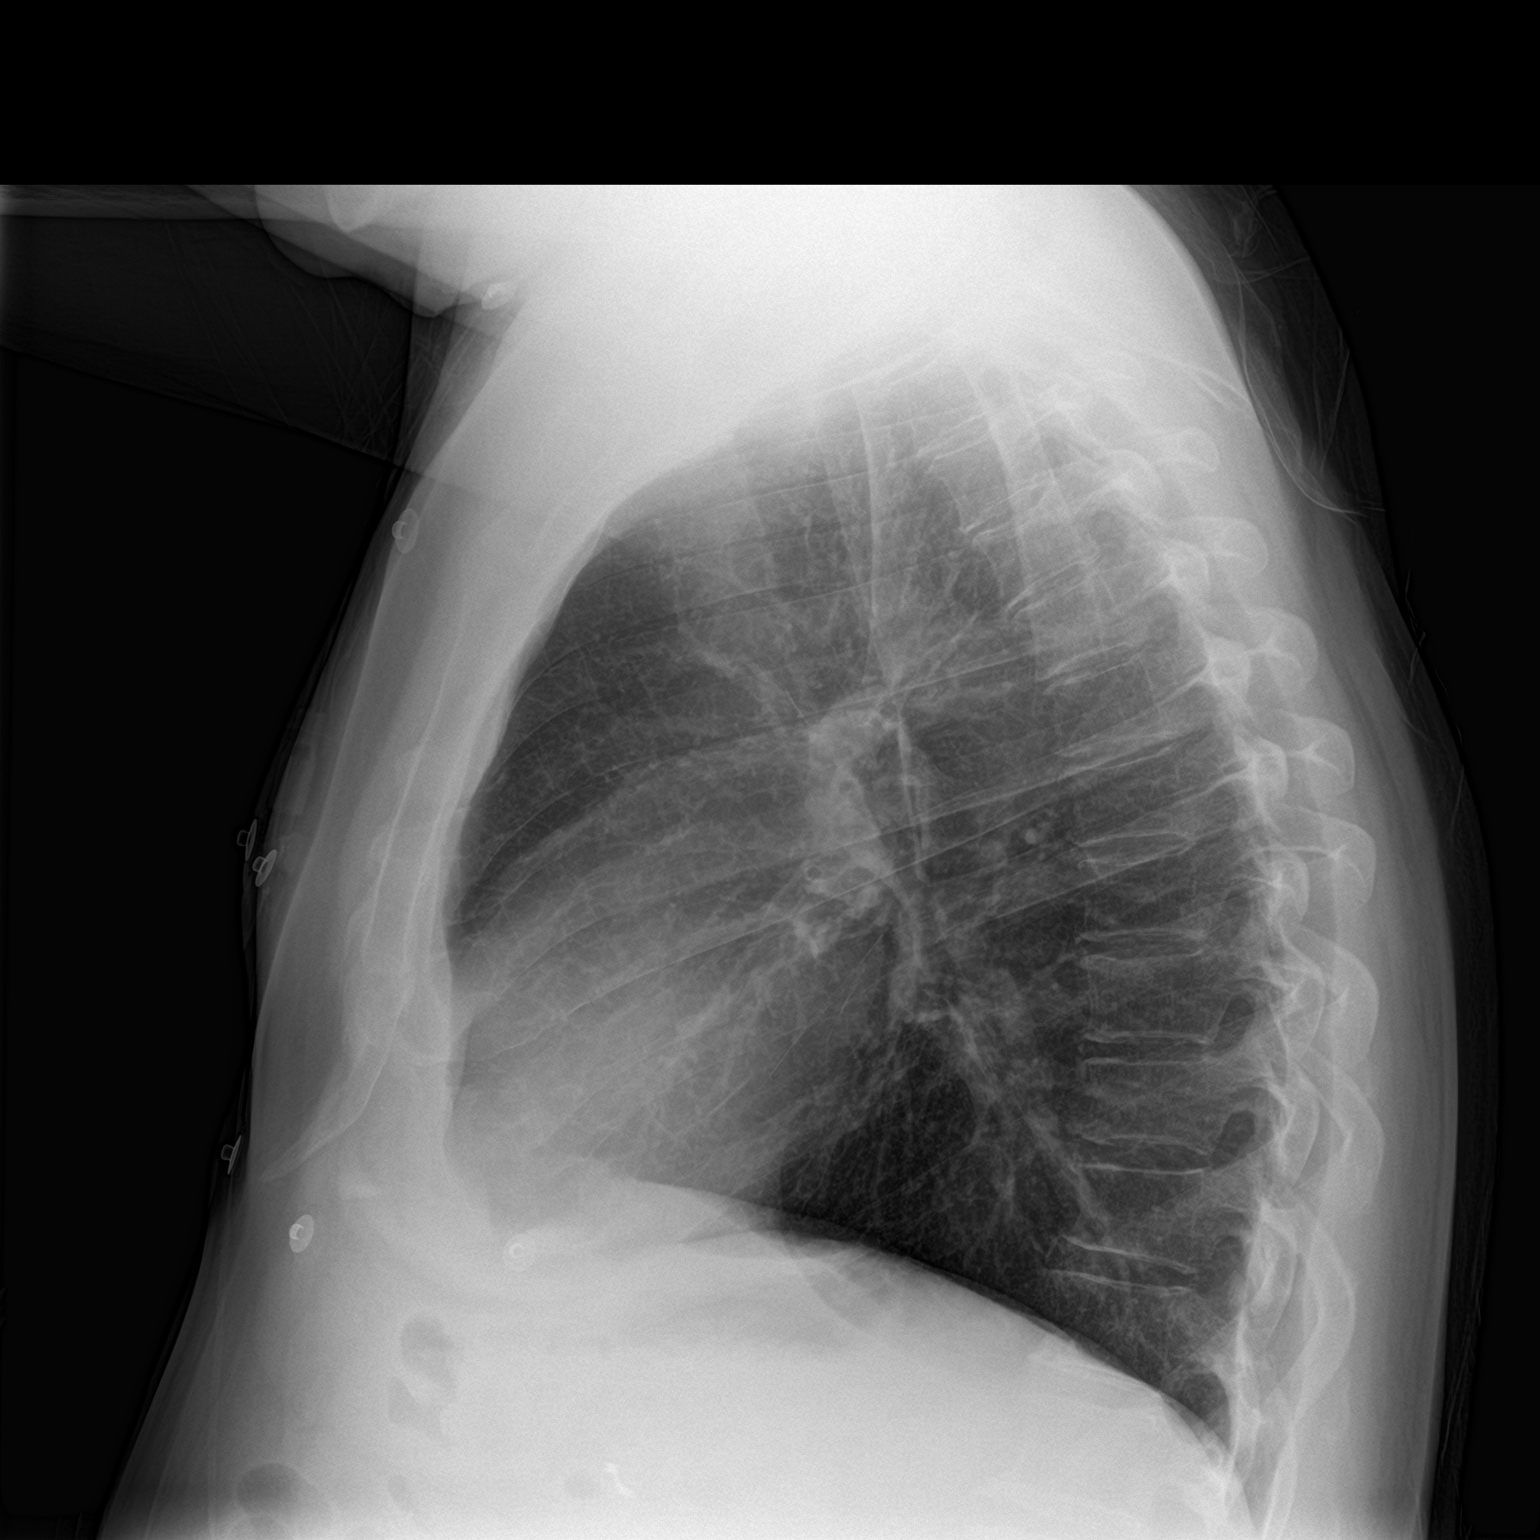

[2 of 2 positions shown; findings below may reference images not displayed]

FINDINGS: The heart size and mediastinal contours are within normal limits.
Both lungs are clear. The visualized skeletal structures are
unremarkable.
IMPRESSION: No active cardiopulmonary disease.

## 2023-11-10 ENCOUNTER — Encounter: Payer: Self-pay | Admitting: Emergency Medicine

## 2023-11-10 ENCOUNTER — Emergency Department
Admission: EM | Admit: 2023-11-10 | Discharge: 2023-11-11 | Disposition: A | Discharge status: Home / Self Care | Attending: Emergency Medicine | Admitting: Emergency Medicine

## 2023-11-10 ENCOUNTER — Other Ambulatory Visit: Payer: Self-pay

## 2023-11-10 DIAGNOSIS — I10 Essential (primary) hypertension: Secondary | ICD-10-CM | POA: Diagnosis not present

## 2023-11-10 DIAGNOSIS — R456 Violent behavior: Secondary | ICD-10-CM | POA: Diagnosis present

## 2023-11-10 DIAGNOSIS — F329 Major depressive disorder, single episode, unspecified: Secondary | ICD-10-CM | POA: Insufficient documentation

## 2023-11-10 DIAGNOSIS — F419 Anxiety disorder, unspecified: Secondary | ICD-10-CM | POA: Insufficient documentation

## 2023-11-10 DIAGNOSIS — E876 Hypokalemia: Secondary | ICD-10-CM | POA: Diagnosis not present

## 2023-11-10 DIAGNOSIS — R45851 Suicidal ideations: Secondary | ICD-10-CM | POA: Diagnosis not present

## 2023-11-10 DIAGNOSIS — Y908 Blood alcohol level of 240 mg/100 ml or more: Secondary | ICD-10-CM | POA: Insufficient documentation

## 2023-11-10 DIAGNOSIS — F10920 Alcohol use, unspecified with intoxication, uncomplicated: Secondary | ICD-10-CM | POA: Diagnosis not present

## 2023-11-10 DIAGNOSIS — Z79899 Other long term (current) drug therapy: Secondary | ICD-10-CM | POA: Insufficient documentation

## 2023-11-10 DIAGNOSIS — F339 Major depressive disorder, recurrent, unspecified: Secondary | ICD-10-CM

## 2023-11-10 DIAGNOSIS — F1090 Alcohol use, unspecified, uncomplicated: Secondary | ICD-10-CM

## 2023-11-10 DIAGNOSIS — J449 Chronic obstructive pulmonary disease, unspecified: Secondary | ICD-10-CM | POA: Diagnosis not present

## 2023-11-10 DIAGNOSIS — F29 Unspecified psychosis not due to a substance or known physiological condition: Secondary | ICD-10-CM | POA: Diagnosis not present

## 2023-11-10 LAB — COMPREHENSIVE METABOLIC PANEL WITH GFR
ALT: 17 U/L (ref 0–44)
AST: 32 U/L (ref 15–41)
Albumin: 3.9 g/dL (ref 3.5–5.0)
Alkaline Phosphatase: 82 U/L (ref 38–126)
Anion gap: 9 (ref 5–15)
BUN: 9 mg/dL (ref 6–20)
CO2: 26 mmol/L (ref 22–32)
Calcium: 9.7 mg/dL (ref 8.9–10.3)
Chloride: 108 mmol/L (ref 98–111)
Creatinine, Ser: 0.61 mg/dL (ref 0.61–1.24)
GFR, Estimated: >60 mL/min (ref >60)
Glucose, Bld: 104 mg/dL — ABNORMAL HIGH (ref 70–99)
Potassium: 2.7 mmol/L — CL (ref 3.5–5.1)
Sodium: 143 mmol/L (ref 135–145)
Total Bilirubin: 0.5 mg/dL (ref 0.0–1.2)
Total Protein: 7.5 g/dL (ref 6.5–8.1)

## 2023-11-10 LAB — CBC
HCT: 45.9 % (ref 39.0–52.0)
Hemoglobin: 15.4 g/dL (ref 13.0–17.0)
MCH: 34.8 pg — ABNORMAL HIGH (ref 26.0–34.0)
MCHC: 33.6 g/dL (ref 30.0–36.0)
MCV: 103.6 fL — ABNORMAL HIGH (ref 80.0–100.0)
Platelets: 254 K/uL (ref 150–400)
RBC: 4.43 MIL/uL (ref 4.22–5.81)
RDW: 14.5 % (ref 11.5–15.5)
WBC: 7.9 K/uL (ref 4.0–10.5)
nRBC: 0 % (ref 0.0–0.2)

## 2023-11-10 LAB — URINE DRUG SCREEN, QUALITATIVE (ARMC ONLY)
Amphetamines, Ur Screen: NOT DETECTED
Barbiturates, Ur Screen: NOT DETECTED
Benzodiazepine, Ur Scrn: NOT DETECTED
Cannabinoid 50 Ng, Ur ~~LOC~~: NOT DETECTED
Cocaine Metabolite,Ur ~~LOC~~: NOT DETECTED
MDMA (Ecstasy)Ur Screen: NOT DETECTED
Methadone Scn, Ur: NOT DETECTED
Opiate, Ur Screen: NOT DETECTED
Phencyclidine (PCP) Ur S: NOT DETECTED
Tricyclic, Ur Screen: NOT DETECTED

## 2023-11-10 LAB — ETHANOL: Alcohol, Ethyl (B): 388 mg/dL (ref <15)

## 2023-11-10 MED ORDER — POTASSIUM CHLORIDE CRYS ER 20 MEQ PO TBCR
40.0000 meq | EXTENDED_RELEASE_TABLET | Freq: Once | ORAL | Status: AC
  Administered 2023-11-10: 40 meq via ORAL
  Filled 2023-11-10: qty 2

## 2023-11-10 MED ORDER — POTASSIUM CHLORIDE 10 MEQ/100ML IV SOLN
10.0000 meq | INTRAVENOUS | Status: AC
  Administered 2023-11-10 (×2): 10 meq via INTRAVENOUS
  Filled 2023-11-10 (×2): qty 100

## 2023-11-10 NOTE — ED Notes (Signed)
 This RN introduced self to pt. Pt stated he didn't know why he was here. He said he didn't want to hurt himself or anyone else. I asked the pt if he had been drinking today to which he stated, No more than normal.

## 2023-11-10 NOTE — BH Assessment (Signed)
 This Clinical research associate contacted [facility] via phone to request an assessment, request has been made, assessment is currently pending

## 2023-11-10 NOTE — ED Notes (Signed)
 ED provider at bedside.

## 2023-11-10 NOTE — ED Notes (Signed)
 Assisted pt to the bathroom to collect urine sample. Pt was unsteady on feet at this time, urine was collected along with blood draw that was also collected at this time and sent off to lab.

## 2023-11-10 NOTE — ED Provider Notes (Signed)
 [facility] Provider Note    Event Date/Time   First MD Initiated Contact with Patient 11/10/23 1958     (approximate)   History   Alcohol Intoxication and Psychiatric Evaluation   HPI  Darrell Santiago is a 58 year old male with history of COPD, HTN, alcohol use disorder presenting to the emergency department for evaluation of alcohol intoxication and possible suicidal ideation.  Arrives voluntarily from [facility] department, did admit to drinking alcohol.  On my evaluation, patient unsure where he is or why he is here.  Denies SI, HI, history of this.      Physical Exam   Triage Vital Signs: ED Triage Vitals  Encounter Vitals Group     BP 11/10/23 1935 95/67     Girls Systolic BP Percentile --      Girls Diastolic BP Percentile --      Boys Systolic BP Percentile --      Boys Diastolic BP Percentile --      Pulse Rate 11/10/23 1935 92     Resp 11/10/23 1935 15     Temp 11/10/23 1935 98.7 F (37.1 C)     Temp Source 11/10/23 1935 Oral     SpO2 11/10/23 1935 96 %     Weight 11/10/23 1936 185 lb (83.9 kg)     Height 11/10/23 1936 5' 9 (1.753 m)     Head Circumference --      Peak Flow --      Pain Score 11/10/23 1936 0     Pain Loc --      Pain Education --      Exclude from Growth Chart --     Most recent vital signs: Vitals:   11/10/23 1935  BP: 95/67  Pulse: 92  Resp: 15  Temp: 98.7 F (37.1 C)  SpO2: 96%     General: Awake, interactive  CV:  Regular rate, good peripheral perfusion.  Resp:  Unlabored respirations.  Abd:  Nondistended.  Neuro:  Symmetric facial movement, fluid speech   ED Results / Procedures / Treatments   Labs (all labs ordered are listed, but only abnormal results are displayed) Labs Reviewed  COMPREHENSIVE METABOLIC PANEL WITH GFR - Abnormal; Notable for the following components:      Result Value   Potassium 2.7 (*)    Glucose, Bld 104 (*)    All other components within normal limits  ETHANOL -  Abnormal; Notable for the following components:   Alcohol, Ethyl (B) 388 (*)    All other components within normal limits  CBC - Abnormal; Notable for the following components:   MCV 103.6 (*)    MCH 34.8 (*)    All other components within normal limits  URINE DRUG SCREEN, QUALITATIVE ([facility] ONLY)     EKG EKG independently reviewed and interpreted by myself demonstrates:    RADIOLOGY Imaging independently reviewed and interpreted by myself demonstrates:   Formal Radiology Read:  No results found.  PROCEDURES:  Critical Care performed: Yes, see critical care procedure note(s)  CRITICAL CARE Performed by: Nilsa Dade   Total critical care time: 31 minutes  Critical care time was exclusive of separately billable procedures and treating other patients.  Critical care was necessary to treat or prevent imminent or life-threatening deterioration.  Critical care was time spent personally by me on the following activities: development of treatment plan with patient and/or surrogate as well as nursing, discussions with consultants, evaluation of patient's response to treatment, examination  of patient, obtaining history from patient or surrogate, ordering and performing treatments and interventions, ordering and review of laboratory studies, ordering and review of radiographic studies, pulse oximetry and re-evaluation of patient's condition.   Procedures   MEDICATIONS ORDERED IN ED: Medications  potassium chloride  SA (KLOR-CON  M) CR tablet 40 mEq (has no administration in time range)  potassium chloride  SA (KLOR-CON  M) CR tablet 40 mEq (40 mEq Oral Given 11/10/23 2130)  potassium chloride  10 mEq in 100 mL IVPB (0 mEq Intravenous Stopped 11/10/23 2335)     IMPRESSION / MDM / ASSESSMENT AND PLAN / ED COURSE  I reviewed the triage vital signs and the nursing notes.  Differential diagnosis includes, but is not limited to, alcohol intoxication, suicidal ideation, substance-induced mood  disorder, electrolyte abnormality  Patient's presentation is most consistent with acute presentation with potential threat to life or bodily function.  58 year old male presenting to the emergency department with alcohol intoxication, possible reported suicidal thoughts though denying here.  Stable vitals on presentation. CBC overall reassuring.  CMP notable for hypokalemia, history of similar at 2.7.  EtOH significantly elevated.  Ordered for IV and oral potassium repletion.  When this is completed, do think patient can be medically cleared for psychiatric evaluation.  He has not voiced any suicidal concerns here and arrives voluntarily with [facility] department.  I called his spouse at the number listed in his chart, but it went to voicemail.  In the setting of this, we will hold off on IVC.  If patient obtains clinical sobriety here and continues to deny any SI, may be appropriate for discharge.  Signed out to oncoming physician at 0015 pending metabolization, psychiatric evaluation, and disposition.      FINAL CLINICAL IMPRESSION(S) / ED DIAGNOSES   Final diagnoses:  Alcoholic intoxication without complication (HCC)  Hypokalemia  Suicidal ideation     Rx / DC Orders   ED Discharge Orders     None        Note:  This document was prepared using Dragon voice recognition software and may include unintentional dictation errors.   Levander Slate, MD 11/11/23 [phone]

## 2023-11-10 NOTE — ED Notes (Signed)
 Patient dressed out into hospital provided attire per this RN and EDT, Manuelita.  Belongings placed into labeled bag and handed off to quad RN.    Belongings include:  Black Tshirt Administrator, [employer] Suspenders Underwear Socks

## 2023-11-10 NOTE — ED Notes (Signed)
Pt given a warm blanket 

## 2023-11-10 NOTE — ED Notes (Signed)
 When inquiring further, patient does wish to have a psychiatric evaluation and would also like help w/ his alcohol problem.  Patient states, I'm crazy, I wish I was just locked up somewhere.  Continues to deny SI/HI.

## 2023-11-10 NOTE — ED Notes (Signed)
 Pt given a urinal. 120 ml output.

## 2023-11-10 NOTE — ED Triage Notes (Signed)
 Pt in via [facility] voluntarily; admits ETOH, patient alert to self and place, is unable to tell me why he is here.  Per deputy, patients wife called out because patient has expressed wanting to harm others.    Patient denies SI/HI at this time.  Patient cooperative at this time, appears intoxicated.

## 2023-11-11 DIAGNOSIS — F1090 Alcohol use, unspecified, uncomplicated: Secondary | ICD-10-CM | POA: Diagnosis not present

## 2023-11-11 DIAGNOSIS — F339 Major depressive disorder, recurrent, unspecified: Secondary | ICD-10-CM

## 2023-11-11 DIAGNOSIS — F329 Major depressive disorder, single episode, unspecified: Secondary | ICD-10-CM

## 2023-11-11 DIAGNOSIS — F419 Anxiety disorder, unspecified: Secondary | ICD-10-CM

## 2023-11-11 MED ORDER — SERTRALINE HCL 50 MG PO TABS: 50.0000 mg | ORAL_TABLET | Freq: Every day | ORAL | 3 refills | Status: AC

## 2023-11-11 MED ORDER — POTASSIUM CHLORIDE CRYS ER 20 MEQ PO TBCR
40.0000 meq | EXTENDED_RELEASE_TABLET | Freq: Once | ORAL | Status: AC
  Administered 2023-11-11: 40 meq via ORAL
  Filled 2023-11-11: qty 2

## 2023-11-11 NOTE — ED Notes (Signed)
 PT's wife called about getting a set of keys from pt. RN stated it should be ok as long as pt was ok with it. She stated it is their vehicle together. She would be here to pick up the keys after 0900 on 7/26.

## 2023-11-11 NOTE — Discharge Instructions (Addendum)
 Avoid alcohol use.  Start taking the Zoloft  as prescribed.  Make an appointment to follow-up with an outpatient psychiatry provider.  Return to the ER immediately for new, worsening, or persistent symptoms, especially any thoughts of wanting to harm yourself or others.

## 2023-11-11 NOTE — ED Provider Notes (Signed)
-----------------------------------------   6:18 AM on 11/11/2023 -----------------------------------------  I consulted and discussed the case with NP foresee from psychiatry who has evaluated the patient.  She recommends discharge with a prescription for Zoloft  and outpatient follow-up referrals.  On my reassessment the patient is calm and cooperative.  He appears clinically sober.  He does not demonstrate any signs of withdrawal.  There is no tremors and his vital signs are stable.  He denies SI or HI.  He is stable for discharge at this time.   Jacolyn Pae, MD 11/11/23 [phone]

## 2023-11-11 NOTE — Consult Note (Signed)
 [facility] Consult Note  Patient Name: Darrell Santiago MRN: [id number] DOB: [birth year] DATE OF Consult: 11/11/2023  PRIMARY PSYCHIATRIC DIAGNOSES  - Alcohol use disorder - Major depressive disorder - Anxiety disorder   RECOMMENDATIONS  Inpatient psych admission is not recommended.  Medication recommendations:  Restart Zoloft  50 mg daily to address comorbid depression and anxiety.  Non-Medication/therapeutic recommendations: Initiate referral to outpatient psychiatry for medication management and therapy. Provide resources for outpatient substance abuse treatment.   Communication: Treatment team members (and family members if applicable) who were involved in treatment/care discussions and planning, and with whom we spoke or engaged with via secure text/chat, include the following: patient's treatment team. Thank you for involving us  in the care of this patient. If you have any additional questions or concerns, please call [phone] and ask for me or the provider on-call.  TELEPSYCHIATRY ATTESTATION & CONSENT  As the provider for this telehealth consult, I attest that I verified the patient's identity using two separate identifiers, introduced myself to the patient, provided my credentials, disclosed my location, and performed this encounter via a HIPAA-compliant, real-time, face-to-face, two-way, interactive audio and video platform and with the full consent and agreement of the patient (or guardian as applicable.)  Patient physical location: [place]. Telehealth provider physical location: home office in state of [place].  Video start time: 0448 (Central Time) Video end time: 0505 (Central Time)  IDENTIFYING DATA  Darrell Santiago is a 58 y.o. year-old male for whom a psychiatric consultation has been ordered by the primary provider. The patient was identified using two separate identifiers.  CHIEF COMPLAINT/REASON FOR CONSULT  - I went crazy. Okay. - Pt. presented to the  hospital after an episode of heavy alcohol use, with confusion regarding events, and concern from his wife about his behavior.  HISTORY OF PRESENT ILLNESS (HPI)  A 58 year old man was evaluated in the hospital following an episode described as going crazy, though he was unable to provide specific details regarding the events leading to his admission. It was elicited that he had been drinking heavily prior to the incident, with daily alcohol consumption reported as a couple shots per day, though he admitted to drinking more than usual on the day in question. He has a longstanding history of alcohol use, beginning at age 60, and has never received formal treatment for alcohol use disorder. Periods of abstinence were noted approximately ten and four years ago, lasting eight and six months respectively, which he attributed to personal choice and a desire to prove he could stop. He expressed confidence in his ability to quit drinking at will. No history of alcohol withdrawal seizures or hallucinations was endorsed, and he denied current symptoms of nausea, headache, or feeling shaky.  Collateral information from his wife indicated that he had been drinking heavily, became moody, and expressed a desire for help. She reported a prior history of depression and anxiety for which he was prescribed Zoloft  approximately one year ago; however, he discontinued the medication when his physician relocated, and has not resumed psychiatric care since. He was unable to recall the dose of Zoloft  previously prescribed. He stated he is not currently experiencing depressive symptoms and denied any history of self-harm or suicidal ideation. He also denied any intent or attempts to harm others, despite earlier concerns raised by his wife that prompted her to contact authorities. No psychotic symptoms, such as hallucinations or paranoia, were reported during the interview.  He expressed discomfort with the telehealth format,  preferring in-person interactions,  but was cooperative throughout the assessment. Plans were discussed to restart Zoloft  at 50 mg and arrange outpatient psychiatric follow-up. His wife has reportedly removed all alcohol from the home. He voiced understanding of the discharge process and agreed to the proposed plan of care.  PAST PSYCHIATRIC HISTORY  - A history of depression and anxiety was reported, with Zoloft  prescribed by a previous doctor approximately one year ago. No history of suicidal ideation, suicide attempts, or self-harm was endorsed. - No prior psychiatric hospitalizations, therapy reported. Otherwise as per HPI above.  PAST MEDICAL HISTORY  Past Medical History:  Diagnosis Date   Abnormal EKG    Allergy    Seasonal   Anxiety    Chicken pox    COPD (chronic obstructive pulmonary disease) (HCC)    COVID-19    04/20/21   Depression    GERD (gastroesophageal reflux disease)    Hyperlipidemia    Hypertension    Urine incontinence      HOME MEDICATIONS  Facility Ordered Medications  Medication   [COMPLETED] potassium chloride  SA (KLOR-CON  M) CR tablet 40 mEq   [COMPLETED] potassium chloride  10 mEq in 100 mL IVPB   [COMPLETED] potassium chloride  SA (KLOR-CON  M) CR tablet 40 mEq   PTA Medications  Medication Sig   acetaminophen  (TYLENOL ) 325 MG tablet Take 2 tablets (650 mg total) by mouth every 6 (six) hours as needed for mild pain (or Fever >/= 101).   budesonide -formoterol  (SYMBICORT ) 160-4.5 MCG/ACT inhaler Inhale 2 puffs into the lungs 2 (two) times daily. Rinse mouth after use   hydrOXYzine  (ATARAX ) 25 MG tablet Take 1 tablet (25 mg total) by mouth daily as needed.   ipratropium-albuterol  (DUONEB) 0.5-2.5 (3) MG/3ML SOLN Inhale 3 mLs into the lungs every 6 (six) hours as needed.   potassium chloride  SA (KLOR-CON  M) 20 MEQ tablet Take 1 tablet (20 mEq total) by mouth 2 (two) times daily.   fluticasone  (FLONASE ) 50 MCG/ACT nasal spray Place 2 sprays into both nostrils  daily.   cetirizine  (ZYRTEC  ALLERGY) 10 MG tablet Take 1 tablet (10 mg total) by mouth daily.   furosemide  (LASIX ) 20 MG tablet Take 1 tablet (20 mg total) by mouth daily.   benzonatate  (TESSALON ) 100 MG capsule Take 1-2 capsules (100-200 mg total) by mouth 3 (three) times daily as needed.     ALLERGIES  No Known Allergies  SOCIAL & SUBSTANCE USE HISTORY  Social History   Socioeconomic History   Marital status: Married    Spouse name: Not on file   Number of children: Not on file   Years of education: Not on file   Highest education level: Not on file  Occupational History   Not on file  Tobacco Use   Smoking status: Every Day    Current packs/day: 1.00    Average packs/day: 1 pack/day for 26.0 years (26.0 ttl pk-yrs)    Types: Cigarettes   Smokeless tobacco: Never   Tobacco comments:    little less than one ppd  Vaping Use   Vaping status: Never Used  Substance and Sexual Activity   Alcohol use: Yes    Alcohol/week: 21.0 standard drinks of alcohol    Types: 21 Shots of liquor per week   Drug use: No   Sexual activity: Yes    Partners: Female  Other Topics Concern   Not on file  Social History Narrative   Married   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  Caffeine- Sodas 2-3 a day, tea occasionally, no coffee       Likes Paramedic   Social Drivers of Corporate investment banker Strain: Low Risk  (05/02/2023)   Received from [facility]   Overall Financial Resource Strain (CARDIA)    Difficulty of Paying Living Expenses: Not hard at all  Food Insecurity: No Food Insecurity (05/02/2023)   [facility]   Hunger Vital Sign    Within the past 12 months, you worried that your food would run out before you got the money to buy more.: Never true    Within the past 12 months, the food you bought just didn't last and you didn't have money to get more.: Never true  Transportation Needs:  No Transportation Needs (05/02/2023)   Received from [facility] - Transportation    In the past 12 months, has lack of transportation kept you from medical appointments or from getting medications?: No    Lack of Transportation (Non-Medical): No  Physical Activity: Not on file  Stress: Not on file  Social Connections: Not on file   Social History   Tobacco Use  Smoking Status Every Day   Current packs/day: 1.00   Average packs/day: 1 pack/day for 26.0 years (26.0 ttl pk-yrs)   Types: Cigarettes  Smokeless Tobacco Never  Tobacco Comments   little less than one ppd   Social History   Substance and Sexual Activity  Alcohol Use Yes   Alcohol/week: 21.0 standard drinks of alcohol   Types: 21 Shots of liquor per week   Social History   Substance and Sexual Activity  Drug Use No    Additional pertinent information .  FAMILY HISTORY  Family History  Problem Relation Age of Onset   Alcohol abuse Father    Diabetes Father    Cancer Father        ?stage IV thyroid  with met   Cancer Maternal Grandfather        Colon Cancer   Family Psychiatric History (if known):  denies  MENTAL STATUS EXAM (MSE)  General Appearance and Behavior: Well groomed and cooperative. Rapport was established, though he expressed discomfort with the virtual format, stating, I don't like computers and I'm old school.  Motor Abnormalities: No abnormal motor movements.  Affect: Affect appeared congruent with the content discussed; he appeared frustrated with the virtual interview process but was able to engage appropriately.  Mood: I'm fine, ma'am. Denied feeling depressed.  Thought Content: No evidence of suicidal or homicidal ideation. He denied any intent to harm himself or others, stating, I don't hurt other people and No, ma'am when asked about self-harm. There is no mention of auditory hallucinations, visual hallucinations, paranoia, obsessive thoughts,  delusional thoughts, or intrusive thoughts.  Thought Process: Linear and goal directed.  Cognition: Grossly intact attention and memory. He was able to answer questions appropriately.  Orientation: Alert and oriented.  SI, HI, Violent Ideation: None mentioned.  Insight and Judgment: Demonstrated some insight into his alcohol use, acknowledging he drinks daily and has stopped in the past. Willing to accept treatment and follow-up care.   VITALS  Blood pressure 107/73, pulse 83, temperature 98.7 F (37.1 C), temperature source Oral, resp. rate 15, height 5' 9 (1.753 m), weight 83.9 kg, SpO2 96%.  LABS  Admission on 11/10/2023  Component Date Value Ref Range Status   Sodium 11/10/2023 143  135 - 145 mmol/L Final  Potassium 11/10/2023 2.7 (LL)  3.5 - 5.1 mmol/L Final   Comment: CRITICAL RESULT CALLED TO, READ BACK BY AND VERIFIED WITH KIM EVANS,RN 11/10/23 2048 JL    Chloride 11/10/2023 108  98 - 111 mmol/L Final   CO2 11/10/2023 26  22 - 32 mmol/L Final   Glucose, Bld 11/10/2023 104 (H)  70 - 99 mg/dL Final   Glucose reference range applies only to samples taken after fasting for at least 8 hours.   BUN 11/10/2023 9  6 - 20 mg/dL Final   Creatinine, Ser 11/10/2023 0.61  0.61 - 1.24 mg/dL Final   Calcium  11/10/2023 9.7  8.9 - 10.3 mg/dL Final   Total Protein 92/74/7974 7.5  6.5 - 8.1 g/dL Final   Albumin 92/74/7974 3.9  3.5 - 5.0 g/dL Final   AST 92/74/7974 32  15 - 41 U/L Final   ALT 11/10/2023 17  0 - 44 U/L Final   Alkaline Phosphatase 11/10/2023 82  38 - 126 U/L Final   Total Bilirubin 11/10/2023 0.5  0.0 - 1.2 mg/dL Final   GFR, Estimated 11/10/2023 >60  >60 mL/min Final   Comment: (NOTE) Calculated using the CKD-EPI Creatinine Equation (2021)    Anion gap 11/10/2023 9  5 - 15 Final   Performed at [facility], [address] [place], [place] [address]   Alcohol, Ethyl (B) 11/10/2023 388 (HH)  <15 mg/dL Final   Comment: CRITICAL RESULT CALLED TO, READ BACK BY  AND VERIFIED WITH KIM EVANS,RN 11/10/23 2050 JL (NOTE) For medical purposes only. Performed at [facility], [address] [place], [place] [address]    WBC 11/10/2023 7.9  4.0 - 10.5 K/uL Final   RBC 11/10/2023 4.43  4.22 - 5.81 MIL/uL Final   Hemoglobin 11/10/2023 15.4  13.0 - 17.0 g/dL Final   HCT 92/74/7974 45.9  39.0 - 52.0 % Final   MCV 11/10/2023 103.6 (H)  80.0 - 100.0 fL Final   MCH 11/10/2023 34.8 (H)  26.0 - 34.0 pg Final   MCHC 11/10/2023 33.6  30.0 - 36.0 g/dL Final   RDW 92/74/7974 14.5  11.5 - 15.5 % Final   Platelets 11/10/2023 254  150 - 400 K/uL Final   nRBC 11/10/2023 0.0  0.0 - 0.2 % Final   Performed at [facility], [address] [place], [place] [address]   Tricyclic, Ur Screen 11/10/2023 NONE DETECTED  NONE DETECTED Final   Amphetamines, Ur Screen 11/10/2023 NONE DETECTED  NONE DETECTED Final   MDMA (Ecstasy)Ur Screen 11/10/2023 NONE DETECTED  NONE DETECTED Final   Cocaine Metabolite,Ur [place] 11/10/2023 NONE DETECTED  NONE DETECTED Final   Opiate, Ur Screen 11/10/2023 NONE DETECTED  NONE DETECTED Final   Phencyclidine (PCP) Ur S 11/10/2023 NONE DETECTED  NONE DETECTED Final   Cannabinoid 50 Ng, Ur [place] 11/10/2023 NONE DETECTED  NONE DETECTED Final   Barbiturates, Ur Screen 11/10/2023 NONE DETECTED  NONE DETECTED Final   Benzodiazepine, Ur Scrn 11/10/2023 NONE DETECTED  NONE DETECTED Final   Methadone Scn, Ur 11/10/2023 NONE DETECTED  NONE DETECTED Final   Comment: (NOTE) Tricyclics + metabolites, urine    Cutoff 1000 ng/mL Amphetamines + metabolites, urine  Cutoff 1000 ng/mL MDMA (Ecstasy), urine              Cutoff 500 ng/mL Cocaine Metabolite, urine          Cutoff 300 ng/mL Opiate + metabolites, urine        Cutoff 300 ng/mL Phencyclidine (  PCP), urine         Cutoff 25 ng/mL Cannabinoid, urine                 Cutoff 50 ng/mL Barbiturates + metabolites, urine  Cutoff 200 ng/mL Benzodiazepine, urine              Cutoff 200  ng/mL Methadone, urine                   Cutoff 300 ng/mL  The urine drug screen provides only a preliminary, unconfirmed analytical test result and should not be used for non-medical purposes. Clinical consideration and professional judgment should be applied to any positive drug screen result due to possible interfering substances. A more specific alternate chemical method must be used in order to obtain a confirmed analytical result. Gas chromatography / mass spectrometry (GC/MS) is the preferred confirm                          atory method. Performed at [facility], [address], [place], [place] [address]     PSYCHIATRIC REVIEW OF SYSTEMS (ROS)  ROS: Notable for the following relevant positive findings: ROS  Additional findings:      Musculoskeletal: No abnormal movements observed      Gait & Station: Laying/Sitting      Pain Screening: Denies      Nutrition & Dental Concerns: none reported  RISK FORMULATION/ASSESSMENT  Is the patient experiencing any suicidal or homicidal ideations: No       Explain if yes:  Protective factors considered for safety management:  include a supportive spouse, willingness to engage in treatment, and prior ability to abstain from alcohol for extended periods. The patient denied active or passive suicidal ideation, denied intent or plan to harm self or others, and his wife expressed no current concerns for his safety.   Risk factors/concerns considered for safety management:  include male gender, chronic daily alcohol use since age 23, and a history of depression and anxiety previously treated with Zoloft .   Is there a safety management plan with the patient and treatment team to minimize risk factors and promote protective factors: Yes           Explain: medication management, referral to outpatient psych Is crisis care placement or psychiatric hospitalization recommended: No     Based on my current evaluation and risk assessment,  patient is determined at this time to be at:  Long-term suicide risk is considered moderate due to chronic alcohol use and past depression, but short-term suicide risk is low given the absence of current suicidal ideation, plan, or intent, and the presence of protective factors.  *RISK ASSESSMENT Risk assessment is a dynamic process; it is possible that this patient's condition, and risk level, may change. This should be re-evaluated and managed over time as appropriate. Please re-consult psychiatric consult services if additional assistance is needed in terms of risk assessment and management. If your team decides to discharge this patient, please advise the patient how to best access emergency psychiatric services, or to call 911, if their condition worsens or they feel unsafe in any way.   Ines Hock, NP Tele[facility] Consult Services

## 2023-11-11 NOTE — BH Assessment (Signed)
 Comprehensive Clinical Assessment (CCA) Note  11/11/2023 Darrell Santiago [id number]  Chief Complaint: Patient is a 58 year old male presenting to [facility] ED voluntarily. Per triage note Pt in via [facility] voluntarily; admits ETOH, patient alert to self and place, is unable to tell me why he is here.  Per deputy, patients wife called out because patient has expressed wanting to harm others. Patient denies SI/HI at this time.  Patient cooperative at this time, appears intoxicated. During assessment patient appears alert and oriented x4, calm and cooperative. Patient reports I went crazy but I'm not exactly sure why I'm here. Patient is able to recall drinking last night I drank more than I should have. Patient reports that he drinks daily  a couple of shots and has been drinking since the age of 59. Patient reports some periods of sobriety I've stopped drinking twice, once 10 years ago for 8 months and the other time 4 years ago for 4 months, I started back drinking because I wanted to. Patient denies any HI and does not recall making any homicidal comments. Patient also denies SI/AH/VH  Per [facility] Provider NP Velna patient is psyc cleared. Patient is provided with outpatient psychiatric referrals, patient is receptive.  Chief Complaint  Patient presents with   Alcohol Intoxication   Psychiatric Evaluation   Visit Diagnosis: Alcohol intoxication.     CCA Screening, Triage and Referral (STR)  Patient Reported Information How did you hear about us ? Legal System  Referral name: No data recorded Referral phone number: No data recorded  Whom do you see for routine medical problems? No data recorded Practice/Facility Name: No data recorded Practice/Facility Phone Number: No data recorded Name of Contact: No data recorded Contact Number: No data recorded Contact Fax Number: No data recorded Prescriber Name: No data recorded Prescriber Address (if known): No data recorded  What Is  the Reason for Your Visit/Call Today? Pt in via [facility] Sheriffs Dept voluntarily; admits ETOH, patient alert to self and place, is unable to tell me why he is here.  Per deputy, patients wife called out because patient has expressed wanting to harm others.       Patient denies SI/HI at this time.  Patient cooperative at this time, appears intoxicated.  How Long Has This Been Causing You Problems? > than 6 months  What Do You Feel Would Help You the Most Today? No data recorded  Have You Recently Been in Any Inpatient Treatment (Hospital/Detox/Crisis Center/28-Day Program)? No data recorded Name/Location of Program/Hospital:No data recorded How Long Were You There? No data recorded When Were You Discharged? No data recorded  Have You Ever Received Services From [facility] Before? No data recorded Who Do You See at [facility]? No data recorded  Have You Recently Had Any Thoughts About Hurting Yourself? No  Are You Planning to Commit Suicide/Harm Yourself At This time? No   Have you Recently Had Thoughts About Hurting Someone [place]? No  Explanation: No data recorded  Have You Used Any Alcohol or Drugs in the Past 24 Hours? Yes  How Long Ago Did You Use Drugs or Alcohol? No data recorded What Did You Use and How Much? Alcohol. Unknow amounts   Do You Currently Have a Therapist/Psychiatrist? No  Name of Therapist/Psychiatrist: No data recorded  Have You Been Recently Discharged From Any Office Practice or Programs? No  Explanation of Discharge From Practice/Program: No data recorded    CCA Screening Triage Referral Assessment Type of Contact: Face-to-Face  Is this Initial or Reassessment? No data recorded Date Telepsych consult ordered in [facility]:  No data recorded Time Telepsych consult ordered in [facility]:  No data recorded  Patient Reported Information Reviewed? No data recorded Patient Left Without Being Seen? No data recorded Reason for Not Completing Assessment: No data  recorded  Collateral Involvement: No data recorded  Does Patient Have a Court Appointed Legal Guardian? No data recorded Name and Contact of Legal Guardian: No data recorded If Minor and Not Living with Parent(s), Who has Custody? No data recorded Is CPS involved or ever been involved? Never  Is APS involved or ever been involved? Never   Patient Determined To Be At Risk for Harm To Self or Others Based on Review of Patient Reported Information or Presenting Complaint? No  Method: No data recorded Availability of Means: No data recorded Intent: No data recorded Notification Required: No data recorded Additional Information for Danger to Others Potential: No data recorded Additional Comments for Danger to Others Potential: No data recorded Are There Guns or Other Weapons in Your Home? No  Types of Guns/Weapons: No data recorded Are These Weapons Safely Secured?                            No data recorded Who Could Verify You Are Able To Have These Secured: No data recorded Do You Have any Outstanding Charges, Pending Court Dates, Parole/Probation? No data recorded Contacted To Inform of Risk of Harm To Self or Others: No data recorded  Location of Assessment: [facility] ED   Does Patient Present under Involuntary Commitment? No  IVC Papers Initial File Date: No data recorded  [place] of Residence: [place]   Patient Currently Receiving the Following Services: No data recorded  Determination of Need: Emergent (2 hours)   Options For Referral: No data recorded    CCA Biopsychosocial Intake/Chief Complaint:  No data recorded Current Symptoms/Problems: No data recorded  Patient Reported Schizophrenia/Schizoaffective Diagnosis in Past: No   Strengths: Patient is able to communicate his needs  Preferences: No data recorded Abilities: No data recorded  Type of Services Patient Feels are Needed: No data recorded  Initial Clinical Notes/Concerns: No data recorded  Mental  Health Symptoms Depression:  None   Duration of Depressive symptoms: No data recorded  Mania:  None   Anxiety:   Fatigue; Restlessness   Psychosis:  None   Duration of Psychotic symptoms: No data recorded  Trauma:  None   Obsessions:  None   Compulsions:  None   Inattention:  None   Hyperactivity/Impulsivity:  None   Oppositional/Defiant Behaviors:  None   Emotional Irregularity:  None   Other Mood/Personality Symptoms:  No data recorded   Mental Status Exam Appearance and self-care  Stature:  Average   Weight:  Overweight   Clothing:  Casual   Grooming:  Normal   Cosmetic use:  None   Posture/gait:  Normal   Motor activity:  Not Remarkable   Sensorium  Attention:  Normal   Concentration:  Normal   Orientation:  X5   Recall/memory:  Normal   Affect and Mood  Affect:  Appropriate   Mood:  Anxious   Relating  Eye contact:  Normal   Facial expression:  Responsive   Attitude toward examiner:  Cooperative   Thought and Language  Speech flow: Clear and Coherent   Thought content:  Appropriate to Mood and Circumstances   Preoccupation:  None  Hallucinations:  None   Organization:  No data recorded  [employer] of Knowledge:  Fair   Intelligence:  Average   Abstraction:  Normal   Judgement:  Hospital doctor   Insight:  Fair   Decision Making:  Impulsive   Social Functioning  Social Maturity:  Irresponsible   Social Judgement:  Normal   Stress  Stressors:  Family conflict   Coping Ability:  Normal   Skill Deficits:  None   Supports:  Family     Religion: Religion/Spirituality Are You A Religious Person?: No  Leisure/Recreation: Leisure / Recreation Do You Have Hobbies?: No  Exercise/Diet: Exercise/Diet Do You Exercise?: No Have You Gained or Lost A Significant Amount of Weight in the Past Six Months?: No Do You Follow a Special Diet?: No Do You Have Any Trouble Sleeping?:  No   CCA Employment/Education Employment/Work Situation: Employment / Work Situation Has Patient ever Been in Equities trader?: No  Education: Education Is Patient Currently Attending School?: No Did You Have An Individualized Education Program (IIEP): No Did You Have Any Difficulty At [employer]?: No Patient's Education Has Been Impacted by Current Illness: No   CCA Family/Childhood History Family and Relationship History: Family history Marital status: Married What types of issues is patient dealing with in the relationship?: Patient's wife concerned about his drinking Additional relationship information: Unknown  Childhood History:  Childhood History By whom was/is the patient raised?: Both parents Did patient suffer any verbal/emotional/physical/sexual abuse as a child?: No Did patient suffer from severe childhood neglect?: No Has patient ever been sexually abused/assaulted/raped as an adolescent or adult?: No Was the patient ever a victim of a crime or a disaster?: No Witnessed domestic violence?: No Has patient been affected by domestic violence as an adult?: No  Child/Adolescent Assessment:     CCA Substance Use Alcohol/Drug Use: Alcohol / Drug Use Pain Medications: see mar Prescriptions: see mar Over the Counter: see mar History of alcohol / drug use?: Yes Negative Consequences of Use: Personal relationships Substance #1 Name of Substance 1: Alcohol 1 - Age of First Use: 20 1 - Amount (size/oz): a couple of shots 1 - Frequency: daily 1 - Duration: 37 years 1 - Last Use / Amount: 11/10/23 1 - Method of Aquiring: purchasing 1- Route of Use: oral                       ASAM's:  Six Dimensions of Multidimensional Assessment  Dimension 1:  Acute Intoxication and/or Withdrawal Potential:      Dimension 2:  Biomedical Conditions and Complications:      Dimension 3:  Emotional, Behavioral, or Cognitive Conditions and Complications:     Dimension 4:   Readiness to Change:     Dimension 5:  Relapse, Continued use, or Continued Problem Potential:     Dimension 6:  Recovery/Living Environment:     ASAM Severity Score:    ASAM Recommended Level of Treatment: ASAM Recommended Level of Treatment: Level III Residential Treatment   Substance use Disorder (SUD) Substance Use Disorder (SUD)  Checklist Symptoms of Substance Use: Continued use despite having a persistent/recurrent physical/psychological problem caused/exacerbated by use, Continued use despite persistent or recurrent social, interpersonal problems, caused or exacerbated by use, Evidence of tolerance, Persistent desire or unsuccessful efforts to cut down or control use, Presence of craving or strong urge to use, Social, occupational, recreational activities given up or reduced due to use, Recurrent use  that results in a failure to fulfill major role obligations (work, school, home), Substance(s) often taken in larger amounts or over longer times than was intended  Recommendations for Services/Supports/Treatments:    DSM5 Diagnoses: Patient Active Problem List   Diagnosis Date Noted   Episode of recurrent major depressive disorder (HCC) 11/11/2023   Alcohol use disorder 11/11/2023   Atypical chest pain 06/27/2023   Hyponatremia 10/27/2022   AKI (acute kidney injury) (HCC) 10/27/2022   Alcohol dependence (HCC) 08/14/2022   History of TIA (transient ischemic attack) 08/14/2022   Colon cancer screening 08/14/2022   Anxiety 08/14/2022   Insomnia 08/14/2022   TIA (transient ischemic attack) 06/30/2022   Thrombocytopenia (HCC) 06/30/2022   Dyslipidemia 12/30/2021   Mood disorder (HCC) 12/30/2021   Elevated LFTs 12/30/2021   Adrenal adenoma, left 12/22/2021   Alcoholic hepatitis without ascites 12/22/2021   Diverticulosis 12/22/2021   Hypomagnesemia 12/22/2021   Alcohol withdrawal delirium, acute, mixed level of activity (HCC) 11/12/2021   Other chest pain 11/11/2021   Right knee  pain 10/26/2021   Lumbar radiculopathy 10/26/2021   Hypokalemia 04/26/2021   Abnormal LFTs 04/26/2021   Shortness of breath 04/26/2021   Neck mass 07/15/2020   Annual physical exam 07/15/2020   Tobacco dependence 07/15/2020   History of suicidal ideation 05/29/2020   Alcohol dependence with alcohol-induced mood disorder (HCC) 05/26/2020   Chronic maxillary sinusitis 05/26/2020   Brain atrophy (HCC) 05/26/2020   Depression, recurrent (HCC) 04/29/2020   Overweight (BMI 25.0-29.9) 09/05/2019   Alcohol-induced acute pancreatitis 02/07/2019   Atherosclerosis of native coronary artery of native heart with stable angina pectoris (HCC) 09/03/2018   Ground glass opacity present on imaging of lung 08/21/2018   Lung nodule 08/21/2018   CAD (coronary artery disease) 08/21/2018   Pleural effusion 08/02/2018   Chronic obstructive pulmonary disease (COPD) (HCC) 08/02/2018   Pancreatic pleural effusion 08/02/2018   Alcohol abuse 07/30/2018   Pancreatic pseudocyst    Pneumonia 07/23/2018   Bilateral impacted cerumen 05/29/2018   Pancreatitis 03/02/2018   Hepatic steatosis 01/19/2018   Stress fracture of left foot 01/19/2018   Elevated liver enzymes 09/19/2017   Hyperlipidemia 09/19/2017   Macrocytosis without anemia 06/05/2017   Anxiety and depression 04/13/2017   HTN (hypertension) 04/13/2017   Abnormal EKG 04/13/2017   Tobacco abuse 02/08/2017   Skin mass 08/12/2016   Neuropathy 02/05/2015   Gastroesophageal reflux disease 02/05/2015   Environmental allergies 02/05/2015    Patient Centered Plan: Patient is on the following Treatment Plan(s):  Substance Abuse   Referrals to Alternative Service(s): Referred to Alternative Service(s):   Place:   Date:   Time:    Referred to Alternative Service(s):   Place:   Date:   Time:    Referred to Alternative Service(s):   Place:   Date:   Time:    Referred to Alternative Service(s):   Place:   Date:   Time:      @BHCOLLABOFCARE @  [employer], LCAS-A

## 2023-11-11 NOTE — ED Notes (Signed)
Pt given out-patient psych resources.

## 2023-11-11 NOTE — ED Notes (Signed)
 Pt given belongings to change back into personal clothes.

## 2023-12-17 ENCOUNTER — Emergency Department

## 2023-12-17 ENCOUNTER — Telehealth: Admitting: Family

## 2023-12-17 ENCOUNTER — Inpatient Hospital Stay
Admission: EM | Admit: 2023-12-17 | Discharge: 2023-12-21 | DRG: 438 | Disposition: A | Discharge status: Home / Self Care | Attending: Internal Medicine | Admitting: Internal Medicine

## 2023-12-17 DIAGNOSIS — Z8673 Personal history of transient ischemic attack (TIA), and cerebral infarction without residual deficits: Secondary | ICD-10-CM

## 2023-12-17 DIAGNOSIS — I110 Hypertensive heart disease with heart failure: Secondary | ICD-10-CM | POA: Diagnosis present

## 2023-12-17 DIAGNOSIS — Z716 Tobacco abuse counseling: Secondary | ICD-10-CM

## 2023-12-17 DIAGNOSIS — F05 Delirium due to known physiological condition: Secondary | ICD-10-CM | POA: Diagnosis present

## 2023-12-17 DIAGNOSIS — F32A Depression, unspecified: Secondary | ICD-10-CM | POA: Diagnosis present

## 2023-12-17 DIAGNOSIS — D6959 Other secondary thrombocytopenia: Secondary | ICD-10-CM | POA: Diagnosis present

## 2023-12-17 DIAGNOSIS — I4510 Unspecified right bundle-branch block: Secondary | ICD-10-CM | POA: Diagnosis present

## 2023-12-17 DIAGNOSIS — F1090 Alcohol use, unspecified, uncomplicated: Secondary | ICD-10-CM | POA: Diagnosis present

## 2023-12-17 DIAGNOSIS — I82432 Acute embolism and thrombosis of left popliteal vein: Secondary | ICD-10-CM | POA: Diagnosis present

## 2023-12-17 DIAGNOSIS — E785 Hyperlipidemia, unspecified: Secondary | ICD-10-CM | POA: Diagnosis present

## 2023-12-17 DIAGNOSIS — Z1152 Encounter for screening for COVID-19: Secondary | ICD-10-CM | POA: Diagnosis not present

## 2023-12-17 DIAGNOSIS — Z8616 Personal history of COVID-19: Secondary | ICD-10-CM

## 2023-12-17 DIAGNOSIS — I2699 Other pulmonary embolism without acute cor pulmonale: Secondary | ICD-10-CM | POA: Diagnosis not present

## 2023-12-17 DIAGNOSIS — F10188 Alcohol abuse with other alcohol-induced disorder: Secondary | ICD-10-CM | POA: Diagnosis present

## 2023-12-17 DIAGNOSIS — K852 Alcohol induced acute pancreatitis without necrosis or infection: Secondary | ICD-10-CM | POA: Diagnosis present

## 2023-12-17 DIAGNOSIS — D7589 Other specified diseases of blood and blood-forming organs: Secondary | ICD-10-CM | POA: Diagnosis present

## 2023-12-17 DIAGNOSIS — K2970 Gastritis, unspecified, without bleeding: Secondary | ICD-10-CM

## 2023-12-17 DIAGNOSIS — F419 Anxiety disorder, unspecified: Secondary | ICD-10-CM | POA: Diagnosis present

## 2023-12-17 DIAGNOSIS — J449 Chronic obstructive pulmonary disease, unspecified: Secondary | ICD-10-CM | POA: Diagnosis present

## 2023-12-17 DIAGNOSIS — E878 Other disorders of electrolyte and fluid balance, not elsewhere classified: Secondary | ICD-10-CM | POA: Diagnosis present

## 2023-12-17 DIAGNOSIS — I10 Essential (primary) hypertension: Secondary | ICD-10-CM

## 2023-12-17 DIAGNOSIS — F0280 Dementia in other diseases classified elsewhere without behavioral disturbance: Secondary | ICD-10-CM | POA: Diagnosis present

## 2023-12-17 DIAGNOSIS — Z7951 Long term (current) use of inhaled steroids: Secondary | ICD-10-CM

## 2023-12-17 DIAGNOSIS — A419 Sepsis, unspecified organism: Secondary | ICD-10-CM | POA: Diagnosis not present

## 2023-12-17 DIAGNOSIS — Z811 Family history of alcohol abuse and dependence: Secondary | ICD-10-CM

## 2023-12-17 DIAGNOSIS — F17210 Nicotine dependence, cigarettes, uncomplicated: Secondary | ICD-10-CM | POA: Diagnosis present

## 2023-12-17 DIAGNOSIS — F17200 Nicotine dependence, unspecified, uncomplicated: Secondary | ICD-10-CM | POA: Diagnosis present

## 2023-12-17 DIAGNOSIS — I5032 Chronic diastolic (congestive) heart failure: Secondary | ICD-10-CM | POA: Diagnosis present

## 2023-12-17 DIAGNOSIS — F1010 Alcohol abuse, uncomplicated: Secondary | ICD-10-CM | POA: Diagnosis not present

## 2023-12-17 DIAGNOSIS — Z7901 Long term (current) use of anticoagulants: Secondary | ICD-10-CM

## 2023-12-17 DIAGNOSIS — E876 Hypokalemia: Secondary | ICD-10-CM | POA: Diagnosis present

## 2023-12-17 DIAGNOSIS — F1024 Alcohol dependence with alcohol-induced mood disorder: Secondary | ICD-10-CM

## 2023-12-17 DIAGNOSIS — Z833 Family history of diabetes mellitus: Secondary | ICD-10-CM

## 2023-12-17 DIAGNOSIS — I2693 Single subsegmental pulmonary embolism without acute cor pulmonale: Secondary | ICD-10-CM | POA: Diagnosis present

## 2023-12-17 DIAGNOSIS — Z808 Family history of malignant neoplasm of other organs or systems: Secondary | ICD-10-CM

## 2023-12-17 DIAGNOSIS — Z79899 Other long term (current) drug therapy: Secondary | ICD-10-CM

## 2023-12-17 DIAGNOSIS — I82402 Acute embolism and thrombosis of unspecified deep veins of left lower extremity: Secondary | ICD-10-CM | POA: Insufficient documentation

## 2023-12-17 DIAGNOSIS — J690 Pneumonitis due to inhalation of food and vomit: Secondary | ICD-10-CM | POA: Diagnosis not present

## 2023-12-17 DIAGNOSIS — Z800 Family history of malignant neoplasm of digestive organs: Secondary | ICD-10-CM

## 2023-12-17 DIAGNOSIS — I82462 Acute embolism and thrombosis of left calf muscular vein: Secondary | ICD-10-CM | POA: Diagnosis present

## 2023-12-17 DIAGNOSIS — E872 Acidosis, unspecified: Secondary | ICD-10-CM | POA: Diagnosis present

## 2023-12-17 DIAGNOSIS — Z720 Tobacco use: Secondary | ICD-10-CM | POA: Diagnosis present

## 2023-12-17 DIAGNOSIS — K859 Acute pancreatitis without necrosis or infection, unspecified: Secondary | ICD-10-CM | POA: Diagnosis present

## 2023-12-17 LAB — CBC WITH DIFFERENTIAL/PLATELET
Abs Immature Granulocytes: 0.06 K/uL (ref 0.00–0.07)
Basophils Absolute: 0 K/uL (ref 0.0–0.1)
Basophils Relative: 0 %
Eosinophils Absolute: 0 K/uL (ref 0.0–0.5)
Eosinophils Relative: 0 %
HCT: 52.3 % — ABNORMAL HIGH (ref 39.0–52.0)
Hemoglobin: 18.4 g/dL — ABNORMAL HIGH (ref 13.0–17.0)
Immature Granulocytes: 0 %
Lymphocytes Relative: 15 %
Lymphs Abs: 2.1 K/uL (ref 0.7–4.0)
MCH: 36.7 pg — ABNORMAL HIGH (ref 26.0–34.0)
MCHC: 35.2 g/dL (ref 30.0–36.0)
MCV: 104.4 fL — ABNORMAL HIGH (ref 80.0–100.0)
Monocytes Absolute: 1.1 K/uL — ABNORMAL HIGH (ref 0.1–1.0)
Monocytes Relative: 8 %
Neutro Abs: 10.3 K/uL — ABNORMAL HIGH (ref 1.7–7.7)
Neutrophils Relative %: 77 %
Platelets: 188 K/uL (ref 150–400)
RBC: 5.01 MIL/uL (ref 4.22–5.81)
RDW: 15.8 % — ABNORMAL HIGH (ref 11.5–15.5)
WBC: 13.5 K/uL — ABNORMAL HIGH (ref 4.0–10.5)
nRBC: 0 % (ref 0.0–0.2)

## 2023-12-17 LAB — COMPREHENSIVE METABOLIC PANEL WITH GFR
ALT: 18 U/L (ref 0–44)
AST: 35 U/L (ref 15–41)
Albumin: 4.5 g/dL (ref 3.5–5.0)
Alkaline Phosphatase: 110 U/L (ref 38–126)
Anion gap: 18 — ABNORMAL HIGH (ref 5–15)
BUN: 14 mg/dL (ref 6–20)
CO2: 31 mmol/L (ref 22–32)
Calcium: 10.3 mg/dL (ref 8.9–10.3)
Chloride: 95 mmol/L — ABNORMAL LOW (ref 98–111)
Creatinine, Ser: 0.86 mg/dL (ref 0.61–1.24)
GFR, Estimated: >60 mL/min (ref >60)
Glucose, Bld: 130 mg/dL — ABNORMAL HIGH (ref 70–99)
Potassium: 2.8 mmol/L — ABNORMAL LOW (ref 3.5–5.1)
Sodium: 144 mmol/L (ref 135–145)
Total Bilirubin: 1.3 mg/dL — ABNORMAL HIGH (ref 0.0–1.2)
Total Protein: 7.9 g/dL (ref 6.5–8.1)

## 2023-12-17 LAB — LIPASE, BLOOD: Lipase: 383 U/L — ABNORMAL HIGH (ref 11–51)

## 2023-12-17 LAB — LACTIC ACID, PLASMA: Lactic Acid, Venous: 3.6 mmol/L (ref 0.5–1.9)

## 2023-12-17 LAB — TROPONIN I (HIGH SENSITIVITY): Troponin I (High Sensitivity): 6 ng/L (ref <18)

## 2023-12-17 LAB — BRAIN NATRIURETIC PEPTIDE: B Natriuretic Peptide: 23.6 pg/mL (ref 0.0–100.0)

## 2023-12-17 MED ORDER — FENTANYL CITRATE PF 50 MCG/ML IJ SOSY
50.0000 ug | PREFILLED_SYRINGE | Freq: Once | INTRAMUSCULAR | Status: AC
  Administered 2023-12-17: 50 ug via INTRAVENOUS
  Filled 2023-12-17: qty 1

## 2023-12-17 MED ORDER — SODIUM CHLORIDE 0.9 % IV SOLN
2.0000 g | Freq: Once | INTRAVENOUS | Status: AC
  Administered 2023-12-18: 2 g via INTRAVENOUS
  Filled 2023-12-17: qty 20

## 2023-12-17 MED ORDER — ONDANSETRON 4 MG PO TBDP: 4.0000 mg | ORAL_TABLET | Freq: Three times a day (TID) | ORAL | 0 refills | Status: AC | PRN

## 2023-12-17 MED ORDER — METRONIDAZOLE 500 MG/100ML IV SOLN
500.0000 mg | Freq: Once | INTRAVENOUS | Status: AC
  Administered 2023-12-18: 500 mg via INTRAVENOUS
  Filled 2023-12-17: qty 100

## 2023-12-17 MED ORDER — ONDANSETRON HCL 4 MG/2ML IJ SOLN
4.0000 mg | Freq: Once | INTRAMUSCULAR | Status: AC
  Administered 2023-12-17: 4 mg via INTRAVENOUS
  Filled 2023-12-17: qty 2

## 2023-12-17 MED ORDER — IOHEXOL 350 MG/ML SOLN
100.0000 mL | Freq: Once | INTRAVENOUS | Status: AC | PRN
Start: 2023-12-17 — End: 2023-12-17
  Administered 2023-12-17: 100 mL via INTRAVENOUS

## 2023-12-17 MED ORDER — LACTATED RINGERS IV BOLUS (SEPSIS)
1000.0000 mL | Freq: Once | INTRAVENOUS | Status: AC
  Administered 2023-12-18: 1000 mL via INTRAVENOUS

## 2023-12-17 NOTE — ED Triage Notes (Addendum)
 Pt arrived to ED via [facility] for SOB, not feeling well, vomiting the past 2 days. Dimished in right lower lobes. EMS gave 1 albuterol  tx. 98%RA. BGL 210. EMS gave 4mg  zofran  for the nausea. EKG unremarkable with EMS. EMS states the EKG had a lot of artifact, although unremarkable.    Chest pain 7/10 . States something feels heavy on his chest. Pt A&Ox4.

## 2023-12-17 NOTE — ED Provider Notes (Addendum)
 [facility] Provider Note    Event Date/Time   First MD Initiated Contact with Patient 12/17/23 2155     (approximate)   History   Shortness of Breath and Chest Pain   HPI  Darrell Santiago is a 58 y.o. male with a history of COPD, HFpEF, hypertension, hyperlipidemia, TIA, alcohol use disorder, anxiety, depression who presents with chest pain, acute onset this evening, severe intensity, sharp pain, mainly in left side of his chest and radiating to his upper abdomen and back.  He reports associated shortness of breath and has had vomiting for the last 2 days.  He reports feeling weak.  He states that he has had some alcohol tonight to help ease the pain.  Reviewed the past medical records.  The patient was most recent admitted to the hospitalist service in March with an episode of chest pain and alcohol intoxication.   Physical Exam   Triage Vital Signs: ED Triage Vitals  Encounter Vitals Group     BP      Girls Systolic BP Percentile      Girls Diastolic BP Percentile      Boys Systolic BP Percentile      Boys Diastolic BP Percentile      Pulse      Resp      Temp      Temp src      SpO2      Weight      Height      Head Circumference      Peak Flow      Pain Score      Pain Loc      Pain Education      Exclude from Growth Chart     Most recent vital signs: Vitals:   12/17/23 2159  BP: 101/76  Pulse: (!) 109  Resp: 18  Temp: 99.1 F (37.3 C)  SpO2: 99%     General: Alert, uncomfortable appearing, no acute distress.  CV:  Good peripheral perfusion.  Normal heart sounds. Resp:  Normal effort.  Lungs CTAB. Abd:  Soft with mild epigastric tenderness.  No distention.  Other:  No peripheral edema.  No jaundice or scleral icterus.  Somewhat dry mucous membranes.   ED Results / Procedures / Treatments   Labs (all labs ordered are listed, but only abnormal results are displayed) Labs Reviewed  COMPREHENSIVE METABOLIC PANEL WITH GFR -  Abnormal; Notable for the following components:      Result Value   Potassium 2.8 (*)    Chloride 95 (*)    Glucose, Bld 130 (*)    Total Bilirubin 1.3 (*)    Anion gap 18 (*)    All other components within normal limits  CBC WITH DIFFERENTIAL/PLATELET - Abnormal; Notable for the following components:   WBC 13.5 (*)    Hemoglobin 18.4 (*)    HCT 52.3 (*)    MCV 104.4 (*)    MCH 36.7 (*)    RDW 15.8 (*)    Neutro Abs 10.3 (*)    Monocytes Absolute 1.1 (*)    All other components within normal limits  LACTIC ACID, PLASMA - Abnormal; Notable for the following components:   Lactic Acid, Venous 3.6 (*)    All other components within normal limits  LIPASE, BLOOD - Abnormal; Notable for the following components:   Lipase 383 (*)    All other components within normal limits  RESP PANEL BY RT-PCR (RSV, FLU  A&B, COVID)  RVPGX2  BRAIN NATRIURETIC PEPTIDE  LACTIC ACID, PLASMA  URINALYSIS, ROUTINE W REFLEX MICROSCOPIC  TROPONIN I (HIGH SENSITIVITY)  TROPONIN I (HIGH SENSITIVITY)     EKG  ED ECG REPORT I, Waylon Cassis, the attending physician, personally viewed and interpreted this ECG.  Date: 12/17/2023 EKG Time: 2157 Rate: 107 Rhythm: Sinus tachycardia QRS Axis: normal Intervals: RBBB ST/T Wave abnormalities: Nonspecific ST abnormalities Narrative Interpretation: no evidence of acute ischemia (EKG read by machine is acute MI, however does not meet STEMI criteria and there are no significant changes when compared to EKG of 11/10/2023)    RADIOLOGY  Chest x-ray: I independently viewed and interpreted the images; there is no focal consolidation or edema  CT angio chest/abdomen/pelvis:   IMPRESSION:  1. No evidence for aortic dissection or aneurysm.  2. Findings compatible with acute pancreatitis. No focal fluid  collection.  3. Wall thickening and inflammation of the fourth portion of the  duodenum is likely reactive.  4. Minimal tree-in-bud opacities in the left  lower lobe may be  infectious/inflammatory.  5. Aortic atherosclerosis.    Aortic Atherosclerosis (ICD10-I70.0).      Electronically Signed    PROCEDURES:  Critical Care performed: Yes  .Critical Care  Performed by: Cassis Waylon, MD Authorized by: Cassis Waylon, MD   Critical care provider statement:    Critical care time (minutes):  30   Critical care time was exclusive of:  Separately billable procedures and treating other patients   Critical care was necessary to treat or prevent imminent or life-threatening deterioration of the following conditions:  Sepsis (Acute pancreatitis)   Critical care was time spent personally by me on the following activities:  Development of treatment plan with patient or surrogate, discussions with consultants, evaluation of patient's response to treatment, examination of patient, ordering and review of laboratory studies, ordering and review of radiographic studies, ordering and performing treatments and interventions, pulse oximetry, re-evaluation of patient's condition, review of old charts and obtaining history from patient or surrogate   Care discussed with: admitting provider      MEDICATIONS ORDERED IN ED: Medications  lactated ringers  bolus 1,000 mL (has no administration in time range)  cefTRIAXone  (ROCEPHIN ) 2 g in sodium chloride  0.9 % 100 mL IVPB (has no administration in time range)  metroNIDAZOLE  (FLAGYL ) IVPB 500 mg (has no administration in time range)  fentaNYL  (SUBLIMAZE ) injection 50 mcg (50 mcg Intravenous Given 12/17/23 2224)  ondansetron  (ZOFRAN ) injection 4 mg (4 mg Intravenous Given 12/17/23 2224)  iohexol  (OMNIPAQUE ) 350 MG/ML injection 100 mL (100 mLs Intravenous Contrast Given 12/17/23 2246)     IMPRESSION / MDM / ASSESSMENT AND PLAN / ED COURSE  I reviewed the triage vital signs and the nursing notes.  58 year old male with PMH as noted above presents with acute onset of severe chest pain along with nausea  and vomiting for the last couple of days.  On exam he is uncomfortable and unwell appearing.  He is tachycardic with a low-grade temperature.  O2 saturation is in the high 90s on room air.  He has some mild epigastric tenderness.  Differential diagnosis includes, but is not limited to, ACS, musculoskeletal pain, GERD, gastroenteritis, pancreatitis, other hepatobiliary etiology, less likely PE, aortic dissection or other vascular cause.  EKG was read by machine as acute MI, however it does not meet STEMI criteria and the morphology is very similar to prior EKGs.  We will obtain chest x-ray, lab workup, and likely CT angio of the  chest/abdomen/pelvis to rule out dissection given the sharp pain radiating to the back and abdomen.  Patient's presentation is most consistent with acute presentation with potential threat to life or bodily function.  The patient is on the cardiac monitor to evaluate for evidence of arrhythmia and/or significant heart rate changes.  ----------------------------------------- 11:34 PM on 12/17/2023 -----------------------------------------  CT is negative for dissection.  It shows acute pancreatitis which is confirmed by the labs.  Lab workup is significant for lipase of 383.  WBC count is 13.5, and initial lactate is 3.6.  Troponin and BNP are negative.  The patient remains hemodynamically stable and his pain is improved.  I have ordered fluids and antibiotics.  I consulted Dr. Lawence from the hospitalist service; based on our discussion he agrees to evaluate the patient for admission.   FINAL CLINICAL IMPRESSION(S) / ED DIAGNOSES   Final diagnoses:  Alcohol-induced acute pancreatitis without infection or necrosis     Rx / DC Orders   ED Discharge Orders     None        Note:  This document was prepared using Dragon voice recognition software and may include unintentional dictation errors.    Jacolyn Pae, MD 12/17/23 [id number]    Jacolyn Pae, MD 12/17/23 [phone]

## 2023-12-17 NOTE — Progress Notes (Signed)
E-Visit for Nausea and Vomiting   We are sorry that you are not feeling well. Here is how we plan to help!  Based on what you have shared with me it looks like you have a Virus that is irritating your GI tract.  Vomiting is the forceful emptying of a portion of the stomach's content through the mouth.  Although nausea and vomiting can make you feel miserable, it's important to remember that these are not diseases, but rather symptoms of an underlying illness.  When we treat short term symptoms, we always caution that any symptoms that persist should be fully evaluated in a medical office.  I have prescribed a medication that will help alleviate your symptoms and allow you to stay hydrated:  Zofran 4 mg 1 tablet every 8 hours as needed for nausea and vomiting  HOME CARE: Drink clear liquids.  This is very important! Dehydration (the lack of fluid) can lead to a serious complication.  Start off with 1 tablespoon every 5 minutes for 8 hours. You may begin eating bland foods after 8 hours without vomiting.  Start with saltine crackers, white bread, rice, mashed potatoes, applesauce. After 48 hours on a bland diet, you may resume a normal diet. Try to go to sleep.  Sleep often empties the stomach and relieves the need to vomit.  GET HELP RIGHT AWAY IF:  Your symptoms do not improve or worsen within 2 days after treatment. You have a fever for over 3 days. You cannot keep down fluids after trying the medication.  MAKE SURE YOU:  Understand these instructions. Will watch your condition. Will get help right away if you are not doing well or get worse.    Thank you for choosing an e-visit.  Your e-visit answers were reviewed by a board certified advanced clinical practitioner to complete your personal care plan. Depending upon the condition, your plan could have included both over the counter or prescription medications.  Please review your pharmacy choice. Make sure the pharmacy is open so  you can pick up prescription now. If there is a problem, you may contact your provider through MyChart messaging and have the prescription routed to another pharmacy.  Your safety is important to us. If you have drug allergies check your prescription carefully.   For the next 24 hours you can use MyChart to ask questions about today's visit, request a non-urgent call back, or ask for a work or school excuse. You will get an email in the next two days asking about your experience. I hope that your e-visit has been valuable and will speed your recovery.  Approximately 5 minutes was spent documenting and reviewing patient's chart.    

## 2023-12-18 ENCOUNTER — Encounter: Payer: Self-pay | Admitting: Family Medicine

## 2023-12-18 ENCOUNTER — Other Ambulatory Visit: Payer: Self-pay

## 2023-12-18 DIAGNOSIS — F1010 Alcohol abuse, uncomplicated: Secondary | ICD-10-CM

## 2023-12-18 DIAGNOSIS — A419 Sepsis, unspecified organism: Secondary | ICD-10-CM

## 2023-12-18 DIAGNOSIS — K852 Alcohol induced acute pancreatitis without necrosis or infection: Secondary | ICD-10-CM

## 2023-12-18 DIAGNOSIS — J690 Pneumonitis due to inhalation of food and vomit: Secondary | ICD-10-CM | POA: Diagnosis not present

## 2023-12-18 DIAGNOSIS — F32A Depression, unspecified: Secondary | ICD-10-CM | POA: Insufficient documentation

## 2023-12-18 LAB — HIV ANTIBODY (ROUTINE TESTING W REFLEX): HIV Screen 4th Generation wRfx: NONREACTIVE

## 2023-12-18 LAB — COMPREHENSIVE METABOLIC PANEL WITH GFR
ALT: 13 U/L (ref 0–44)
AST: 27 U/L (ref 15–41)
Albumin: 3.8 g/dL (ref 3.5–5.0)
Alkaline Phosphatase: 96 U/L (ref 38–126)
Anion gap: 18 — ABNORMAL HIGH (ref 5–15)
BUN: 14 mg/dL (ref 6–20)
CO2: 28 mmol/L (ref 22–32)
Calcium: 9.6 mg/dL (ref 8.9–10.3)
Chloride: 99 mmol/L (ref 98–111)
Creatinine, Ser: 0.54 mg/dL — ABNORMAL LOW (ref 0.61–1.24)
GFR, Estimated: >60 mL/min (ref >60)
Glucose, Bld: 111 mg/dL — ABNORMAL HIGH (ref 70–99)
Potassium: 2.5 mmol/L — CL (ref 3.5–5.1)
Sodium: 145 mmol/L (ref 135–145)
Total Bilirubin: 1.4 mg/dL — ABNORMAL HIGH (ref 0.0–1.2)
Total Protein: 7 g/dL (ref 6.5–8.1)

## 2023-12-18 LAB — URINALYSIS, ROUTINE W REFLEX MICROSCOPIC
Bacteria, UA: NONE SEEN
Bilirubin Urine: NEGATIVE
Glucose, UA: NEGATIVE mg/dL
Hgb urine dipstick: NEGATIVE
Ketones, ur: NEGATIVE mg/dL
Leukocytes,Ua: NEGATIVE
Nitrite: NEGATIVE
Protein, ur: 100 mg/dL — AB
Specific Gravity, Urine: >1.046 — ABNORMAL HIGH (ref 1.005–1.030)
pH: 6 (ref 5.0–8.0)

## 2023-12-18 LAB — MAGNESIUM
Magnesium: 1.4 mg/dL — ABNORMAL LOW (ref 1.7–2.4)
Magnesium: 1.7 mg/dL (ref 1.7–2.4)

## 2023-12-18 LAB — CBC
HCT: 45.4 % (ref 39.0–52.0)
Hemoglobin: 15.9 g/dL (ref 13.0–17.0)
MCH: 36.1 pg — ABNORMAL HIGH (ref 26.0–34.0)
MCHC: 35 g/dL (ref 30.0–36.0)
MCV: 103.2 fL — ABNORMAL HIGH (ref 80.0–100.0)
Platelets: 162 K/uL (ref 150–400)
RBC: 4.4 MIL/uL (ref 4.22–5.81)
RDW: 15.7 % — ABNORMAL HIGH (ref 11.5–15.5)
WBC: 11.2 K/uL — ABNORMAL HIGH (ref 4.0–10.5)
nRBC: 0 % (ref 0.0–0.2)

## 2023-12-18 LAB — RESP PANEL BY RT-PCR (RSV, FLU A&B, COVID)  RVPGX2
Influenza A by PCR: NEGATIVE
Influenza B by PCR: NEGATIVE
Resp Syncytial Virus by PCR: NEGATIVE
SARS Coronavirus 2 by RT PCR: NEGATIVE

## 2023-12-18 LAB — TROPONIN I (HIGH SENSITIVITY): Troponin I (High Sensitivity): 7 ng/L (ref <18)

## 2023-12-18 LAB — LIPASE, BLOOD: Lipase: 208 U/L — ABNORMAL HIGH (ref 11–51)

## 2023-12-18 LAB — LACTIC ACID, PLASMA: Lactic Acid, Venous: 3.9 mmol/L (ref 0.5–1.9)

## 2023-12-18 MED ORDER — FUROSEMIDE 40 MG PO TABS: 20.0000 mg | ORAL_TABLET | Freq: Every day | ORAL | Status: DC

## 2023-12-18 MED ORDER — MAGNESIUM SULFATE 2 GM/50ML IV SOLN
2.0000 g | Freq: Once | INTRAVENOUS | Status: AC
  Administered 2023-12-18: 2 g via INTRAVENOUS
  Filled 2023-12-18: qty 50

## 2023-12-18 MED ORDER — ADULT MULTIVITAMIN W/MINERALS CH
1.0000 | ORAL_TABLET | Freq: Every day | ORAL | Status: DC
  Administered 2023-12-18 – 2023-12-21 (×4): 1 via ORAL
  Filled 2023-12-18 (×4): qty 1

## 2023-12-18 MED ORDER — POTASSIUM CHLORIDE 20 MEQ PO PACK
40.0000 meq | PACK | Freq: Once | ORAL | Status: AC
  Administered 2023-12-18: 40 meq via ORAL
  Filled 2023-12-18: qty 2

## 2023-12-18 MED ORDER — CHLORDIAZEPOXIDE HCL 25 MG PO CAPS
25.0000 mg | ORAL_CAPSULE | Freq: Four times a day (QID) | ORAL | Status: DC
  Administered 2023-12-18 – 2023-12-19 (×7): 25 mg via ORAL
  Filled 2023-12-18 (×7): qty 1

## 2023-12-18 MED ORDER — ONDANSETRON HCL 4 MG/2ML IJ SOLN
4.0000 mg | INTRAMUSCULAR | Status: DC | PRN
  Administered 2023-12-18: 4 mg via INTRAVENOUS
  Filled 2023-12-18: qty 2

## 2023-12-18 MED ORDER — THIAMINE HCL 100 MG/ML IJ SOLN
100.0000 mg | Freq: Every day | INTRAMUSCULAR | Status: DC
  Administered 2023-12-20: 100 mg via INTRAVENOUS
  Filled 2023-12-18: qty 2

## 2023-12-18 MED ORDER — FOLIC ACID 1 MG PO TABS
1.0000 mg | ORAL_TABLET | Freq: Every day | ORAL | Status: DC
  Administered 2023-12-18 – 2023-12-21 (×4): 1 mg via ORAL
  Filled 2023-12-18 (×4): qty 1

## 2023-12-18 MED ORDER — THIAMINE MONONITRATE 100 MG PO TABS
100.0000 mg | ORAL_TABLET | Freq: Every day | ORAL | Status: DC
  Administered 2023-12-18 – 2023-12-21 (×3): 100 mg via ORAL
  Filled 2023-12-18 (×4): qty 1

## 2023-12-18 MED ORDER — MORPHINE SULFATE (PF) 2 MG/ML IV SOLN
2.0000 mg | INTRAVENOUS | Status: DC | PRN
  Administered 2023-12-18 – 2023-12-19 (×2): 2 mg via INTRAVENOUS
  Filled 2023-12-18 (×2): qty 1

## 2023-12-18 MED ORDER — POTASSIUM CHLORIDE CRYS ER 20 MEQ PO TBCR
40.0000 meq | EXTENDED_RELEASE_TABLET | Freq: Once | ORAL | Status: AC
  Administered 2023-12-18: 40 meq via ORAL
  Filled 2023-12-18: qty 2

## 2023-12-18 MED ORDER — POTASSIUM CHLORIDE CRYS ER 20 MEQ PO TBCR
20.0000 meq | EXTENDED_RELEASE_TABLET | Freq: Two times a day (BID) | ORAL | Status: DC
  Administered 2023-12-18: 20 meq via ORAL
  Filled 2023-12-18: qty 1

## 2023-12-18 MED ORDER — IPRATROPIUM-ALBUTEROL 0.5-2.5 (3) MG/3ML IN SOLN: 3.0000 mL | Freq: Four times a day (QID) | RESPIRATORY_TRACT | Status: DC | PRN

## 2023-12-18 MED ORDER — POTASSIUM CHLORIDE IN NACL 40-0.9 MEQ/L-% IV SOLN
INTRAVENOUS | Status: DC
  Filled 2023-12-18 (×2): qty 1000

## 2023-12-18 MED ORDER — NICOTINE 21 MG/24HR TD PT24
21.0000 mg | MEDICATED_PATCH | Freq: Every day | TRANSDERMAL | Status: DC
  Administered 2023-12-18 – 2023-12-21 (×4): 21 mg via TRANSDERMAL
  Filled 2023-12-18 (×4): qty 1

## 2023-12-18 MED ORDER — TRAZODONE HCL 50 MG PO TABS
25.0000 mg | ORAL_TABLET | Freq: Every evening | ORAL | Status: DC | PRN
  Administered 2023-12-20: 25 mg via ORAL
  Filled 2023-12-18: qty 1

## 2023-12-18 MED ORDER — KETOROLAC TROMETHAMINE 15 MG/ML IJ SOLN
15.0000 mg | Freq: Four times a day (QID) | INTRAMUSCULAR | Status: DC | PRN
  Administered 2023-12-18 – 2023-12-19 (×3): 15 mg via INTRAVENOUS
  Filled 2023-12-18 (×3): qty 1

## 2023-12-18 MED ORDER — LORAZEPAM 1 MG PO TABS
1.0000 mg | ORAL_TABLET | ORAL | Status: AC | PRN
  Administered 2023-12-20 (×3): 2 mg via ORAL
  Administered 2023-12-20: 3 mg via ORAL
  Filled 2023-12-18 (×3): qty 2
  Filled 2023-12-18: qty 4

## 2023-12-18 MED ORDER — ACETAMINOPHEN 325 MG PO TABS: 650.0000 mg | ORAL_TABLET | Freq: Four times a day (QID) | ORAL | Status: DC | PRN

## 2023-12-18 MED ORDER — LORAZEPAM 2 MG/ML IJ SOLN: 1.0000 mg | INTRAMUSCULAR | Status: AC | PRN

## 2023-12-18 MED ORDER — ENOXAPARIN SODIUM 40 MG/0.4ML IJ SOSY
40.0000 mg | PREFILLED_SYRINGE | Freq: Every day | INTRAMUSCULAR | Status: DC
  Administered 2023-12-18 (×2): 40 mg via SUBCUTANEOUS
  Filled 2023-12-18 (×2): qty 0.4

## 2023-12-18 MED ORDER — SODIUM CHLORIDE 0.9 % IV SOLN
2.0000 g | INTRAVENOUS | Status: DC
  Administered 2023-12-19 – 2023-12-20 (×2): 2 g via INTRAVENOUS
  Filled 2023-12-18 (×2): qty 20

## 2023-12-18 MED ORDER — BENZONATATE 100 MG PO CAPS: 100.0000 mg | ORAL_CAPSULE | Freq: Three times a day (TID) | ORAL | Status: DC | PRN

## 2023-12-18 MED ORDER — HYDROXYZINE HCL 25 MG PO TABS
25.0000 mg | ORAL_TABLET | Freq: Every day | ORAL | Status: DC | PRN
  Administered 2023-12-18 – 2023-12-20 (×3): 25 mg via ORAL
  Filled 2023-12-18 (×4): qty 1

## 2023-12-18 MED ORDER — ACETAMINOPHEN 650 MG RE SUPP: 650.0000 mg | Freq: Four times a day (QID) | RECTAL | Status: DC | PRN

## 2023-12-18 MED ORDER — MAGNESIUM HYDROXIDE 400 MG/5ML PO SUSP
30.0000 mL | Freq: Every day | ORAL | Status: DC | PRN
  Administered 2023-12-18 – 2023-12-19 (×2): 30 mL via ORAL
  Filled 2023-12-18 (×2): qty 30

## 2023-12-18 MED ORDER — METRONIDAZOLE 500 MG/100ML IV SOLN
500.0000 mg | Freq: Two times a day (BID) | INTRAVENOUS | Status: DC
  Administered 2023-12-18 – 2023-12-20 (×5): 500 mg via INTRAVENOUS
  Filled 2023-12-18 (×5): qty 100

## 2023-12-18 MED ORDER — POTASSIUM CHLORIDE IN NACL 20-0.9 MEQ/L-% IV SOLN
INTRAVENOUS | Status: DC
  Filled 2023-12-18 (×2): qty 1000

## 2023-12-18 MED ORDER — SERTRALINE HCL 50 MG PO TABS
50.0000 mg | ORAL_TABLET | Freq: Every day | ORAL | Status: DC
Start: 2023-12-18 — End: 2023-12-21
  Administered 2023-12-18 – 2023-12-21 (×4): 50 mg via ORAL
  Filled 2023-12-18 (×4): qty 1

## 2023-12-18 NOTE — Assessment & Plan Note (Signed)
-   The patient will be admitted to a cardiac telemetry bed. - He will be kept n.p.o. except for medications. - Will follow serial lipase levels. - Pain management will be provided. - This is likely alcoholic pancreatitis. - We will follow LFTs.

## 2023-12-18 NOTE — Assessment & Plan Note (Addendum)
-   This is likely secondary to #1 and could be related to subsequent early left lower lobe aspiration pneumonia due to recurrent nausea and vomiting. - This could also be related to acute lower UTI - The patient will be placed on IV Rocephin  and Flagyl . - Will continue hydration with IV normal saline. - Will follow blood and urine cultures.

## 2023-12-18 NOTE — Assessment & Plan Note (Signed)
I counseled him for smoking cessation and he will receive further counseling here. 

## 2023-12-18 NOTE — ED Notes (Signed)
 Pt's pocket knife was found by another nurse and was given to this paramedic. A pt's label was placed on the knife and the knife was placed in a plastic bag and given to security at this time.

## 2023-12-18 NOTE — ED Notes (Signed)
 Pt was advised that more lab work had to be done and he needed to have stuck for blood cultures. PT stated he did not want to be stuck again.

## 2023-12-18 NOTE — ED Notes (Signed)
 Pt sleeping and O2 dropped down into the high 80s. Pt was placed on 2L [place] and O2 improved to 98% Pt's main RN notified.

## 2023-12-18 NOTE — Plan of Care (Signed)
  Problem: Clinical Measurements: Goal: Ability to maintain clinical measurements within normal limits will improve Outcome: Progressing   Problem: Clinical Measurements: Goal: Respiratory complications will improve Outcome: Progressing   Problem: Clinical Measurements: Goal: Cardiovascular complication will be avoided Outcome: Progressing   Problem: Nutrition: Goal: Adequate nutrition will be maintained Outcome: Progressing   Problem: Pain Managment: Goal: General experience of comfort will improve and/or be controlled Outcome: Progressing   Problem: Safety: Goal: Ability to remain free from injury will improve Outcome: Progressing

## 2023-12-18 NOTE — ED Notes (Signed)
 Lab contacted to add on magnesium  to blood work.

## 2023-12-18 NOTE — Progress Notes (Addendum)
 Progress Note    Darrell Santiago  FMW:[id number] DOB: 27-May-1965  DOA: 12/17/2023 PCP: Pcp, No      Brief Narrative:    Medical records reviewed and are as summarized below:  Darrell Santiago is a 58 y.o. male with medical history significant for anxiety, depression, alcohol use disorder, tobacco use disorder, GERD, hypertension, dyslipidemia, seasonal allergies, who presented to the hospital with acute onset of abdominal pain that radiated to his back.  Pain was associated with recurrent nausea and vomiting.  He said he drinks about 2 shots of liquor a day.  He was found to have acute appendicitis, likely alcohol induced.      Assessment/Plan:   Principal Problem:   Acute pancreatitis Active Problems:   Sepsis due to undetermined organism (HCC)   Tobacco abuse   Alcohol use disorder   Depression    Body mass index is 23.27 kg/m.   Alcohol-induced acute pancreatitis: Start clear liquid diet.  Analgesics as needed for pain. Counseled to quit drinking alcohol. Lipase down from 383-208.   Worsening hypokalemia: Potassium down from 2.8-2.5.  Continue potassium repletion (intravenously and orally).   Hypomagnesemia: Improved   Left lower lobe opacities: Unable to rule out pneumonia.  Continue empiric IV antibiotics.   Lactic acidosis: Unable to rule out severe sepsis.  Follow-up blood cultures Abnormal urinalysis: Follow-up urine culture report Continue empiric IV antibiotics.   Alcohol use disorder: Librium  has been prescribed.  Use IV Ativan  as needed per CIWA protocol.  Continue thiamine  and multivitamins Tobacco use disorder: Nicotine  patch has been prescribed.  Counseled to quit smoking cigarettes.   Comorbidities include anxiety, depression, hyperlipidemia, hypertension    Diet Order             Diet NPO time specified Except for: Sips with Meds  Diet effective now                                   Consultants: None  Procedures: None    Medications:    enoxaparin  (LOVENOX ) injection  40 mg Subcutaneous QHS   folic acid   1 mg Oral Daily   multivitamin with minerals  1 tablet Oral Daily   nicotine   21 mg Transdermal Daily   sertraline   50 mg Oral Daily   thiamine   100 mg Oral Daily   Or   thiamine   100 mg Intravenous Daily   Continuous Infusions:  0.9 % NaCl with KCl 40 mEq / L 75 mL/hr at 12/18/23 0949   [START ON 12/19/2023] cefTRIAXone  (ROCEPHIN )  IV     metronidazole  500 mg (12/18/23 0958)     Anti-infectives (From admission, onward)    Start     Dose/Rate Route Frequency Ordered Stop   12/19/23 0400  cefTRIAXone  (ROCEPHIN ) 2 g in sodium chloride  0.9 % 100 mL IVPB        2 g 200 mL/hr over 30 Minutes Intravenous Every 24 hours 12/18/23 0348     12/18/23 1000  metroNIDAZOLE  (FLAGYL ) IVPB 500 mg        500 mg 100 mL/hr over 60 Minutes Intravenous Every 12 hours 12/18/23 0348     12/17/23 2345  cefTRIAXone  (ROCEPHIN ) 2 g in sodium chloride  0.9 % 100 mL IVPB        2 g 200 mL/hr over 30 Minutes Intravenous Once 12/17/23 2335 12/18/23 0156   12/17/23 2345  metroNIDAZOLE  (FLAGYL ) IVPB 500  mg        500 mg 100 mL/hr over 60 Minutes Intravenous  Once 12/17/23 2335 12/18/23 0124              Family Communication/Anticipated D/C date and plan/Code Status   DVT prophylaxis: enoxaparin  (LOVENOX ) injection 40 mg Start: 12/18/23 0130     Code Status: Full Code  Family Communication: Discussed with his wife at the bedside. Disposition Plan: Plan to discharge home   Status is: Inpatient Remains inpatient appropriate because: Acute pancreatitis       Subjective:   Interval events noted.  He complains of abdominal pain but pain is overall better.  No nausea or vomiting today.  His wife was at the bedside.  Objective:    Vitals:   12/18/23 0138 12/18/23 0155 12/18/23 0504 12/18/23 0817  BP:  (!) 142/83 119/87 (!)  139/92  Pulse:  96 (!) 116 97  Resp:  17 20 19   Temp:  98.4 F (36.9 C) 98.2 F (36.8 C) 97.9 F (36.6 C)  TempSrc:  Oral Oral Oral  SpO2:  98% 99% 97%  Weight: 90.7 kg  72.5 kg   Height: 5' 9.5 (1.765 m)      No data found.   Intake/Output Summary (Last 24 hours) at 12/18/2023 1111 Last data filed at 12/18/2023 0156 Gross per 24 hour  Intake 1200 ml  Output 250 ml  Net 950 ml   Filed Weights   12/18/23 0138 12/18/23 0504  Weight: 90.7 kg 72.5 kg    Exam:  GEN: NAD SKIN: Warm and dry EYES: EOMI ENT: MMM CV: RRR PULM: CTA B ABD: soft, ND, mid abdominal tenderness, no rebound tenderness or guarding, +BS CNS: AAO x 3, non focal, fine tremors of bilateral hands EXT: No edema or tenderness        Data Reviewed:   I have personally reviewed following labs and imaging studies:  Labs: Labs show the following:   Basic Metabolic Panel: Recent Labs  Lab 12/17/23 2203 12/18/23 0006 12/18/23 0601  NA 144  --  145  K 2.8*  --  2.5*  CL 95*  --  99  CO2 31  --  28  GLUCOSE 130*  --  111*  BUN 14  --  14  CREATININE 0.86  --  0.54*  CALCIUM  10.3  --  9.6  MG  --  1.4* 1.7   GFR Estimated Creatinine Clearance: 104.9 mL/min (A) (by C-G formula based on SCr of 0.54 mg/dL (L)). Liver Function Tests: Recent Labs  Lab 12/17/23 2203 12/18/23 0601  AST 35 27  ALT 18 13  ALKPHOS 110 96  BILITOT 1.3* 1.4*  PROT 7.9 7.0  ALBUMIN 4.5 3.8   Recent Labs  Lab 12/17/23 2203 12/18/23 0601  LIPASE 383* 208*   No results for input(s): AMMONIA in the last 168 hours. Coagulation profile No results for input(s): INR, PROTIME in the last 168 hours.  CBC: Recent Labs  Lab 12/17/23 2203 12/18/23 0601  WBC 13.5* 11.2*  NEUTROABS 10.3*  --   HGB 18.4* 15.9  HCT 52.3* 45.4  MCV 104.4* 103.2*  PLT 188 162   Cardiac Enzymes: No results for input(s): CKTOTAL, CKMB, CKMBINDEX, TROPONINI in the last 168 hours. BNP (last 3 results) No results for  input(s): PROBNP in the last 8760 hours. CBG: No results for input(s): GLUCAP in the last 168 hours. D-Dimer: No results for input(s): DDIMER in the last 72 hours. Hgb A1c: No results for  input(s): HGBA1C in the last 72 hours. Lipid Profile: No results for input(s): CHOL, HDL, LDLCALC, TRIG, CHOLHDL, LDLDIRECT in the last 72 hours. Thyroid  function studies: No results for input(s): TSH, T4TOTAL, T3FREE, THYROIDAB in the last 72 hours.  Invalid input(s): FREET3 Anemia work up: No results for input(s): VITAMINB12, FOLATE, FERRITIN, TIBC, IRON, RETICCTPCT in the last 72 hours. Sepsis Labs: Recent Labs  Lab 12/17/23 2203 12/18/23 0006 12/18/23 0601  WBC 13.5*  --  11.2*  LATICACIDVEN 3.6* 3.9*  --     Microbiology Recent Results (from the past 240 hours)  Resp panel by RT-PCR (RSV, Flu A&B, Covid) Anterior Nasal Swab     Status: None   Collection Time: 12/18/23  2:00 AM   Specimen: Anterior Nasal Swab  Result Value Ref Range Status   SARS Coronavirus 2 by RT PCR NEGATIVE NEGATIVE Final    Comment: (NOTE) SARS-CoV-2 target nucleic acids are NOT DETECTED.  The SARS-CoV-2 RNA is generally detectable in upper respiratory specimens during the acute phase of infection. The lowest concentration of SARS-CoV-2 viral copies this assay can detect is 138 copies/mL. A negative result does not preclude SARS-Cov-2 infection and should not be used as the sole basis for treatment or other patient management decisions. A negative result may occur with  improper specimen collection/handling, submission of specimen other than nasopharyngeal swab, presence of viral mutation(s) within the areas targeted by this assay, and inadequate number of viral copies(<138 copies/mL). A negative result must be combined with clinical observations, patient history, and epidemiological information. The expected result is Negative.  Fact Sheet for Patients:   BloggerCourse.com  Fact Sheet for Healthcare Providers:  SeriousBroker.it  This test is no t yet approved or cleared by the [place]  FDA and  has been authorized for detection and/or diagnosis of SARS-CoV-2 by FDA under an Emergency Use Authorization (EUA). This EUA will remain  in effect (meaning this test can be used) for the duration of the COVID-19 declaration under Section 564(b)(1) of the Act, 21 U.S.C.section 360bbb-3(b)(1), unless the authorization is terminated  or revoked sooner.       Influenza A by PCR NEGATIVE NEGATIVE Final   Influenza B by PCR NEGATIVE NEGATIVE Final    Comment: (NOTE) The Xpert Xpress SARS-CoV-2/FLU/RSV plus assay is intended as an aid in the diagnosis of influenza from Nasopharyngeal swab specimens and should not be used as a sole basis for treatment. Nasal washings and aspirates are unacceptable for Xpert Xpress SARS-CoV-2/FLU/RSV testing.  Fact Sheet for Patients: BloggerCourse.com  Fact Sheet for Healthcare Providers: SeriousBroker.it  This test is not yet approved or cleared by the [place]  FDA and has been authorized for detection and/or diagnosis of SARS-CoV-2 by FDA under an Emergency Use Authorization (EUA). This EUA will remain in effect (meaning this test can be used) for the duration of the COVID-19 declaration under Section 564(b)(1) of the Act, 21 U.S.C. section 360bbb-3(b)(1), unless the authorization is terminated or revoked.     Resp Syncytial Virus by PCR NEGATIVE NEGATIVE Final    Comment: (NOTE) Fact Sheet for Patients: BloggerCourse.com  Fact Sheet for Healthcare Providers: SeriousBroker.it  This test is not yet approved or cleared by the [place]  FDA and has been authorized for detection and/or diagnosis of SARS-CoV-2 by FDA under an Emergency Use  Authorization (EUA). This EUA will remain in effect (meaning this test can be used) for the duration of the COVID-19 declaration under Section 564(b)(1) of the Act, 21 U.S.C. section 360bbb-3(b)(1), unless the  authorization is terminated or revoked.  Performed at [facility], [address] [place], [place] [address]     Procedures and diagnostic studies:  CT Angio Chest/Abd/Pel for Dissection W and/or Wo Contrast Result Date: 12/17/2023 CLINICAL DATA:  Acute aortic syndrome suspected. EXAM: CT ANGIOGRAPHY CHEST, ABDOMEN AND PELVIS TECHNIQUE: Non-contrast CT of the chest was initially obtained. Multidetector CT imaging through the chest, abdomen and pelvis was performed using the standard protocol during bolus administration of intravenous contrast. Multiplanar reconstructed images and MIPs were obtained and reviewed to evaluate the vascular anatomy. RADIATION DOSE REDUCTION: This exam was performed according to the departmental dose-optimization program which includes automated exposure control, adjustment of the mA and/or kV according to patient size and/or use of iterative reconstruction technique. CONTRAST:  OMNIPAQUE  IOHEXOL  350 MG/ML SOLN COMPARISON:  CT chest abdomen and pelvis 11/11/2021. FINDINGS: CTA CHEST FINDINGS Cardiovascular: Preferential opacification of the thoracic aorta. No evidence of thoracic aortic aneurysm or dissection. Normal heart size. No pericardial effusion. There is mild calcified atherosclerotic disease throughout the aorta. Mediastinum/Nodes: No enlarged mediastinal, hilar, or axillary lymph nodes. Thyroid  gland, trachea, and esophagus demonstrate no significant findings. Lungs/Pleura: Small nonspecific cyst seen in the right lower lobe. There are minimal tree-in-bud opacities in the left lower lobe. The lungs are otherwise clear. There is no pleural effusion or pneumothorax. Musculoskeletal: No acute osseous abnormality. There is bilateral  gynecomastia. Review of the MIP images confirms the above findings. CTA ABDOMEN AND PELVIS FINDINGS VASCULAR Aorta: Normal caliber aorta without aneurysm, dissection, vasculitis or significant stenosis. Calcified atherosclerotic disease is present. Celiac: Patent without evidence of aneurysm, dissection, vasculitis or significant stenosis. SMA: Patent without evidence of aneurysm, dissection, vasculitis or significant stenosis. Renals: Both renal arteries are patent without evidence of aneurysm, dissection, vasculitis, fibromuscular dysplasia or significant stenosis. IMA: Patent without evidence of aneurysm, dissection, vasculitis or significant stenosis. Inflow: Patent without evidence of aneurysm, dissection, vasculitis or significant stenosis. Veins: No obvious venous abnormality within the limitations of this arterial phase study. Review of the MIP images confirms the above findings. NON-VASCULAR Hepatobiliary: No focal liver abnormality is seen. Status post cholecystectomy. No biliary dilatation. Pancreas: Pancreatic calcifications are present compatible with chronic pancreatitis. There is inflammatory stranding surrounding the head of the pancreas and proximal body. There is no pancreatic ductal dilatation or focal fluid collection. Spleen: Normal in size without focal abnormality. Adrenals/Urinary Tract: Left renal cysts are present measuring up to 19 mm. There is no hydronephrosis in either kidney. The adrenal glands and bladder are within normal limits. Stomach/Bowel: Stomach is within normal limits. Appendix appears normal. There is sigmoid colon diverticulosis. There is no bowel obstruction, pneumatosis or free air. There is wall thickening and marked inflammation and fluid surrounding the fourth portion of the duodenal. No fluid collection or free air. Stomach is within normal limits. Lymphatic: There are nonenlarged peripancreatic and retroperitoneal lymph nodes. Reproductive: Prostate calcifications  are noted. Other: There is no ascites or focal abdominal wall hernia. Musculoskeletal: No acute or significant osseous findings. Review of the MIP images confirms the above findings. IMPRESSION: 1. No evidence for aortic dissection or aneurysm. 2. Findings compatible with acute pancreatitis. No focal fluid collection. 3. Wall thickening and inflammation of the fourth portion of the duodenum is likely reactive. 4. Minimal tree-in-bud opacities in the left lower lobe may be infectious/inflammatory. 5. Aortic atherosclerosis. Aortic Atherosclerosis (ICD10-I70.0). Electronically Signed   By: Greig Pique M.D.   On: 12/17/2023 23:23   DG Chest [facility] [address]  Result Date: 12/17/2023 CLINICAL DATA:  Chest pain EXAM: PORTABLE CHEST 1 VIEW COMPARISON:  06/27/2023 FINDINGS: The heart size and mediastinal contours are within normal limits. Both lungs are clear. The visualized skeletal structures are unremarkable. IMPRESSION: No active disease. Electronically Signed   By: Dorethia Molt M.D.   On: 12/17/2023 23:06               LOS: 1 day   Natash Berman  [facility]   Pager on [email or web address]. If 7PM-7AM, please contact night-coverage at [email or web address]     12/18/2023, 11:11 AM

## 2023-12-18 NOTE — Assessment & Plan Note (Signed)
-   We will continue with Zoloft.

## 2023-12-18 NOTE — H&P (Addendum)
 [place]   PATIENT NAME: Darrell Santiago    MR#:  [id number]  DATE OF BIRTH:  10-07-1965  DATE OF ADMISSION:  12/17/2023  PRIMARY CARE PHYSICIAN: Pcp, No   Patient is coming from: Home  REQUESTING/REFERRING PHYSICIAN: Siadecki, Sebastien, MD  CHIEF COMPLAINT:   Chief Complaint  Patient presents with   Shortness of Breath   Chest Pain    HISTORY OF PRESENT ILLNESS:  Darrell Santiago is a 58 y.o. male with medical history significant for anxiety, depression, GERD, hypertension, dyslipidemia and seasonal allergies, who presented to the emergency room with acute onset of epigastric abdominal pain that is sharp with radiation to his back with associated recurrent nausea and vomiting since yesterday.  The patient denied any diarrhea or constipation.  No dysuria, oliguria or hematuria or flank pain.  He usually drinks 2 shots per day and his last drink was few hours before he came to the ER.  He denied any chest pain or palpitations.  Patient has been having mild dyspnea and left-sided lower chest pain radiating to the back.  No significant cough or wheezing.  He continues to smoke three-quarter pack of cigarettes per day.  No dysuria, oliguria or hematuria or flank pain.  ED Course: When he came to the ER, temperature was 99.1, heart rate was 109 with otherwise  normal vital signs.  Labs revealed hypokalemia 2.8 with hypochloremia of 95 and a gap of 18, serum lipase of 383 and magnesium  1.4 with total bili 1.3.  Lactic acid was 3.6 and later 3.9.  CBC showed leukocytosis 13.5 with an neutrophilia and hemoconcentration with macrocytosis.  Respiratory panel came back negative.  UA showed 11-20 WBCs with more than 1046 specific gravity and 100 protein with no bacteria.  EKG as reviewed by me : EKG showed sinus tachycardia with rate 107 with right bundle branch block and Q waves inferiorly. Imaging:  Portable chest x-ray showed no acute cardiopulmonary disease. CTA of the chest, abdomen and  pelvis revealed the following: 1. No evidence for aortic dissection or aneurysm. 2. Findings compatible with acute pancreatitis. No focal fluid collection. 3. Wall thickening and inflammation of the fourth portion of the duodenum is likely reactive. 4. Minimal tree-in-bud opacities in the left lower lobe may be infectious/inflammatory. 5. Aortic atherosclerosis.   The patient was given 40 mg 1 p.o. potassium chloride  and 4 mg of IV Zofran  with 2 g of IV magnesium  sulfate, 50 mcg of IV fentanyl  2 g of IV Rocephin  and 500 mg of IV Flagyl  as well as 1 L bolus of IV lactated ringer . PAST MEDICAL HISTORY:   Past Medical History:  Diagnosis Date   Abnormal EKG    Allergy    Seasonal   Anxiety    Chicken pox    COPD (chronic obstructive pulmonary disease) (HCC)    COVID-19    04/20/21   Depression    GERD (gastroesophageal reflux disease)    Hyperlipidemia    Hypertension    Urine incontinence     PAST SURGICAL HISTORY:   Past Surgical History:  Procedure Laterality Date   CHOLECYSTECTOMY  2000   LEFT HEART CATH AND CORONARY ANGIOGRAPHY N/A 01/31/2017   Procedure: LEFT HEART CATH AND CORONARY ANGIOGRAPHY;  Surgeon: Fernand Denyse LABOR, MD;  Location: [facility] INVASIVE CV LAB;  Service: Cardiovascular;  Laterality: N/A;    SOCIAL HISTORY:   Social History   Tobacco Use   Smoking status: Every Day    Current  packs/day: 1.00    Average packs/day: 1 pack/day for 26.0 years (26.0 ttl pk-yrs)    Types: Cigarettes   Smokeless tobacco: Never   Tobacco comments:    little less than one ppd  Substance Use Topics   Alcohol use: Yes    Alcohol/week: 21.0 standard drinks of alcohol    Types: 21 Shots of liquor per week    FAMILY HISTORY:   Family History  Problem Relation Age of Onset   Alcohol abuse Father    Diabetes Father    Cancer Father        ?stage IV thyroid  with met   Cancer Maternal Grandfather        Colon Cancer    DRUG ALLERGIES:  No Known Allergies  REVIEW OF  SYSTEMS:   ROS As per history of present illness. All pertinent systems were reviewed above. Constitutional, HEENT, cardiovascular, respiratory, GI, GU, musculoskeletal, neuro, psychiatric, endocrine, integumentary and hematologic systems were reviewed and are otherwise negative/unremarkable except for positive findings mentioned above in the HPI.   MEDICATIONS AT HOME:   Prior to Admission medications   Medication Sig Start Date End Date Taking? Authorizing Provider  acetaminophen  (TYLENOL ) 325 MG tablet Take 2 tablets (650 mg total) by mouth every 6 (six) hours as needed for mild pain (or Fever >/= 101). 07/26/18   Sherial Bail, MD  benzonatate  (TESSALON ) 100 MG capsule Take 1-2 capsules (100-200 mg total) by mouth 3 (three) times daily as needed. 05/12/23   Vivienne Delon HERO, PA-C  budesonide -formoterol  (SYMBICORT ) 160-4.5 MCG/ACT inhaler Inhale 2 puffs into the lungs 2 (two) times daily. Rinse mouth after use 08/09/22   Hope Merle, MD  cetirizine  (ZYRTEC  ALLERGY) 10 MG tablet Take 1 tablet (10 mg total) by mouth daily. 11/27/22   Lavell Bari LABOR, FNP  fluticasone  (FLONASE ) 50 MCG/ACT nasal spray Place 2 sprays into both nostrils daily. 11/27/22   Lavell Bari LABOR, FNP  furosemide  (LASIX ) 20 MG tablet Take 1 tablet (20 mg total) by mouth daily. 05/02/23 05/01/24  Arlander Charleston, MD  hydrOXYzine  (ATARAX ) 25 MG tablet Take 1 tablet (25 mg total) by mouth daily as needed. 08/09/22   Hope Merle, MD  ipratropium-albuterol  (DUONEB) 0.5-2.5 (3) MG/3ML SOLN Inhale 3 mLs into the lungs every 6 (six) hours as needed. 08/09/22   Hope Merle, MD  ondansetron  (ZOFRAN -ODT) 4 MG disintegrating tablet Take 1 tablet (4 mg total) by mouth every 8 (eight) hours as needed for nausea or vomiting. 12/17/23   Lavell Bari LABOR, FNP  potassium chloride  SA (KLOR-CON  M) 20 MEQ tablet Take 1 tablet (20 mEq total) by mouth 2 (two) times daily. 10/06/22   Hope Merle, MD  sertraline  (ZOLOFT ) 50 MG tablet Take 1 tablet  (50 mg total) by mouth daily. 11/11/23 11/10/24  Jacolyn Pae, MD      VITAL SIGNS:  Blood pressure (!) 142/83, pulse 96, temperature 98.4 F (36.9 C), temperature source Oral, resp. rate 17, height 5' 9.5 (1.765 m), weight 90.7 kg, SpO2 98%.  PHYSICAL EXAMINATION:  Physical Exam  GENERAL:  58 y.o.-year-old Caucasian male patient lying in the bed with no acute distress.  EYES: Pupils equal, round, reactive to light and accommodation. No scleral icterus. Extraocular muscles intact.  HEENT: Head atraumatic, normocephalic. Oropharynx and nasopharynx clear.  NECK:  Supple, no jugular venous distention. No thyroid  enlargement, no tenderness.  LUNGS: Normal breath sounds bilaterally, no wheezing, rales,rhonchi or crepitation. No use of accessory muscles of respiration.  CARDIOVASCULAR: Regular rate and rhythm,  S1, S2 normal. No murmurs, rubs, or gallops.  ABDOMEN: Soft, nondistended, with epigastric tenderness without rebound/guarding or rigidity.  Bowel sounds present. No organomegaly or mass.  EXTREMITIES: No pedal edema, cyanosis, or clubbing.  NEUROLOGIC: Cranial nerves II through XII are intact. Muscle strength 5/5 in all extremities. Sensation intact. Gait not checked.  PSYCHIATRIC: The patient is alert and oriented x 3.  Normal affect and good eye contact. SKIN: No obvious rash, lesion, or ulcer.   LABORATORY PANEL:   CBC Recent Labs  Lab 12/17/23 2203  WBC 13.5*  HGB 18.4*  HCT 52.3*  PLT 188   ------------------------------------------------------------------------------------------------------------------  Chemistries  Recent Labs  Lab 12/17/23 2203 12/18/23 0006  NA 144  --   K 2.8*  --   CL 95*  --   CO2 31  --   GLUCOSE 130*  --   BUN 14  --   CREATININE 0.86  --   CALCIUM  10.3  --   MG  --  1.4*  AST 35  --   ALT 18  --   ALKPHOS 110  --   BILITOT 1.3*  --     ------------------------------------------------------------------------------------------------------------------  Cardiac Enzymes No results for input(s): TROPONINI in the last 168 hours. ------------------------------------------------------------------------------------------------------------------  RADIOLOGY:  CT Angio Chest/Abd/Pel for Dissection W and/or Wo Contrast Result Date: 12/17/2023 CLINICAL DATA:  Acute aortic syndrome suspected. EXAM: CT ANGIOGRAPHY CHEST, ABDOMEN AND PELVIS TECHNIQUE: Non-contrast CT of the chest was initially obtained. Multidetector CT imaging through the chest, abdomen and pelvis was performed using the standard protocol during bolus administration of intravenous contrast. Multiplanar reconstructed images and MIPs were obtained and reviewed to evaluate the vascular anatomy. RADIATION DOSE REDUCTION: This exam was performed according to the departmental dose-optimization program which includes automated exposure control, adjustment of the mA and/or kV according to patient size and/or use of iterative reconstruction technique. CONTRAST:  OMNIPAQUE  IOHEXOL  350 MG/ML SOLN COMPARISON:  CT chest abdomen and pelvis 11/11/2021. FINDINGS: CTA CHEST FINDINGS Cardiovascular: Preferential opacification of the thoracic aorta. No evidence of thoracic aortic aneurysm or dissection. Normal heart size. No pericardial effusion. There is mild calcified atherosclerotic disease throughout the aorta. Mediastinum/Nodes: No enlarged mediastinal, hilar, or axillary lymph nodes. Thyroid  gland, trachea, and esophagus demonstrate no significant findings. Lungs/Pleura: Small nonspecific cyst seen in the right lower lobe. There are minimal tree-in-bud opacities in the left lower lobe. The lungs are otherwise clear. There is no pleural effusion or pneumothorax. Musculoskeletal: No acute osseous abnormality. There is bilateral gynecomastia. Review of the MIP images confirms the above  findings. CTA ABDOMEN AND PELVIS FINDINGS VASCULAR Aorta: Normal caliber aorta without aneurysm, dissection, vasculitis or significant stenosis. Calcified atherosclerotic disease is present. Celiac: Patent without evidence of aneurysm, dissection, vasculitis or significant stenosis. SMA: Patent without evidence of aneurysm, dissection, vasculitis or significant stenosis. Renals: Both renal arteries are patent without evidence of aneurysm, dissection, vasculitis, fibromuscular dysplasia or significant stenosis. IMA: Patent without evidence of aneurysm, dissection, vasculitis or significant stenosis. Inflow: Patent without evidence of aneurysm, dissection, vasculitis or significant stenosis. Veins: No obvious venous abnormality within the limitations of this arterial phase study. Review of the MIP images confirms the above findings. NON-VASCULAR Hepatobiliary: No focal liver abnormality is seen. Status post cholecystectomy. No biliary dilatation. Pancreas: Pancreatic calcifications are present compatible with chronic pancreatitis. There is inflammatory stranding surrounding the head of the pancreas and proximal body. There is no pancreatic ductal dilatation or focal fluid collection. Spleen: Normal in size without focal abnormality. Adrenals/Urinary Tract:  Left renal cysts are present measuring up to 19 mm. There is no hydronephrosis in either kidney. The adrenal glands and bladder are within normal limits. Stomach/Bowel: Stomach is within normal limits. Appendix appears normal. There is sigmoid colon diverticulosis. There is no bowel obstruction, pneumatosis or free air. There is wall thickening and marked inflammation and fluid surrounding the fourth portion of the duodenal. No fluid collection or free air. Stomach is within normal limits. Lymphatic: There are nonenlarged peripancreatic and retroperitoneal lymph nodes. Reproductive: Prostate calcifications are noted. Other: There is no ascites or focal abdominal  wall hernia. Musculoskeletal: No acute or significant osseous findings. Review of the MIP images confirms the above findings. IMPRESSION: 1. No evidence for aortic dissection or aneurysm. 2. Findings compatible with acute pancreatitis. No focal fluid collection. 3. Wall thickening and inflammation of the fourth portion of the duodenum is likely reactive. 4. Minimal tree-in-bud opacities in the left lower lobe may be infectious/inflammatory. 5. Aortic atherosclerosis. Aortic Atherosclerosis (ICD10-I70.0). Electronically Signed   By: Greig Pique M.D.   On: 12/17/2023 23:23   DG Chest Port 1 View Result Date: 12/17/2023 CLINICAL DATA:  Chest pain EXAM: PORTABLE CHEST 1 VIEW COMPARISON:  06/27/2023 FINDINGS: The heart size and mediastinal contours are within normal limits. Both lungs are clear. The visualized skeletal structures are unremarkable. IMPRESSION: No active disease. Electronically Signed   By: Dorethia Molt M.D.   On: 12/17/2023 23:06      IMPRESSION AND PLAN:  Assessment and Plan: * Acute pancreatitis - The patient will be admitted to a cardiac telemetry bed. - He will be kept n.p.o. except for medications. - Will follow serial lipase levels. - Pain management will be provided. - This is likely alcoholic pancreatitis. - We will follow LFTs.  Sepsis due to undetermined organism [facility]) - This is likely secondary to #1 and could be related to subsequent early left lower lobe aspiration pneumonia due to recurrent nausea and vomiting. - This could also be related to acute lower UTI - The patient will be placed on IV Rocephin  and Flagyl . - Will continue hydration with IV normal saline. - Will follow blood and urine cultures.  Alcohol abuse - The patient will be placed on CIWA protocol.  Multivitamins, folate and thiamine  will be given.  Depression - We will continue with Zoloft .  Tobacco abuse I counseled him for smoking cessation and he will receive further counseling  here.   DVT prophylaxis: Lovenox .  Advanced Care Planning:  Code Status: full code.  Family Communication:  The plan of care was discussed in details with the patient (and family). I answered all questions. The patient agreed to proceed with the above mentioned plan. Further management will depend upon hospital course. Disposition Plan: Back to previous home environment Consults called: none.  All the records are reviewed and case discussed with ED provider.  Status is: Inpatient  At the time of the admission, it appears that the appropriate admission status for this patient is inpatient.  This is judged to be reasonable and necessary in order to provide the required intensity of service to ensure the patient's safety given the presenting symptoms, physical exam findings and initial radiographic and laboratory data in the context of comorbid conditions.  The patient requires inpatient status due to high intensity of service, high risk of further deterioration and high frequency of surveillance required.  I certify that at the time of admission, it is my clinical judgment that the patient will require inpatient hospital care  extending more than 2 midnights.                            Dispo: The patient is from: Home              Anticipated d/c is to: Home              Patient currently is not medically stable to d/c.              Difficult to place patient: No  Madison DELENA Peaches M.D on 12/18/2023 at 4:06 AM  [facility]   From 7 PM-7 AM, contact night-coverage [email or web address]  CC: Primary care physician; Pcp, No

## 2023-12-18 NOTE — TOC Progression Note (Signed)
 Transition of Care [facility]) - Progression Note    Patient Details  Name: Darrell Santiago MRN: [id number] Date of Birth: 03/23/1966  Transition of Care [facility]) CM/SW Contact  Lorraine LILLETTE Fenton, LCSW Phone Number: 12/18/2023, 11:06 AM  Clinical Narrative:     CSW visited pt at bedside, spouse was visiting. Opened discussion with introduction and consulted for substance use education. Pt was fumbling with nurse call, sat up in bed, stated he is not interested in discussing or resources. CSW offered to add resources to the discharge papers and he and spouse agreed to that and accepted that option.  CSW added resources to AVS. No further ICM needs.     Barriers to Discharge: Continued Medical Work up               Expected Discharge Plan and Services                                               Social Drivers of Health (SDOH) Interventions SDOH Screenings   Food Insecurity: No Food Insecurity (05/02/2023)   Received from [facility]  Housing: Low Risk  (05/02/2023)   Received from [facility]  Transportation Needs: No Transportation Needs (05/02/2023)   [facility]  Utilities: Not At Risk (05/02/2023)   Received from [facility]  Depression [phone]): Low Risk  (08/09/2022)  Financial Resource Strain: Low Risk  (05/02/2023)   Received from [facility]  Tobacco Use: High Risk (12/18/2023)    Readmission Risk Interventions     No data to display

## 2023-12-18 NOTE — Assessment & Plan Note (Signed)
-   The patient will be placed on CIWA protocol.  Multivitamins, folate and thiamine  will be given.

## 2023-12-19 ENCOUNTER — Other Ambulatory Visit (HOSPITAL_COMMUNITY): Payer: Self-pay

## 2023-12-19 ENCOUNTER — Inpatient Hospital Stay

## 2023-12-19 ENCOUNTER — Telehealth (HOSPITAL_COMMUNITY): Payer: Self-pay | Admitting: Pharmacy Technician

## 2023-12-19 DIAGNOSIS — I2699 Other pulmonary embolism without acute cor pulmonale: Secondary | ICD-10-CM | POA: Diagnosis present

## 2023-12-19 DIAGNOSIS — K852 Alcohol induced acute pancreatitis without necrosis or infection: Secondary | ICD-10-CM | POA: Diagnosis not present

## 2023-12-19 LAB — RENAL FUNCTION PANEL
Albumin: 3.1 g/dL — ABNORMAL LOW (ref 3.5–5.0)
Anion gap: 15 (ref 5–15)
BUN: 17 mg/dL (ref 6–20)
CO2: 29 mmol/L (ref 22–32)
Calcium: 8.4 mg/dL — ABNORMAL LOW (ref 8.9–10.3)
Chloride: 99 mmol/L (ref 98–111)
Creatinine, Ser: 0.45 mg/dL — ABNORMAL LOW (ref 0.61–1.24)
GFR, Estimated: >60 mL/min (ref >60)
Glucose, Bld: 107 mg/dL — ABNORMAL HIGH (ref 70–99)
Phosphorus: 1.4 mg/dL — ABNORMAL LOW (ref 2.5–4.6)
Potassium: 2.9 mmol/L — ABNORMAL LOW (ref 3.5–5.1)
Sodium: 143 mmol/L (ref 135–145)

## 2023-12-19 LAB — CBC
HCT: 39.9 % (ref 39.0–52.0)
Hemoglobin: 13.5 g/dL (ref 13.0–17.0)
MCH: 36.3 pg — ABNORMAL HIGH (ref 26.0–34.0)
MCHC: 33.8 g/dL (ref 30.0–36.0)
MCV: 107.3 fL — ABNORMAL HIGH (ref 80.0–100.0)
Platelets: 103 K/uL — ABNORMAL LOW (ref 150–400)
RBC: 3.72 MIL/uL — ABNORMAL LOW (ref 4.22–5.81)
RDW: 15.1 % (ref 11.5–15.5)
WBC: 7.6 K/uL (ref 4.0–10.5)
nRBC: 0 % (ref 0.0–0.2)

## 2023-12-19 LAB — LIPASE, BLOOD: Lipase: 70 U/L — ABNORMAL HIGH (ref 11–51)

## 2023-12-19 LAB — URINE CULTURE: Culture: <10000 — AB

## 2023-12-19 LAB — MAGNESIUM: Magnesium: 1.7 mg/dL (ref 1.7–2.4)

## 2023-12-19 MED ORDER — MAGNESIUM SULFATE 2 GM/50ML IV SOLN
2.0000 g | Freq: Once | INTRAVENOUS | Status: AC
  Administered 2023-12-19: 2 g via INTRAVENOUS
  Filled 2023-12-19: qty 50

## 2023-12-19 MED ORDER — POTASSIUM CHLORIDE CRYS ER 20 MEQ PO TBCR
40.0000 meq | EXTENDED_RELEASE_TABLET | ORAL | Status: AC
  Administered 2023-12-19 (×2): 40 meq via ORAL
  Filled 2023-12-19 (×2): qty 2

## 2023-12-19 MED ORDER — APIXABAN 5 MG PO TABS
10.0000 mg | ORAL_TABLET | Freq: Two times a day (BID) | ORAL | Status: DC
  Administered 2023-12-19 – 2023-12-21 (×5): 10 mg via ORAL
  Filled 2023-12-19 (×5): qty 2

## 2023-12-19 MED ORDER — POTASSIUM PHOSPHATES 15 MMOLE/5ML IV SOLN
30.0000 mmol | Freq: Once | INTRAVENOUS | Status: AC
  Administered 2023-12-19: 30 mmol via INTRAVENOUS
  Filled 2023-12-19: qty 10

## 2023-12-19 MED ORDER — APIXABAN 5 MG PO TABS: 5.0000 mg | ORAL_TABLET | Freq: Two times a day (BID) | ORAL | Status: DC

## 2023-12-19 NOTE — Telephone Encounter (Signed)
 Patient Product/process development scientist completed.    The patient is insured through [employer] . Patient has [employer], may use a copay card, and/or apply for patient assistance if available.    Ran test claim for Eliquis  5 mg and the current 30 day co-pay is $30.00.   This test claim was processed through [facility]- copay amounts may vary at other pharmacies due to pharmacy/plan contracts, or as the patient moves through the different stages of their insurance plan.     Reyes Sharps, CPHT Pharmacy Technician III Certified Patient Advocate [facility] Patient Advocate Team Direct Number: [phone]  Fax: [phone]

## 2023-12-19 NOTE — Progress Notes (Addendum)
 Progress Note    Darrell Santiago  FMW:[id number] DOB: 10/27/1965  DOA: 12/17/2023 PCP: Pcp, No      Brief Narrative:    Medical records reviewed and are as summarized below:  Darrell Santiago is a 58 y.o. male with medical history significant for anxiety, depression, alcohol use disorder, tobacco use disorder, GERD, hypertension, dyslipidemia, seasonal allergies, who presented to the hospital with acute onset of abdominal pain that radiated to his back.  Pain was associated with recurrent nausea and vomiting.  He said he drinks about 2 shots of liquor a day.  He was found to have acute appendicitis, likely alcohol induced.      Assessment/Plan:   Principal Problem:   Acute pancreatitis Active Problems:   Hypokalemia   Sepsis due to undetermined organism (HCC)   Tobacco abuse   Hypomagnesemia   Alcohol use disorder   Depression   Hypophosphatemia   Acute pulmonary embolism (HCC)    Body mass index is 23.27 kg/m.   Alcohol-induced acute pancreatitis: Advance diet to heart healthy diet.  Analgesics as needed for pain. Counseled to quit drinking alcohol. Lipase down from [phone].   Hypokalemia: Slowly improving, from 2.5-2.9.  Continue potassium repletion (orally and intravenously).  Hypophosphatemia: Phosphorus is 1.4.  Replete with IV potassium phosphate  and monitor electrolytes. Hypomagnesemia: Improved   Acute tiny nonocclusive subsegmental pulmonary embolism in the right lower lobe, left lower extremity DVT: I was notified by Dr. Minus, radiologist, this morning that [facility] alert was received for CT angio chest abdomen and pelvis done on 12/17/2023.  Upon further review, it was discovered that patient has a tiny nonocclusive subsegmental pulmonary embolus in the anterior right lower lobe. Patient will be started on Eliquis  to treat pulmonary embolism.  Discussed risks, benefits of long-term anticoagulation with patient and his wife at the bedside.  He  agrees to proceed with treatment. Venous duplex of bilateral lower extremities has been ordered to rule out DVT. Venous duplex came back positive for acute DVT involving the left popliteal vein and the paired left gastrocnemius veins.   Left lower lobe opacities: Unable to rule out pneumonia.  Continue empiric IV antibiotics.   Lactic acidosis: Unable to rule out severe sepsis.  Follow-up blood cultures Abnormal urinalysis: Follow-up urine culture report Continue empiric IV antibiotics.   Alcohol use disorder: Continue Librium  and Ativan  as needed per CIWA protocol. Continue thiamine  and multivitamins Tobacco use disorder: Nicotine  patch has been prescribed.  Counseled to quit smoking cigarettes.   Comorbidities include anxiety, depression, hyperlipidemia, hypertension    Diet Order             Diet Heart Fluid consistency: Thin  Diet effective now                                  Consultants: None  Procedures: None    Medications:    apixaban   10 mg Oral BID   Followed by   NOREEN ON 12/26/2023] apixaban   5 mg Oral BID   chlordiazePOXIDE   25 mg Oral QID   folic acid   1 mg Oral Daily   multivitamin with minerals  1 tablet Oral Daily   nicotine   21 mg Transdermal Daily   sertraline   50 mg Oral Daily   thiamine   100 mg Oral Daily   Or   thiamine   100 mg Intravenous Daily   Continuous Infusions:  cefTRIAXone  (ROCEPHIN )  IV 2 g (12/19/23 0249)   magnesium  sulfate bolus IVPB     metronidazole  500 mg (12/19/23 0953)   potassium PHOSPHATE  IVPB (in mmol) 30 mmol (12/19/23 1238)     Anti-infectives (From admission, onward)    Start     Dose/Rate Route Frequency Ordered Stop   12/19/23 0400  cefTRIAXone  (ROCEPHIN ) 2 g in sodium chloride  0.9 % 100 mL IVPB        2 g 200 mL/hr over 30 Minutes Intravenous Every 24 hours 12/18/23 0348 12/24/23 0359   12/18/23 1000  metroNIDAZOLE  (FLAGYL ) IVPB 500 mg        500 mg 100 mL/hr over 60 Minutes  Intravenous Every 12 hours 12/18/23 0348 12/23/23 0959   12/17/23 2345  cefTRIAXone  (ROCEPHIN ) 2 g in sodium chloride  0.9 % 100 mL IVPB        2 g 200 mL/hr over 30 Minutes Intravenous Once 12/17/23 2335 12/18/23 0156   12/17/23 2345  metroNIDAZOLE  (FLAGYL ) IVPB 500 mg        500 mg 100 mL/hr over 60 Minutes Intravenous  Once 12/17/23 2335 12/18/23 0124              Family Communication/Anticipated D/C date and plan/Code Status   DVT prophylaxis:  apixaban  (ELIQUIS ) tablet 10 mg  apixaban  (ELIQUIS ) tablet 5 mg     Code Status: Full Code  Family Communication: Discussed with his wife at the bedside. Disposition Plan: Plan to discharge home   Status is: Inpatient Remains inpatient appropriate because: Acute pancreatitis       Subjective:   Interval events noted.  No chest pain, shortness of breath, abdominal pain.  Abdominal pain has improved.  No nausea or vomiting.  His wife was at the bedside.  Objective:    Vitals:   12/18/23 2336 12/19/23 0359 12/19/23 0700 12/19/23 1236  BP: 114/71 109/63 118/75 138/72  Pulse: 69 61 (!) 53 64  Resp: 18 18 (!) 22 18  Temp: 98.2 F (36.8 C) 98.1 F (36.7 C) 98 F (36.7 C) (!) 97.2 F (36.2 C)  TempSrc:      SpO2: 95% 99% 98% 97%  Weight:      Height:       No data found.   Intake/Output Summary (Last 24 hours) at 12/19/2023 1423 Last data filed at 12/19/2023 1300 Gross per 24 hour  Intake 1200 ml  Output 100 ml  Net 1100 ml   Filed Weights   12/18/23 0138 12/18/23 0504  Weight: 90.7 kg 72.5 kg    Exam:  GEN: NAD SKIN: Warm and dry EYES: No pallor or icterus ENT: MMM CV: RRR PULM: CTA B ABD: soft, ND, NT, +BS CNS: AAO x 3, non focal EXT: No edema or tenderness       Data Reviewed:   I have personally reviewed following labs and imaging studies:  Labs: Labs show the following:   Basic Metabolic Panel: Recent Labs  Lab 12/17/23 2203 12/18/23 0006 12/18/23 0601 12/19/23 0437  NA 144   --  145 143  K 2.8*  --  2.5* 2.9*  CL 95*  --  99 99  CO2 31  --  28 29  GLUCOSE 130*  --  111* 107*  BUN 14  --  14 17  CREATININE 0.86  --  0.54* 0.45*  CALCIUM  10.3  --  9.6 8.4*  MG  --  1.4* 1.7 1.7  PHOS  --   --   --  1.4*  GFR Estimated Creatinine Clearance: 104.9 mL/min (A) (by C-G formula based on SCr of 0.45 mg/dL (L)). Liver Function Tests: Recent Labs  Lab 12/17/23 2203 12/18/23 0601 12/19/23 0437  AST 35 27  --   ALT 18 13  --   ALKPHOS 110 96  --   BILITOT 1.3* 1.4*  --   PROT 7.9 7.0  --   ALBUMIN 4.5 3.8 3.1*   Recent Labs  Lab 12/17/23 2203 12/18/23 0601 12/19/23 0437  LIPASE 383* 208* 70*   No results for input(s): AMMONIA in the last 168 hours. Coagulation profile No results for input(s): INR, PROTIME in the last 168 hours.  CBC: Recent Labs  Lab 12/17/23 2203 12/18/23 0601 12/19/23 0437  WBC 13.5* 11.2* 7.6  NEUTROABS 10.3*  --   --   HGB 18.4* 15.9 13.5  HCT 52.3* 45.4 39.9  MCV 104.4* 103.2* 107.3*  PLT 188 162 103*   Cardiac Enzymes: No results for input(s): CKTOTAL, CKMB, CKMBINDEX, TROPONINI in the last 168 hours. BNP (last 3 results) No results for input(s): PROBNP in the last 8760 hours. CBG: No results for input(s): GLUCAP in the last 168 hours. D-Dimer: No results for input(s): DDIMER in the last 72 hours. Hgb A1c: No results for input(s): HGBA1C in the last 72 hours. Lipid Profile: No results for input(s): CHOL, HDL, LDLCALC, TRIG, CHOLHDL, LDLDIRECT in the last 72 hours. Thyroid  function studies: No results for input(s): TSH, T4TOTAL, T3FREE, THYROIDAB in the last 72 hours.  Invalid input(s): FREET3 Anemia work up: No results for input(s): VITAMINB12, FOLATE, FERRITIN, TIBC, IRON, RETICCTPCT in the last 72 hours. Sepsis Labs: Recent Labs  Lab 12/17/23 2203 12/18/23 0006 12/18/23 0601 12/19/23 0437  WBC 13.5*  --  11.2* 7.6  LATICACIDVEN 3.6* 3.9*  --    --     Microbiology Recent Results (from the past 240 hours)  Urine Culture (for pregnant, neutropenic or urologic patients or patients with an indwelling urinary catheter)     Status: Abnormal   Collection Time: 12/18/23 12:06 AM   Specimen: Urine, Catheterized  Result Value Ref Range Status   Specimen Description   Final    URINE, CATHETERIZED Performed at [facility], [address], [place], [place] [address]    Special Requests   Final    NONE Performed at [facility], [address], [place], [place] [address]    Culture (A)  Final    <10,000 COLONIES/mL INSIGNIFICANT GROWTH Performed at [facility], [address], [place], [place] [address]    Report Status 12/19/2023 FINAL  Final  Resp panel by RT-PCR (RSV, Flu A&B, Covid) Anterior Nasal Swab     Status: None   Collection Time: 12/18/23  2:00 AM   Specimen: Anterior Nasal Swab  Result Value Ref Range Status   SARS Coronavirus 2 by RT PCR NEGATIVE NEGATIVE Final    Comment: (NOTE) SARS-CoV-2 target nucleic acids are NOT DETECTED.  The SARS-CoV-2 RNA is generally detectable in upper respiratory specimens during the acute phase of infection. The lowest concentration of SARS-CoV-2 viral copies this assay can detect is 138 copies/mL. A negative result does not preclude SARS-Cov-2 infection and should not be used as the sole basis for treatment or other patient management decisions. A negative result may occur with  improper specimen collection/handling, submission of specimen other than nasopharyngeal swab, presence of viral mutation(s) within the areas targeted by this assay, and inadequate number of viral copies(<138 copies/mL). A negative result  must be combined with clinical observations, patient history, and epidemiological information. The expected result is Negative.  Fact Sheet for Patients:  BloggerCourse.com  Fact Sheet for Healthcare Providers:   SeriousBroker.it  This test is no t yet approved or cleared by the [place]  FDA and  has been authorized for detection and/or diagnosis of SARS-CoV-2 by FDA under an Emergency Use Authorization (EUA). This EUA will remain  in effect (meaning this test can be used) for the duration of the COVID-19 declaration under Section 564(b)(1) of the Act, 21 U.S.C.section 360bbb-3(b)(1), unless the authorization is terminated  or revoked sooner.       Influenza A by PCR NEGATIVE NEGATIVE Final   Influenza B by PCR NEGATIVE NEGATIVE Final    Comment: (NOTE) The Xpert Xpress SARS-CoV-2/FLU/RSV plus assay is intended as an aid in the diagnosis of influenza from Nasopharyngeal swab specimens and should not be used as a sole basis for treatment. Nasal washings and aspirates are unacceptable for Xpert Xpress SARS-CoV-2/FLU/RSV testing.  Fact Sheet for Patients: BloggerCourse.com  Fact Sheet for Healthcare Providers: SeriousBroker.it  This test is not yet approved or cleared by the [place]  FDA and has been authorized for detection and/or diagnosis of SARS-CoV-2 by FDA under an Emergency Use Authorization (EUA). This EUA will remain in effect (meaning this test can be used) for the duration of the COVID-19 declaration under Section 564(b)(1) of the Act, 21 U.S.C. section 360bbb-3(b)(1), unless the authorization is terminated or revoked.     Resp Syncytial Virus by PCR NEGATIVE NEGATIVE Final    Comment: (NOTE) Fact Sheet for Patients: BloggerCourse.com  Fact Sheet for Healthcare Providers: SeriousBroker.it  This test is not yet approved or cleared by the [place]  FDA and has been authorized for detection and/or diagnosis of SARS-CoV-2 by FDA under an Emergency Use Authorization (EUA). This EUA will remain in effect (meaning this test can be used) for  the duration of the COVID-19 declaration under Section 564(b)(1) of the Act, 21 U.S.C. section 360bbb-3(b)(1), unless the authorization is terminated or revoked.  Performed at [facility], [address] [place], [place] [address]   Culture, blood (Routine X 2) w Reflex to ID Panel     Status: None (Preliminary result)   Collection Time: 12/18/23  6:01 AM   Specimen: BLOOD  Result Value Ref Range Status   Specimen Description BLOOD BLOOD RIGHT ARM  Final   Special Requests   Final    BOTTLES DRAWN AEROBIC AND ANAEROBIC Blood Culture adequate volume   Culture   Final    NO GROWTH 1 DAY Performed at [facility], [address], [place], [place] [address]    Report Status PENDING  Incomplete  Culture, blood (Routine X 2) w Reflex to ID Panel     Status: None (Preliminary result)   Collection Time: 12/18/23  6:01 AM   Specimen: BLOOD  Result Value Ref Range Status   Specimen Description BLOOD BLOOD RIGHT HAND  Final   Special Requests   Final    BOTTLES DRAWN AEROBIC AND ANAEROBIC Blood Culture adequate volume   Culture   Final    NO GROWTH 1 DAY Performed at [facility], [address], [place], [place] [address]    Report Status PENDING  Incomplete    Procedures and diagnostic studies:  CT Angio Chest/Abd/Pel for Dissection W and/or Wo Contrast Addendum Date: 12/19/2023 ADDENDUM REPORT: 12/19/2023 08:10 ADDENDUM: AiDoc Alert: Further focused review of the pulmonary arteries on chest  CT imaging reveals a tiny nonocclusive subsegmental pulmonary embolus in the anterior right lower lobe (image 81/series 5). No other acute pulmonary embolus evident. Critical Value/emergent results were called by telephone at the time of interpretation on 12/19/2023 at 8:08 am to provider Dr. Jens, who verbally acknowledged these results. Electronically Signed   By: Camellia Candle M.D.   On: 12/19/2023 08:10   Result Date: 12/19/2023 CLINICAL DATA:  Acute aortic syndrome  suspected. EXAM: CT ANGIOGRAPHY CHEST, ABDOMEN AND PELVIS TECHNIQUE: Non-contrast CT of the chest was initially obtained. Multidetector CT imaging through the chest, abdomen and pelvis was performed using the standard protocol during bolus administration of intravenous contrast. Multiplanar reconstructed images and MIPs were obtained and reviewed to evaluate the vascular anatomy. RADIATION DOSE REDUCTION: This exam was performed according to the departmental dose-optimization program which includes automated exposure control, adjustment of the mA and/or kV according to patient size and/or use of iterative reconstruction technique. CONTRAST:  OMNIPAQUE  IOHEXOL  350 MG/ML SOLN COMPARISON:  CT chest abdomen and pelvis 11/11/2021. FINDINGS: CTA CHEST FINDINGS Cardiovascular: Preferential opacification of the thoracic aorta. No evidence of thoracic aortic aneurysm or dissection. Normal heart size. No pericardial effusion. There is mild calcified atherosclerotic disease throughout the aorta. Mediastinum/Nodes: No enlarged mediastinal, hilar, or axillary lymph nodes. Thyroid  gland, trachea, and esophagus demonstrate no significant findings. Lungs/Pleura: Small nonspecific cyst seen in the right lower lobe. There are minimal tree-in-bud opacities in the left lower lobe. The lungs are otherwise clear. There is no pleural effusion or pneumothorax. Musculoskeletal: No acute osseous abnormality. There is bilateral gynecomastia. Review of the MIP images confirms the above findings. CTA ABDOMEN AND PELVIS FINDINGS VASCULAR Aorta: Normal caliber aorta without aneurysm, dissection, vasculitis or significant stenosis. Calcified atherosclerotic disease is present. Celiac: Patent without evidence of aneurysm, dissection, vasculitis or significant stenosis. SMA: Patent without evidence of aneurysm, dissection, vasculitis or significant stenosis. Renals: Both renal arteries are patent without evidence of aneurysm, dissection,  vasculitis, fibromuscular dysplasia or significant stenosis. IMA: Patent without evidence of aneurysm, dissection, vasculitis or significant stenosis. Inflow: Patent without evidence of aneurysm, dissection, vasculitis or significant stenosis. Veins: No obvious venous abnormality within the limitations of this arterial phase study. Review of the MIP images confirms the above findings. NON-VASCULAR Hepatobiliary: No focal liver abnormality is seen. Status post cholecystectomy. No biliary dilatation. Pancreas: Pancreatic calcifications are present compatible with chronic pancreatitis. There is inflammatory stranding surrounding the head of the pancreas and proximal body. There is no pancreatic ductal dilatation or focal fluid collection. Spleen: Normal in size without focal abnormality. Adrenals/Urinary Tract: Left renal cysts are present measuring up to 19 mm. There is no hydronephrosis in either kidney. The adrenal glands and bladder are within normal limits. Stomach/Bowel: Stomach is within normal limits. Appendix appears normal. There is sigmoid colon diverticulosis. There is no bowel obstruction, pneumatosis or free air. There is wall thickening and marked inflammation and fluid surrounding the fourth portion of the duodenal. No fluid collection or free air. Stomach is within normal limits. Lymphatic: There are nonenlarged peripancreatic and retroperitoneal lymph nodes. Reproductive: Prostate calcifications are noted. Other: There is no ascites or focal abdominal wall hernia. Musculoskeletal: No acute or significant osseous findings. Review of the MIP images confirms the above findings. IMPRESSION: 1. No evidence for aortic dissection or aneurysm. 2. Findings compatible with acute pancreatitis. No focal fluid collection. 3. Wall thickening and inflammation of the fourth portion of the duodenum is likely reactive. 4. Minimal tree-in-bud opacities in the left lower lobe  may be infectious/inflammatory. 5. Aortic  atherosclerosis. Aortic Atherosclerosis (ICD10-I70.0). Electronically Signed: By: Greig Pique M.D. On: 12/17/2023 23:23   DG Chest Port 1 View Result Date: 12/17/2023 CLINICAL DATA:  Chest pain EXAM: PORTABLE CHEST 1 VIEW COMPARISON:  06/27/2023 FINDINGS: The heart size and mediastinal contours are within normal limits. Both lungs are clear. The visualized skeletal structures are unremarkable. IMPRESSION: No active disease. Electronically Signed   By: Dorethia Molt M.D.   On: 12/17/2023 23:06               LOS: 2 days   Quenesha Douglass  [facility]   Pager on [email or web address]. If 7PM-7AM, please contact night-coverage at [email or web address]     12/19/2023, 2:23 PM

## 2023-12-19 NOTE — Consult Note (Signed)
 PHARMACY - ANTICOAGULATION CONSULT NOTE  Pharmacy Consult for Apixaban   Indication: pulmonary embolus  No Known Allergies  Patient Measurements: Height: 5' 9.5 (176.5 cm) Weight: 72.5 kg (159 lb 14.4 oz) IBW/kg (Calculated) : 71.85 HEPARIN  DW (KG): 90.1  Vital Signs: Temp: 98 F (36.7 C) (09/02 0700) BP: 118/75 (09/02 0700) Pulse Rate: 53 (09/02 0700)  Labs: Recent Labs    12/17/23 2203 12/18/23 0006 12/18/23 0601 12/19/23 0437  HGB 18.4*  --  15.9 13.5  HCT 52.3*  --  45.4 39.9  PLT 188  --  162 103*  CREATININE 0.86  --  0.54* 0.45*  TROPONINIHS 6 7  --   --     Estimated Creatinine Clearance: 104.9 mL/min (A) (by C-G formula based on SCr of 0.45 mg/dL (L)).  Medical History: Past Medical History:  Diagnosis Date   Abnormal EKG    Allergy    Seasonal   Anxiety    Chicken pox    COPD (chronic obstructive pulmonary disease) (HCC)    COVID-19    04/20/21   Depression    GERD (gastroesophageal reflux disease)    Hyperlipidemia    Hypertension    Urine incontinence    Medications:  No AC prior to admission. Enoxaparin  for DVT prophylaxis last dose given 9/1 @ 2114  Assessment: 58yo male patient admitted with acute pancreatitis. PMH includes anxiety, depression, alcohol use disorder, tobacco use disorder, GERD, hypertension, dyslipidemia, seasonal allergies. Pharmacy consulted on day 2 of admission to start Apixaban  for acute PE. Last dose of enoxaparin  40mg  for DVT prophylaxis given 9/1 @2114 .  Baseline AST/ALT, Hgb and plt - WNL  Plan:  DC enoxaparin  Start Apixaban  10mg  po BID x 7 days, then Apixaban  5mg  po BID  Evora Schechter Rodriguez-Guzman PharmD, BCPS 12/19/2023 10:29 AM

## 2023-12-19 NOTE — Consult Note (Signed)
 PHARMACY CONSULT NOTE - ELECTROLYTES  Pharmacy Consult for Electrolyte Monitoring and Replacement   Recent Labs: Height: 5' 9.5 (176.5 cm) Weight: 72.5 kg (159 lb 14.4 oz) IBW/kg (Calculated) : 71.85 Estimated Creatinine Clearance: 104.9 mL/min (A) (by C-G formula based on SCr of 0.45 mg/dL (L)). Potassium (mmol/L)  Date Value  12/19/2023 2.9 (L)   Magnesium  (mg/dL)  Date Value  90/97/7974 1.7   Calcium  (mg/dL)  Date Value  90/97/7974 8.4 (L)   Albumin (g/dL)  Date Value  90/97/7974 3.1 (L)   Phosphorus (mg/dL)  Date Value  90/97/7974 1.4 (L)   Sodium (mmol/L)  Date Value  12/19/2023 143   Corrected Ca: 9.4 mg/dL  Assessment  Darrell Santiago is a 58 y.o. male presenting with alcohol induced pancreatitis. PMH significant for anxiety, depression, alcohol use disorder, tobacco use disorder, GERD, hypertension, dyslipidemia, seasonal allergies . Pharmacy has been consulted to monitor and replace electrolytes.  Diet: heart healthy  Goal of Therapy: Electrolytes WNL  Plan:  K =2.9, Phos 1.4 - replacement  ordered by provider prior to pharmacy consult. (Kcl 40meq po q 4hr x 2 doses and Kphos 30 mmol IV x 1) Mg 1.7, will order 2gm MgSulfate IV replacement to help balance potassium Will repet K and phos tonight after 4-5 hrs after Kphos infusion complete due to possibility for refeeding.  Follow AM labs as well  Ismael Treptow Rodriguez-Guzman PharmD, BCPS 12/19/2023 1:47 PM

## 2023-12-20 DIAGNOSIS — K852 Alcohol induced acute pancreatitis without necrosis or infection: Secondary | ICD-10-CM | POA: Diagnosis not present

## 2023-12-20 DIAGNOSIS — I2699 Other pulmonary embolism without acute cor pulmonale: Secondary | ICD-10-CM

## 2023-12-20 DIAGNOSIS — I82402 Acute embolism and thrombosis of unspecified deep veins of left lower extremity: Secondary | ICD-10-CM | POA: Insufficient documentation

## 2023-12-20 LAB — RENAL FUNCTION PANEL
Albumin: 3.3 g/dL — ABNORMAL LOW (ref 3.5–5.0)
Anion gap: 10 (ref 5–15)
BUN: 14 mg/dL (ref 6–20)
CO2: 28 mmol/L (ref 22–32)
Calcium: 8.4 mg/dL — ABNORMAL LOW (ref 8.9–10.3)
Chloride: 100 mmol/L (ref 98–111)
Creatinine, Ser: 0.52 mg/dL — ABNORMAL LOW (ref 0.61–1.24)
GFR, Estimated: >60 mL/min (ref >60)
Glucose, Bld: 98 mg/dL (ref 70–99)
Phosphorus: 1.3 mg/dL — ABNORMAL LOW (ref 2.5–4.6)
Potassium: 3.2 mmol/L — ABNORMAL LOW (ref 3.5–5.1)
Sodium: 138 mmol/L (ref 135–145)

## 2023-12-20 LAB — PROCALCITONIN: Procalcitonin: <0.1 ng/mL

## 2023-12-20 LAB — MAGNESIUM: Magnesium: 2.4 mg/dL (ref 1.7–2.4)

## 2023-12-20 LAB — PHOSPHORUS: Phosphorus: 1.4 mg/dL — ABNORMAL LOW (ref 2.5–4.6)

## 2023-12-20 LAB — POTASSIUM: Potassium: 3.6 mmol/L (ref 3.5–5.1)

## 2023-12-20 MED ORDER — CHLORDIAZEPOXIDE HCL 25 MG PO CAPS
25.0000 mg | ORAL_CAPSULE | Freq: Two times a day (BID) | ORAL | Status: DC
  Administered 2023-12-20 – 2023-12-21 (×3): 25 mg via ORAL
  Filled 2023-12-20 (×3): qty 1

## 2023-12-20 MED ORDER — K PHOS MONO-SOD PHOS DI & MONO 155-852-130 MG PO TABS
500.0000 mg | ORAL_TABLET | Freq: Two times a day (BID) | ORAL | Status: AC
  Administered 2023-12-20 (×2): 500 mg via ORAL
  Filled 2023-12-20 (×2): qty 2

## 2023-12-20 MED ORDER — POTASSIUM PHOSPHATES 15 MMOLE/5ML IV SOLN
30.0000 mmol | Freq: Once | INTRAVENOUS | Status: AC
  Administered 2023-12-20: 30 mmol via INTRAVENOUS
  Filled 2023-12-20: qty 10

## 2023-12-20 MED ORDER — AMOXICILLIN-POT CLAVULANATE 875-125 MG PO TABS
1.0000 | ORAL_TABLET | Freq: Two times a day (BID) | ORAL | Status: DC
  Administered 2023-12-20 – 2023-12-21 (×3): 1 via ORAL
  Filled 2023-12-20 (×3): qty 1

## 2023-12-20 NOTE — Consult Note (Signed)
 PHARMACY CONSULT NOTE - ELECTROLYTES  Pharmacy Consult for Electrolyte Monitoring and Replacement   Recent Labs: Height: 5' 9.5 (176.5 cm) Weight: 72.5 kg (159 lb 14.4 oz) IBW/kg (Calculated) : 71.85 Estimated Creatinine Clearance: 104.9 mL/min (A) (by C-G formula based on SCr of 0.45 mg/dL (L)). Potassium (mmol/L)  Date Value  12/19/2023 3.6   Magnesium  (mg/dL)  Date Value  90/97/7974 1.7   Calcium  (mg/dL)  Date Value  90/97/7974 8.4 (L)   Albumin (g/dL)  Date Value  90/97/7974 3.1 (L)   Phosphorus (mg/dL)  Date Value  90/97/7974 1.4 (L)   Sodium (mmol/L)  Date Value  12/19/2023 143   Corrected Ca: 9.4 mg/dL  Assessment  Darrell Santiago is a 58 y.o. male presenting with alcohol induced pancreatitis. PMH significant for anxiety, depression, alcohol use disorder, tobacco use disorder, GERD, hypertension, dyslipidemia, seasonal allergies . Pharmacy has been consulted to monitor and replace electrolytes.  Diet: heart healthy  Goal of Therapy: Electrolytes WNL  Plan:  K =3.6, Phos 1.4 - phos replacement reordered from previous provider order. (Kphos 30 mmol IV x 1) Recheck labs in AM, repeat Phos level at 1800   Darrell Santiago, PharmD, [facility] 12/20/2023 1:24 AM

## 2023-12-20 NOTE — TOC Progression Note (Signed)
 Transition of Care [facility]) - Progression Note    Patient Details  Name: Darrell Santiago MRN: [id number] Date of Birth: February 20, 1966  Transition of Care [facility]) CM/SW Contact  Lauraine JAYSON Carpen, LCSW Phone Number: 12/20/2023, 10:42 AM  Clinical Narrative:  No PCP listed in chart. Added list to AVS.     Barriers to Discharge: Continued Medical Work up               Expected Discharge Plan and Services                                               Social Drivers of Health (SDOH) Interventions SDOH Screenings   Food Insecurity: No Food Insecurity (05/02/2023)   Received from [facility]  Housing: Low Risk  (05/02/2023)   Received from [facility]  Transportation Needs: No Transportation Needs (05/02/2023)   Received from [facility]  Utilities: Not At Risk (05/02/2023)   Received from [facility]  Depression [phone]): Low Risk  (08/09/2022)  Financial Resource Strain: Low Risk  (05/02/2023)   Received from [facility]  Tobacco Use: High Risk (12/18/2023)    Readmission Risk Interventions     No data to display

## 2023-12-20 NOTE — Progress Notes (Signed)
  Progress Note   Patient: Darrell Santiago FMW:[id number] DOB: 01-Oct-1965 DOA: 12/17/2023     3 DOS: the patient was seen and examined on 12/20/2023   Brief hospital course: Darrell Santiago is a 58 y.o. male with medical history significant for anxiety, depression, alcohol use disorder, tobacco use disorder, GERD, hypertension, dyslipidemia, seasonal allergies, who presented to the hospital with acute onset of abdominal pain that radiated to his back.  Pain was associated with recurrent nausea and vomiting.  He said he drinks about 2 shots of liquor a day.   He was found to have acute pancreatitis, likely alcohol induced.   Principal Problem:   Acute pancreatitis Active Problems:   Hypokalemia   Sepsis due to undetermined organism (HCC)   Tobacco abuse   Hypomagnesemia   Alcohol use disorder   Depression   Hypophosphatemia   Acute pulmonary embolism (HCC)   Left leg DVT (HCC)   Assessment and Plan:  Alcohol-induced acute pancreatitis Alcohol use disorder Mild thrombocytopenia secondary to alcohol use. Condition has improved, and advised to quit alcohol.  Tolerating diet.     Hypokalemia Hypomagnesemia. Hypophosphatemia. Appear to have severe refractory hypophosphatemia.  Continue replete with IV and oral phosphate.  I will keep patient for another day as phosphate level is lower after IV infusion.       Acute tiny nonocclusive subsegmental pulmonary embolism in the right lower lobe , left lower extremity DVT:  Continue Eliquis .   Left lower lobe opacities Lactic acidosis probably due to alcohol drinking. Sepsis ruled out:  Add a procalcitonin level, not sure this is pneumonia.  I will change oral antibiotics to Augmentin  to cover aspiration organism.  I will discontinue antibiotics if procalcitonin level is normal        Subjective:  Doing well, no additional abdominal pain or nausea vomiting  Physical Exam: Vitals:   12/19/23 2321 12/20/23 0339 12/20/23 0829  12/20/23 1218  BP: 126/80 (!) 141/85 133/84 135/83  Pulse: 69 (!) 59 89 75  Resp: 20  18 18   Temp: 98.3 F (36.8 C) 98 F (36.7 C)  98.8 F (37.1 C)  TempSrc:  Oral    SpO2: 98% 97% 100% 99%  Weight:      Height:       General exam: Appears calm and comfortable  Respiratory system: Clear to auscultation. Respiratory effort normal. Cardiovascular system: S1 & S2 heard, RRR. No JVD, murmurs, rubs, gallops or clicks. No pedal edema. Gastrointestinal system: Abdomen is nondistended, soft and nontender. No organomegaly or masses felt. Normal bowel sounds heard. Central nervous system: Alert and oriented. No focal neurological deficits. Extremities: Symmetric 5 x 5 power. Skin: No rashes, lesions or ulcers Psychiatry: Judgement and insight appear normal. Mood & affect appropriate.    Data Reviewed:  CT scan lab results reviewed.  Family Communication: Updated wife at bedside  Disposition: Status is: Inpatient Remains inpatient appropriate because: Severity of disease, IV treatment     Time spent: 35 minutes  Author: Murvin Mana, MD 12/20/2023 1:22 PM  For on call review [email or web address].

## 2023-12-20 NOTE — Hospital Course (Signed)
 Darrell Santiago is a 58 y.o. male with medical history significant for anxiety, depression, alcohol use disorder, tobacco use disorder, GERD, hypertension, dyslipidemia, seasonal allergies, who presented to the hospital with acute onset of abdominal pain that radiated to his back.  Pain was associated with recurrent nausea and vomiting.  He said he drinks about 2 shots of liquor a day.   He was found to have acute pancreatitis, likely alcohol induced.  Condition improved, medically stable for discharge.

## 2023-12-20 NOTE — Progress Notes (Signed)
 Pt put clothes on, pulled off cardiac monitor and is standing in the hallway saying he is going to leave. He said he doesn't know why his wife left. Assisted pt to return to bedside while charge nurse called wife to learn her whereabouts. She said she simply left the floor to make a phone call and would return. While waiting for wife, pt continues to insist that he is leaving in his truck. Wife finally returns later and convinced pt to return to bed and have heart monitor put back on. He is much calmer with her at bedside and follows her directions.

## 2023-12-20 NOTE — Discharge Instructions (Signed)
 Some PCP options in [place] area- not a comprehensive list  [facility]- [phone] [facility]- [phone] [facility]- [phone] [facility]- [phone] [facility]- [phone] Nichole Molly- [phone]  or [facility] Physician Referral Line [phone]

## 2023-12-21 ENCOUNTER — Other Ambulatory Visit: Payer: Self-pay

## 2023-12-21 DIAGNOSIS — E876 Hypokalemia: Secondary | ICD-10-CM

## 2023-12-21 DIAGNOSIS — I2699 Other pulmonary embolism without acute cor pulmonale: Secondary | ICD-10-CM | POA: Diagnosis not present

## 2023-12-21 DIAGNOSIS — K852 Alcohol induced acute pancreatitis without necrosis or infection: Secondary | ICD-10-CM | POA: Diagnosis not present

## 2023-12-21 LAB — RENAL FUNCTION PANEL
Albumin: 3.2 g/dL — ABNORMAL LOW (ref 3.5–5.0)
Anion gap: 12 (ref 5–15)
BUN: 8 mg/dL (ref 6–20)
CO2: 24 mmol/L (ref 22–32)
Calcium: 8.5 mg/dL — ABNORMAL LOW (ref 8.9–10.3)
Chloride: 99 mmol/L (ref 98–111)
Creatinine, Ser: 0.54 mg/dL — ABNORMAL LOW (ref 0.61–1.24)
GFR, Estimated: >60 mL/min (ref >60)
Glucose, Bld: 113 mg/dL — ABNORMAL HIGH (ref 70–99)
Phosphorus: 3.4 mg/dL (ref 2.5–4.6)
Potassium: 3 mmol/L — ABNORMAL LOW (ref 3.5–5.1)
Sodium: 135 mmol/L (ref 135–145)

## 2023-12-21 LAB — MAGNESIUM: Magnesium: 1.7 mg/dL (ref 1.7–2.4)

## 2023-12-21 MED ORDER — POTASSIUM CHLORIDE 10 MEQ/100ML IV SOLN
10.0000 meq | INTRAVENOUS | Status: AC
  Administered 2023-12-21 (×2): 10 meq via INTRAVENOUS
  Filled 2023-12-21 (×2): qty 100

## 2023-12-21 MED ORDER — NICOTINE 21 MG/24HR TD PT24
21.0000 mg | MEDICATED_PATCH | Freq: Every day | TRANSDERMAL | 0 refills | Status: AC
  Filled 2023-12-21: qty 14, 14d supply, fill #0

## 2023-12-21 MED ORDER — MAGNESIUM OXIDE -MG SUPPLEMENT 400 (240 MG) MG PO TABS
400.0000 mg | ORAL_TABLET | Freq: Two times a day (BID) | ORAL | 0 refills | Status: AC
  Filled 2023-12-21: qty 60, 30d supply, fill #0
  Filled 2023-12-21: qty 60, fill #0

## 2023-12-21 MED ORDER — POTASSIUM CHLORIDE CRYS ER 20 MEQ PO TBCR
40.0000 meq | EXTENDED_RELEASE_TABLET | Freq: Once | ORAL | Status: AC
Start: 2023-12-21 — End: 2023-12-21
  Administered 2023-12-21: 40 meq via ORAL
  Filled 2023-12-21: qty 2

## 2023-12-21 MED ORDER — MAGNESIUM SULFATE 2 GM/50ML IV SOLN
2.0000 g | Freq: Once | INTRAVENOUS | Status: AC
  Administered 2023-12-21: 2 g via INTRAVENOUS
  Filled 2023-12-21: qty 50

## 2023-12-21 MED ORDER — POTASSIUM CHLORIDE CRYS ER 20 MEQ PO TBCR
40.0000 meq | EXTENDED_RELEASE_TABLET | Freq: Two times a day (BID) | ORAL | Status: DC
  Administered 2023-12-21: 40 meq via ORAL
  Filled 2023-12-21: qty 2

## 2023-12-21 MED ORDER — ORAL CARE MOUTH RINSE: 15.0000 mL | OROMUCOSAL | Status: DC | PRN

## 2023-12-21 MED ORDER — APIXABAN 5 MG PO TABS
ORAL_TABLET | ORAL | 0 refills | Status: DC
  Filled 2023-12-21: qty 60, 25d supply, fill #0

## 2023-12-21 MED ORDER — POTASSIUM CHLORIDE CRYS ER 20 MEQ PO TBCR
20.0000 meq | EXTENDED_RELEASE_TABLET | Freq: Two times a day (BID) | ORAL | 0 refills | Status: DC
  Filled 2023-12-21: qty 180, 90d supply, fill #0

## 2023-12-21 NOTE — TOC Transition Note (Signed)
 Transition of Care [facility]) - Discharge Note   Patient Details  Name: Darrell Santiago MRN: [id number] Date of Birth: Aug 17, 1965  [facility] CM/SW Contact:  Lauraine JAYSON Carpen, LCSW Phone Number: 12/21/2023, 11:10 AM   Clinical Narrative:   Patient has orders to discharge home today. No further concerns. CSW signing off.  Final next level of care: Home/Self Care Barriers to Discharge: Barriers Resolved   Patient Goals and CMS Choice            Discharge Placement                    Patient and family notified of of transfer: 12/21/23  Discharge Plan and Services Additional resources added to the After Visit Summary for                                       Social Drivers of Health (SDOH) Interventions SDOH Screenings   Food Insecurity: No Food Insecurity (12/20/2023)  Housing: Low Risk  (12/20/2023)  Transportation Needs: No Transportation Needs (12/20/2023)  Utilities: Not At Risk (12/20/2023)  Depression (PHQ2-9): Low Risk  (08/09/2022)  Financial Resource Strain: Low Risk  (05/02/2023)   Received from [facility]  Tobacco Use: High Risk (12/18/2023)     Readmission Risk Interventions     No data to display

## 2023-12-21 NOTE — Discharge Summary (Signed)
 Physician Discharge Summary   Patient: Darrell Santiago MRN: [id number] DOB: 07-26-1965  Admit date:     12/17/2023  Discharge date: 12/21/23  Discharge Physician: Murvin Mana   PCP: Pcp, No   Recommendations at discharge:   Follow-up with PCP in 1 to 2 weeks.  TOC to arrange  Discharge Diagnoses: Principal Problem:   Acute pancreatitis Active Problems:   Hypokalemia   Sepsis due to undetermined organism (HCC)   Tobacco abuse   Hypomagnesemia   Alcohol use disorder   Depression   Hypophosphatemia   Acute pulmonary embolism (HCC)   Left leg DVT (HCC) Chronic thrombocytopenia.  Secondary to alcohol drinking. Resolved Problems:   * No resolved hospital problems. *  Hospital Course: Darrell Santiago is a 58 y.o. male with medical history significant for anxiety, depression, alcohol use disorder, tobacco use disorder, GERD, hypertension, dyslipidemia, seasonal allergies, who presented to the hospital with acute onset of abdominal pain that radiated to his back.  Pain was associated with recurrent nausea and vomiting.  He said he drinks about 2 shots of liquor a day.   He was found to have acute pancreatitis, likely alcohol induced.  Condition improved, medically stable for discharge.  Assessment and Plan: Alcohol-induced acute pancreatitis Alcohol use disorder Mild thrombocytopenia secondary to alcohol use. Condition has improved, and advised to quit alcohol.  Tolerating diet.     Hypokalemia Hypomagnesemia. Hypophosphatemia. Phosphate finally normalized.  Potassium low 3.0 today.  He will receive 20 mEq of KCl IV x2  and 40 mEq of oral x 2.  I also prescribed 20 mEq KCl oral twice a day at discharge.  Also discontinue the Lasix . Magnesium  still 1.7, prescribed oral magnesium  supplement.     Acute tiny nonocclusive subsegmental pulmonary embolism in the right lower lobe , left lower extremity DVT:  This is new to this admission, Eliquis  started.  Need to follow-up with PCP  as outpatient.   Left lower lobe opacities, pneumonia ruled out. Lactic acidosis probably due to alcohol drinking. Sepsis ruled out:  Patient has procalcitonin level of less than 0.1, does not have bacterial pneumonia.  No need for antibiotics.  Alcohol dementia with sundowning. Per family, patient has been having sundowning at home.  This also happened in the hospital.  Patient probably has dementia.  Need to follow-up with PCP as outpatient.       Consultants: None Procedures performed: None  Disposition: Home Diet recommendation:  Discharge Diet Orders (From admission, onward)     Start     Ordered   12/21/23 0000  Diet - low sodium heart healthy        12/21/23 1054           Cardiac diet DISCHARGE MEDICATION: Allergies as of 12/21/2023   No Known Allergies      Medication List     STOP taking these medications    furosemide  20 MG tablet Commonly known as: Lasix        TAKE these medications    acetaminophen  325 MG tablet Commonly known as: TYLENOL  Take 2 tablets (650 mg total) by mouth every 6 (six) hours as needed for mild pain (or Fever >/= 101).   apixaban  5 MG Tabs tablet Commonly known as: ELIQUIS  Take 2 tablets (10 mg total) by mouth 2 (two) times daily for 5 days, THEN 1 tablet (5 mg total) 2 (two) times daily. Start taking on: December 21, 2023   budesonide -formoterol  160-4.5 MCG/ACT inhaler Commonly known as: Symbicort  Inhale 2  puffs into the lungs 2 (two) times daily. Rinse mouth after use   cetirizine  10 MG tablet Commonly known as: ZyrTEC  Allergy Take 1 tablet (10 mg total) by mouth daily.   fluticasone  50 MCG/ACT nasal spray Commonly known as: FLONASE  Place 2 sprays into both nostrils daily.   hydrOXYzine  25 MG tablet Commonly known as: ATARAX  Take 1 tablet (25 mg total) by mouth daily as needed.   ipratropium-albuterol  0.5-2.5 (3) MG/3ML Soln Commonly known as: DUONEB Inhale 3 mLs into the lungs every 6 (six) hours as  needed.   Magnesium  Chloride 64 MG Tabs Take 64 mg by mouth 2 (two) times daily.   nicotine  21 mg/24hr patch Commonly known as: NICODERM CQ  - dosed in mg/24 hours Place 1 patch (21 mg total) onto the skin daily. Start taking on: December 22, 2023   ondansetron  4 MG disintegrating tablet Commonly known as: ZOFRAN -ODT Take 1 tablet (4 mg total) by mouth every 8 (eight) hours as needed for nausea or vomiting.   potassium chloride  SA 20 MEQ tablet Commonly known as: KLOR-CON  M Take 1 tablet (20 mEq total) by mouth 2 (two) times daily.   sertraline  50 MG tablet Commonly known as: Zoloft  Take 1 tablet (50 mg total) by mouth daily.        Discharge Exam: Filed Weights   12/18/23 0138 12/18/23 0504  Weight: 90.7 kg 72.5 kg   General exam: Appears calm and comfortable  Respiratory system: Clear to auscultation. Respiratory effort normal. Cardiovascular system: S1 & S2 heard, RRR. No JVD, murmurs, rubs, gallops or clicks. No pedal edema. Gastrointestinal system: Abdomen is nondistended, soft and nontender. No organomegaly or masses felt. Normal bowel sounds heard. Central nervous system: Alert and oriented x2. No focal neurological deficits. Extremities: Symmetric 5 x 5 power. Skin: No rashes, lesions or ulcers Psychiatry:  Mood & affect appropriate.    Condition at discharge: fair  The results of significant diagnostics from this hospitalization (including imaging, microbiology, ancillary and laboratory) are listed below for reference.   Imaging Studies: US  Venous Img Lower Bilateral (DVT) Result Date: 12/19/2023 CLINICAL DATA:  Acute PE with shortness of breath. Positive for tobacco use. EXAM: Bilateral LOWER EXTREMITY VENOUS DOPPLER ULTRASOUND TECHNIQUE: Gray-scale sonography with compression, as well as color and duplex ultrasound, were performed to evaluate the deep venous system(s) from the level of the common femoral vein through the popliteal and proximal calf veins.  COMPARISON:  None Available. FINDINGS: VENOUS Normal compressibility of the common femoral, superficial femoral, and popliteal veins, as well as the visualized calf veins on the right. On the left, the popliteal vein and the paired gastrocnemius veins are somewhat dilated with echogenic material and no color flow all consistent with acute DVT. Visualized portions of profunda femoral vein and great saphenous vein unremarkable. No filling defects to suggest DVT on grayscale or color Doppler imaging for the right lower extremity. Doppler waveforms show normal direction of venous flow, normal respiratory plasticity and response to augmentation for the right lower extremity. OTHER None. Limitations: none IMPRESSION: Acute DVT involving the left popliteal vein and the paired left gastrocnemius veins. No DVT involving the right lower extremity. Electronically Signed   By: Cordella Banner   On: 12/19/2023 15:57   CT Angio Chest/Abd/Pel for Dissection W and/or Wo Contrast Addendum Date: 12/19/2023 ADDENDUM REPORT: 12/19/2023 08:10 ADDENDUM: [employer] Alert: Further focused review of the pulmonary arteries on chest CT imaging reveals a tiny nonocclusive subsegmental pulmonary embolus in the anterior right lower lobe (image  81/series 5). No other acute pulmonary embolus evident. Critical Value/emergent results were called by telephone at the time of interpretation on 12/19/2023 at 8:08 am to provider Dr. Jens, who verbally acknowledged these results. Electronically Signed   By: Camellia Candle M.D.   On: 12/19/2023 08:10   Result Date: 12/19/2023 CLINICAL DATA:  Acute aortic syndrome suspected. EXAM: CT ANGIOGRAPHY CHEST, ABDOMEN AND PELVIS TECHNIQUE: Non-contrast CT of the chest was initially obtained. Multidetector CT imaging through the chest, abdomen and pelvis was performed using the standard protocol during bolus administration of intravenous contrast. Multiplanar reconstructed images and MIPs were obtained and reviewed to  evaluate the vascular anatomy. RADIATION DOSE REDUCTION: This exam was performed according to the departmental dose-optimization program which includes automated exposure control, adjustment of the mA and/or kV according to patient size and/or use of iterative reconstruction technique. CONTRAST:  OMNIPAQUE  IOHEXOL  350 MG/ML SOLN COMPARISON:  CT chest abdomen and pelvis 11/11/2021. FINDINGS: CTA CHEST FINDINGS Cardiovascular: Preferential opacification of the thoracic aorta. No evidence of thoracic aortic aneurysm or dissection. Normal heart size. No pericardial effusion. There is mild calcified atherosclerotic disease throughout the aorta. Mediastinum/Nodes: No enlarged mediastinal, hilar, or axillary lymph nodes. Thyroid  gland, trachea, and esophagus demonstrate no significant findings. Lungs/Pleura: Small nonspecific cyst seen in the right lower lobe. There are minimal tree-in-bud opacities in the left lower lobe. The lungs are otherwise clear. There is no pleural effusion or pneumothorax. Musculoskeletal: No acute osseous abnormality. There is bilateral gynecomastia. Review of the MIP images confirms the above findings. CTA ABDOMEN AND PELVIS FINDINGS VASCULAR Aorta: Normal caliber aorta without aneurysm, dissection, vasculitis or significant stenosis. Calcified atherosclerotic disease is present. Celiac: Patent without evidence of aneurysm, dissection, vasculitis or significant stenosis. SMA: Patent without evidence of aneurysm, dissection, vasculitis or significant stenosis. Renals: Both renal arteries are patent without evidence of aneurysm, dissection, vasculitis, fibromuscular dysplasia or significant stenosis. IMA: Patent without evidence of aneurysm, dissection, vasculitis or significant stenosis. Inflow: Patent without evidence of aneurysm, dissection, vasculitis or significant stenosis. Veins: No obvious venous abnormality within the limitations of this arterial phase study. Review of the MIP  images confirms the above findings. NON-VASCULAR Hepatobiliary: No focal liver abnormality is seen. Status post cholecystectomy. No biliary dilatation. Pancreas: Pancreatic calcifications are present compatible with chronic pancreatitis. There is inflammatory stranding surrounding the head of the pancreas and proximal body. There is no pancreatic ductal dilatation or focal fluid collection. Spleen: Normal in size without focal abnormality. Adrenals/Urinary Tract: Left renal cysts are present measuring up to 19 mm. There is no hydronephrosis in either kidney. The adrenal glands and bladder are within normal limits. Stomach/Bowel: Stomach is within normal limits. Appendix appears normal. There is sigmoid colon diverticulosis. There is no bowel obstruction, pneumatosis or free air. There is wall thickening and marked inflammation and fluid surrounding the fourth portion of the duodenal. No fluid collection or free air. Stomach is within normal limits. Lymphatic: There are nonenlarged peripancreatic and retroperitoneal lymph nodes. Reproductive: Prostate calcifications are noted. Other: There is no ascites or focal abdominal wall hernia. Musculoskeletal: No acute or significant osseous findings. Review of the MIP images confirms the above findings. IMPRESSION: 1. No evidence for aortic dissection or aneurysm. 2. Findings compatible with acute pancreatitis. No focal fluid collection. 3. Wall thickening and inflammation of the fourth portion of the duodenum is likely reactive. 4. Minimal tree-in-bud opacities in the left lower lobe may be infectious/inflammatory. 5. Aortic atherosclerosis. Aortic Atherosclerosis (ICD10-I70.0). Electronically Signed: By: Greig Maple HERO.D.  On: 12/17/2023 23:23   DG Chest Port 1 View Result Date: 12/17/2023 CLINICAL DATA:  Chest pain EXAM: PORTABLE CHEST 1 VIEW COMPARISON:  06/27/2023 FINDINGS: The heart size and mediastinal contours are within normal limits. Both lungs are clear. The  visualized skeletal structures are unremarkable. IMPRESSION: No active disease. Electronically Signed   By: Dorethia Molt M.D.   On: 12/17/2023 23:06    Microbiology: Results for orders placed or performed during the hospital encounter of 12/17/23  Urine Culture (for pregnant, neutropenic or urologic patients or patients with an indwelling urinary catheter)     Status: Abnormal   Collection Time: 12/18/23 12:06 AM   Specimen: Urine, Catheterized  Result Value Ref Range Status   Specimen Description   Final    URINE, CATHETERIZED Performed at [facility], [address] [place], [place] [address]    Special Requests   Final    NONE Performed at [facility], [address], [place], [place] [address]    Culture (A)  Final    <10,000 COLONIES/mL INSIGNIFICANT GROWTH Performed at [facility], [address], [place], [place] [address]    Report Status 12/19/2023 FINAL  Final  Resp panel by RT-PCR (RSV, Flu A&B, Covid) Anterior Nasal Swab     Status: None   Collection Time: 12/18/23  2:00 AM   Specimen: Anterior Nasal Swab  Result Value Ref Range Status   SARS Coronavirus 2 by RT PCR NEGATIVE NEGATIVE Final    Comment: (NOTE) SARS-CoV-2 target nucleic acids are NOT DETECTED.  The SARS-CoV-2 RNA is generally detectable in upper respiratory specimens during the acute phase of infection. The lowest concentration of SARS-CoV-2 viral copies this assay can detect is 138 copies/mL. A negative result does not preclude SARS-Cov-2 infection and should not be used as the sole basis for treatment or other patient management decisions. A negative result may occur with  improper specimen collection/handling, submission of specimen other than nasopharyngeal swab, presence of viral mutation(s) within the areas targeted by this assay, and inadequate number of viral copies(<138 copies/mL). A negative result must be combined with clinical observations, patient history,  and epidemiological information. The expected result is Negative.  Fact Sheet for Patients:  BloggerCourse.com  Fact Sheet for Healthcare Providers:  SeriousBroker.it  This test is no t yet approved or cleared by the [place]  FDA and  has been authorized for detection and/or diagnosis of SARS-CoV-2 by FDA under an Emergency Use Authorization (EUA). This EUA will remain  in effect (meaning this test can be used) for the duration of the COVID-19 declaration under Section 564(b)(1) of the Act, 21 U.S.C.section 360bbb-3(b)(1), unless the authorization is terminated  or revoked sooner.       Influenza A by PCR NEGATIVE NEGATIVE Final   Influenza B by PCR NEGATIVE NEGATIVE Final    Comment: (NOTE) The Xpert Xpress SARS-CoV-2/FLU/RSV plus assay is intended as an aid in the diagnosis of influenza from Nasopharyngeal swab specimens and should not be used as a sole basis for treatment. Nasal washings and aspirates are unacceptable for Xpert Xpress SARS-CoV-2/FLU/RSV testing.  Fact Sheet for Patients: BloggerCourse.com  Fact Sheet for Healthcare Providers: SeriousBroker.it  This test is not yet approved or cleared by the [place]  FDA and has been authorized for detection and/or diagnosis of SARS-CoV-2 by FDA under an Emergency Use Authorization (EUA). This EUA will remain in effect (meaning this test can be used) for the duration of the COVID-19 declaration under Section 564(b)(1)  of the Act, 21 U.S.C. section 360bbb-3(b)(1), unless the authorization is terminated or revoked.     Resp Syncytial Virus by PCR NEGATIVE NEGATIVE Final    Comment: (NOTE) Fact Sheet for Patients: BloggerCourse.com  Fact Sheet for Healthcare Providers: SeriousBroker.it  This test is not yet approved or cleared by the [place]  FDA and has  been authorized for detection and/or diagnosis of SARS-CoV-2 by FDA under an Emergency Use Authorization (EUA). This EUA will remain in effect (meaning this test can be used) for the duration of the COVID-19 declaration under Section 564(b)(1) of the Act, 21 U.S.C. section 360bbb-3(b)(1), unless the authorization is terminated or revoked.  Performed at [facility], [address] [place], [place] [address]   Culture, blood (Routine X 2) w Reflex to ID Panel     Status: None (Preliminary result)   Collection Time: 12/18/23  6:01 AM   Specimen: BLOOD  Result Value Ref Range Status   Specimen Description BLOOD BLOOD RIGHT ARM  Final   Special Requests   Final    BOTTLES DRAWN AEROBIC AND ANAEROBIC Blood Culture adequate volume   Culture   Final    NO GROWTH 3 DAYS Performed at [facility], [address] [place], [place] [address]    Report Status PENDING  Incomplete  Culture, blood (Routine X 2) w Reflex to ID Panel     Status: None (Preliminary result)   Collection Time: 12/18/23  6:01 AM   Specimen: BLOOD  Result Value Ref Range Status   Specimen Description BLOOD BLOOD RIGHT HAND  Final   Special Requests   Final    BOTTLES DRAWN AEROBIC AND ANAEROBIC Blood Culture adequate volume   Culture   Final    NO GROWTH 3 DAYS Performed at [facility], [address] [place], [place] [address]    Report Status PENDING  Incomplete    Labs: CBC: Recent Labs  Lab 12/17/23 2203 12/18/23 0601 12/19/23 0437  WBC 13.5* 11.2* 7.6  NEUTROABS 10.3*  --   --   HGB 18.4* 15.9 13.5  HCT 52.3* 45.4 39.9  MCV 104.4* 103.2* 107.3*  PLT 188 162 103*   Basic Metabolic Panel: Recent Labs  Lab 12/17/23 2203 12/18/23 0006 12/18/23 0601 12/19/23 0437 12/19/23 2345 12/20/23 0355 12/21/23 0400  NA 144  --  145 143  --  138 135  K 2.8*  --  2.5* 2.9* 3.6 3.2* 3.0*  CL 95*  --  99 99  --  100 99  CO2 31  --  28 29  --  28 24  GLUCOSE 130*  --  111*  107*  --  98 113*  BUN 14  --  14 17  --  14 8  CREATININE 0.86  --  0.54* 0.45*  --  0.52* 0.54*  CALCIUM  10.3  --  9.6 8.4*  --  8.4* 8.5*  MG  --  1.4* 1.7 1.7  --  2.4 1.7  PHOS  --   --   --  1.4* 1.4* 1.3* 3.4   Liver Function Tests: Recent Labs  Lab 12/17/23 2203 12/18/23 0601 12/19/23 0437 12/20/23 0355 12/21/23 0400  AST 35 27  --   --   --   ALT 18 13  --   --   --   ALKPHOS 110 96  --   --   --   BILITOT 1.3* 1.4*  --   --   --   PROT  7.9 7.0  --   --   --   ALBUMIN 4.5 3.8 3.1* 3.3* 3.2*   CBG: No results for input(s): GLUCAP in the last 168 hours.  Discharge time spent: 45 minutes.  Signed: Murvin Mana, MD [facility] 12/21/2023

## 2023-12-21 NOTE — Consult Note (Signed)
 PHARMACY CONSULT NOTE - ELECTROLYTES  Pharmacy Consult for Electrolyte Monitoring and Replacement   Recent Labs: Height: 5' 9.5 (176.5 cm) Weight: 72.5 kg (159 lb 14.4 oz) IBW/kg (Calculated) : 71.85 Estimated Creatinine Clearance: 104.9 mL/min (A) (by C-G formula based on SCr of 0.54 mg/dL (L)). Potassium (mmol/L)  Date Value  12/21/2023 3.0 (L)   Magnesium  (mg/dL)  Date Value  90/95/7974 1.7   Calcium  (mg/dL)  Date Value  90/95/7974 8.5 (L)   Albumin (g/dL)  Date Value  90/95/7974 3.2 (L)   Phosphorus (mg/dL)  Date Value  90/95/7974 3.4   Sodium (mmol/L)  Date Value  12/21/2023 135   Corrected Ca: 9.4 mg/dL  Assessment  Darrell Santiago is a 58 y.o. male presenting with alcohol induced pancreatitis. PMH significant for anxiety, depression, alcohol use disorder, tobacco use disorder, GERD, hypertension, dyslipidemia, seasonal allergies . Pharmacy has been consulted to monitor and replace electrolytes.  Diet: heart healthy  Goal of Therapy: Electrolytes WNL  Plan:  Mg 2 g IV x 1  Kcl 40 mEq x 2  F/u with AM labs.   Cathaleen Blanch, PharmD, BCPS 12/21/2023 8:02 AM

## 2023-12-23 LAB — CULTURE, BLOOD (ROUTINE X 2)
Culture: NO GROWTH
Culture: NO GROWTH
Special Requests: ADEQUATE
Special Requests: ADEQUATE

## 2024-04-05 ENCOUNTER — Emergency Department
Admission: EM | Admit: 2024-04-05 | Discharge: 2024-04-06 | Disposition: A | Attending: Emergency Medicine | Admitting: Emergency Medicine

## 2024-04-05 ENCOUNTER — Other Ambulatory Visit: Payer: Self-pay

## 2024-04-05 DIAGNOSIS — Z8616 Personal history of COVID-19: Secondary | ICD-10-CM | POA: Insufficient documentation

## 2024-04-05 DIAGNOSIS — F4321 Adjustment disorder with depressed mood: Secondary | ICD-10-CM | POA: Insufficient documentation

## 2024-04-05 DIAGNOSIS — I10 Essential (primary) hypertension: Secondary | ICD-10-CM | POA: Insufficient documentation

## 2024-04-05 DIAGNOSIS — J449 Chronic obstructive pulmonary disease, unspecified: Secondary | ICD-10-CM | POA: Diagnosis not present

## 2024-04-05 DIAGNOSIS — F1010 Alcohol abuse, uncomplicated: Secondary | ICD-10-CM | POA: Diagnosis not present

## 2024-04-05 DIAGNOSIS — F17210 Nicotine dependence, cigarettes, uncomplicated: Secondary | ICD-10-CM | POA: Diagnosis not present

## 2024-04-05 DIAGNOSIS — Y908 Blood alcohol level of 240 mg/100 ml or more: Secondary | ICD-10-CM | POA: Insufficient documentation

## 2024-04-05 DIAGNOSIS — R45851 Suicidal ideations: Secondary | ICD-10-CM

## 2024-04-05 DIAGNOSIS — F32A Depression, unspecified: Secondary | ICD-10-CM | POA: Insufficient documentation

## 2024-04-05 LAB — COMPREHENSIVE METABOLIC PANEL WITH GFR
ALT: 32 U/L (ref 0–44)
AST: 94 U/L — ABNORMAL HIGH (ref 15–41)
Albumin: 4.4 g/dL (ref 3.5–5.0)
Alkaline Phosphatase: 109 U/L (ref 38–126)
Anion gap: 18 — ABNORMAL HIGH (ref 5–15)
BUN: 9 mg/dL (ref 6–20)
CO2: 29 mmol/L (ref 22–32)
Calcium: 9.3 mg/dL (ref 8.9–10.3)
Chloride: 101 mmol/L (ref 98–111)
Creatinine, Ser: 0.74 mg/dL (ref 0.61–1.24)
GFR, Estimated: >60 mL/min
Glucose, Bld: 95 mg/dL (ref 70–99)
Potassium: 2.6 mmol/L — CL (ref 3.5–5.1)
Sodium: 148 mmol/L — ABNORMAL HIGH (ref 135–145)
Total Bilirubin: 0.7 mg/dL (ref 0.0–1.2)
Total Protein: 7.4 g/dL (ref 6.5–8.1)

## 2024-04-05 LAB — CBC
HCT: 53 % — ABNORMAL HIGH (ref 39.0–52.0)
Hemoglobin: 18.1 g/dL — ABNORMAL HIGH (ref 13.0–17.0)
MCH: 35.8 pg — ABNORMAL HIGH (ref 26.0–34.0)
MCHC: 34.2 g/dL (ref 30.0–36.0)
MCV: 104.7 fL — ABNORMAL HIGH (ref 80.0–100.0)
Platelets: 165 K/uL (ref 150–400)
RBC: 5.06 MIL/uL (ref 4.22–5.81)
RDW: 13.8 % (ref 11.5–15.5)
WBC: 6.6 K/uL (ref 4.0–10.5)
nRBC: 0 % (ref 0.0–0.2)

## 2024-04-05 LAB — ETHANOL: Alcohol, Ethyl (B): 315 mg/dL

## 2024-04-05 MED ORDER — LORAZEPAM 2 MG PO TABS: 0.0000 mg | ORAL_TABLET | Freq: Two times a day (BID) | ORAL | Status: DC

## 2024-04-05 MED ORDER — LORAZEPAM 2 MG/ML IJ SOLN: 0.0000 mg | Freq: Two times a day (BID) | INTRAMUSCULAR | Status: DC

## 2024-04-05 MED ORDER — POTASSIUM CHLORIDE 10 MEQ/100ML IV SOLN
10.0000 meq | Freq: Once | INTRAVENOUS | Status: AC
  Administered 2024-04-05: 10 meq via INTRAVENOUS
  Filled 2024-04-05: qty 100

## 2024-04-05 MED ORDER — POTASSIUM CHLORIDE CRYS ER 20 MEQ PO TBCR
40.0000 meq | EXTENDED_RELEASE_TABLET | Freq: Once | ORAL | Status: AC
  Administered 2024-04-05: 40 meq via ORAL
  Filled 2024-04-05: qty 2

## 2024-04-05 MED ORDER — SODIUM CHLORIDE 0.9 % IV BOLUS
1000.0000 mL | Freq: Once | INTRAVENOUS | Status: AC
  Administered 2024-04-05: 1000 mL via INTRAVENOUS

## 2024-04-05 MED ORDER — LORAZEPAM 2 MG/ML IJ SOLN: 0.0000 mg | Freq: Four times a day (QID) | INTRAMUSCULAR | Status: DC

## 2024-04-05 MED ORDER — POTASSIUM CHLORIDE CRYS ER 20 MEQ PO TBCR: 40.0000 meq | EXTENDED_RELEASE_TABLET | Freq: Two times a day (BID) | ORAL | Status: DC

## 2024-04-05 MED ORDER — THIAMINE MONONITRATE 100 MG PO TABS
100.0000 mg | ORAL_TABLET | Freq: Every day | ORAL | Status: DC
  Administered 2024-04-06: 100 mg via ORAL
  Filled 2024-04-05: qty 1

## 2024-04-05 MED ORDER — LORAZEPAM 2 MG PO TABS: 0.0000 mg | ORAL_TABLET | Freq: Four times a day (QID) | ORAL | Status: DC

## 2024-04-05 MED ORDER — THIAMINE HCL 100 MG/ML IJ SOLN: 100.0000 mg | Freq: Every day | INTRAMUSCULAR | Status: DC

## 2024-04-05 NOTE — ED Provider Notes (Signed)
 "  [facility] Provider Note    Event Date/Time   First MD Initiated Contact with Patient 04/05/24 2138     (approximate)  History   Chief Complaint: Psychiatric Evaluation  HPI  Darrell Santiago is a 58 y.o. male with a past medical history of anxiety, COPD, gastric reflux, hypertension, hyperlipidemia, alcohol abuse, presents to the emergency department for worsening depression and suicidal ideation.  According to the patient he lost his job today, according to the IVC he was attempting to gain access to a firearm at his home.  The firearm has been removed per report.  Here patient [name] alcohol use, states he lost his job today when asked if he was going to hurt himself he says yes.  Denies any HI.  Patient has no medical complaints today.  Currently eating a food tray.  Physical Exam   Triage Vital Signs: ED Triage Vitals  Encounter Vitals Group     BP 04/05/24 2001 109/72     Girls Systolic BP Percentile --      Girls Diastolic BP Percentile --      Boys Systolic BP Percentile --      Boys Diastolic BP Percentile --      Pulse Rate 04/05/24 2001 69     Resp 04/05/24 2001 18     Temp 04/05/24 2001 98.4 F (36.9 C)     Temp Source 04/05/24 2001 Oral     SpO2 04/05/24 2001 98 %     Weight 04/05/24 2002 175 lb (79.4 kg)     Height 04/05/24 2002 5' 10 (1.778 m)     Head Circumference --      Peak Flow --      Pain Score 04/05/24 2002 0     Pain Loc --      Pain Education --      Exclude from Growth Chart --     Most recent vital signs: Vitals:   04/05/24 2001 04/05/24 2344  BP: 109/72 109/72  Pulse: 69 69  Resp: 18   Temp: 98.4 F (36.9 C)   SpO2: 98%     General: Awake, no distress.  CV:  Good peripheral perfusion.  Regular rate and rhythm  Resp:  Normal effort.  Equal breath sounds bilaterally.  Abd:  No distention.  Soft, nontender.  No rebound or guarding.  ED Results / Procedures / Treatments   MEDICATIONS ORDERED IN  ED: Medications  potassium chloride  10 mEq in 100 mL IVPB (10 mEq Intravenous New Bag/Given 04/05/24 2311)  LORazepam  (ATIVAN ) injection 0-4 mg ( Intravenous See Alternative 04/05/24 2344)    Or  LORazepam  (ATIVAN ) tablet 0-4 mg (0 mg Oral Hold 04/05/24 2344)  LORazepam  (ATIVAN ) injection 0-4 mg (has no administration in time range)    Or  LORazepam  (ATIVAN ) tablet 0-4 mg (has no administration in time range)  thiamine  (VITAMIN B1) tablet 100 mg (has no administration in time range)    Or  thiamine  (VITAMIN B1) injection 100 mg (has no administration in time range)  sodium chloride  0.9 % bolus 1,000 mL (1,000 mLs Intravenous New Bag/Given 04/05/24 2205)  potassium chloride  10 mEq in 100 mL IVPB (0 mEq Intravenous Stopped 04/05/24 2309)  potassium chloride  SA (KLOR-CON  M) CR tablet 40 mEq (40 mEq Oral Given 04/05/24 2202)     IMPRESSION / MDM / ASSESSMENT AND PLAN / ED COURSE  I reviewed the triage vital signs and the nursing notes.  Patient's presentation is most  consistent with acute presentation with potential threat to life or bodily function.  Patient presents emergency department under IVC for suicidal ideation.  Patient states he is very upset he lost his job today he was trying to find a firearm at the home.  Patient continues to admit suicidal thoughts.  Patient's workup today shows a reassuring CBC patient's chemistry does show an anion gap of 18 with a potassium of 2.6.  We will dose IV potassium x 2 rounds oral potassium and IV hydrate.  Patient's alcohol level is 315.  Do not believe the patient's potassium is related to his current presentation.  Patient states a history of chronic hypokalemia supposed to be taking potassium supplements but admits that he has not been doing so for the past month or so.  We will maintain IVC until the patient be adequately evaluated by psychiatry.  I have placed the patient under CIWA protocols as a precaution although he denies any withdrawal  symptoms in the past.  FINAL CLINICAL IMPRESSION(S) / ED DIAGNOSES   Alcohol tox occasion Suicidal ideation  Note:  This document was prepared using Dragon voice recognition software and may include unintentional dictation errors.   [name], MD 04/05/24 2351  "

## 2024-04-05 NOTE — ED Triage Notes (Signed)
 Pt presents for IVC. Per PD report, pt lost his job today and has hx of dementia. Per report, pt attempted to gain access to his firearm with PD at his home. Firearm has since been removed. Pt appears intoxicated (endorsing alcohol use- Everyday drinker of a few shots every night). Currently denies SI/HI/AH/VH

## 2024-04-05 NOTE — ED Notes (Addendum)
 Belongings consist of: 1 shirt, 1 pair of jeans, 1 pair of socks,  1 pair of shoes, pair of underwear, 1 cellphone, 1 charger, 1 charging block. No wallet or keys present

## 2024-04-06 DIAGNOSIS — F1090 Alcohol use, unspecified, uncomplicated: Secondary | ICD-10-CM

## 2024-04-06 DIAGNOSIS — F4321 Adjustment disorder with depressed mood: Secondary | ICD-10-CM

## 2024-04-06 MED ORDER — POTASSIUM CHLORIDE CRYS ER 20 MEQ PO TBCR
40.0000 meq | EXTENDED_RELEASE_TABLET | Freq: Two times a day (BID) | ORAL | Status: DC
  Administered 2024-04-06: 40 meq via ORAL
  Filled 2024-04-06: qty 2

## 2024-04-06 MED ORDER — POTASSIUM CHLORIDE CRYS ER 20 MEQ PO TBCR: 40.0000 meq | EXTENDED_RELEASE_TABLET | Freq: Two times a day (BID) | ORAL | Status: DC

## 2024-04-06 NOTE — BH Assessment (Signed)
 Comprehensive Clinical Assessment (CCA) Note  04/06/2024 Darrell Santiago [id number]  Chief Complaint: Patient is a 58 year old male presenting to [facility] ED under IVC. Per triage note Pt presents for IVC. Per PD report, pt lost his job today and has hx of dementia. Per report, pt attempted to gain access to his firearm with PD at his home. Firearm has since been removed. Pt appears intoxicated (endorsing alcohol use- Everyday drinker of a few shots every night). Currently denies SI/HI/AH/VH.  During assessment patient appears alert and oriented x4, irritable but cooperative. Patient reports I had an attack, I couldn't breathe. In terms of his depression patient reports feeling suicidal earlier I was going to shoot myself, life it's not worth living. Patient denies ever attempting in the past no, I just have thoughts. Patient also reports not having an appetite. Current BAL is 315. Patient denies current SI/HI/AH/VH.  Chief Complaint  Patient presents with   Psychiatric Evaluation   Visit Diagnosis: Depression.  Alcohol use disorder, severe    CCA Screening, Triage and Referral (STR)  Patient Reported Information How did you hear about us ? Legal System  Referral name: No data recorded Referral phone number: No data recorded  Whom do you see for routine medical problems? No data recorded Practice/Facility Name: No data recorded Practice/Facility Phone Number: No data recorded Name of Contact: No data recorded Contact Number: No data recorded Contact Fax Number: No data recorded Prescriber Name: No data recorded Prescriber Address (if known): No data recorded  What Is the Reason for Your Visit/Call Today? Pt presents for IVC. Per PD report, pt lost his job today and has hx of dementia. Per report, pt attempted to gain access to his firearm with PD at his home. Firearm has since been removed. Pt appears intoxicated (endorsing alcohol use- Everyday drinker of a few shots every night).  Currently denies SI/HI/AH/VH  How Long Has This Been Causing You Problems? > than 6 months  What Do You Feel Would Help You the Most Today? No data recorded  Have You Recently Been in Any Inpatient Treatment (Hospital/Detox/Crisis Center/28-Day Program)? No data recorded Name/Location of Program/Hospital:No data recorded How Long Were You There? No data recorded When Were You Discharged? No data recorded  Have You Ever Received Services From [facility] Before? No data recorded Who Do You See at [facility]? No data recorded  Have You Recently Had Any Thoughts About Hurting Yourself? Yes  Are You Planning to Commit Suicide/Harm Yourself At This time? No   Have you Recently Had Thoughts About Hurting Someone Sherral? No  Explanation: No data recorded  Have You Used Any Alcohol or Drugs in the Past 24 Hours? Yes  How Long Ago Did You Use Drugs or Alcohol? No data recorded What Did You Use and How Much? Alcohol. Unknow amounts   Do You Currently Have a Therapist/Psychiatrist? No  Name of Therapist/Psychiatrist: No data recorded  Have You Been Recently Discharged From Any Office Practice or Programs? No  Explanation of Discharge From Practice/Program: No data recorded    CCA Screening Triage Referral Assessment Type of Contact: Face-to-Face  Is this Initial or Reassessment? No data recorded Date Telepsych consult ordered in [facility]:  No data recorded Time Telepsych consult ordered in [facility]:  No data recorded  Patient Reported Information Reviewed? No data recorded Patient Left Without Being Seen? No data recorded Reason for Not Completing Assessment: No data recorded  Collateral Involvement: No data recorded  Does Patient Have a Court  Appointed Legal Guardian? No data recorded Name and Contact of Legal Guardian: No data recorded If Minor and Not Living with Parent(s), Who has Custody? No data recorded Is CPS involved or ever been involved? Never  Is APS involved or ever  been involved? Never   Patient Determined To Be At Risk for Harm To Self or Others Based on Review of Patient Reported Information or Presenting Complaint? No  Method: No data recorded Availability of Means: No data recorded Intent: No data recorded Notification Required: No data recorded Additional Information for Danger to Others Potential: No data recorded Additional Comments for Danger to Others Potential: No data recorded Are There Guns or Other Weapons in Your Home? No  Types of Guns/Weapons: No data recorded Are These Weapons Safely Secured?                            No data recorded Who Could Verify You Are Able To Have These Secured: No data recorded Do You Have any Outstanding Charges, Pending Court Dates, Parole/Probation? No data recorded Contacted To Inform of Risk of Harm To Self or Others: No data recorded  Location of Assessment: [facility] ED   Does Patient Present under Involuntary Commitment? Yes  IVC Papers Initial File Date: No data recorded  [place] of Residence: [place]   Patient Currently Receiving the Following Services: No data recorded  Determination of Need: Emergent (2 hours)   Options For Referral: No data recorded    CCA Biopsychosocial Intake/Chief Complaint:  No data recorded Current Symptoms/Problems: No data recorded  Patient Reported Schizophrenia/Schizoaffective Diagnosis in Past: No   Strengths: Patient is able to communicate his needs  Preferences: No data recorded Abilities: No data recorded  Type of Services Patient Feels are Needed: No data recorded  Initial Clinical Notes/Concerns: No data recorded  Mental Health Symptoms Depression:  Change in energy/activity; Fatigue; Hopelessness; Irritability   Duration of Depressive symptoms: Greater than two weeks   Mania:  None   Anxiety:   Fatigue; Restlessness   Psychosis:  None   Duration of Psychotic symptoms: No data recorded  Trauma:  None   Obsessions:  None    Compulsions:  None   Inattention:  None   Hyperactivity/Impulsivity:  None   Oppositional/Defiant Behaviors:  None   Emotional Irregularity:  Recurrent suicidal behaviors/gestures/threats   Other Mood/Personality Symptoms:  No data recorded   Mental Status Exam Appearance and self-care  Stature:  Average   Weight:  Overweight   Clothing:  Disheveled   Grooming:  Neglected   Cosmetic use:  None   Posture/gait:  Normal   Motor activity:  Not Remarkable   Sensorium  Attention:  Normal   Concentration:  Normal   Orientation:  X5   Recall/memory:  Normal   Affect and Mood  Affect:  Appropriate   Mood:  Depressed   Relating  Eye contact:  Normal   Facial expression:  Responsive   Attitude toward examiner:  Irritable   Thought and Language  Speech flow: Clear and Coherent   Thought content:  Appropriate to Mood and Circumstances   Preoccupation:  None   Hallucinations:  None   Organization:  No data recorded  [employer] of Knowledge:  Fair   Intelligence:  Average   Abstraction:  Normal   Judgement:  Fair   Dance Movement Psychotherapist:  Realistic   Insight:  Fair   Decision Making:  Impulsive   Social Functioning  Social Maturity:  Irresponsible; Impulsive   Social Judgement:  Heedless   Stress  Stressors:  Family conflict   Coping Ability:  Exhausted   Skill Deficits:  None   Supports:  Family     Religion: Religion/Spirituality Are You A Religious Person?: No  Leisure/Recreation: Leisure / Recreation Do You Have Hobbies?: No  Exercise/Diet: Exercise/Diet Do You Exercise?: No Have You Gained or Lost A Significant Amount of Weight in the Past Six Months?: No Do You Follow a Special Diet?: No Do You Have Any Trouble Sleeping?: No   CCA Employment/Education Employment/Work Situation: Employment / Work Situation Employment Situation: Unemployed Has Patient ever Been in Equities Trader?:  No  Education: Education Is Patient Currently Attending School?: No Did You Have An Individualized Education Program (IIEP): No Did You Have Any Difficulty At [employer]?: No Patient's Education Has Been Impacted by Current Illness: No   CCA Family/Childhood History Family and Relationship History: Family history Marital status: Married What types of issues is patient dealing with in the relationship?: unknown Additional relationship information: unknown  Childhood History:  Childhood History By whom was/is the patient raised?: Both parents Did patient suffer any verbal/emotional/physical/sexual abuse as a child?: No Did patient suffer from severe childhood neglect?: No Has patient ever been sexually abused/assaulted/raped as an adolescent or adult?: No Was the patient ever a victim of a crime or a disaster?: No Witnessed domestic violence?: No Has patient been affected by domestic violence as an adult?: No  Child/Adolescent Assessment:     CCA Substance Use Alcohol/Drug Use: Alcohol / Drug Use Pain Medications: see mar Prescriptions: see mar Over the Counter: see mar History of alcohol / drug use?: Yes Negative Consequences of Use: Personal relationships Substance #1 Name of Substance 1: Alcohol 1 - Frequency: daily 1 - Last Use / Amount: 04/05/24                       ASAM's:  Six Dimensions of Multidimensional Assessment  Dimension 1:  Acute Intoxication and/or Withdrawal Potential:      Dimension 2:  Biomedical Conditions and Complications:      Dimension 3:  Emotional, Behavioral, or Cognitive Conditions and Complications:     Dimension 4:  Readiness to Change:     Dimension 5:  Relapse, Continued use, or Continued Problem Potential:     Dimension 6:  Recovery/Living Environment:     ASAM Severity Score:    ASAM Recommended Level of Treatment: ASAM Recommended Level of Treatment: Level III Residential Treatment   Substance use Disorder  (SUD) Substance Use Disorder (SUD)  Checklist Symptoms of Substance Use: Continued use despite having a persistent/recurrent physical/psychological problem caused/exacerbated by use, Continued use despite persistent or recurrent social, interpersonal problems, caused or exacerbated by use, Evidence of tolerance, Persistent desire or unsuccessful efforts to cut down or control use, Presence of craving or strong urge to use, Social, occupational, recreational activities given up or reduced due to use, Recurrent use that results in a failure to fulfill major role obligations (work, school, home), Substance(s) often taken in larger amounts or over longer times than was intended  Recommendations for Services/Supports/Treatments:    DSM5 Diagnoses: Patient Active Problem List   Diagnosis Date Noted   Left leg DVT (HCC) 12/20/2023   Hypophosphatemia 12/19/2023   Acute pulmonary embolism (HCC) 12/19/2023   Sepsis due to undetermined organism (HCC) 12/18/2023   Depression 12/18/2023   Acute pancreatitis 12/17/2023   Episode of recurrent major depressive disorder  11/11/2023   Alcohol use disorder 11/11/2023   Atypical chest pain 06/27/2023   Hyponatremia 10/27/2022   AKI (acute kidney injury) 10/27/2022   Alcohol dependence (HCC) 08/14/2022   History of TIA (transient ischemic attack) 08/14/2022   Colon cancer screening 08/14/2022   Anxiety 08/14/2022   Insomnia 08/14/2022   TIA (transient ischemic attack) 06/30/2022   Thrombocytopenia 06/30/2022   Dyslipidemia 12/30/2021   Mood disorder 12/30/2021   Elevated LFTs 12/30/2021   Adrenal adenoma, left 12/22/2021   Alcoholic hepatitis without ascites (HCC) 12/22/2021   Diverticulosis 12/22/2021   Hypomagnesemia 12/22/2021   Alcohol withdrawal delirium, acute, mixed level of activity (HCC) 11/12/2021   Other chest pain 11/11/2021   Right knee pain 10/26/2021   Lumbar radiculopathy 10/26/2021   Hypokalemia 04/26/2021   Abnormal LFTs  04/26/2021   Shortness of breath 04/26/2021   Neck mass 07/15/2020   Annual physical exam 07/15/2020   Tobacco dependence 07/15/2020   History of suicidal ideation 05/29/2020   Alcohol dependence with alcohol-induced mood disorder (HCC) 05/26/2020   Chronic maxillary sinusitis 05/26/2020   Brain atrophy 05/26/2020   Depression, recurrent 04/29/2020   Overweight (BMI 25.0-29.9) 09/05/2019   Alcohol-induced acute pancreatitis 02/07/2019   Atherosclerosis of native coronary artery of native heart with stable angina pectoris 09/03/2018   Ground glass opacity present on imaging of lung 08/21/2018   Lung nodule 08/21/2018   CAD (coronary artery disease) 08/21/2018   Pleural effusion 08/02/2018   Chronic obstructive pulmonary disease (COPD) (HCC) 08/02/2018   Pancreatic pleural effusion 08/02/2018   Alcohol abuse 07/30/2018   Pancreatic pseudocyst    Pneumonia 07/23/2018   Bilateral impacted cerumen 05/29/2018   Pancreatitis 03/02/2018   Hepatic steatosis 01/19/2018   Stress fracture of left foot 01/19/2018   Elevated liver enzymes 09/19/2017   Hyperlipidemia 09/19/2017   Macrocytosis without anemia 06/05/2017   Anxiety and depression 04/13/2017   HTN (hypertension) 04/13/2017   Abnormal EKG 04/13/2017   Tobacco abuse 02/08/2017   Skin mass 08/12/2016   Neuropathy 02/05/2015   Gastroesophageal reflux disease 02/05/2015   Environmental allergies 02/05/2015    Patient Centered Plan: Patient is on the following Treatment Plan(s):  Depression and Substance Abuse   Referrals to Alternative Service(s): Referred to Alternative Service(s):   Place:   Date:   Time:    Referred to Alternative Service(s):   Place:   Date:   Time:    Referred to Alternative Service(s):   Place:   Date:   Time:    Referred to Alternative Service(s):   Place:   Date:   Time:      @[email or web address] @  [employer], LCAS-A

## 2024-04-06 NOTE — ED Notes (Signed)
 Lunch tray provided to pt.

## 2024-04-06 NOTE — ED Notes (Signed)
 Wife of pt, Lizza, calling for update. Informed would be getting Dc'd today. Wife asking for call back when ready so she may pick him up.

## 2024-04-06 NOTE — ED Notes (Signed)
 Wife called to inform of DC. Wife states would be here shortly.

## 2024-04-06 NOTE — ED Notes (Signed)
 Pt given cup of water per request. Telecart placed in room.

## 2024-04-06 NOTE — ED Provider Notes (Signed)
 Emergency department handoff note  Care of this patient was signed out to me at the end of the previous provider shift.  All pertinent patient information was conveyed and all questions were answered.  Patient pending psychiatric evaluation.  Patient has been seen and assessed by psychiatry, Dr. Anastacio, who recommends outpatient treatment at this time.  I have rescinded this patient's IVC and they are stable for discharge at this time with outpatient psychiatric follow-up.  The patient has been reexamined and is ready to be discharged.  All diagnostic results have been reviewed and discussed with the patient/family.  Care plan has been outlined and the patient/family understands all current diagnoses, results, and treatment plans.  There are no new complaints, changes, or physical findings at this time.  All questions have been addressed and answered.  Patient was instructed to, and agrees to follow-up with their primary care physician as well as return to the emergency department if any new or worsening symptoms develop.  Dispo: Discharge home with psychiatric follow-up   Ninette Cotta K, MD 04/06/24 1223

## 2024-04-06 NOTE — ED Notes (Signed)
IVC/Pending Consult

## 2024-04-06 NOTE — Consult Note (Signed)
 [facility] Santiago [facility] Consult Note  Patient Name: Darrell Santiago MRN: [id number] DOB: 07/29/1965 DATE OF Consult: 04/06/2024 Consult Order details:  Orders (From admission, onward)     Start     Ordered   04/05/24 2330  CONSULT TO CALL ACT TEAM       Ordering Provider: [name], MD  Provider:  (Not yet assigned)  Question:  Reason for Consult?  Answer:  Psych consult   04/05/24 2329   04/05/24 2330  IP CONSULT TO PSYCHIATRY       Ordering Provider: [name], MD  Provider:  (Not yet assigned)  Question Answer Comment  Consult Timeframe ROUTINE - requires response within 24 hours   Reason for Consult? Consult for medication management   Contact phone number where the requesting provider can be reached [phone]      04/05/24 2329            PRIMARY PSYCHIATRIC DIAGNOSES  1.  Adjustment disorder with depressed mood 2.  Alcohol use disorder 3.  Rule out substance induced mood disorder   RECOMMENDATIONS  Recommendations: Medication recommendations: Recommend Zoloft  50mg  po daily for depression - patient was taking this previously per his wife, but cannot get primary care appt until March. Risks, benefits, alternatives, side effects discussed  Non-Medication/therapeutic recommendations: Resources for outpatient therapy and psychiatry; crisis line information; ED return precautions Is inpatient psychiatric hospitalization recommended for this patient? No (Explain why): Patient reports he had suicidal thoughts with plan to shoot himself while intoxicated, no longer endorsing suicidal thoughts. States he has a lot for which to live and identifies significant protective factors. All firearms removed from the home. Patient's wife states she can be with patient or her daughter law will be when she is not home.  From a psychiatric perspective, is this patient appropriate for discharge to an outpatient setting/resource or other less restrictive environment for continued care?  Yes  (Explain why): Had suicidal thoughts while intoxicated, no prior attempts. Denies current suicidal ideation, intent, plan. Feels regretful about events of last night. Is future oriented and identifies reasons to live Follow-Up Telepsychiatry C/L services: We will sign off for now. Please re-consult our service if needed for any concerning changes in the patient's condition, discharge planning, or questions. Communication: Treatment team members (and family members if applicable) who were involved in treatment/care discussions and planning, and with whom we spoke or engaged with via secure text/chat, include the following: Br. Bradler and team via Epic chat     [name] is a 58 year old male with a history of depression, anxiety, alcohol use disorder, hyperlipidemia, hypertension, COPD who presents to the ED after reportedly losing his job and attempting to obtain access to his firearm, per chart review now removed from the home, to kill himself. Chart reviewed, ethanol 315 on ED arrival. Psychiatry consulted for evaluation and management. Patient reports last night while intoxicated he has suicidal thoughts with plan to shoot himself, does not think he would have acted on the thoughts. Denies current suicidal ideation, intent, plan. Reports he feels regretful about what happened last night. Patient endorses intermittent low mood due to stresses including losing his job. Reports he currently just feels tired and wants to go home. Per patient's wife, patient lost his job of 12 years because the company was shutting down. She states he has the early stages of dementia. She states he went to sleep and woke up, was intoxicated and said he had suicidal thoughts and asked for his  gun. She states she didn't think he would act on it, but she got scared. She denies that he has made suicidal statements previously. She states her son took all the guns and they are in his gun safe. She reports he was on Zoloft   previously. Reports that if she is not with him, her daughter-in-law who is a CNA will be with him. She states if patient comes home, they are going to stay at her son's house so patient does not have to be alone. Patient's presentation is consistent with adjustment disorder with depressed mood; alcohol use disorder; rule out substance induced mood disorder. The patient denies current passive and active suicidal ideation, intent, plan. Reports he felt suicidal last night in the setting of alcohol intoxication. Patient's risk for suicide is moderate due to suicidal ideation with plan in the last month, depression, alcohol use, loss of employment. His risk of suicide is mitigated by significant protective factors including family, future oriented, identifies reasons to live, remorseful about circumstances leading to ED presentation, willingness to engage in outpatient mental health treatment, religious beliefs. Therefore, inpatient psychiatric hospitalization is not recommended. Recommendations as above.   I personally spent a total of 50 minutes in the care of the patient today including preparing to see the patient, getting/reviewing separately obtained history, performing a medically appropriate exam/evaluation, counseling and educating, referring and communicating with other health care professionals, documenting clinical information in the EHR, and coordinating care.  Thank you for involving us  in the care of this patient. If you have any additional questions or concerns, please call [phone] and ask for me or the provider on-call.  TELEPSYCHIATRY ATTESTATION & CONSENT  As the provider for this telehealth consult, I attest that I verified the patients identity using two separate identifiers, introduced myself to the patient, provided my credentials, disclosed my location, and performed this encounter via a HIPAA-compliant, real-time, face-to-face, two-way, interactive audio and video platform and with  the full consent and agreement of the patient (or guardian as applicable.)  Patient physical location: ED in [facility]  Telehealth provider physical location: home office in state of [place]    Video start time: 754 AM EST Video end time: 819 AM EST   IDENTIFYING DATA  Darrell Santiago is a 58 y.o. year-old male for whom a psychiatric consultation has been ordered by the primary provider. The patient was identified using two separate identifiers.  CHIEF COMPLAINT/REASON FOR CONSULT  Suicidal ideation    HISTORY OF PRESENT ILLNESS (HPI)  Darrell Santiago is a 58 year old male with a history of depression, anxiety, alcohol use disorder, hyperlipidemia, hypertension, COPD who presents to the ED after reportedly losing his job and attempting to obtain access to his firearm, per chart review now removed from the home, to kill himself. Chart reviewed, ethanol 315 on ED arrival. Psychiatry consulted for evaluation and management.   Patient reports I went a little stupid the other night. Patient reports he was having suicidal thoughts the other night. He reports he had a plan to shoot himself. He states he doesn't think he would have acted on it.He reports he was intoxicated when he had suicidal thoughts and I wasn't thinking right. He reports his religious beliefs are protective against suicide. He reports he feels hopeful that things can get better. Reports his wife, his son and grandchildren are protective against suicide, Denies access to firearms. Denies current passive and active suicidal ideation, intent, plan.He states there's been a lot of bad stuff happening  lately. I know I'll get it worked out. Patient reports he lost his job due to 250 people being laid off, states he lost his job 12/18. Patient reports he can't get his truck back from his son. States he let his son borrow it about a year ago. He states he can't get his son to bring his truck back to him. He reports he is also stressed  about Christmas. Patient endorses intermittent depressed mood, fatigue. Patient reports daily drinking, does not know how much he drinks. He states he doesn't think alcohol is a problem for. He states, I've proven to myself and my wife I can set it down any time I want to. Denies the use of drugs. Denies symptoms consistent with mania/hypomania, paranoia, auditory and visual hallucinations, homicidal ideation. Patient reports he lives with his wife. Patient alert and oriented to person, location, date. Patient registers 3/3 words, recalls 3/3 words on 3-minute recall. Patient reports the current President is Trump. Patient reports the prior President was Biden. Patient reports he wants to return home. States he no longer feels depressed, states he wants to be with his family. Patient states, I just want to give my wife a big hug and tell her 'I'm sorry'. He reports he and his wife have been together for 30 years.    Per patient's wife, patient lost his job of 12 years because the company was shutting down. She states he has the early stages of dementia. She states he went to sleep and woke up, was intoxicated and said he had suicidal thoughts and asked for his gun. She states she didn't think he would act on it, but she got scared. She denies that he has made suicidal statements previously. She states her son took all the guns and they are in his gun safe. She reports he was on Zoloft  previously. Reports that if she is not with him, her daughter-in-law who is a CNA will be with him. She states if patient comes home, they are going to stay at her son's house so patient does not have to be alone.    PAST PSYCHIATRIC HISTORY  Inpt admissions: Denies  Outpt treatment:  Denies  Prior medication trials:  Zoloft , hydroxyzine  Suicide attempts: Denies  Violence: Denies History of Trauma, abuse, neglect, and exploitation:  Denies  C-SSRS 1) In the past month have you wished you were dead or wished you could  go to sleep and not wake up? [x]  Yes []   No 2) In the past month have you actually had any thoughts of killing yourself? [x]   Yes  []   No If YES to 2, ask questions 3, 4, 5, and 6. If NO to 2, go directly to question 6 3) In the past month have you been thinking about how you might do this? [x]   Yes  []   No 4) In the past month have you had these thoughts and had some intention of acting on them?  []   Yes [x]   No 5) In the past month have you started to work out or worked out the details of how to kill yourself? Do you intend to carry out this plan? []   Yes [x]   No 6) Have you ever done anything, started to do anything, or prepared to do anything to end your life? []   Yes [x]   No  Otherwise as per HPI above.  PAST MEDICAL HISTORY  Past Medical History:  Diagnosis Date   Abnormal EKG    Allergy  Seasonal   Anxiety    Chicken pox    COPD (chronic obstructive pulmonary disease) (HCC)    COVID-19    04/20/21   Depression    GERD (gastroesophageal reflux disease)    Hyperlipidemia    Hypertension    Urine incontinence      HOME MEDICATIONS  Facility Ordered Medications  Medication   [COMPLETED] sodium chloride  0.9 % bolus 1,000 mL   [COMPLETED] potassium chloride  10 mEq in 100 mL IVPB   [COMPLETED] potassium chloride  10 mEq in 100 mL IVPB   [COMPLETED] potassium chloride  SA (KLOR-CON  M) CR tablet 40 mEq   LORazepam  (ATIVAN ) injection 0-4 mg   Or   LORazepam  (ATIVAN ) tablet 0-4 mg   [START ON 04/08/2024] LORazepam  (ATIVAN ) injection 0-4 mg   Or   [START ON 04/08/2024] LORazepam  (ATIVAN ) tablet 0-4 mg   thiamine  (VITAMIN B1) tablet 100 mg   Or   thiamine  (VITAMIN B1) injection 100 mg   potassium chloride  SA (KLOR-CON  M) CR tablet 40 mEq   PTA Medications  Medication Sig   acetaminophen  (TYLENOL ) 325 MG tablet Take 2 tablets (650 mg total) by mouth every 6 (six) hours as needed for mild pain (or Fever >/= 101).   budesonide -formoterol  (SYMBICORT ) 160-4.5 MCG/ACT inhaler  Inhale 2 puffs into the lungs 2 (two) times daily. Rinse mouth after use   hydrOXYzine  (ATARAX ) 25 MG tablet Take 1 tablet (25 mg total) by mouth daily as needed.   ipratropium-albuterol  (DUONEB) 0.5-2.5 (3) MG/3ML SOLN Inhale 3 mLs into the lungs every 6 (six) hours as needed.   fluticasone  (FLONASE ) 50 MCG/ACT nasal spray Place 2 sprays into both nostrils daily.   cetirizine  (ZYRTEC  ALLERGY) 10 MG tablet Take 1 tablet (10 mg total) by mouth daily.   sertraline  (ZOLOFT ) 50 MG tablet Take 1 tablet (50 mg total) by mouth daily.   ondansetron  (ZOFRAN -ODT) 4 MG disintegrating tablet Take 1 tablet (4 mg total) by mouth every 8 (eight) hours as needed for nausea or vomiting.   nicotine  (NICODERM CQ  - DOSED IN MG/24 HOURS) 21 mg/24hr patch Place 1 patch (21 mg total) onto the skin daily.   potassium chloride  SA (KLOR-CON  M) 20 MEQ tablet Take 1 tablet (20 mEq total) by mouth 2 (two) times daily.   magnesium  oxide (MAG-OX) 400 (240 Mg) MG tablet Take 1 tablet (400 mg total) by mouth 2 (two) times daily.     ALLERGIES  Allergies[1]  SOCIAL & SUBSTANCE USE HISTORY  Social History   Socioeconomic History   Marital status: Married    Spouse name: Not on file   Number of children: Not on file   Years of education: Not on file   Highest education level: Not on file  Occupational History   Not on file  Tobacco Use   Smoking status: Every Day    Current packs/day: 1.00    Average packs/day: 1 pack/day for 26.0 years (26.0 ttl pk-yrs)    Types: Cigarettes   Smokeless tobacco: Never   Tobacco comments:    little less than one ppd  Vaping Use   Vaping status: Never Used  Substance and Sexual Activity   Alcohol use: Yes    Alcohol/week: 21.0 standard drinks of alcohol    Types: 21 Shots of liquor per week   Drug use: No   Sexual activity: Yes    Partners: Female  Other Topics Concern   Not on file  Social History Narrative   Married   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     High school education   Caffeine- Sodas 2-3 a day, tea occasionally, no coffee       Likes wood carving   Social Drivers of Health   Tobacco Use: High Risk (04/05/2024)   Patient History    Smoking Tobacco Use: Every Day    Smokeless Tobacco Use: Never    Passive Exposure: Not on file  Financial Resource Strain: Low Risk  (05/02/2023)   Received from [facility]   Overall Financial Resource Strain (CARDIA)    Difficulty of Paying Living Expenses: Not hard at all  Food Insecurity: No Food Insecurity (12/20/2023)   Epic    Worried About Radiation Protection Practitioner of Food in the Last Year: Never true    Ran Out of Food in the Last Year: Never true  Transportation Needs: No Transportation Needs (12/20/2023)   Epic    Lack of Transportation (Medical): No    Lack of Transportation (Non-Medical): No  Physical Activity: Not on file  Stress: Not on file  Social Connections: Not on file  Depression (PHQ2-9): Low Risk (08/09/2022)   Depression (PHQ2-9)    PHQ-2 Score: 3  Alcohol Screen: Not on file  Housing: Low Risk (12/20/2023)   Epic    Unable to Pay for Housing in the Last Year: No    Number of Times Moved in the Last Year: 0    Homeless in the Last Year: No  Utilities: Not At Risk (12/20/2023)   Epic    Threatened with loss of utilities: No  Health Literacy: Not on file   Tobacco Use History[2] Social History   Substance and Sexual Activity  Alcohol Use Yes   Alcohol/week: 21.0 standard drinks of alcohol   Types: 21 Shots of liquor per week   Social History   Substance and Sexual Activity  Drug Use No     FAMILY HISTORY  Family History  Problem Relation Age of Onset   Alcohol abuse Father    Diabetes Father    Cancer Father        ?stage IV thyroid  with met   Cancer Maternal Grandfather        Colon Cancer     MENTAL STATUS EXAM (MSE)  Mental Status Exam: General Appearance: Fairly Groomed  Orientation:  Full (Time, Place, and Person)  Memory:  Immediate;    Good Recent;   Fair  Concentration:  Concentration: Good  Recall:  Fair  Attention  Good  Eye Contact:  Good  Speech:  Clear and Coherent  Language:  Good  Volume:  Normal  Mood: Fine  Affect:  Constricted  Thought Process:  Coherent  Thought Content:  Computation  Suicidal Thoughts:  No  Homicidal Thoughts:  No  Judgement:  Fair  Insight:  Fair  Psychomotor Activity:  Normal  Akathisia:  NA  Fund of Knowledge:  Good    Assets:  Communication Skills Social Support  Cognition:  WNL  ADL's:  Intact  AIMS (if indicated):       VITALS  Blood pressure 109/72, pulse 69, temperature 98.4 F (36.9 C), temperature source Oral, resp. rate 18, height 5' 10 (1.778 m), weight 79.4 kg, SpO2 98%.  LABS  Admission on 04/05/2024  Component Date Value Ref Range Status   Sodium 04/05/2024 148 (H)  135 - 145 mmol/L Final   Potassium 04/05/2024 2.6 (LL)  3.5 - 5.1 mmol/L Final   Comment: Critical Value, Read Back and verified with MARTHA  PRIDE @ 04/05/2024 2053 AB    Chloride 04/05/2024 101  98 - 111 mmol/L Final   CO2 04/05/2024 29  22 - 32 mmol/L Final   Glucose, Bld 04/05/2024 95  70 - 99 mg/dL Final   Glucose reference range applies only to samples taken after fasting for at least 8 hours.   BUN 04/05/2024 9  6 - 20 mg/dL Final   Creatinine, Ser 04/05/2024 0.74  0.61 - 1.24 mg/dL Final   Calcium  04/05/2024 9.3  8.9 - 10.3 mg/dL Final   Total Protein 87/80/7974 7.4  6.5 - 8.1 g/dL Final   Albumin 87/80/7974 4.4  3.5 - 5.0 g/dL Final   AST 87/80/7974 94 (H)  15 - 41 U/L Final   ALT 04/05/2024 32  0 - 44 U/L Final   Alkaline Phosphatase 04/05/2024 109  38 - 126 U/L Final   Total Bilirubin 04/05/2024 0.7  0.0 - 1.2 mg/dL Final   GFR, Estimated 04/05/2024 >60  >60 mL/min Final   Comment: (NOTE) Calculated using the CKD-EPI Creatinine Equation (2021)    Anion gap 04/05/2024 18 (H)  5 - 15 Final   Performed at [facility], [address] [place], [place] [address]    Alcohol, Ethyl (B) 04/05/2024 315 (HH)  <15 mg/dL Final   Comment: Critical Value, Read Back and verified with MAR SPANISH @ 04/05/2024 2112 AB (NOTE) For medical purposes only. Performed at [facility], [address] [place], [place] [address]    WBC 04/05/2024 6.6  4.0 - 10.5 K/uL Final   RBC 04/05/2024 5.06  4.22 - 5.81 MIL/uL Final   Hemoglobin 04/05/2024 18.1 (H)  13.0 - 17.0 g/dL Final   HCT 87/80/7974 53.0 (H)  39.0 - 52.0 % Final   MCV 04/05/2024 104.7 (H)  80.0 - 100.0 fL Final   MCH 04/05/2024 35.8 (H)  26.0 - 34.0 pg Final   MCHC 04/05/2024 34.2  30.0 - 36.0 g/dL Final   RDW 87/80/7974 13.8  11.5 - 15.5 % Final   Platelets 04/05/2024 165  150 - 400 K/uL Final   nRBC 04/05/2024 0.0  0.0 - 0.2 % Final   Performed at [facility], [address] [place], [place] [address]    PSYCHIATRIC REVIEW OF SYSTEMS (ROS)  ROS: Notable for the following relevant positive findings: Review of Systems  Psychiatric/Behavioral:  Positive for depression, memory loss, substance abuse and suicidal ideas.     Additional findings:      Musculoskeletal: No abnormal movements observed      Gait & Station: Laying/Sitting      Pain Screening: Denies      Nutrition & Dental Concerns: n/a  RISK FORMULATION/ASSESSMENT  Is the patient experiencing any suicidal or homicidal ideations: No - patient reports he had suicidal thoughts while intoxicated. Denies current suicidal ideation, intent, plan        Protective factors considered for safety management: Family, future oriented, identifies reasons to live, remorseful about circumstances leading to ED presentation, willingness to engage in outpatient mental health treatment, religious beliefs  Risk factors/concerns considered for safety management:  Depression Substance abuse/dependence Male gender  Is there a safety management plan with the patient and treatment team to minimize risk factors and promote protective  factors: Yes           Explain: Resources for  Is crisis care placement or psychiatric hospitalization recommended: No     Based on my current evaluation and risk assessment, patient is determined  at this time to be at:  Moderate Risk  *RISK ASSESSMENT Risk assessment is a dynamic process; it is possible that this patient's condition, and risk level, may change. This should be re-evaluated and managed over time as appropriate. Please re-consult psychiatric consult services if additional assistance is needed in terms of risk assessment and management. If your team decides to discharge this patient, please advise the patient how to best access emergency psychiatric services, or to call 911, if their condition worsens or they feel unsafe in any way.   Erla JAYSON Rase, MD Telepsychiatry Consult Services    [1] No Known Allergies [2]  Social History Tobacco Use  Smoking Status Every Day   Current packs/day: 1.00   Average packs/day: 1 pack/day for 26.0 years (26.0 ttl pk-yrs)   Types: Cigarettes  Smokeless Tobacco Never  Tobacco Comments   little less than one ppd

## 2024-05-02 ENCOUNTER — Inpatient Hospital Stay
Admission: EM | Admit: 2024-05-02 | Discharge: 2024-05-06 | DRG: 897 | Disposition: A | Discharge status: Home / Self Care | Payer: Self-pay

## 2024-05-02 ENCOUNTER — Observation Stay: Payer: Self-pay

## 2024-05-02 ENCOUNTER — Emergency Department: Payer: Self-pay

## 2024-05-02 ENCOUNTER — Other Ambulatory Visit: Payer: Self-pay

## 2024-05-02 DIAGNOSIS — F1027 Alcohol dependence with alcohol-induced persisting dementia: Secondary | ICD-10-CM

## 2024-05-02 DIAGNOSIS — Z7902 Long term (current) use of antithrombotics/antiplatelets: Secondary | ICD-10-CM

## 2024-05-02 DIAGNOSIS — J441 Chronic obstructive pulmonary disease with (acute) exacerbation: Principal | ICD-10-CM | POA: Diagnosis present

## 2024-05-02 DIAGNOSIS — F419 Anxiety disorder, unspecified: Secondary | ICD-10-CM | POA: Diagnosis present

## 2024-05-02 DIAGNOSIS — F02811 Dementia in other diseases classified elsewhere, unspecified severity, with agitation: Secondary | ICD-10-CM | POA: Diagnosis present

## 2024-05-02 DIAGNOSIS — E876 Hypokalemia: Secondary | ICD-10-CM | POA: Diagnosis present

## 2024-05-02 DIAGNOSIS — Z811 Family history of alcohol abuse and dependence: Secondary | ICD-10-CM

## 2024-05-02 DIAGNOSIS — R748 Abnormal levels of other serum enzymes: Secondary | ICD-10-CM | POA: Diagnosis present

## 2024-05-02 DIAGNOSIS — Z9049 Acquired absence of other specified parts of digestive tract: Secondary | ICD-10-CM

## 2024-05-02 DIAGNOSIS — Z8673 Personal history of transient ischemic attack (TIA), and cerebral infarction without residual deficits: Secondary | ICD-10-CM

## 2024-05-02 DIAGNOSIS — R7989 Other specified abnormal findings of blood chemistry: Secondary | ICD-10-CM | POA: Diagnosis present

## 2024-05-02 DIAGNOSIS — Z7951 Long term (current) use of inhaled steroids: Secondary | ICD-10-CM

## 2024-05-02 DIAGNOSIS — Z86711 Personal history of pulmonary embolism: Secondary | ICD-10-CM

## 2024-05-02 DIAGNOSIS — E785 Hyperlipidemia, unspecified: Secondary | ICD-10-CM | POA: Diagnosis present

## 2024-05-02 DIAGNOSIS — Y907 Blood alcohol level of 200-239 mg/100 ml: Secondary | ICD-10-CM | POA: Diagnosis present

## 2024-05-02 DIAGNOSIS — Z79899 Other long term (current) drug therapy: Secondary | ICD-10-CM

## 2024-05-02 DIAGNOSIS — F17210 Nicotine dependence, cigarettes, uncomplicated: Secondary | ICD-10-CM | POA: Diagnosis present

## 2024-05-02 DIAGNOSIS — Z86718 Personal history of other venous thrombosis and embolism: Secondary | ICD-10-CM

## 2024-05-02 DIAGNOSIS — J449 Chronic obstructive pulmonary disease, unspecified: Secondary | ICD-10-CM

## 2024-05-02 DIAGNOSIS — Z8616 Personal history of COVID-19: Secondary | ICD-10-CM

## 2024-05-02 DIAGNOSIS — F10129 Alcohol abuse with intoxication, unspecified: Secondary | ICD-10-CM | POA: Diagnosis present

## 2024-05-02 DIAGNOSIS — F32A Depression, unspecified: Secondary | ICD-10-CM | POA: Diagnosis present

## 2024-05-02 DIAGNOSIS — R000 Tachycardia, unspecified: Secondary | ICD-10-CM | POA: Diagnosis present

## 2024-05-02 DIAGNOSIS — F10188 Alcohol abuse with other alcohol-induced disorder: Secondary | ICD-10-CM | POA: Diagnosis present

## 2024-05-02 DIAGNOSIS — Z7901 Long term (current) use of anticoagulants: Secondary | ICD-10-CM

## 2024-05-02 DIAGNOSIS — F1024 Alcohol dependence with alcohol-induced mood disorder: Secondary | ICD-10-CM

## 2024-05-02 DIAGNOSIS — Z800 Family history of malignant neoplasm of digestive organs: Secondary | ICD-10-CM

## 2024-05-02 DIAGNOSIS — I639 Cerebral infarction, unspecified: Secondary | ICD-10-CM

## 2024-05-02 DIAGNOSIS — F1010 Alcohol abuse, uncomplicated: Principal | ICD-10-CM

## 2024-05-02 DIAGNOSIS — Z833 Family history of diabetes mellitus: Secondary | ICD-10-CM

## 2024-05-02 DIAGNOSIS — J439 Emphysema, unspecified: Secondary | ICD-10-CM

## 2024-05-02 DIAGNOSIS — Z91128 Patient's intentional underdosing of medication regimen for other reason: Secondary | ICD-10-CM

## 2024-05-02 DIAGNOSIS — I10 Essential (primary) hypertension: Secondary | ICD-10-CM | POA: Diagnosis present

## 2024-05-02 DIAGNOSIS — F10120 Alcohol abuse with intoxication, uncomplicated: Principal | ICD-10-CM | POA: Diagnosis present

## 2024-05-02 DIAGNOSIS — R079 Chest pain, unspecified: Secondary | ICD-10-CM | POA: Diagnosis present

## 2024-05-02 DIAGNOSIS — T45516A Underdosing of anticoagulants, initial encounter: Secondary | ICD-10-CM | POA: Diagnosis present

## 2024-05-02 DIAGNOSIS — R29706 NIHSS score 6: Secondary | ICD-10-CM

## 2024-05-02 DIAGNOSIS — F1090 Alcohol use, unspecified, uncomplicated: Secondary | ICD-10-CM | POA: Diagnosis present

## 2024-05-02 DIAGNOSIS — I2510 Atherosclerotic heart disease of native coronary artery without angina pectoris: Secondary | ICD-10-CM | POA: Diagnosis present

## 2024-05-02 DIAGNOSIS — Z716 Tobacco abuse counseling: Secondary | ICD-10-CM

## 2024-05-02 DIAGNOSIS — Z7982 Long term (current) use of aspirin: Secondary | ICD-10-CM

## 2024-05-02 DIAGNOSIS — F17200 Nicotine dependence, unspecified, uncomplicated: Secondary | ICD-10-CM | POA: Diagnosis present

## 2024-05-02 DIAGNOSIS — R531 Weakness: Secondary | ICD-10-CM | POA: Diagnosis present

## 2024-05-02 LAB — CBC WITH DIFFERENTIAL/PLATELET
Abs Immature Granulocytes: 0.04 K/uL (ref 0.00–0.07)
Basophils Absolute: 0.1 K/uL (ref 0.0–0.1)
Basophils Relative: 1 %
Eosinophils Absolute: 0.1 K/uL (ref 0.0–0.5)
Eosinophils Relative: 1 %
HCT: 51.4 % (ref 39.0–52.0)
Hemoglobin: 17.9 g/dL — ABNORMAL HIGH (ref 13.0–17.0)
Immature Granulocytes: 1 %
Lymphocytes Relative: 39 %
Lymphs Abs: 3.3 K/uL (ref 0.7–4.0)
MCH: 35.6 pg — ABNORMAL HIGH (ref 26.0–34.0)
MCHC: 34.8 g/dL (ref 30.0–36.0)
MCV: 102.2 fL — ABNORMAL HIGH (ref 80.0–100.0)
Monocytes Absolute: 0.7 K/uL (ref 0.1–1.0)
Monocytes Relative: 9 %
Neutro Abs: 4 K/uL (ref 1.7–7.7)
Neutrophils Relative %: 49 %
Platelets: 185 K/uL (ref 150–400)
RBC: 5.03 MIL/uL (ref 4.22–5.81)
RDW: 13.8 % (ref 11.5–15.5)
WBC: 8.3 K/uL (ref 4.0–10.5)
nRBC: 0 % (ref 0.0–0.2)

## 2024-05-02 LAB — COMPREHENSIVE METABOLIC PANEL WITH GFR
ALT: 30 U/L (ref 0–44)
AST: 73 U/L — ABNORMAL HIGH (ref 15–41)
Albumin: 3.7 g/dL (ref 3.5–5.0)
Alkaline Phosphatase: 95 U/L (ref 38–126)
Anion gap: 14 (ref 5–15)
BUN: 10 mg/dL (ref 6–20)
CO2: 29 mmol/L (ref 22–32)
Calcium: 8.6 mg/dL — ABNORMAL LOW (ref 8.9–10.3)
Chloride: 97 mmol/L — ABNORMAL LOW (ref 98–111)
Creatinine, Ser: 0.62 mg/dL (ref 0.61–1.24)
GFR, Estimated: >60 mL/min
Glucose, Bld: 90 mg/dL (ref 70–99)
Potassium: 3.2 mmol/L — ABNORMAL LOW (ref 3.5–5.1)
Sodium: 140 mmol/L (ref 135–145)
Total Bilirubin: 0.8 mg/dL (ref 0.0–1.2)
Total Protein: 6 g/dL — ABNORMAL LOW (ref 6.5–8.1)

## 2024-05-02 LAB — APTT: aPTT: 32 s (ref 24–36)

## 2024-05-02 LAB — BASIC METABOLIC PANEL WITH GFR
Anion gap: 15 (ref 5–15)
BUN: 10 mg/dL (ref 6–20)
CO2: 30 mmol/L (ref 22–32)
Calcium: 9.6 mg/dL (ref 8.9–10.3)
Chloride: 96 mmol/L — ABNORMAL LOW (ref 98–111)
Creatinine, Ser: 0.66 mg/dL (ref 0.61–1.24)
GFR, Estimated: >60 mL/min
Glucose, Bld: 105 mg/dL — ABNORMAL HIGH (ref 70–99)
Potassium: 2.7 mmol/L — CL (ref 3.5–5.1)
Sodium: 141 mmol/L (ref 135–145)

## 2024-05-02 LAB — URINE DRUG SCREEN
Amphetamines: NEGATIVE
Barbiturates: NEGATIVE
Benzodiazepines: NEGATIVE
Cocaine: NEGATIVE
Fentanyl: NEGATIVE
Methadone Scn, Ur: NEGATIVE
Opiates: NEGATIVE
Tetrahydrocannabinol: NEGATIVE

## 2024-05-02 LAB — PROTIME-INR
INR: 1 (ref 0.8–1.2)
Prothrombin Time: 13.5 s (ref 11.4–15.2)

## 2024-05-02 LAB — ETHANOL: Alcohol, Ethyl (B): 206 mg/dL — ABNORMAL HIGH

## 2024-05-02 LAB — TROPONIN T, HIGH SENSITIVITY
Troponin T High Sensitivity: <15 ng/L (ref 0–19)
Troponin T High Sensitivity: <15 ng/L (ref 0–19)

## 2024-05-02 LAB — D-DIMER, QUANTITATIVE: D-Dimer, Quant: 1.14 ug{FEU}/mL — ABNORMAL HIGH (ref 0.00–0.50)

## 2024-05-02 LAB — LIPASE, BLOOD: Lipase: 48 U/L (ref 11–51)

## 2024-05-02 MED ORDER — LORAZEPAM 2 MG/ML IJ SOLN: 1.0000 mg | INTRAMUSCULAR | Status: AC | PRN

## 2024-05-02 MED ORDER — SERTRALINE HCL 50 MG PO TABS
50.0000 mg | ORAL_TABLET | Freq: Every day | ORAL | Status: DC
  Administered 2024-05-03 – 2024-05-06 (×4): 50 mg via ORAL
  Filled 2024-05-02 (×4): qty 1

## 2024-05-02 MED ORDER — ENOXAPARIN SODIUM 40 MG/0.4ML IJ SOSY
40.0000 mg | PREFILLED_SYRINGE | INTRAMUSCULAR | Status: DC
  Administered 2024-05-03 – 2024-05-05 (×3): 40 mg via SUBCUTANEOUS
  Filled 2024-05-02 (×3): qty 0.4

## 2024-05-02 MED ORDER — LORAZEPAM 2 MG/ML IJ SOLN: 1.0000 mg | INTRAMUSCULAR | Status: DC | PRN

## 2024-05-02 MED ORDER — SODIUM CHLORIDE 0.9 % IV SOLN: INTRAVENOUS | Status: AC

## 2024-05-02 MED ORDER — STROKE: EARLY STAGES OF RECOVERY BOOK: Freq: Once | Status: DC

## 2024-05-02 MED ORDER — ADULT MULTIVITAMIN W/MINERALS CH: 1.0000 | ORAL_TABLET | Freq: Every day | ORAL | Status: DC

## 2024-05-02 MED ORDER — THIAMINE MONONITRATE 100 MG PO TABS: 100.0000 mg | ORAL_TABLET | Freq: Every day | ORAL | Status: DC

## 2024-05-02 MED ORDER — ASPIRIN 81 MG PO TBEC: 81.0000 mg | DELAYED_RELEASE_TABLET | Freq: Every day | ORAL | Status: DC

## 2024-05-02 MED ORDER — FOLIC ACID 1 MG PO TABS
1.0000 mg | ORAL_TABLET | Freq: Every day | ORAL | Status: DC
  Administered 2024-05-03 – 2024-05-06 (×5): 1 mg via ORAL
  Filled 2024-05-02 (×5): qty 1

## 2024-05-02 MED ORDER — ACETAMINOPHEN 650 MG RE SUPP: 650.0000 mg | RECTAL | Status: DC | PRN

## 2024-05-02 MED ORDER — POTASSIUM CHLORIDE CRYS ER 20 MEQ PO TBCR
20.0000 meq | EXTENDED_RELEASE_TABLET | Freq: Two times a day (BID) | ORAL | Status: DC
  Administered 2024-05-03 (×2): 20 meq via ORAL
  Filled 2024-05-02 (×2): qty 1

## 2024-05-02 MED ORDER — CLOPIDOGREL BISULFATE 75 MG PO TABS
75.0000 mg | ORAL_TABLET | Freq: Every day | ORAL | Status: DC
  Administered 2024-05-03 – 2024-05-05 (×3): 75 mg via ORAL
  Filled 2024-05-02 (×3): qty 1

## 2024-05-02 MED ORDER — STROKE: EARLY STAGES OF RECOVERY BOOK: Freq: Once | Status: AC

## 2024-05-02 MED ORDER — NICOTINE 21 MG/24HR TD PT24
21.0000 mg | MEDICATED_PATCH | Freq: Every day | TRANSDERMAL | Status: DC
  Administered 2024-05-03 – 2024-05-06 (×4): 21 mg via TRANSDERMAL
  Filled 2024-05-02 (×4): qty 1

## 2024-05-02 MED ORDER — ACETAMINOPHEN 160 MG/5ML PO SOLN: 650.0000 mg | ORAL | Status: DC | PRN

## 2024-05-02 MED ORDER — IPRATROPIUM-ALBUTEROL 0.5-2.5 (3) MG/3ML IN SOLN
3.0000 mL | Freq: Once | RESPIRATORY_TRACT | Status: AC
  Administered 2024-05-02: 3 mL via RESPIRATORY_TRACT
  Filled 2024-05-02: qty 3

## 2024-05-02 MED ORDER — IPRATROPIUM-ALBUTEROL 0.5-2.5 (3) MG/3ML IN SOLN: 3.0000 mL | Freq: Once | RESPIRATORY_TRACT | Status: DC

## 2024-05-02 MED ORDER — POTASSIUM CHLORIDE 10 MEQ/100ML IV SOLN
10.0000 meq | INTRAVENOUS | Status: AC
  Administered 2024-05-02 (×2): 10 meq via INTRAVENOUS
  Filled 2024-05-02 (×2): qty 100

## 2024-05-02 MED ORDER — METHYLPREDNISOLONE SODIUM SUCC 125 MG IJ SOLR
125.0000 mg | INTRAMUSCULAR | Status: AC
  Administered 2024-05-02: 125 mg via INTRAVENOUS
  Filled 2024-05-02: qty 2

## 2024-05-02 MED ORDER — ACETAMINOPHEN 325 MG PO TABS: 650.0000 mg | ORAL_TABLET | ORAL | Status: DC | PRN

## 2024-05-02 MED ORDER — LORAZEPAM 1 MG PO TABS: 1.0000 mg | ORAL_TABLET | ORAL | Status: DC | PRN

## 2024-05-02 MED ORDER — THIAMINE HCL 100 MG/ML IJ SOLN: 100.0000 mg | Freq: Every day | INTRAMUSCULAR | Status: DC

## 2024-05-02 MED ORDER — ASPIRIN 300 MG RE SUPP: 300.0000 mg | Freq: Every day | RECTAL | Status: DC

## 2024-05-02 MED ORDER — FOLIC ACID 1 MG PO TABS: 1.0000 mg | ORAL_TABLET | Freq: Every day | ORAL | Status: DC

## 2024-05-02 MED ORDER — IOHEXOL 350 MG/ML SOLN
100.0000 mL | Freq: Once | INTRAVENOUS | Status: AC | PRN
  Administered 2024-05-02: 100 mL via INTRAVENOUS

## 2024-05-02 MED ORDER — SODIUM CHLORIDE 0.9 % IV BOLUS
500.0000 mL | Freq: Once | INTRAVENOUS | Status: AC
  Administered 2024-05-02: 500 mL via INTRAVENOUS

## 2024-05-02 MED ORDER — LORAZEPAM 1 MG PO TABS: 1.0000 mg | ORAL_TABLET | ORAL | Status: AC | PRN

## 2024-05-02 NOTE — ED Provider Triage Note (Signed)
 Emergency Medicine Provider Triage Evaluation Note  Darrell Santiago , a 59 y.o. male  was evaluated in triage.  Pt complains of CP on the left side of the chest. Pain began 4-5 hours ago. Patient has had numbness on the right side and it is hard to light a cigarette. Pain is worse with deep breaths. Unsure if worse with exertion.  Review of Systems  Positive: CP, SOB,  Negative:   Physical Exam  There were no vitals taken for this visit. Gen:   Awake, tearful, very uncomfortable appearing Resp:  Normal effort  MSK:   Moves extremities without difficulty  Other:    Medical Decision Making  Medically screening exam initiated at 5:25 PM.  Appropriate orders placed.  Darrell Santiago was informed that the remainder of the evaluation will be completed by another provider, this initial triage assessment does not replace that evaluation, and the importance of remaining in the ED until their evaluation is complete.     Cleaster Tinnie LABOR, PA-C 05/02/24 1730

## 2024-05-02 NOTE — Assessment & Plan Note (Addendum)
 Alcohol-related dementia with agitation Patient drinks half pint liquor daily Folate thiamine  and multivitamin CIWA withdrawal protocol Delirium precautions

## 2024-05-02 NOTE — Assessment & Plan Note (Signed)
 DuoNebs, steroids, antitussives

## 2024-05-02 NOTE — Assessment & Plan Note (Signed)
 Holding antihypertensives for permissive hypertension during stroke workup
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
## 2024-05-02 NOTE — Assessment & Plan Note (Signed)
 Continue sertraline 
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
## 2024-05-02 NOTE — Assessment & Plan Note (Signed)
 History of alcohol induced pancreatitis Lipase normal, mild AST elevation to 73 No acute issues suspected

## 2024-05-02 NOTE — Assessment & Plan Note (Signed)
 Nicotine  patch

## 2024-05-02 NOTE — ED Notes (Signed)
 Code Stroke called to [facility] spoke with Graig Caldron

## 2024-05-02 NOTE — Assessment & Plan Note (Addendum)
 Self discontinued apixaban  December 2025 D-dimer was elevated at 1.14, CTA chest negative for PE Will hold off Eliquis  for now as patient will be on aspirin  and Plavix .

## 2024-05-02 NOTE — ED Notes (Addendum)
 Pt recently reporting difficulty using R arm. Pt able to move fingers minimally. Pt is a poor historian of medical history. Spouse at bedside. +ETOH onboard with pt, unknown amount.

## 2024-05-02 NOTE — ED Notes (Addendum)
 Stroke Code being activated at this time.

## 2024-05-02 NOTE — H&P (Signed)
 " History and Physical    Patient: Darrell Santiago FMW:[id number] DOB: June 23, 1965 DOA: 05/02/2024 DOS: the patient was seen and examined on 05/02/2024 PCP: Pcp, No  Patient coming from: Home  Chief Complaint:  Chief Complaint  Patient presents with   Chest Pain    HPI: Darrell Santiago is a 59 y.o. male with medical history significant for COPD, CAD, HTN, TIA, anxiety/depression, alcohol use disorder with complications of alcohol induced pancreatitis and alcohol related dementia, history of DVT/subsegmental PE 11/2023 who self discontinued anticoagulation December 2025, being admitted for stroke workup and COPD exacerbation.  History provided by wife at bedside.  She states patient complained of wheezing with chest tightness earlier in the day while she was at work and then later in the day, he noticed he was having difficulty left lifting his right arm and leg.  She advised him to call 911.  Stroke alert was activated upon his arrival to the ED. Vitals were within normal limits EKG showed sinus tachycardia at 104 with no acute ST-T wave changes CT head code stroke was negative and CTA head and neck showed no LVO. Patient was evaluated by teleneurology.  NIH SS was 6 however last known well was undetermined and as such he was outside tPA window. Teleneurology recommendations for admission for stroke workup. ACS workup in the ED was negative with no ischemia on EKG and troponin < 15 x 2 Other notable additional workup include the following: -Potassium 2.7 - D-dimer 1.14 with CTA chest negative for PE:- UDS negative - AST 73 - EtOH pending  Patient treated with an NS bolus, IV potassium, DuoNeb and Solu-Medrol , aspirin , folate thiamine  and multivitamin  Admission requested     Past Medical History:  Diagnosis Date   Abnormal EKG    Allergy    Seasonal   Anxiety    Chicken pox    COPD (chronic obstructive pulmonary disease) (HCC)    COVID-19    04/20/21   Depression    GERD  (gastroesophageal reflux disease)    Hyperlipidemia    Hypertension    Urine incontinence    Past Surgical History:  Procedure Laterality Date   CHOLECYSTECTOMY  2000   LEFT HEART CATH AND CORONARY ANGIOGRAPHY N/A 01/31/2017   Procedure: LEFT HEART CATH AND CORONARY ANGIOGRAPHY;  Surgeon: Fernand Denyse LABOR, MD;  Location: [facility] INVASIVE CV LAB;  Service: Cardiovascular;  Laterality: N/A;   Social History:  reports that he has been smoking cigarettes. He has a 26 pack-year smoking history. He has never used smokeless tobacco. He reports current alcohol use of about 21.0 standard drinks of alcohol per week. He reports that he does not use drugs.  Allergies[1]  Family History  Problem Relation Age of Onset   Alcohol abuse Father    Diabetes Father    Cancer Father        ?stage IV thyroid  with met   Cancer Maternal Grandfather        Colon Cancer    Prior to Admission medications  Medication Sig Start Date End Date Taking? Authorizing Provider  acetaminophen  (TYLENOL ) 325 MG tablet Take 2 tablets (650 mg total) by mouth every 6 (six) hours as needed for mild pain (or Fever >/= 101). 07/26/18  Yes Sherial Bail, MD  budesonide -formoterol  (SYMBICORT ) 160-4.5 MCG/ACT inhaler Inhale 2 puffs into the lungs 2 (two) times daily. Rinse mouth after use 08/09/22  Yes Hope Merle, MD  cetirizine  (ZYRTEC  ALLERGY) 10 MG tablet Take 1 tablet (10 mg  total) by mouth daily. 11/27/22  Yes Hawks, Christy A, FNP  hydrOXYzine  (ATARAX ) 25 MG tablet Take 1 tablet (25 mg total) by mouth daily as needed. 08/09/22  Yes Hope Merle, MD  ipratropium-albuterol  (DUONEB) 0.5-2.5 (3) MG/3ML SOLN Inhale 3 mLs into the lungs every 6 (six) hours as needed. 08/09/22  Yes Hope Merle, MD  magnesium  oxide (MAG-OX) 400 (240 Mg) MG tablet Take 1 tablet (400 mg total) by mouth 2 (two) times daily. 12/21/23  Yes Laurita Pillion, MD  Multiple Vitamin (MULTIVITAMIN) tablet Take 1 tablet by mouth daily.   Yes [provider]   potassium chloride  SA (KLOR-CON  M) 20 MEQ tablet Take 1 tablet (20 mEq total) by mouth 2 (two) times daily. 12/21/23  Yes Laurita Pillion, MD  sertraline  (ZOLOFT ) 50 MG tablet Take 1 tablet (50 mg total) by mouth daily. 11/11/23 11/10/24 Yes Jacolyn Pae, MD  apixaban  (ELIQUIS ) 5 MG TABS tablet Take 2 tablets (10 mg total) by mouth 2 (two) times daily for 5 days, THEN 1 tablet (5 mg total) 2 (two) times daily. 12/21/23 02/24/24  Laurita Pillion, MD  fluticasone  (FLONASE ) 50 MCG/ACT nasal spray Place 2 sprays into both nostrils daily. Patient not taking: Reported on 05/02/2024 11/27/22   Lavell Lye A, FNP  nicotine  (NICODERM CQ  - DOSED IN MG/24 HOURS) 21 mg/24hr patch Place 1 patch (21 mg total) onto the skin daily. Patient not taking: Reported on 05/02/2024 12/22/23   Laurita Pillion, MD  ondansetron  (ZOFRAN -ODT) 4 MG disintegrating tablet Take 1 tablet (4 mg total) by mouth every 8 (eight) hours as needed for nausea or vomiting. Patient not taking: Reported on 05/02/2024 12/17/23   Lavell Lye LABOR, FNP    Physical Exam: Vitals:   05/02/24 1729 05/02/24 1920 05/02/24 1940 05/02/24 2059  BP: 109/63   120/84  Pulse: 99 69 81 76  Resp: 18 20 17 18   Temp: 98.2 F (36.8 C)   98.1 F (36.7 C)  TempSrc: Oral   Oral  SpO2: 95% 91% 97% 100%   Physical Exam Vitals and nursing note reviewed.  Constitutional:      General: He is sleeping. He is not in acute distress. HENT:     Head: Normocephalic and atraumatic.  Cardiovascular:     Rate and Rhythm: Normal rate and regular rhythm.     Heart sounds: Normal heart sounds.  Pulmonary:     Effort: Pulmonary effort is normal.     Breath sounds: Normal breath sounds.  Abdominal:     Palpations: Abdomen is soft.     Tenderness: There is no abdominal tenderness.  Neurological:     General: No focal deficit present.     Mental Status: He is easily aroused. Mental status is at baseline.     Labs on Admission: I have personally reviewed following labs and  imaging studies  CBC: Recent Labs  Lab 05/02/24 1743  WBC 8.3  NEUTROABS 4.0  HGB 17.9*  HCT 51.4  MCV 102.2*  PLT 185   Basic Metabolic Panel: Recent Labs  Lab 05/02/24 1743 05/02/24 1953  NA 141 140  K 2.7* 3.2*  CL 96* 97*  CO2 30 29  GLUCOSE 105* 90  BUN 10 10  CREATININE 0.66 0.62  CALCIUM  9.6 8.6*   GFR: CrCl cannot be calculated (Unknown ideal weight.). Liver Function Tests: Recent Labs  Lab 05/02/24 1953  AST 73*  ALT 30  ALKPHOS 95  BILITOT 0.8  PROT 6.0*  ALBUMIN 3.7   Recent Labs  Lab 05/02/24 1743  LIPASE 48   No results for input(s): AMMONIA in the last 168 hours. Coagulation Profile: Recent Labs  Lab 05/02/24 1953  INR 1.0   Cardiac Enzymes: No results for input(s): CKTOTAL, CKMB, CKMBINDEX, TROPONINI in the last 168 hours. BNP (last 3 results) No results for input(s): PROBNP in the last 8760 hours. HbA1C: No results for input(s): HGBA1C in the last 72 hours. CBG: No results for input(s): GLUCAP in the last 168 hours. Lipid Profile: No results for input(s): CHOL, HDL, LDLCALC, TRIG, CHOLHDL, LDLDIRECT in the last 72 hours. Thyroid  Function Tests: No results for input(s): TSH, T4TOTAL, FREET4, T3FREE, THYROIDAB in the last 72 hours. Anemia Panel: No results for input(s): VITAMINB12, FOLATE, FERRITIN, TIBC, IRON, RETICCTPCT in the last 72 hours. Urine analysis:    Component Value Date/Time   COLORURINE AMBER (A) 12/18/2023 0200   APPEARANCEUR HAZY (A) 12/18/2023 0200   LABSPEC >1.046 (H) 12/18/2023 0200   PHURINE 6.0 12/18/2023 0200   GLUCOSEU NEGATIVE 12/18/2023 0200   GLUCOSEU NEGATIVE 06/05/2017 0852   HGBUR NEGATIVE 12/18/2023 0200   BILIRUBINUR NEGATIVE 12/18/2023 0200   KETONESUR NEGATIVE 12/18/2023 0200   PROTEINUR 100 (A) 12/18/2023 0200   UROBILINOGEN 1.0 06/05/2017 0852   NITRITE NEGATIVE 12/18/2023 0200   LEUKOCYTESUR NEGATIVE 12/18/2023 0200    Radiological  Exams on Admission: CT ANGIO HEAD NECK W WO CM (CODE STROKE) Result Date: 05/02/2024 CLINICAL DATA:  Initial evaluation for acute neuro deficit, stroke suspected. EXAM: CT ANGIOGRAPHY HEAD AND NECK WITH AND WITHOUT CONTRAST TECHNIQUE: Multidetector CT imaging of the head and neck was performed using the standard protocol during bolus administration of intravenous contrast. Multiplanar CT image reconstructions and MIPs were obtained to evaluate the vascular anatomy. Carotid stenosis measurements (when applicable) are obtained utilizing NASCET criteria, using the distal internal carotid diameter as the denominator. RADIATION DOSE REDUCTION: This exam was performed according to the departmental dose-optimization program which includes automated exposure control, adjustment of the mA and/or kV according to patient size and/or use of iterative reconstruction technique. CONTRAST:  OMNIPAQUE  IOHEXOL  350 MG/ML SOLN COMPARISON:  Comparison made with CT from earlier the same day. FINDINGS: CTA NECK FINDINGS Aortic arch: Aortic arch within normal limits for caliber standard 3 vessel morphology. Mild aortic atherosclerosis. No stenosis about the origin the great vessels. Right carotid system: No evidence of dissection, stenosis (50% or greater), or occlusion. Left carotid system: No evidence of dissection, stenosis (50% or greater), or occlusion. Vertebral arteries: Codominant. No evidence of dissection, stenosis (50% or greater), or occlusion. Skeleton: No worrisome osseous lesions. Mild for age cervical spondylosis. Poor dentition with multiple dental caries noted. Other neck: 1.1 cm sebaceous cyst noted at the left face (series 6, image 215). 1.2 cm right thyroid  nodule, of doubtful significance given size and patient age, no follow-up imaging recommended (ref: J Am Coll Radiol. 2015 Feb;12(2): 143-50).No other acute finding. Upper chest: Better evaluated on concomitant CT of the chest. Review of the MIP images  confirms the above findings CTA HEAD FINDINGS Anterior circulation: Both internal carotid arteries widely patent to the termini without stenosis. A1 segments widely patent. Normal anterior communicating artery complex. Both anterior cerebral arteries widely patent to their distal aspects without stenosis. No M1 stenosis or occlusion. Normal MCA bifurcations. Distal MCA branches well perfused and symmetric. Posterior circulation: Both V4 segments patent to the vertebrobasilar junction without stenosis. Both PICA origins patent and normal. Basilar widely patent to its distal aspect without stenosis. Superior cerebellar arteries patent  bilaterally. Both PCAs primarily supplied via the basilar and are well perfused to there distal aspects. Venous sinuses: Grossly patent allowing for timing the contrast bolus. Anatomic variants: None significant.  No aneurysm. Review of the MIP images confirms the above findings IMPRESSION: 1. Negative CTA of the head and neck. No large vessel occlusion or other emergent finding. No hemodynamically significant or correctable stenosis. 2. Poor dentition with multiple dental caries. Aortic Atherosclerosis (ICD10-I70.0). These results were communicated to Dr. Jerri at 8:00 pm on 05/02/2024 by text page via the [facility] messaging system. Electronically Signed   By: Morene Hoard M.D.   On: 05/02/2024 20:06   CT Angio Chest PE W and/or Wo Contrast Result Date: 05/02/2024 CLINICAL DATA:  Chest pain EXAM: CT ANGIOGRAPHY CHEST WITH CONTRAST TECHNIQUE: Multidetector CT imaging of the chest was performed using the standard protocol during bolus administration of intravenous contrast. Multiplanar CT image reconstructions and MIPs were obtained to evaluate the vascular anatomy. RADIATION DOSE REDUCTION: This exam was performed according to the departmental dose-optimization program which includes automated exposure control, adjustment of the mA and/or kV according to patient size and/or use of  iterative reconstruction technique. CONTRAST:  OMNIPAQUE  IOHEXOL  350 MG/ML SOLN COMPARISON:  Chest x-ray 05/02/2024, chest CT 12/17/2023 FINDINGS: Cardiovascular: Satisfactory opacification of the pulmonary arteries to the segmental level. No evidence of pulmonary embolism. Normal heart size. No pericardial effusion. Mild atherosclerosis. No aneurysm. Mild coronary vascular calcification. Normal cardiac size. No pericardial effusion Mediastinum/Nodes: Patent trachea. No thyroid  mass. No suspicious lymph nodes. Esophagus within normal limits. Lungs/Pleura: No acute airspace disease, pleural effusion or pneumothorax. Upper Abdomen: No acute finding. Hepatic steatosis. Calcifications incompletely visualized at the pancreatic head suggesting chronic pancreatitis. Musculoskeletal: No acute or suspicious osseous abnormality. Review of the MIP images confirms the above findings. IMPRESSION: 1. Negative for acute pulmonary embolus or aortic dissection. Clear lung fields. 2. Hepatic steatosis. 3. Aortic atherosclerosis. Aortic Atherosclerosis (ICD10-I70.0). Electronically Signed   By: Luke Bun M.D.   On: 05/02/2024 19:57   CT HEAD CODE STROKE WO CONTRAST (LKW 0-4.5h, LVO 0-24h) Result Date: 05/02/2024 CLINICAL DATA:  Code stroke. EXAM: CT HEAD WITHOUT CONTRAST TECHNIQUE: Contiguous axial images were obtained from the base of the skull through the vertex without intravenous contrast. RADIATION DOSE REDUCTION: This exam was performed according to the departmental dose-optimization program which includes automated exposure control, adjustment of the mA and/or kV according to patient size and/or use of iterative reconstruction technique. COMPARISON:  Prior study from 06/30/2022. FINDINGS: Brain: Cerebral volume within normal limits. No acute intracranial hemorrhage. No acute large vessel territory infarct. No mass lesion or midline shift. No hydrocephalus or extra-axial fluid collection. Vascular: No abnormal  hyperdense vessel. Skull: Scalp soft tissues within normal limits.  Calvarium intact. Sinuses/Orbits: Globes orbital soft tissues within normal limits. Mild-to-moderate mucosal thickening present about the maxillary sinuses, with mild mucosal thickening about the ethmoidal air cells. Mastoid air cells are clear. Other: None. ASPECTS [facility] Stroke Program Early CT Score) - Ganglionic level infarction (caudate, lentiform nuclei, internal capsule, insula, M1-M3 cortex): 7 - Supraganglionic infarction (M4-M6 cortex): 3 Total score (0-10 with 10 being normal): 10 IMPRESSION: 1. Negative head CT.  No acute intracranial abnormality. 2. ASPECTS is 10. These results were communicated to Dr. Jerri at 7:23 pm on 05/02/2024 by text page via the [facility] messaging system. Electronically Signed   By: Morene Hoard M.D.   On: 05/02/2024 19:24   DG Chest 2 View Result Date: 05/02/2024 CLINICAL DATA:  Chest  pain EXAM: CHEST - 2 VIEW COMPARISON:  12/17/2023 FINDINGS: The heart size and mediastinal contours are within normal limits. Both lungs are clear. The visualized skeletal structures are unremarkable. IMPRESSION: No active cardiopulmonary disease. Electronically Signed   By: Luke Bun M.D.   On: 05/02/2024 18:01   Data Reviewed for HPI: Relevant notes from primary care and specialist visits, past discharge summaries as available in EHR, including Care Everywhere. Prior diagnostic testing as pertinent to current admission diagnoses Updated medications and problem lists for reconciliation ED course, including vitals, labs, imaging, treatment and response to treatment Triage notes, nursing and pharmacy notes and ED provider's notes Notable results as noted above in HPI      Assessment and Plan: * Acute left-sided weakness, possible TIA/CVA History of TIA Permissive hypertension for first 24-48 hrs post stroke onset: Prn Labetalol IV or Vasotec IV If BP greater than 220/120  Statins for LDL goal less than  70 ASA 81mg  daily, Plavix  75mg  daily x 3 weeks then monotherapy thereafter Telemetry, echo, MRI-->negate for stroke Avoid dextrose  containing fluids, Maintain euglycemia, euthermia Neuro checks q4 hrs x 24 hrs and then per shift Head of bed 30 degrees Physical therapy/Occupational therapy/Speech therapy if failed dysphagia screen Consider neurology consult   COPD with acute exacerbation (HCC) DuoNebs, steroids, antitussives  CAD (coronary artery disease) Chest pain Chest pain suspect noncardiac and related to COPD exacerbation.   EKG nonacute and troponin < 15 x 2 History of normal left heart cath in 2018 Continue aspirin   Hypokalemia Received IV potassium in the ED Pharmacy consult for electrolyte management  History of DVT/pulmonary embolus 11/2023 (PE) Self discontinued apixaban  December 2025 D-dimer was elevated at 1.14, CTA chest negative for PE Will hold off Eliquis  for now as patient will be on aspirin  and Plavix .  Alcohol use disorder Alcohol-related dementia with agitation Patient drinks half pint liquor daily Folate thiamine  and multivitamin CIWA withdrawal protocol Delirium precautions  Elevated liver enzymes History of alcohol induced pancreatitis Lipase normal, mild AST elevation to 73 No acute issues suspected  HTN (hypertension) Holding antihypertensives for permissive hypertension during stroke workup  Anxiety and depression Continue sertraline   Tobacco use disorder Nicotine  patch    DVT prophylaxis: Lovenox   Consults: none  Advance Care Planning:   Code Status: Prior   Family Communication: wife at bedside  Disposition Plan: Back to previous home environment  Severity of Illness: The appropriate patient status for this patient is OBSERVATION. Observation status is judged to be reasonable and necessary in order to provide the required intensity of service to ensure the patient's safety. The patient's presenting symptoms, physical exam  findings, and initial radiographic and laboratory data in the context of their medical condition is felt to place them at decreased risk for further clinical deterioration. Furthermore, it is anticipated that the patient will be medically stable for discharge from the hospital within 2 midnights of admission.   Author: Delayne LULLA Solian, MD 05/02/2024 11:04 PM  For on call review [email or web address].      [1] No Known Allergies  "

## 2024-05-02 NOTE — ED Notes (Signed)
 Pt taken to CT scan at this time with primary RN. Telestroke cart brought over by [employer]. Assessment in progress.

## 2024-05-02 NOTE — Assessment & Plan Note (Addendum)
 History of TIA Permissive hypertension for first 24-48 hrs post stroke onset: Prn Labetalol IV or Vasotec IV If BP greater than 220/120  Statins for LDL goal less than 70 ASA 81mg  daily, Plavix  75mg  daily x 3 weeks then monotherapy thereafter Telemetry, echo, MRI-->negate for stroke Avoid dextrose  containing fluids, Maintain euglycemia, euthermia Neuro checks q4 hrs x 24 hrs and then per shift Head of bed 30 degrees Physical therapy/Occupational therapy/Speech therapy if failed dysphagia screen Consider neurology consult

## 2024-05-02 NOTE — Assessment & Plan Note (Addendum)
 Chest pain Chest pain suspect noncardiac and related to COPD exacerbation.   EKG nonacute and troponin < 15 x 2 History of normal left heart cath in 2018 Continue aspirin 
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
## 2024-05-02 NOTE — Assessment & Plan Note (Signed)
 Received IV potassium in the ED Pharmacy consult for electrolyte management

## 2024-05-03 ENCOUNTER — Observation Stay
Admit: 2024-05-03 | Discharge: 2024-05-03 | Disposition: A | Discharge status: Home / Self Care | Payer: Self-pay | Attending: Internal Medicine | Admitting: Internal Medicine

## 2024-05-03 DIAGNOSIS — J441 Chronic obstructive pulmonary disease with (acute) exacerbation: Secondary | ICD-10-CM

## 2024-05-03 DIAGNOSIS — F1090 Alcohol use, unspecified, uncomplicated: Secondary | ICD-10-CM

## 2024-05-03 DIAGNOSIS — E876 Hypokalemia: Secondary | ICD-10-CM

## 2024-05-03 LAB — CBC
HCT: 43.1 % (ref 39.0–52.0)
Hemoglobin: 15.2 g/dL (ref 13.0–17.0)
MCH: 35.2 pg — ABNORMAL HIGH (ref 26.0–34.0)
MCHC: 35.3 g/dL (ref 30.0–36.0)
MCV: 99.8 fL (ref 80.0–100.0)
Platelets: 142 K/uL — ABNORMAL LOW (ref 150–400)
RBC: 4.32 MIL/uL (ref 4.22–5.81)
RDW: 13.2 % (ref 11.5–15.5)
WBC: 3.3 K/uL — ABNORMAL LOW (ref 4.0–10.5)
nRBC: 0 % (ref 0.0–0.2)

## 2024-05-03 LAB — BASIC METABOLIC PANEL WITH GFR
Anion gap: 13 (ref 5–15)
BUN: 10 mg/dL (ref 6–20)
CO2: 25 mmol/L (ref 22–32)
Calcium: 9.3 mg/dL (ref 8.9–10.3)
Chloride: 100 mmol/L (ref 98–111)
Creatinine, Ser: 0.61 mg/dL (ref 0.61–1.24)
GFR, Estimated: >60 mL/min
Glucose, Bld: 217 mg/dL — ABNORMAL HIGH (ref 70–99)
Potassium: 2.6 mmol/L — CL (ref 3.5–5.1)
Sodium: 138 mmol/L (ref 135–145)

## 2024-05-03 LAB — ECHOCARDIOGRAM COMPLETE
Area-P 1/2: 3.93 cm2
Height: 69 in
S' Lateral: 2.8 cm
Weight: 2543.23 [oz_av]

## 2024-05-03 LAB — LIPID PANEL
Cholesterol: 189 mg/dL (ref 0–200)
HDL: 95 mg/dL
LDL Cholesterol: 84 mg/dL (ref 0–99)
Total CHOL/HDL Ratio: 2 ratio
Triglycerides: 48 mg/dL
VLDL: 10 mg/dL (ref 0–40)

## 2024-05-03 LAB — HEMOGLOBIN A1C
Hgb A1c MFr Bld: 5.5 % (ref 4.8–5.6)
Mean Plasma Glucose: 111.15 mg/dL

## 2024-05-03 LAB — PHOSPHORUS: Phosphorus: 1.3 mg/dL — ABNORMAL LOW (ref 2.5–4.6)

## 2024-05-03 LAB — MAGNESIUM: Magnesium: 1.1 mg/dL — ABNORMAL LOW (ref 1.7–2.4)

## 2024-05-03 LAB — POTASSIUM: Potassium: 3.1 mmol/L — ABNORMAL LOW (ref 3.5–5.1)

## 2024-05-03 LAB — C-REACTIVE PROTEIN: CRP: <0.5 mg/dL

## 2024-05-03 MED ORDER — POTASSIUM CHLORIDE 10 MEQ/100ML IV SOLN
10.0000 meq | INTRAVENOUS | Status: AC
  Administered 2024-05-03 (×4): 10 meq via INTRAVENOUS
  Filled 2024-05-03 (×2): qty 100

## 2024-05-03 MED ORDER — IPRATROPIUM-ALBUTEROL 0.5-2.5 (3) MG/3ML IN SOLN: 3.0000 mL | RESPIRATORY_TRACT | Status: DC | PRN

## 2024-05-03 MED ORDER — ASPIRIN 81 MG PO TBEC
81.0000 mg | DELAYED_RELEASE_TABLET | Freq: Every day | ORAL | Status: DC
  Administered 2024-05-03 – 2024-05-05 (×3): 81 mg via ORAL
  Filled 2024-05-03 (×3): qty 1

## 2024-05-03 MED ORDER — K PHOS MONO-SOD PHOS DI & MONO 155-852-130 MG PO TABS
500.0000 mg | ORAL_TABLET | Freq: Four times a day (QID) | ORAL | Status: AC
  Administered 2024-05-03 (×4): 500 mg via ORAL
  Filled 2024-05-03 (×4): qty 2

## 2024-05-03 MED ORDER — HYDROXYZINE HCL 25 MG PO TABS
25.0000 mg | ORAL_TABLET | Freq: Once | ORAL | Status: AC
  Administered 2024-05-03: 25 mg via ORAL
  Filled 2024-05-03: qty 1

## 2024-05-03 MED ORDER — MAGNESIUM SULFATE 4 GM/100ML IV SOLN
4.0000 g | Freq: Once | INTRAVENOUS | Status: AC
  Administered 2024-05-03: 4 g via INTRAVENOUS
  Filled 2024-05-03: qty 100

## 2024-05-03 MED ORDER — ASPIRIN 325 MG PO TABS
325.0000 mg | ORAL_TABLET | Freq: Once | ORAL | Status: AC
  Administered 2024-05-03: 325 mg via ORAL
  Filled 2024-05-03: qty 1

## 2024-05-03 MED ORDER — POTASSIUM CHLORIDE CRYS ER 20 MEQ PO TBCR
40.0000 meq | EXTENDED_RELEASE_TABLET | Freq: Once | ORAL | Status: AC
  Administered 2024-05-04: 40 meq via ORAL
  Filled 2024-05-03: qty 2

## 2024-05-03 MED ORDER — FLUTICASONE FUROATE-VILANTEROL 100-25 MCG/ACT IN AEPB
1.0000 | INHALATION_SPRAY | Freq: Every day | RESPIRATORY_TRACT | Status: DC
  Administered 2024-05-03 – 2024-05-06 (×4): 1 via RESPIRATORY_TRACT
  Filled 2024-05-03: qty 28

## 2024-05-03 MED ORDER — POTASSIUM CHLORIDE CRYS ER 20 MEQ PO TBCR
40.0000 meq | EXTENDED_RELEASE_TABLET | Freq: Once | ORAL | Status: AC
  Administered 2024-05-03: 40 meq via ORAL
  Filled 2024-05-03: qty 2

## 2024-05-03 MED ORDER — PREDNISONE 20 MG PO TABS
40.0000 mg | ORAL_TABLET | Freq: Every day | ORAL | Status: DC
  Administered 2024-05-03 – 2024-05-06 (×4): 40 mg via ORAL
  Filled 2024-05-03 (×4): qty 2

## 2024-05-03 NOTE — Consult Note (Signed)
 PHARMACY CONSULT NOTE - ELECTROLYTES  Pharmacy Consult for Electrolyte Monitoring and Replacement   Recent Labs: Height: 5' 9 (175.3 cm) Weight: 72.1 kg (158 lb 15.2 oz) IBW/kg (Calculated) : 70.7 Estimated Creatinine Clearance: 103.1 mL/min (by C-G formula based on SCr of 0.61 mg/dL). Potassium (mmol/L)  Date Value  05/03/2024 2.6 (LL)   Magnesium  (mg/dL)  Date Value  98/83/7973 1.1 (L)   Calcium  (mg/dL)  Date Value  98/83/7973 9.3   Albumin (g/dL)  Date Value  98/84/7973 3.7   Phosphorus (mg/dL)  Date Value  98/83/7973 1.3 (L)   Sodium (mmol/L)  Date Value  05/03/2024 138    Assessment  Darrell Santiago is a 59 y.o. male presenting with acute left sided weakness concerning for possible TIA/CVA. PMH significant for TIA, . Pharmacy has been consulted to monitor and replace electrolytes.  Diet: heart healthy MIVF: NS @ 40 mL/hr Pertinent medications: n/a  Goal of Therapy: Electrolytes WNL  Plan:  K = 2.6, patient already received Kcl 20 mEq po x 1, will add additional orders for Kcl 40 mEq po x 1 and Kcl 10 mEq IV x 4 doses Mg = 1.1, give MagSulf 4g IV x 1 Phos = 1.3, Give Kphos Neutral 500 mg po x 4 doses Recheck K 1/16 @ 2000 Check BMP, Mg, Phos with AM labs  Thank you for allowing pharmacy to be a part of this patient's care.  Kayla JULIANNA Blew, PharmD Clinical Pharmacist 05/03/2024 8:39 AM

## 2024-05-03 NOTE — Consult Note (Signed)
 PHARMACY CONSULT NOTE - ELECTROLYTES  Pharmacy Consult for Electrolyte Monitoring and Replacement   Recent Labs: Height: 5' 9 (175.3 cm) Weight: 72.1 kg (158 lb 15.2 oz) IBW/kg (Calculated) : 70.7 Estimated Creatinine Clearance: 103.1 mL/min (by C-G formula based on SCr of 0.61 mg/dL). Potassium (mmol/L)  Date Value  05/03/2024 3.1 (L)   Magnesium  (mg/dL)  Date Value  98/83/7973 1.1 (L)   Calcium  (mg/dL)  Date Value  98/83/7973 9.3   Albumin (g/dL)  Date Value  98/84/7973 3.7   Phosphorus (mg/dL)  Date Value  98/83/7973 1.3 (L)   Sodium (mmol/L)  Date Value  05/03/2024 138    Assessment  PENIEL HASS is a 59 y.o. male presenting with acute left sided weakness concerning for possible TIA/CVA. PMH significant for TIA, . Pharmacy has been consulted to monitor and replace electrolytes.  1/16 @ 2203 K 3.1  Diet: heart healthy MIVF: NS @ 40 mL/hr Pertinent medications: n/a  Goal of Therapy: Electrolytes WNL  Plan:  KCl 40 meq po x 1 Check BMP, Mg, Phos with AM labs  Thank you for allowing pharmacy to be a part of this patient's care.  Bari Hamilton D, PharmD Clinical Pharmacist 05/03/2024 10:02 PM

## 2024-05-03 NOTE — Plan of Care (Signed)
  Problem: Clinical Measurements: Goal: Cardiovascular complication will be avoided Outcome: Progressing   Problem: Activity: Goal: Risk for activity intolerance will decrease Outcome: Progressing   Problem: Nutrition: Goal: Adequate nutrition will be maintained Outcome: Progressing   Problem: Safety: Goal: Ability to remain free from injury will improve Outcome: Progressing

## 2024-05-03 NOTE — Progress Notes (Signed)
 SLP Cancellation Note  Patient Details Name: Darrell Santiago MRN: [id number] DOB: 08-03-1965   Cancelled treatment:       Reason Eval/Treat Not Completed: SLP screened, no needs identified, will sign off (chart reviewed; consulted NSG and met w/ pt in room.)   Pt is a 59 y/o male presented to ED on 05/02/24 for SOB and L sided chest pain along with R arm numbness. Admitted for stroke workup and COPD exacerbation. MRI negative. PMH: COPD, CAD, HTN, TIA, anxiety/depression, Alcohol use disorder with Alcohol induced pancreatitis and Alcohol related Dementia -- per chart, he reports current alcohol use of about 21.0 standard drinks of alcohol per week; 1/2 pint.  Pt denied any difficulty swallowing and is currently on a regular diet; eating his sandwich/lunch while in room. He tolerates swallowing pills w/ water per NSG. Pt conversed in full conversation w/out expressive/receptive deficits noted; pt denied any speech-language deficits. Speech fully intelligible and clear. No reports of slurred speech per NSG. pt.  No further skilled ST services indicated as pt appears at his baseline. Pt agreed. NSG to reconsult if any change in status while admitted.      Comer Portugal, MS, CCC-SLP Speech Language Pathologist Rehab Services; [facility] [phone] (ascom) Joella Saefong 05/03/2024, 1:53 PM

## 2024-05-03 NOTE — Evaluation (Signed)
 Occupational Therapy Evaluation Patient Details Name: Darrell Santiago MRN: [id number] DOB: [birth year] Today's Date: 05/03/2024   History of Present Illness   60 y/o male presented to ED on 05/02/24 for SOB and L sided chest pain along with R arm numbness. Admitted for stroke workup and COPD exacerbation. MRI negative. PMH: COPD, CAD, HTN, TIA, anxiety/depression, alcohol use disorder with alcohol induced pancreatitis and alcohol related dementia, hx of DVT/subsegmental PE 11/2023.     Clinical Impressions Patient presenting with decreased Ind in self care,balance, functional mobility, transfers, endurance, and safety awareness. Patient reports being Ind at baseline and lives with sister.Patient currently functioning at [facility]- min A and +2 for functional mobility for safety awareness needs. Pt ambulates with RW this session but quickly reports feeling fatigued and dizzy and needing to return to room. BP obtained with no significant drops which would lead us  to believe pt is having orthostatic hypotensive episodes this session.  Call bell and all needed items within reach. Patient will benefit from acute OT to increase overall independence in the areas of ADLs, functional mobility, and safety awareness in order to safely discharge.     If plan is discharge home, recommend the following:   A little help with walking and/or transfers;A little help with bathing/dressing/bathroom     Functional Status Assessment   Patient has had a recent decline in their functional status and demonstrates the ability to make significant improvements in function in a reasonable and predictable amount of time.     Equipment Recommendations   Other (comment) (2WW)      Precautions/Restrictions   Precautions Precautions: Fall Recall of Precautions/Restrictions: Intact     Mobility Bed Mobility Overal bed mobility: Needs Assistance Bed Mobility: Supine to Sit, Sit to Supine     Supine to sit:  Supervision Sit to supine: Supervision        Transfers Overall transfer level: Needs assistance Equipment used: Rolling walker (2 wheels) Transfers: Sit to/from Stand Sit to Stand: Min assist                  Balance Overall balance assessment: Needs assistance Sitting-balance support: Bilateral upper extremity supported Sitting balance-Leahy Scale: Fair     Standing balance support: Bilateral upper extremity supported, Reliant on assistive device for balance Standing balance-Leahy Scale: Poor                             ADL either performed or assessed with clinical judgement   ADL Overall ADL's : Needs assistance/impaired                         Toilet Transfer: Minimal assistance;Rolling walker (2 wheels) Toilet Transfer Details (indicate cue type and reason): simulated                 Vision Patient Visual Report: No change from baseline              Pertinent Vitals/Pain Pain Assessment Pain Assessment: Faces Faces Pain Scale: Hurts little more Pain Location: R shoulder Pain Descriptors / Indicators: Discomfort Pain Intervention(s): Limited activity within patient's tolerance, Monitored during session, Repositioned     Extremity/Trunk Assessment Upper Extremity Assessment Upper Extremity Assessment: Generalized weakness;RUE deficits/detail RUE Deficits / Details: Pt endorsing some numbness in R shoulder but AROM and strength WFLs   Lower Extremity Assessment Lower Extremity Assessment: Generalized weakness       Communication Communication  Communication: No apparent difficulties   Cognition Arousal: Alert Behavior During Therapy: WFL for tasks assessed/performed Cognition: No apparent impairments                               Following commands: Intact                  Home Living Family/patient expects to be discharged to:: Private residence Living Arrangements: Spouse/significant  other Available Help at Discharge: Family;Available 24 hours/day Type of Home: House Home Access: Level entry     Home Layout: One level     Bathroom Shower/Tub: Producer, Television/film/video: Standard     Home Equipment: Cane - single point          Prior Functioning/Environment Prior Level of Function : Independent/Modified Independent;Driving               ADLs Comments: Pt's job recently left the area. Ind with self care and mobility tasks    OT Problem List: Decreased strength;Decreased activity tolerance;Impaired balance (sitting and/or standing);Decreased safety awareness;Decreased knowledge of precautions   OT Treatment/Interventions: Self-care/ADL training;Therapeutic exercise;Therapeutic activities;Patient/family education;Balance training      OT Goals(Current goals can be found in the care plan section)   Acute Rehab OT Goals Patient Stated Goal: to go home OT Goal Formulation: With patient Time For Goal Achievement: 05/17/24 Potential to Achieve Goals: Fair ADL Goals Pt Will Perform Grooming: with modified independence;standing Pt Will Perform Lower Body Dressing: with modified independence;sit to/from stand Pt Will Transfer to Toilet: with modified independence;ambulating Pt Will Perform Toileting - Clothing Manipulation and hygiene: with modified independence;sit to/from stand   OT Frequency:  Min 2X/week       AM-PAC OT 6 Clicks Daily Activity     Outcome Measure Help from another person eating meals?: None Help from another person taking care of personal grooming?: None Help from another person toileting, which includes using toliet, bedpan, or urinal?: A Little Help from another person bathing (including washing, rinsing, drying)?: A Little Help from another person to put on and taking off regular upper body clothing?: None Help from another person to put on and taking off regular lower body clothing?: A Little 6 Click Score: 21    End of Session Equipment Utilized During Treatment: Rolling walker (2 wheels) Nurse Communication: Mobility status  Activity Tolerance: Patient tolerated treatment well;Patient limited by fatigue Patient left: in bed;with call bell/phone within reach;with bed alarm set  OT Visit Diagnosis: Unsteadiness on feet (R26.81);Repeated falls (R29.6);Muscle weakness (generalized) (M62.81)                Time: 9059-9042 OT Time Calculation (min): 17 min Charges:  OT General Charges $OT Visit: 1 Visit OT Evaluation $OT Eval Moderate Complexity: 1 [address] [place], OTR/L , CBIS ascom [phone]  05/03/24, 3:04 PM

## 2024-05-03 NOTE — Consult Note (Signed)
 "    PULMONOLOGY         Date: 05/03/2024,   MRN# [id number] Darrell Santiago October 26, 1965     AdmissionWeight: 72.1 kg                 CurrentWeight: 72.1 kg  Referring provider: Al-Sultani   CHIEF COMPLAINT:      HISTORY OF PRESENT ILLNESS   This is a pleasant 59 year old with a history of seasonal allergies, COPD, major depressive disorder essential hypertension, urinary incontinence, dyslipidemia, GERD, history of COVID-19 in 2023, anxiety disorder, history of chickenpox, alcohol induced pancreatitis, alcoholic related dementia, history of subsegmental PE in August 2025 with noncompliance medication who came in with chief complaint of respiratory distress wheezing and loud breathing.  Apparently patient's wife noted he was in distress and he complained of chest heaviness and discomfort he had CT PE performed in the ED which was negative for pulmonary embolism, I reviewed the scan independently.  He has what appears to be a bronchitic changes however no severe centrilobular paraseptal or panlobular emphysema, there is no PE and there is no consolidated infiltrate.  Patient is admitted for COPD exacerbation and is being treated for that.  PCCM consultation for further evaluation management.   PAST MEDICAL HISTORY   Past Medical History:  Diagnosis Date   Abnormal EKG    Allergy    Seasonal   Anxiety    Chicken pox    COPD (chronic obstructive pulmonary disease) (HCC)    COVID-19    04/20/21   Depression    GERD (gastroesophageal reflux disease)    Hyperlipidemia    Hypertension    Urine incontinence      SURGICAL HISTORY   Past Surgical History:  Procedure Laterality Date   CHOLECYSTECTOMY  2000   LEFT HEART CATH AND CORONARY ANGIOGRAPHY N/A 01/31/2017   Procedure: LEFT HEART CATH AND CORONARY ANGIOGRAPHY;  Surgeon: Darrell Denyse LABOR, MD;  Location: [facility] INVASIVE CV LAB;  Service: Cardiovascular;  Laterality: N/A;     FAMILY HISTORY   Family History  Problem  Relation Age of Onset   Alcohol abuse Father    Diabetes Father    Cancer Father        ?stage IV thyroid  with met   Cancer Maternal Grandfather        Colon Cancer     SOCIAL HISTORY   Social History[1]   MEDICATIONS    Home Medication:    Current Medication: Current Medications[2]    ALLERGIES   Patient has no known allergies.     REVIEW OF SYSTEMS    Review of Systems:  Gen:  Denies  fever, sweats, chills weigh loss  HEENT: Denies blurred vision, double vision, ear pain, eye pain, hearing loss, nose bleeds, sore throat Cardiac:  No dizziness, chest pain or heaviness, chest tightness,edema Resp:   reports dyspnea chronically  Gi: Denies swallowing difficulty, stomach pain, nausea or vomiting, diarrhea, constipation, bowel incontinence Gu:  Denies bladder incontinence, burning urine Ext:   Denies Joint pain, stiffness or swelling Skin: Denies  skin rash, easy bruising or bleeding or hives Endoc:  Denies polyuria, polydipsia , polyphagia or weight change Psych:   Denies depression, insomnia or hallucinations   Other:  All other systems negative   VS: BP 129/82   Pulse 91   Temp 99.3 F (37.4 C)   Resp (!) 21   Ht 5' 9 (1.753 m)   Wt 72.1 kg   SpO2 92%   BMI  23.47 kg/m      PHYSICAL EXAM    GENERAL:NAD, no fevers, chills, no weakness no fatigue HEAD: Normocephalic, atraumatic.  EYES: Pupils equal, round, reactive to light. Extraocular muscles intact. No scleral icterus.  MOUTH: Moist mucosal membrane. Dentition intact. No abscess noted.  EAR, NOSE, THROAT: Clear without exudates. No external lesions.  NECK: Supple. No thyromegaly. No nodules. No JVD.  PULMONARY: decreased breath sounds with mild rhonchi worse at bases bilaterally.  CARDIOVASCULAR: S1 and S2. Regular rate and rhythm. No murmurs, rubs, or gallops. No edema. Pedal pulses 2+ bilaterally.  GASTROINTESTINAL: Soft, nontender, nondistended. No masses. Positive bowel sounds. No  hepatosplenomegaly.  MUSCULOSKELETAL: No swelling, clubbing, or edema. Range of motion full in all extremities.  NEUROLOGIC: Cranial nerves II through XII are intact. No gross focal neurological deficits. Sensation intact. Reflexes intact.  SKIN: No ulceration, lesions, rashes, or cyanosis. Skin warm and dry. Turgor intact.  PSYCHIATRIC: Mood, affect within normal limits. The patient is awake, alert and oriented x 3. Insight, judgment intact.       IMAGING    CT ANGIOGRAPHY CHEST WITH CONTRAST   TECHNIQUE: Multidetector CT imaging of the chest was performed using the standard protocol during bolus administration of intravenous contrast. Multiplanar CT image reconstructions and MIPs were obtained to evaluate the vascular anatomy.   RADIATION DOSE REDUCTION: This exam was performed according to the departmental dose-optimization program which includes automated exposure control, adjustment of the mA and/or kV according to patient size and/or use of iterative reconstruction technique.   CONTRAST:  OMNIPAQUE  IOHEXOL  350 MG/ML SOLN   COMPARISON:  Chest x-ray 05/02/2024, chest CT 12/17/2023   FINDINGS: Cardiovascular: Satisfactory opacification of the pulmonary arteries to the segmental level. No evidence of pulmonary embolism. Normal heart size. No pericardial effusion. Mild atherosclerosis. No aneurysm. Mild coronary vascular calcification. Normal cardiac size. No pericardial effusion   Mediastinum/Nodes: Patent trachea. No thyroid  mass. No suspicious lymph nodes. Esophagus within normal limits.   Lungs/Pleura: No acute airspace disease, pleural effusion or pneumothorax.   Upper Abdomen: No acute finding. Hepatic steatosis. Calcifications incompletely visualized at the pancreatic head suggesting chronic pancreatitis.   Musculoskeletal: No acute or suspicious osseous abnormality.   Review of the MIP images confirms the above findings.   IMPRESSION: 1. Negative for  acute pulmonary embolus or aortic dissection. Clear lung fields. 2. Hepatic steatosis. 3. Aortic atherosclerosis.   Aortic Atherosclerosis (ICD10-I70.0).     Electronically Signed   By: Darrell Santiago M.D.   On: 05/02/2024 19:57  ASSESSMENT/PLAN   Acute exacerbation of COPD  - moderate severity   - agree with current COPD care path - steroids/dunebs  - have ordered CRP  - Resp viral panel  - IS and flutter   - PT/OT   -will set up on outpatient for treatment with ensifentrine  and possibly Nucala if he qualifies for eosinophilic phenotype.  Otherwise can consider roflumilast since he has bronchitic subtype  Bibasilar atelectasis   - chest PT   - incentive spirometry   Tobacco dependence    - smoking cessation counseling  - transdermal nicotine    - consider outpatient chantix  - consider [facility] smoking cessation tool             Thank you for allowing me to participate in the care of this patient.   Patient/Family are satisfied with care plan and all questions have been answered.    Provider disclosure: Patient with at least one acute or chronic illness or injury that  poses a threat to life or bodily function and is being managed actively during this encounter.  All of the below services have been performed independently by signing provider:  review of prior documentation from internal and or external health records.  Review of previous and current lab results.  Interview and comprehensive assessment during patient visit today. Review of current and previous chest radiographs/CT scans. Discussion of management and test interpretation with health care team and patient/family.   This document was prepared using Dragon voice recognition software and may include unintentional dictation errors.     Lot Medford, M.D.  Division of Pulmonary & Critical Care Medicine             [1]  Social History Tobacco Use   Smoking status: Every Day    Current  packs/day: 1.00    Average packs/day: 1 pack/day for 26.0 years (26.0 ttl pk-yrs)    Types: Cigarettes   Smokeless tobacco: Never   Tobacco comments:    little less than one ppd  Vaping Use   Vaping status: Never Used  Substance Use Topics   Alcohol use: Yes    Alcohol/week: 21.0 standard drinks of alcohol    Types: 21 Shots of liquor per week   Drug use: No  [2]  Current Facility-Administered Medications:    0.9 %  sodium chloride  infusion, , Intravenous, Continuous, Cleatus Delayne GAILS, MD, Last Rate: 40 mL/hr at 05/03/24 0600, Infusion Verify at 05/03/24 0600   acetaminophen  (TYLENOL ) tablet 650 mg, 650 mg, Oral, Q4H PRN **OR** acetaminophen  (TYLENOL ) 160 MG/5ML solution 650 mg, 650 mg, Per Tube, Q4H PRN **OR** acetaminophen  (TYLENOL ) suppository 650 mg, 650 mg, Rectal, Q4H PRN, Cleatus Delayne GAILS, MD   [DISCONTINUED] aspirin  suppository 300 mg, 300 mg, Rectal, Daily **OR** aspirin  EC tablet 81 mg, 81 mg, Oral, Daily, Cleatus Delayne V, MD, 81 mg at 05/03/24 9180   clopidogrel  (PLAVIX ) tablet 75 mg, 75 mg, Oral, Daily, Cleatus Delayne V, MD, 75 mg at 05/03/24 0820   enoxaparin  (LOVENOX ) injection 40 mg, 40 mg, Subcutaneous, Q24H, Cleatus Delayne GAILS, MD, 40 mg at 05/03/24 9178   fluticasone  furoate-vilanterol (BREO ELLIPTA ) 100-25 MCG/ACT 1 puff, 1 puff, Inhalation, Daily, Cleatus Delayne GAILS, MD, 1 puff at 05/03/24 9178   folic acid  (FOLVITE ) tablet 1 mg, 1 mg, Oral, Daily, Quale, Mark, MD, 1 mg at 05/03/24 9180   ipratropium-albuterol  (DUONEB) 0.5-2.5 (3) MG/3ML nebulizer solution 3 mL, 3 mL, Nebulization, Q4H PRN, Cleatus Delayne GAILS, MD   LORazepam  (ATIVAN ) tablet 1-4 mg, 1-4 mg, Oral, Q1H PRN **OR** LORazepam  (ATIVAN ) injection 1-4 mg, 1-4 mg, Intravenous, Q1H PRN, Dicky Anes, MD   nicotine  (NICODERM CQ  - dosed in mg/24 hours) patch 21 mg, 21 mg, Transdermal, Daily, Cleatus Delayne V, MD, 21 mg at 05/03/24 0820   phosphorus (K PHOS  NEUTRAL) tablet 500 mg, 500 mg, Oral, QID, Niels Kayla FALCON, RPH, 500 mg  at 05/03/24 9044   potassium chloride  10 mEq in 100 mL IVPB, 10 mEq, Intravenous, Q1 Hr x 4, Niels Kayla FALCON, [facility], Last Rate: 100 mL/hr at 05/03/24 1320, 10 mEq at 05/03/24 1320   predniSONE  (DELTASONE ) tablet 40 mg, 40 mg, Oral, Q breakfast, Cleatus Delayne V, MD, 40 mg at 05/03/24 0820   sertraline  (ZOLOFT ) tablet 50 mg, 50 mg, Oral, Daily, Cleatus Delayne GAILS, MD, 50 mg at 05/03/24 0820  "
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
## 2024-05-03 NOTE — Plan of Care (Signed)
 Neurology plan of care  59 year old male with history of hypertension, hyperlipidemia, alcohol abuse, smoker, COPD, early dementia, PE in 12/2022, TIA presented to ED for chest pain and shortness of breath and right-sided weakness.  LSW 6am, but exact time onset was not clear. NIHSS = 6 with RLE weakness. CT no acute abnormality.  CTA head and neck no LVO.  Patient not a candidate given no clear time onset and last seen normal was way off the window. Of note, he had a PE in 12/2022, was prescribed Eliquis  however he quit taking old medication 1 months ago.  He drinks alcohol half pint of liquor daily and smoking half pack per day.    EtOH level in ED yesterday 206 315 4 wks ago 388 5 mos ago  Workup this admission:  MRI brain wo contrast 1. No acute intracranial abnormality. 2. Mild age-related cerebral atrophy with chronic small vessel ischemic disease.  CTA H&N 1. Negative CTA of the head and neck. No large vessel occlusion or other emergent finding. No hemodynamically significant or correctable stenosis. 2. Poor dentition with multiple dental caries.  TTE 1. Left ventricular ejection fraction, by estimation, is 60 to 65% . The left ventricle has normal function. The left ventricle has no regional wall motion abnormalities. Left ventricular diastolic parameters are consistent with Grade I diastolic dysfunction ( impaired relaxation) . The average left ventricular global longitudinal strain is 21. 6 % . The global longitudinal strain is normal. 2. Right ventricular systolic function is normal. The right ventricular size is normal. 3. The mitral valve is normal in structure. Trivial mitral valve regurgitation. 4. The aortic valve is normal in structure. Aortic valve regurgitation is not visualized.  All CNS imaging personally reviewed.  Stroke Labs     Component Value Date/Time   CHOL 189 05/03/2024 0655   TRIG 48 05/03/2024 0655   HDL 95 05/03/2024 0655   CHOLHDL 2.0 05/03/2024 0655    VLDL 10 05/03/2024 0655   LDLCALC 84 05/03/2024 0655    Lab Results  Component Value Date/Time   HGBA1C 5.5 05/03/2024 06:55 AM   A/P: D/w Dr. Mosie, patient's deficits are resolved. Suspect R sided weakness 2/2 TIA.  Final recommendations: - Goal normotension, avoid hypotension - Atorvastatin  20mg  daily - Continue eliquis  5mg  bid. From a neuro standpoint no indication for antiplatelets in addition to therapeutic anticoagulation (unless there is another indication to continue both - cards for example) - q4 hr neuro checks - STAT head CT for any change in neuro exam - Tele - PT/OT/SLP - Stroke education - Amb referral to neurology upon discharge   Stroke workup is now completed. Neurology to sign off but please feel free to reach out if any additional neurologic concerns arise.  Elida Ross, MD [facility] [phone]  If 7pm- 7am, please page neurology on call as listed in AMION.

## 2024-05-03 NOTE — TOC Initial Note (Signed)
 Transition of Care [facility]) - Initial/Assessment Note    Patient Details  Name: Darrell Santiago MRN: [id number] Date of Birth: 20-Aug-1965  [facility] CM/SW Contact:    Grayce JAYSON Perfect, RN Phone Number: 05/03/2024, 10:55 AM  Clinical Narrative:                  RN met with patient in his room, wife present.  He is independent in his home.  He drives and denies any issues with picking up his medications nor paying for them.  He is in process of obtaining PCP from [facility] per his wife.  Has cane in home that he does not use.  Admits to some right side weakness.  No insurance listed, spoke with wife and patient later in day,  he should be on her insurance now, just transitioned.  Denies any need to speak with finance at hospital and is going to call her company to inquire about coverage. No further TOC needs at this time.       Patient Goals and CMS Choice            Expected Discharge Plan and Services                                              Prior Living Arrangements/Services                       Activities of Daily Living   ADL Screening (condition at time of admission) Independently performs ADLs?: Yes (appropriate for developmental age) Is the patient deaf or have difficulty hearing?: No Does the patient have difficulty seeing, even when wearing glasses/contacts?: No Does the patient have difficulty concentrating, remembering, or making decisions?: No  Permission Sought/Granted                  Emotional Assessment              Admission diagnosis:  Hypokalemia [E87.6] Alcohol abuse [F10.10] Primary hypertension [I10] Acute left-sided weakness [R53.1] Alcohol dependence with alcohol-induced mood disorder (HCC) [F10.24] Right sided weakness [R53.1] Chest pain, unspecified type [R07.9] Chronic obstructive pulmonary disease, unspecified COPD type (HCC) [J44.9] Patient Active Problem List   Diagnosis Date Noted   Acute  left-sided weakness, possible TIA/CVA 05/02/2024   Dementia associated with alcohol use disorder, with agitation (HCC) 05/02/2024   History of DVT/pulmonary embolus 11/2023 (PE) 05/02/2024   Left leg DVT (HCC) 12/20/2023   Hypophosphatemia 12/19/2023   Acute pulmonary embolism (HCC) 12/19/2023   Sepsis due to undetermined organism (HCC) 12/18/2023   Depression 12/18/2023   Acute pancreatitis 12/17/2023   Episode of recurrent major depressive disorder 11/11/2023   Alcohol use disorder 11/11/2023   Atypical chest pain 06/27/2023   Hyponatremia 10/27/2022   AKI (acute kidney injury) 10/27/2022   Alcohol dependence (HCC) 08/14/2022   History of TIA (transient ischemic attack) 08/14/2022   Colon cancer screening 08/14/2022   Anxiety 08/14/2022   Insomnia 08/14/2022   TIA (transient ischemic attack) 06/30/2022   Thrombocytopenia 06/30/2022   Dyslipidemia 12/30/2021   Mood disorder 12/30/2021   Elevated LFTs 12/30/2021   Adrenal adenoma, left 12/22/2021   Alcoholic hepatitis without ascites (HCC) 12/22/2021   Diverticulosis 12/22/2021   Hypomagnesemia 12/22/2021   Alcohol withdrawal delirium, acute, mixed level of activity (HCC) 11/12/2021   Other chest pain 11/11/2021  Right knee pain 10/26/2021   Lumbar radiculopathy 10/26/2021   Hypokalemia 04/26/2021   Abnormal LFTs 04/26/2021   Shortness of breath 04/26/2021   Neck mass 07/15/2020   Annual physical exam 07/15/2020   Tobacco dependence 07/15/2020   History of suicidal ideation 05/29/2020   Alcohol dependence with alcohol-induced mood disorder (HCC) 05/26/2020   Chronic maxillary sinusitis 05/26/2020   Brain atrophy 05/26/2020   Depression, recurrent 04/29/2020   Overweight (BMI 25.0-29.9) 09/05/2019   Alcohol-induced acute pancreatitis 02/07/2019   Atherosclerosis of native coronary artery of native heart with stable angina pectoris 09/03/2018   Ground glass opacity present on imaging of lung 08/21/2018   Lung nodule  08/21/2018   CAD (coronary artery disease) 08/21/2018   Pleural effusion 08/02/2018   COPD with acute exacerbation (HCC) 08/02/2018   Pancreatic pleural effusion 08/02/2018   Alcohol abuse 07/30/2018   Pancreatic pseudocyst    Pneumonia 07/23/2018   Bilateral impacted cerumen 05/29/2018   Pancreatitis 03/02/2018   Hepatic steatosis 01/19/2018   Stress fracture of left foot 01/19/2018   Elevated liver enzymes 09/19/2017   Hyperlipidemia 09/19/2017   Macrocytosis without anemia 06/05/2017   Anxiety and depression 04/13/2017   HTN (hypertension) 04/13/2017   Abnormal EKG 04/13/2017   Tobacco use disorder 02/08/2017   Chest pain 02/08/2017   Skin mass 08/12/2016   Neuropathy 02/05/2015   Gastroesophageal reflux disease 02/05/2015   Environmental allergies 02/05/2015   PCP:  Pcp, No Pharmacy:   [id number] [place], [place] - [address] AT [facility] [address] [place] [address] Phone: [phone] Fax: [phone]  [id number] [place], [place] - [address] AT [facility] OF [address] [place] [address] Phone: [phone] Fax: [phone]  [facility] - [facility] [address] [place] [address] Phone: [phone] Fax: [phone]     Social Drivers of Health (SDOH) Social History: SDOH Screenings   Food Insecurity: No Food Insecurity (05/03/2024)  Housing: Low Risk (05/03/2024)  Transportation Needs: No Transportation Needs (05/03/2024)  Utilities: Not At Risk (05/03/2024)  Depression (PHQ2-9): Low Risk (08/09/2022)  Financial Resource Strain: Low Risk  (05/02/2023)   Received from [facility]  Tobacco Use: High Risk (05/02/2024)   SDOH Interventions:     Readmission Risk Interventions     No data to display

## 2024-05-03 NOTE — Evaluation (Signed)
 Physical Therapy Evaluation Patient Details Name: Darrell Santiago MRN: [id number] DOB: Dec 18, 1965 Today's Date: 05/03/2024  History of Present Illness  59 y/o male presented to ED on 05/02/24 for SOB and L sided chest pain along with R arm numbness. Admitted for stroke workup and COPD exacerbation. MRI negative. PMH: COPD, CAD, HTN, TIA, anxiety/depression, alcohol use disorder with alcohol induced pancreatitis and alcohol related dementia, hx of DVT/subsegmental PE 11/2023.  Clinical Impression  Patient admitted with the above. PTA, patient lives with wife and was independent with no AD and driving. Patient presents with weakness, impaired balance, and decreased activity tolerance. Completed bed mobility and sit to stand with CGA. Ambulated 25' with RW and CGA with cues for close RW proximity. Increased tremors compared to baseline during eval. Patient with SOB and dizziness with mobility, BP stable. Patient will benefit from skilled PT services during acute stay to address listed deficits. Patient will benefit from ongoing therapy at discharge to maximize functional independence and safety.       If plan is discharge home, recommend the following: A little help with walking and/or transfers;A little help with bathing/dressing/bathroom;Assistance with cooking/housework;Assist for transportation;Help with stairs or ramp for entrance   Can travel by private vehicle        Equipment Recommendations Rolling Shavonta Gossen (2 wheels)  Recommendations for Other Services       Functional Status Assessment Patient has had a recent decline in their functional status and demonstrates the ability to make significant improvements in function in a reasonable and predictable amount of time.     Precautions / Restrictions Precautions Precautions: Fall Recall of Precautions/Restrictions: Intact Restrictions Weight Bearing Restrictions Per Provider Order: No      Mobility  Bed Mobility Overal bed mobility:  Modified Independent                  Transfers Overall transfer level: Needs assistance Equipment used: Rolling Huda Petrey (2 wheels) Transfers: Sit to/from Stand Sit to Stand: Contact guard assist                Ambulation/Gait Ambulation/Gait assistance: Contact guard assist Gait Distance (Feet): 25 Feet Assistive device: Rolling Carlen Fils (2 wheels) Gait Pattern/deviations: Step-through pattern, Decreased stride length Gait velocity: decreased     General Gait Details: CGA for safety. Cues for close proximity to [employer] Mobility     Tilt Bed    Modified [name] (Stroke Patients Only)       Balance Overall balance assessment: Needs assistance Sitting-balance support: Bilateral upper extremity supported Sitting balance-Leahy Scale: Fair     Standing balance support: Bilateral upper extremity supported, Reliant on assistive device for balance Standing balance-Leahy Scale: Poor                               Pertinent Vitals/Pain Pain Assessment Pain Assessment: Faces Faces Pain Scale: Hurts little more Pain Location: R shoulder Pain Descriptors / Indicators: Discomfort Pain Intervention(s): Limited activity within patient's tolerance, Monitored during session, Repositioned    Home Living Family/patient expects to be discharged to:: Private residence Living Arrangements: Spouse/significant other Available Help at Discharge: Family;Available 24 hours/day Type of Home: House Home Access: Level entry       Home Layout: One level Home Equipment: Cane - single point      Prior Function Prior Level of Function : Independent/Modified Independent;Driving  Extremity/Trunk Assessment   Upper Extremity Assessment Upper Extremity Assessment: Defer to OT evaluation    Lower Extremity Assessment Lower Extremity Assessment: Generalized weakness       Communication    Communication Communication: No apparent difficulties    Cognition Arousal: Alert Behavior During Therapy: WFL for tasks assessed/performed   PT - Cognitive impairments: No apparent impairments                         Following commands: Intact       Cueing       General Comments      Exercises     Assessment/Plan    PT Assessment Patient needs continued PT services  PT Problem List Decreased strength;Decreased activity tolerance;Decreased balance;Decreased mobility;Decreased knowledge of use of DME;Decreased safety awareness;Cardiopulmonary status limiting activity       PT Treatment Interventions Gait training;DME instruction;Functional mobility training;Therapeutic activities;Balance training;Therapeutic exercise;Neuromuscular re-education;Patient/family education    PT Goals (Current goals can be found in the Care Plan section)  Acute Rehab PT Goals Patient Stated Goal: to go home PT Goal Formulation: With patient Time For Goal Achievement: 05/17/24 Potential to Achieve Goals: Good    Frequency Min 2X/week     Co-evaluation               AM-PAC PT 6 Clicks Mobility  Outcome Measure Help needed turning from your back to your side while in a flat bed without using bedrails?: A Little Help needed moving from lying on your back to sitting on the side of a flat bed without using bedrails?: A Little Help needed moving to and from a bed to a chair (including a wheelchair)?: A Little Help needed standing up from a chair using your arms (e.g., wheelchair or bedside chair)?: A Little Help needed to walk in hospital room?: A Little Help needed climbing 3-5 steps with a railing? : A Lot 6 Click Score: 17    End of Session   Activity Tolerance: Patient tolerated treatment well Patient left: in bed;with call bell/phone within reach;with bed alarm set;with family/visitor present Nurse Communication: Mobility status PT Visit Diagnosis: Unsteadiness on  feet (R26.81);Muscle weakness (generalized) (M62.81);Difficulty in walking, not elsewhere classified (R26.2)    Time: 9059-9042 PT Time Calculation (min) (ACUTE ONLY): 17 min   Charges:   PT Evaluation $PT Eval Moderate Complexity: 1 Mod   PT General Charges $$ ACUTE PT VISIT: 1 Visit         Maryanne Finder, PT, DPT Physical Therapist - [facility]  [facility]  Irbin Fines A Keshon Markovitz 05/03/2024, 12:50 PM

## 2024-05-04 DIAGNOSIS — F419 Anxiety disorder, unspecified: Secondary | ICD-10-CM

## 2024-05-04 DIAGNOSIS — F32A Depression, unspecified: Secondary | ICD-10-CM

## 2024-05-04 DIAGNOSIS — I10 Essential (primary) hypertension: Secondary | ICD-10-CM

## 2024-05-04 LAB — RESPIRATORY PANEL BY PCR

## 2024-05-04 LAB — BASIC METABOLIC PANEL WITH GFR
Anion gap: 10 (ref 5–15)
Anion gap: 11 (ref 5–15)
BUN: 11 mg/dL (ref 6–20)
BUN: 10 mg/dL (ref 6–20)
CO2: 26 mmol/L (ref 22–32)
CO2: 26 mmol/L (ref 22–32)
Calcium: 8.2 mg/dL — ABNORMAL LOW (ref 8.9–10.3)
Calcium: 8.6 mg/dL — ABNORMAL LOW (ref 8.9–10.3)
Chloride: 103 mmol/L (ref 98–111)
Chloride: 100 mmol/L (ref 98–111)
Creatinine, Ser: 0.59 mg/dL — ABNORMAL LOW (ref 0.61–1.24)
Creatinine, Ser: 0.67 mg/dL (ref 0.61–1.24)
GFR, Estimated: >60 mL/min
GFR, Estimated: >60 mL/min
Glucose, Bld: 100 mg/dL — ABNORMAL HIGH (ref 70–99)
Glucose, Bld: 120 mg/dL — ABNORMAL HIGH (ref 70–99)
Potassium: 2.7 mmol/L — CL (ref 3.5–5.1)
Potassium: 3.4 mmol/L — ABNORMAL LOW (ref 3.5–5.1)
Sodium: 139 mmol/L (ref 135–145)
Sodium: 137 mmol/L (ref 135–145)

## 2024-05-04 LAB — PHOSPHORUS: Phosphorus: 2.7 mg/dL (ref 2.5–4.6)

## 2024-05-04 LAB — MAGNESIUM
Magnesium: 1.6 mg/dL — ABNORMAL LOW (ref 1.7–2.4)
Magnesium: 2.1 mg/dL (ref 1.7–2.4)

## 2024-05-04 MED ORDER — MAGNESIUM SULFATE 4 GM/100ML IV SOLN
4.0000 g | Freq: Once | INTRAVENOUS | Status: AC
  Administered 2024-05-04: 4 g via INTRAVENOUS
  Filled 2024-05-04: qty 100

## 2024-05-04 MED ORDER — POTASSIUM CHLORIDE 10 MEQ/100ML IV SOLN
10.0000 meq | INTRAVENOUS | Status: AC
  Administered 2024-05-04 (×4): 10 meq via INTRAVENOUS
  Filled 2024-05-04 (×2): qty 100

## 2024-05-04 MED ORDER — POTASSIUM CHLORIDE CRYS ER 20 MEQ PO TBCR
40.0000 meq | EXTENDED_RELEASE_TABLET | Freq: Once | ORAL | Status: AC
  Administered 2024-05-04: 40 meq via ORAL
  Filled 2024-05-04: qty 2

## 2024-05-04 MED ORDER — POTASSIUM CHLORIDE CRYS ER 20 MEQ PO TBCR
40.0000 meq | EXTENDED_RELEASE_TABLET | Freq: Two times a day (BID) | ORAL | Status: DC
  Administered 2024-05-04: 40 meq via ORAL
  Filled 2024-05-04: qty 2

## 2024-05-04 NOTE — Progress Notes (Signed)
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 contact night-coverage  05/04/2024, 10:53 AM      "

## 2024-05-04 NOTE — Progress Notes (Signed)
 "    PULMONOLOGY         Date: 05/04/2024,   MRN# [id number] Darrell Santiago [birth year]     AdmissionWeight: 72.1 kg                 CurrentWeight: 72.1 kg  Referring provider: Al-Sultani   CHIEF COMPLAINT:      HISTORY OF PRESENT ILLNESS   This is a pleasant 59 year old with a history of seasonal allergies, COPD, major depressive disorder essential hypertension, urinary incontinence, dyslipidemia, GERD, history of COVID-19 in 2023, anxiety disorder, history of chickenpox, alcohol induced pancreatitis, alcoholic related dementia, history of subsegmental PE in August 2025 with noncompliance medication who came in with chief complaint of respiratory distress wheezing and loud breathing.  Apparently patient's wife noted he was in distress and he complained of chest heaviness and discomfort he had CT PE performed in the ED which was negative for pulmonary embolism, I reviewed the scan independently.  He has what appears to be a bronchitic changes however no severe centrilobular paraseptal or panlobular emphysema, there is no PE and there is no consolidated infiltrate.  Patient is admitted for COPD exacerbation and is being treated for that.  PCCM consultation for further evaluation management.  05/04/24- patient is cleared for dc home with outpatient follow up.  [facility] will continue his COPD care post discharge  PAST MEDICAL HISTORY   Past Medical History:  Diagnosis Date   Abnormal EKG    Allergy    Seasonal   Anxiety    Chicken pox    COPD (chronic obstructive pulmonary disease) (HCC)    COVID-19    04/20/21   Depression    GERD (gastroesophageal reflux disease)    Hyperlipidemia    Hypertension    Urine incontinence      SURGICAL HISTORY   Past Surgical History:  Procedure Laterality Date   CHOLECYSTECTOMY  2000   LEFT HEART CATH AND CORONARY ANGIOGRAPHY N/A 01/31/2017   Procedure: LEFT HEART CATH AND CORONARY ANGIOGRAPHY;  Surgeon: Fernand Denyse LABOR, MD;   Location: [facility] INVASIVE CV LAB;  Service: Cardiovascular;  Laterality: N/A;     FAMILY HISTORY   Family History  Problem Relation Age of Onset   Alcohol abuse Father    Diabetes Father    Cancer Father        ?stage IV thyroid  with met   Cancer Maternal Grandfather        Colon Cancer     SOCIAL HISTORY   Social History[1]   MEDICATIONS    Home Medication:    Current Medication: Current Medications[2]    ALLERGIES   Patient has no known allergies.     REVIEW OF SYSTEMS    Review of Systems:  Gen:  Denies  fever, sweats, chills weigh loss  HEENT: Denies blurred vision, double vision, ear pain, eye pain, hearing loss, nose bleeds, sore throat Cardiac:  No dizziness, chest pain or heaviness, chest tightness,edema Resp:   reports dyspnea chronically  Gi: Denies swallowing difficulty, stomach pain, nausea or vomiting, diarrhea, constipation, bowel incontinence Gu:  Denies bladder incontinence, burning urine Ext:   Denies Joint pain, stiffness or swelling Skin: Denies  skin rash, easy bruising or bleeding or hives Endoc:  Denies polyuria, polydipsia , polyphagia or weight change Psych:   Denies depression, insomnia or hallucinations   Other:  All other systems negative   VS: BP 107/64 (BP Location: Left Arm)   Pulse 67   Temp 98.6 F (37  C) (Oral)   Resp 16   Ht 5' 9 (1.753 m)   Wt 72.1 kg   SpO2 93%   BMI 23.47 kg/m      PHYSICAL EXAM    GENERAL:NAD, no fevers, chills, no weakness no fatigue HEAD: Normocephalic, atraumatic.  EYES: Pupils equal, round, reactive to light. Extraocular muscles intact. No scleral icterus.  MOUTH: Moist mucosal membrane. Dentition intact. No abscess noted.  EAR, NOSE, THROAT: Clear without exudates. No external lesions.  NECK: Supple. No thyromegaly. No nodules. No JVD.  PULMONARY: decreased breath sounds with mild rhonchi worse at bases bilaterally.  CARDIOVASCULAR: S1 and S2. Regular rate and rhythm. No  murmurs, rubs, or gallops. No edema. Pedal pulses 2+ bilaterally.  GASTROINTESTINAL: Soft, nontender, nondistended. No masses. Positive bowel sounds. No hepatosplenomegaly.  MUSCULOSKELETAL: No swelling, clubbing, or edema. Range of motion full in all extremities.  NEUROLOGIC: Cranial nerves II through XII are intact. No gross focal neurological deficits. Sensation intact. Reflexes intact.  SKIN: No ulceration, lesions, rashes, or cyanosis. Skin warm and dry. Turgor intact.  PSYCHIATRIC: Mood, affect within normal limits. The patient is awake, alert and oriented x 3. Insight, judgment intact.       IMAGING    CT ANGIOGRAPHY CHEST WITH CONTRAST   TECHNIQUE: Multidetector CT imaging of the chest was performed using the standard protocol during bolus administration of intravenous contrast. Multiplanar CT image reconstructions and MIPs were obtained to evaluate the vascular anatomy.   RADIATION DOSE REDUCTION: This exam was performed according to the departmental dose-optimization program which includes automated exposure control, adjustment of the mA and/or kV according to patient size and/or use of iterative reconstruction technique.   CONTRAST:  OMNIPAQUE  IOHEXOL  350 MG/ML SOLN   COMPARISON:  Chest x-ray 05/02/2024, chest CT 12/17/2023   FINDINGS: Cardiovascular: Satisfactory opacification of the pulmonary arteries to the segmental level. No evidence of pulmonary embolism. Normal heart size. No pericardial effusion. Mild atherosclerosis. No aneurysm. Mild coronary vascular calcification. Normal cardiac size. No pericardial effusion   Mediastinum/Nodes: Patent trachea. No thyroid  mass. No suspicious lymph nodes. Esophagus within normal limits.   Lungs/Pleura: No acute airspace disease, pleural effusion or pneumothorax.   Upper Abdomen: No acute finding. Hepatic steatosis. Calcifications incompletely visualized at the pancreatic head suggesting chronic pancreatitis.    Musculoskeletal: No acute or suspicious osseous abnormality.   Review of the MIP images confirms the above findings.   IMPRESSION: 1. Negative for acute pulmonary embolus or aortic dissection. Clear lung fields. 2. Hepatic steatosis. 3. Aortic atherosclerosis.   Aortic Atherosclerosis (ICD10-I70.0).     Electronically Signed   By: Luke Bun M.D.   On: 05/02/2024 19:57  ASSESSMENT/PLAN   Acute exacerbation of COPD  - moderate severity   - agree with current COPD care path - steroids/dunebs  - have ordered CRP  - Resp viral panel  - IS and flutter   - PT/OT   -will set up on outpatient for treatment with ensifentrine  and possibly Nucala if he qualifies for eosinophilic phenotype.  Otherwise can consider roflumilast since he has bronchitic subtype  Bibasilar atelectasis   - chest PT   - incentive spirometry   Tobacco dependence    - smoking cessation counseling  - transdermal nicotine    - consider outpatient chantix  - consider [facility] smoking cessation tool             Thank you for allowing me to participate in the care of this patient.   Patient/Family are satisfied with  care plan and all questions have been answered.    Provider disclosure: Patient with at least one acute or chronic illness or injury that poses a threat to life or bodily function and is being managed actively during this encounter.  All of the below services have been performed independently by signing provider:  review of prior documentation from internal and or external health records.  Review of previous and current lab results.  Interview and comprehensive assessment during patient visit today. Review of current and previous chest radiographs/CT scans. Discussion of management and test interpretation with health care team and patient/family.   This document was prepared using Dragon voice recognition software and may include unintentional dictation errors.     Sheehan Stacey, M.D.   Division of Pulmonary & Critical Care Medicine              [1]  Social History Tobacco Use   Smoking status: Every Day    Current packs/day: 1.00    Average packs/day: 1 pack/day for 26.0 years (26.0 ttl pk-yrs)    Types: Cigarettes   Smokeless tobacco: Never   Tobacco comments:    little less than one ppd  Vaping Use   Vaping status: Never Used  Substance Use Topics   Alcohol use: Yes    Alcohol/week: 21.0 standard drinks of alcohol    Types: 21 Shots of liquor per week   Drug use: No  [2]  Current Facility-Administered Medications:    acetaminophen  (TYLENOL ) tablet 650 mg, 650 mg, Oral, Q4H PRN **OR** acetaminophen  (TYLENOL ) 160 MG/5ML solution 650 mg, 650 mg, Per Tube, Q4H PRN **OR** acetaminophen  (TYLENOL ) suppository 650 mg, 650 mg, Rectal, Q4H PRN, Cleatus Delayne GAILS, MD   [DISCONTINUED] aspirin  suppository 300 mg, 300 mg, Rectal, Daily **OR** aspirin  EC tablet 81 mg, 81 mg, Oral, Daily, Cleatus Delayne GAILS, MD, 81 mg at 05/03/24 9180   clopidogrel  (PLAVIX ) tablet 75 mg, 75 mg, Oral, Daily, Cleatus Delayne GAILS, MD, 75 mg at 05/03/24 0820   enoxaparin  (LOVENOX ) injection 40 mg, 40 mg, Subcutaneous, Q24H, Cleatus Delayne GAILS, MD, 40 mg at 05/03/24 9178   fluticasone  furoate-vilanterol (BREO ELLIPTA ) 100-25 MCG/ACT 1 puff, 1 puff, Inhalation, Daily, Cleatus Delayne GAILS, MD, 1 puff at 05/03/24 9178   folic acid  (FOLVITE ) tablet 1 mg, 1 mg, Oral, Daily, Quale, Mark, MD, 1 mg at 05/03/24 9180   ipratropium-albuterol  (DUONEB) 0.5-2.5 (3) MG/3ML nebulizer solution 3 mL, 3 mL, Nebulization, Q4H PRN, Cleatus Delayne GAILS, MD   LORazepam  (ATIVAN ) tablet 1-4 mg, 1-4 mg, Oral, Q1H PRN **OR** LORazepam  (ATIVAN ) injection 1-4 mg, 1-4 mg, Intravenous, Q1H PRN, Dicky Anes, MD   magnesium  sulfate IVPB 4 g 100 mL, 4 g, Intravenous, Once, Patel, Kishan S, RPH   nicotine  (NICODERM CQ  - dosed in mg/24 hours) patch 21 mg, 21 mg, Transdermal, Daily, Cleatus Delayne V, MD, 21 mg at 05/03/24 0820   potassium chloride   10 mEq in 100 mL IVPB, 10 mEq, Intravenous, Q1 Hr x 4, Patel, Kishan S, RPH   potassium chloride  SA (KLOR-CON  M) CR tablet 40 mEq, 40 mEq, Oral, BID, Tobie Kishan S, RPH   predniSONE  (DELTASONE ) tablet 40 mg, 40 mg, Oral, Q breakfast, Cleatus Delayne V, MD, 40 mg at 05/03/24 0820   sertraline  (ZOLOFT ) tablet 50 mg, 50 mg, Oral, Daily, Cleatus Delayne GAILS, MD, 50 mg at 05/03/24 0820  "

## 2024-05-04 NOTE — Consult Note (Signed)
 PHARMACY CONSULT NOTE - ELECTROLYTES  Pharmacy Consult for Electrolyte Monitoring and Replacement   Recent Labs: Height: 5' 9 (175.3 cm) Weight: 72.1 kg (158 lb 15.2 oz) IBW/kg (Calculated) : 70.7 Estimated Creatinine Clearance: 103.1 mL/min (A) (by C-G formula based on SCr of 0.59 mg/dL (L)). Potassium (mmol/L)  Date Value  05/04/2024 2.7 (LL)   Magnesium  (mg/dL)  Date Value  98/82/7973 1.6 (L)   Calcium  (mg/dL)  Date Value  98/82/7973 8.2 (L)   Albumin (g/dL)  Date Value  98/84/7973 3.7   Phosphorus (mg/dL)  Date Value  98/82/7973 2.7   Sodium (mmol/L)  Date Value  05/04/2024 139    Assessment  Darrell Santiago is a 59 y.o. male presenting with acute left sided weakness concerning for possible TIA/CVA. PMH significant for TIA, . Pharmacy has been consulted to monitor and replace electrolytes.  Diet: heart healthy MIVF: none Pertinent medications: n/a  Goal of Therapy: Electrolytes WNL  Plan:  Mg 4 g IV x 1  KCL 10 mEq x 4 + KCL 40 mEq x 2 F/u with AM labs.   Thank you for allowing pharmacy to be a part of this patient's care.  Cathaleen GORMAN Blanch, PharmD Clinical Pharmacist 05/04/2024 7:49 AM

## 2024-05-04 NOTE — Progress Notes (Signed)
 Physical Therapy Treatment Patient Details Name: Darrell Santiago MRN: [id number] DOB: July 09, 1965 Today's Date: 05/04/2024   History of Present Illness 59 y/o male presented to ED on 05/02/24 for SOB and L sided chest pain along with R arm numbness. Admitted for stroke workup and COPD exacerbation. MRI negative. PMH: COPD, CAD, HTN, TIA, anxiety/depression, alcohol use disorder with alcohol induced pancreatitis and alcohol related dementia, hx of DVT/subsegmental PE 11/2023.    PT Comments  Patient seen for PT session focused on functional mobility. Patient required CGA/supervision for hallway ambulation distance 180' and used IV pole. Tolerated session well with slight  signs of exertion. Vitals remained stable during activity. Main limiting factors today were fatigue. Interventions aimed at improving activity tolerance. Patient shows great potential to make progress with continued acute level rehab. Patient continues to demonstrate moderate activity restrictions and poor tolerance for progressive mobility. Continued skilled PT recommended to progress toward functional goals and support discharge readiness. Pt making good progress toward goals, will continue to follow POC. Discharge recommendation remains appropriate      If plan is discharge home, recommend the following: A little help with walking and/or transfers;A little help with bathing/dressing/bathroom;Assistance with cooking/housework;Assist for transportation;Help with stairs or ramp for entrance   Can travel by private vehicle        Equipment Recommendations  Rolling walker (2 wheels)    Recommendations for Other Services       Precautions / Restrictions Precautions Precautions: Fall Recall of Precautions/Restrictions: Intact Restrictions Weight Bearing Restrictions Per Provider Order: No     Mobility  Bed Mobility Overal bed mobility: Needs Assistance Bed Mobility: Supine to Sit, Sit to Supine     Supine to sit: Modified  independent (Device/Increase time) Sit to supine: Modified independent (Device/Increase time)        Transfers Overall transfer level: Needs assistance Equipment used: Rolling walker (2 wheels) Transfers: Sit to/from Stand Sit to Stand: Min assist                Ambulation/Gait Ambulation/Gait assistance: Contact guard assist Gait Distance (Feet): 180 Feet Assistive device: IV Pole, None Gait Pattern/deviations: Step-through pattern, Decreased stride length       General Gait Details: CGA for safety. mild unsteadiness when instructed to walk independently   Stairs             Wheelchair Mobility     Tilt Bed    Modified [name] (Stroke Patients Only)       Balance Overall balance assessment: Needs assistance Sitting-balance support: Bilateral upper extremity supported Sitting balance-Leahy Scale: Fair     Standing balance support: Bilateral upper extremity supported, Reliant on assistive device for balance Standing balance-Leahy Scale: Poor                              Communication Communication Communication: No apparent difficulties  Cognition Arousal: Alert Behavior During Therapy: WFL for tasks assessed/performed   PT - Cognitive impairments: No apparent impairments                         Following commands: Intact      Cueing    Exercises      General Comments        Pertinent Vitals/Pain Pain Assessment Pain Assessment: Faces Faces Pain Scale: Hurts a little bit Pain Location: R shoulder Pain Descriptors / Indicators: Discomfort Pain Intervention(s): Monitored during session  Home Living                          Prior Function            PT Goals (current goals can now be found in the care plan section) Acute Rehab PT Goals Patient Stated Goal: to go home PT Goal Formulation: With patient Time For Goal Achievement: 05/17/24 Potential to Achieve Goals: Good Progress towards PT  goals: Progressing toward goals    Frequency    Min 2X/week      PT Plan      Co-evaluation              AM-PAC PT 6 Clicks Mobility   Outcome Measure  Help needed turning from your back to your side while in a flat bed without using bedrails?: A Little Help needed moving from lying on your back to sitting on the side of a flat bed without using bedrails?: A Little Help needed moving to and from a bed to a chair (including a wheelchair)?: A Little Help needed standing up from a chair using your arms (e.g., wheelchair or bedside chair)?: A Little Help needed to walk in hospital room?: A Little Help needed climbing 3-5 steps with a railing? : A Lot 6 Click Score: 17    End of Session   Activity Tolerance: Patient tolerated treatment well Patient left: in bed;with call bell/phone within reach;with bed alarm set;with family/visitor present Nurse Communication: Mobility status PT Visit Diagnosis: Unsteadiness on feet (R26.81);Muscle weakness (generalized) (M62.81);Difficulty in walking, not elsewhere classified (R26.2)     Time: 8888-8879 PT Time Calculation (min) (ACUTE ONLY): 9 min  Charges:    $Therapeutic Activity: 8-22 mins PT General Charges $$ ACUTE PT VISIT: 1 Visit                     Sherlean Lesches DPT, PT     Sherlean A Rosalynd Mcwright 05/04/2024, 11:26 AM

## 2024-05-04 NOTE — Progress Notes (Signed)
 The beneficiary has a mobility limitation that significantly impairs his/her ability to participate in one or more mobility-related activities of daily living (MRADL) in the home. The patient is able to safely use the walker. The functional mobility deficit can be sufficiently resolved by use of walker.

## 2024-05-04 NOTE — Plan of Care (Signed)
 MRI c spine pending. Will see patient in f/u tmrw when results are available.  Elida Ross, MD [facility] [phone]  If 7pm- 7am, please page neurology on call as listed in AMION.

## 2024-05-04 NOTE — TOC Progression Note (Addendum)
 Transition of Care [facility]) - Progression Note    Patient Details  Name: Darrell Santiago MRN: [id number] Date of Birth: Aug 11, 1965  [facility] CM/SW Contact  Arelys Glassco L Tyquavious [place], [place] Phone Number: 05/04/2024, 5:36 PM  Clinical Narrative:      CSW met with patient. Spouse is at bedside. CSW reviewed recommendations for home health and DME. Patient declined Home Health. He advised that he will be staying with his son and daughter in law and they will take care of him. Patient agreeable to DME recommendation.   Spouse advised that patient had a life changing event and she is able to add him to her insurance plan. She won't know until Monday but she was advised that she would be able to add him to her plan.  Patient agreeable to [employer] filling DME order. DME ordered through [facility].   Substance abuse resources added to the AVS. TOC does not provide education/counseling. PCP resources also added to the AVS.                      Expected Discharge Plan and Services                                               Social Drivers of Health (SDOH) Interventions SDOH Screenings   Food Insecurity: No Food Insecurity (05/03/2024)  Housing: Low Risk (05/03/2024)  Transportation Needs: No Transportation Needs (05/03/2024)  Utilities: Not At Risk (05/03/2024)  Depression (PHQ2-9): Low Risk (08/09/2022)  Financial Resource Strain: Low Risk  (05/02/2023)   Received from [facility]  Tobacco Use: High Risk (05/02/2024)    Readmission Risk Interventions     No data to display

## 2024-05-04 NOTE — Plan of Care (Signed)

## 2024-05-04 NOTE — Discharge Instructions (Signed)

## 2024-05-05 ENCOUNTER — Other Ambulatory Visit: Payer: Self-pay

## 2024-05-05 LAB — PHOSPHORUS: Phosphorus: 1.9 mg/dL — ABNORMAL LOW (ref 2.5–4.6)

## 2024-05-05 LAB — RENAL FUNCTION PANEL
Albumin: 3.7 g/dL (ref 3.5–5.0)
Anion gap: 9 (ref 5–15)
BUN: 9 mg/dL (ref 6–20)
CO2: 27 mmol/L (ref 22–32)
Calcium: 9 mg/dL (ref 8.9–10.3)
Chloride: 104 mmol/L (ref 98–111)
Creatinine, Ser: 0.62 mg/dL (ref 0.61–1.24)
GFR, Estimated: >60 mL/min
Glucose, Bld: 96 mg/dL (ref 70–99)
Phosphorus: 2.1 mg/dL — ABNORMAL LOW (ref 2.5–4.6)
Potassium: 3.5 mmol/L (ref 3.5–5.1)
Sodium: 140 mmol/L (ref 135–145)

## 2024-05-05 LAB — MAGNESIUM: Magnesium: 1.9 mg/dL (ref 1.7–2.4)

## 2024-05-05 LAB — POTASSIUM: Potassium: 2.9 mmol/L — ABNORMAL LOW (ref 3.5–5.1)

## 2024-05-05 MED ORDER — MAGNESIUM SULFATE 2 GM/50ML IV SOLN
2.0000 g | Freq: Once | INTRAVENOUS | Status: AC
  Administered 2024-05-05: 2 g via INTRAVENOUS
  Filled 2024-05-05: qty 50

## 2024-05-05 MED ORDER — FLUTICASONE FUROATE-VILANTEROL 100-25 MCG/ACT IN AEPB: 1.0000 | INHALATION_SPRAY | Freq: Every day | RESPIRATORY_TRACT | Status: AC

## 2024-05-05 MED ORDER — PREDNISONE 20 MG PO TABS
40.0000 mg | ORAL_TABLET | Freq: Every day | ORAL | 0 refills | Status: AC
  Filled 2024-05-05: qty 4, 2d supply, fill #0

## 2024-05-05 MED ORDER — FOLIC ACID 1 MG PO TABS
1.0000 mg | ORAL_TABLET | Freq: Every day | ORAL | 0 refills | Status: AC
  Filled 2024-05-05: qty 30, 30d supply, fill #0

## 2024-05-05 MED ORDER — POTASSIUM CHLORIDE CRYS ER 20 MEQ PO TBCR
40.0000 meq | EXTENDED_RELEASE_TABLET | Freq: Every day | ORAL | 0 refills | Status: AC
  Filled 2024-05-05: qty 5, 5d supply, fill #0
  Filled 2024-05-06: qty 14, 7d supply, fill #0

## 2024-05-05 MED ORDER — POTASSIUM CHLORIDE CRYS ER 20 MEQ PO TBCR
40.0000 meq | EXTENDED_RELEASE_TABLET | Freq: Once | ORAL | Status: AC
  Administered 2024-05-05: 40 meq via ORAL
  Filled 2024-05-05: qty 2

## 2024-05-05 MED ORDER — POTASSIUM & SODIUM PHOSPHATES 280-160-250 MG PO PACK
2.0000 | PACK | Freq: Once | ORAL | Status: AC
  Administered 2024-05-05: 2 via ORAL
  Filled 2024-05-05: qty 2

## 2024-05-05 MED ORDER — POTASSIUM CHLORIDE 10 MEQ/100ML IV SOLN
10.0000 meq | INTRAVENOUS | Status: AC
  Administered 2024-05-05 (×4): 10 meq via INTRAVENOUS
  Filled 2024-05-05 (×2): qty 100

## 2024-05-05 MED ORDER — POTASSIUM PHOSPHATES 15 MMOLE/5ML IV SOLN
30.0000 mmol | Freq: Once | INTRAVENOUS | Status: AC
  Administered 2024-05-05: 30 mmol via INTRAVENOUS
  Filled 2024-05-05: qty 30

## 2024-05-05 MED ORDER — THIAMINE MONONITRATE 100 MG PO TABS
100.0000 mg | ORAL_TABLET | Freq: Every day | ORAL | Status: DC
  Administered 2024-05-05 – 2024-05-06 (×2): 100 mg via ORAL
  Filled 2024-05-05 (×2): qty 1

## 2024-05-05 MED ORDER — VITAMIN B-1 100 MG PO TABS
100.0000 mg | ORAL_TABLET | Freq: Every day | ORAL | 0 refills | Status: AC
  Filled 2024-05-05: qty 14, 14d supply, fill #0

## 2024-05-05 MED ORDER — APIXABAN 5 MG PO TABS: 5.0000 mg | ORAL_TABLET | Freq: Two times a day (BID) | ORAL | Status: DC

## 2024-05-05 MED ORDER — APIXABAN 5 MG PO TABS: 5.0000 mg | ORAL_TABLET | Freq: Two times a day (BID) | ORAL | Status: AC

## 2024-05-05 MED ORDER — APIXABAN 5 MG PO TABS
5.0000 mg | ORAL_TABLET | Freq: Two times a day (BID) | ORAL | Status: DC
  Administered 2024-05-05 – 2024-05-06 (×2): 5 mg via ORAL
  Filled 2024-05-05 (×2): qty 1

## 2024-05-05 NOTE — Consult Note (Signed)
 PHARMACY CONSULT NOTE - ELECTROLYTES  Pharmacy Consult for Electrolyte Monitoring and Replacement   Recent Labs: Height: 5' 9 (175.3 cm) Weight: 72.1 kg (158 lb 15.2 oz) IBW/kg (Calculated) : 70.7 Estimated Creatinine Clearance: 103.1 mL/min (by C-G formula based on SCr of 0.62 mg/dL). Potassium (mmol/L)  Date Value  05/05/2024 3.5   Magnesium  (mg/dL)  Date Value  98/81/7973 1.9   Calcium  (mg/dL)  Date Value  98/81/7973 9.0   Albumin (g/dL)  Date Value  98/81/7973 3.7   Phosphorus (mg/dL)  Date Value  98/81/7973 2.1 (L)   Sodium (mmol/L)  Date Value  05/05/2024 140    Assessment  Darrell Santiago is a 59 y.o. male presenting with acute left sided weakness concerning for possible TIA/CVA. PMH significant for TIA, . Pharmacy has been consulted to monitor and replace electrolytes.  Diet: heart healthy MIVF: none Pertinent medications: n/a  Goal of Therapy: Electrolytes WNL  Plan:  Mg 2 g IV x 1  KCL 40 mEq x 1 by medical team.  Phos Nak 2 packets x 1 by medical team  F/u with AM labs.   Thank you for allowing pharmacy to be a part of this patient's care.  Cathaleen GORMAN Blanch, PharmD Clinical Pharmacist 05/05/2024 7:38 AM

## 2024-05-05 NOTE — Plan of Care (Signed)
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
## 2024-05-05 NOTE — Plan of Care (Signed)
 " Problem: Education: Goal: Knowledge of disease or condition will improve 05/05/2024 1528 by Merilee, Jeptha Hinnenkamp R, LPN Outcome: Progressing 05/05/2024 1248 by Merilee, Loyalty Brashier R, LPN Outcome: Progressing Goal: Knowledge of secondary prevention will improve (MUST DOCUMENT ALL) 05/05/2024 1528 by Merilee, Kampbell Holaway R, LPN Outcome: Progressing 05/05/2024 1248 by Merilee, Latreshia Beauchaine R, LPN Outcome: Progressing Goal: Knowledge of patient specific risk factors will improve (DELETE if not current risk factor) 05/05/2024 1528 by Merilee, Felesha Moncrieffe R, LPN Outcome: Progressing 05/05/2024 1248 by Merilee, Trentyn Boisclair R, LPN Outcome: Progressing   Problem: Ischemic Stroke/TIA Tissue Perfusion: Goal: Complications of ischemic stroke/TIA will be minimized 05/05/2024 1528 by Merilee, Wilhemina Grall R, LPN Outcome: Progressing 05/05/2024 1248 by Merilee, Posey Jasmin R, LPN Outcome: Progressing   Problem: Coping: Goal: Will verbalize positive feelings about self 05/05/2024 1528 by Merilee, Azaela Caracci R, LPN Outcome: Progressing 05/05/2024 1248 by Merilee, Dwayne Bulkley R, LPN Outcome: Progressing Goal: Will identify appropriate support needs 05/05/2024 1528 by Merilee, Janete Quilling R, LPN Outcome: Progressing 05/05/2024 1248 by Merilee, Martez Weiand R, LPN Outcome: Progressing   Problem: Health Behavior/Discharge Planning: Goal: Ability to manage health-related needs will improve 05/05/2024 1528 by Merilee, Inika Bellanger R, LPN Outcome: Progressing 05/05/2024 1248 by Merilee, Kienan Doublin R, LPN Outcome: Progressing Goal: Goals will be collaboratively established with patient/family 05/05/2024 1528 by Merilee, Meliza Kage R, LPN Outcome: Progressing 05/05/2024 1248 by Merilee, Ladina Shutters R, LPN Outcome: Progressing   Problem: Self-Care: Goal: Ability to participate in self-care as condition permits will improve 05/05/2024 1528 by Merilee, Sylvain Hasten R, LPN Outcome: Progressing 05/05/2024 1248 by Merilee, Tyshawn Ciullo R, LPN Outcome: Progressing Goal: Verbalization of feelings and  concerns over difficulty with self-care will improve 05/05/2024 1528 by Merilee, Edwena Mayorga R, LPN Outcome: Progressing 05/05/2024 1248 by Merilee, Taimi Towe R, LPN Outcome: Progressing Goal: Ability to communicate needs accurately will improve 05/05/2024 1528 by Merilee, Kamyia Thomason R, LPN Outcome: Progressing 05/05/2024 1248 by Merilee, Marlaya Turck R, LPN Outcome: Progressing   Problem: Nutrition: Goal: Risk of aspiration will decrease 05/05/2024 1528 by Merilee, Andrell Bergeson R, LPN Outcome: Progressing 05/05/2024 1248 by Merilee, Francisco Ostrovsky R, LPN Outcome: Progressing Goal: Dietary intake will improve 05/05/2024 1528 by Merilee, Evian Salguero R, LPN Outcome: Progressing 05/05/2024 1248 by Merilee, Baleria Wyman R, LPN Outcome: Progressing   Problem: Education: Goal: Knowledge of General Education information will improve Description: Including pain rating scale, medication(s)/side effects and non-pharmacologic comfort measures 05/05/2024 1528 by Merilee, Sabryna Lahm R, LPN Outcome: Progressing 05/05/2024 1248 by Merilee, Fartun Paradiso R, LPN Outcome: Progressing   Problem: Health Behavior/Discharge Planning: Goal: Ability to manage health-related needs will improve 05/05/2024 1528 by Merilee, Jezebel Pollet R, LPN Outcome: Progressing 05/05/2024 1248 by Merilee, Siraj Dermody R, LPN Outcome: Progressing   Problem: Clinical Measurements: Goal: Ability to maintain clinical measurements within normal limits will improve 05/05/2024 1528 by Merilee, Guneet Delpino R, LPN Outcome: Progressing 05/05/2024 1248 by Merilee, Myeisha Kruser R, LPN Outcome: Progressing Goal: Will remain free from infection 05/05/2024 1528 by Merilee, Tracker Mance R, LPN Outcome: Progressing 05/05/2024 1248 by Merilee, Zylie Mumaw R, LPN Outcome: Progressing Goal: Diagnostic test results will improve 05/05/2024 1528 by Merilee, Zaiah Credeur R, LPN Outcome: Progressing 05/05/2024 1248 by Merilee, Vickie Ponds R, LPN Outcome: Progressing Goal: Respiratory complications will improve 05/05/2024 1528 by Merilee,  Mehreen Azizi R, LPN Outcome: Progressing 05/05/2024 1248 by Merilee, Jibril Mcminn R, LPN Outcome: Progressing Goal: Cardiovascular complication will be avoided 05/05/2024 1528 by Merilee Izetta SAUNDERS, LPN Outcome: Progressing 05/05/2024 1248 by Merilee, Jhett Fretwell R, LPN Outcome: Progressing   Problem: Activity: Goal: Risk for activity intolerance will decrease 05/05/2024 1528 by Merilee Izetta SAUNDERS, LPN Outcome: Progressing 05/05/2024 1248  by Merilee, Caige Almeda R, LPN Outcome: Progressing   Problem: Nutrition: Goal: Adequate nutrition will be maintained 05/05/2024 1528 by Merilee, Krystiana Fornes R, LPN Outcome: Progressing 05/05/2024 1248 by Merilee, Horrace Hanak R, LPN Outcome: Progressing   Problem: Coping: Goal: Level of anxiety will decrease 05/05/2024 1528 by Merilee, Lalla Laham R, LPN Outcome: Progressing 05/05/2024 1248 by Merilee, Gailyn Crook R, LPN Outcome: Progressing   Problem: Elimination: Goal: Will not experience complications related to bowel motility 05/05/2024 1528 by Merilee, Gara Kincade R, LPN Outcome: Progressing 05/05/2024 1248 by Merilee, Eudelia Hiltunen R, LPN Outcome: Progressing Goal: Will not experience complications related to urinary retention 05/05/2024 1528 by Merilee, Eternity Dexter R, LPN Outcome: Progressing 05/05/2024 1248 by Merilee, Daivik Overley R, LPN Outcome: Progressing   Problem: Pain Managment: Goal: General experience of comfort will improve and/or be controlled 05/05/2024 1528 by Merilee, Ann Bohne R, LPN Outcome: Progressing 05/05/2024 1248 by Merilee, Ruey Storer R, LPN Outcome: Progressing   Problem: Safety: Goal: Ability to remain free from injury will improve 05/05/2024 1528 by Merilee, Vona Whiters R, LPN Outcome: Progressing 05/05/2024 1248 by Merilee, Surah Pelley R, LPN Outcome: Progressing   Problem: Skin Integrity: Goal: Risk for impaired skin integrity will decrease 05/05/2024 1528 by Merilee Izetta SAUNDERS, LPN Outcome: Progressing 05/05/2024 1248 by Merilee, Hanzel Pizzo R, LPN Outcome: Progressing   "

## 2024-05-06 ENCOUNTER — Other Ambulatory Visit: Payer: Self-pay

## 2024-05-06 LAB — RENAL FUNCTION PANEL
Albumin: 3.7 g/dL (ref 3.5–5.0)
Anion gap: 9 (ref 5–15)
BUN: 11 mg/dL (ref 6–20)
CO2: 28 mmol/L (ref 22–32)
Calcium: 9.3 mg/dL (ref 8.9–10.3)
Chloride: 101 mmol/L (ref 98–111)
Creatinine, Ser: 0.61 mg/dL (ref 0.61–1.24)
GFR, Estimated: >60 mL/min
Glucose, Bld: 111 mg/dL — ABNORMAL HIGH (ref 70–99)
Phosphorus: 2.7 mg/dL (ref 2.5–4.6)
Potassium: 3.2 mmol/L — ABNORMAL LOW (ref 3.5–5.1)
Sodium: 138 mmol/L (ref 135–145)

## 2024-05-06 LAB — MAGNESIUM: Magnesium: 1.6 mg/dL — ABNORMAL LOW (ref 1.7–2.4)

## 2024-05-06 MED ORDER — POTASSIUM CHLORIDE CRYS ER 20 MEQ PO TBCR
40.0000 meq | EXTENDED_RELEASE_TABLET | Freq: Once | ORAL | Status: AC
  Administered 2024-05-06: 40 meq via ORAL
  Filled 2024-05-06: qty 2

## 2024-05-06 MED ORDER — MAGNESIUM SULFATE 2 GM/50ML IV SOLN
2.0000 g | Freq: Once | INTRAVENOUS | Status: AC
  Administered 2024-05-06: 2 g via INTRAVENOUS
  Filled 2024-05-06: qty 50

## 2024-05-06 NOTE — Progress Notes (Signed)
 Patient awaiting completion of IV mag for d/c.

## 2024-05-06 NOTE — Progress Notes (Signed)
 Patient received full dose of IV mag. Discharge instructions provided. Verbalized understanding of all instructions, including need for follow up labs. Patient aware he will likely have to obtain labs at an urgent care, as he does not have an active PCP.

## 2024-05-06 NOTE — Progress Notes (Signed)
 Patient without current PCP. Needing follow up labs, etc. Post discharge. Message sent to office manager for [facility] to see if accommodations can be made for a new patient appointment.

## 2024-05-10 ENCOUNTER — Telehealth: Payer: Self-pay

## 2024-05-11 ENCOUNTER — Emergency Department (HOSPITAL_COMMUNITY)

## 2024-05-11 ENCOUNTER — Other Ambulatory Visit: Payer: Self-pay

## 2024-05-11 ENCOUNTER — Emergency Department (HOSPITAL_COMMUNITY)
Admission: EM | Admit: 2024-05-11 | Discharge: 2024-05-11 | Disposition: A | Discharge status: Home / Self Care | Attending: Emergency Medicine | Admitting: Emergency Medicine

## 2024-05-11 ENCOUNTER — Encounter (HOSPITAL_COMMUNITY): Payer: Self-pay

## 2024-05-11 DIAGNOSIS — I10 Essential (primary) hypertension: Secondary | ICD-10-CM | POA: Diagnosis not present

## 2024-05-11 DIAGNOSIS — M545 Low back pain, unspecified: Secondary | ICD-10-CM | POA: Diagnosis present

## 2024-05-11 DIAGNOSIS — Z7951 Long term (current) use of inhaled steroids: Secondary | ICD-10-CM | POA: Diagnosis not present

## 2024-05-11 DIAGNOSIS — S32038A Other fracture of third lumbar vertebra, initial encounter for closed fracture: Secondary | ICD-10-CM | POA: Diagnosis not present

## 2024-05-11 DIAGNOSIS — Z7901 Long term (current) use of anticoagulants: Secondary | ICD-10-CM | POA: Insufficient documentation

## 2024-05-11 DIAGNOSIS — S32028A Other fracture of second lumbar vertebra, initial encounter for closed fracture: Secondary | ICD-10-CM | POA: Diagnosis not present

## 2024-05-11 DIAGNOSIS — Z79899 Other long term (current) drug therapy: Secondary | ICD-10-CM | POA: Insufficient documentation

## 2024-05-11 DIAGNOSIS — I2510 Atherosclerotic heart disease of native coronary artery without angina pectoris: Secondary | ICD-10-CM | POA: Diagnosis not present

## 2024-05-11 DIAGNOSIS — M25512 Pain in left shoulder: Secondary | ICD-10-CM | POA: Diagnosis not present

## 2024-05-11 DIAGNOSIS — Z86711 Personal history of pulmonary embolism: Secondary | ICD-10-CM | POA: Insufficient documentation

## 2024-05-11 DIAGNOSIS — S32009A Unspecified fracture of unspecified lumbar vertebra, initial encounter for closed fracture: Secondary | ICD-10-CM

## 2024-05-11 DIAGNOSIS — S2231XA Fracture of one rib, right side, initial encounter for closed fracture: Secondary | ICD-10-CM | POA: Insufficient documentation

## 2024-05-11 DIAGNOSIS — J449 Chronic obstructive pulmonary disease, unspecified: Secondary | ICD-10-CM | POA: Diagnosis not present

## 2024-05-11 DIAGNOSIS — Y92410 Unspecified street and highway as the place of occurrence of the external cause: Secondary | ICD-10-CM | POA: Insufficient documentation

## 2024-05-11 DIAGNOSIS — Z23 Encounter for immunization: Secondary | ICD-10-CM | POA: Insufficient documentation

## 2024-05-11 LAB — I-STAT CHEM 8, ED
BUN: 11 mg/dL (ref 6–20)
Calcium, Ion: 1.16 mmol/L (ref 1.15–1.40)
Chloride: 97 mmol/L — ABNORMAL LOW (ref 98–111)
Creatinine, Ser: 0.9 mg/dL (ref 0.61–1.24)
Glucose, Bld: 96 mg/dL (ref 70–99)
HCT: 48 % (ref 39.0–52.0)
Hemoglobin: 16.3 g/dL (ref 13.0–17.0)
Potassium: 3.8 mmol/L (ref 3.5–5.1)
Sodium: 138 mmol/L (ref 135–145)
TCO2: 29 mmol/L (ref 22–32)

## 2024-05-11 LAB — URINE DRUG SCREEN
Amphetamines: NEGATIVE
Barbiturates: NEGATIVE
Benzodiazepines: NEGATIVE
Cocaine: NEGATIVE
Fentanyl: POSITIVE — AB
Methadone Scn, Ur: NEGATIVE
Opiates: NEGATIVE
Tetrahydrocannabinol: NEGATIVE

## 2024-05-11 LAB — COMPREHENSIVE METABOLIC PANEL WITH GFR
ALT: 81 U/L — ABNORMAL HIGH (ref 0–44)
AST: 94 U/L — ABNORMAL HIGH (ref 15–41)
Albumin: 4.3 g/dL (ref 3.5–5.0)
Alkaline Phosphatase: 120 U/L (ref 38–126)
Anion gap: 11 (ref 5–15)
BUN: 11 mg/dL (ref 6–20)
CO2: 30 mmol/L (ref 22–32)
Calcium: 9.9 mg/dL (ref 8.9–10.3)
Chloride: 98 mmol/L (ref 98–111)
Creatinine, Ser: 0.78 mg/dL (ref 0.61–1.24)
GFR, Estimated: >60 mL/min
Glucose, Bld: 95 mg/dL (ref 70–99)
Potassium: 4 mmol/L (ref 3.5–5.1)
Sodium: 139 mmol/L (ref 135–145)
Total Bilirubin: 0.9 mg/dL (ref 0.0–1.2)
Total Protein: 7.1 g/dL (ref 6.5–8.1)

## 2024-05-11 LAB — URINALYSIS, ROUTINE W REFLEX MICROSCOPIC
Bilirubin Urine: NEGATIVE
Glucose, UA: NEGATIVE mg/dL
Hgb urine dipstick: NEGATIVE
Ketones, ur: NEGATIVE mg/dL
Leukocytes,Ua: NEGATIVE
Nitrite: NEGATIVE
Protein, ur: NEGATIVE mg/dL
Specific Gravity, Urine: 1.05 — ABNORMAL HIGH (ref 1.005–1.030)
pH: 7 (ref 5.0–8.0)

## 2024-05-11 LAB — CBC
HCT: 45.1 % (ref 39.0–52.0)
Hemoglobin: 15.7 g/dL (ref 13.0–17.0)
MCH: 36.2 pg — ABNORMAL HIGH (ref 26.0–34.0)
MCHC: 34.8 g/dL (ref 30.0–36.0)
MCV: 103.9 fL — ABNORMAL HIGH (ref 80.0–100.0)
Platelets: 252 10*3/uL (ref 150–400)
RBC: 4.34 MIL/uL (ref 4.22–5.81)
RDW: 13.5 % (ref 11.5–15.5)
WBC: 12 10*3/uL — ABNORMAL HIGH (ref 4.0–10.5)
nRBC: 0 % (ref 0.0–0.2)

## 2024-05-11 LAB — I-STAT CG4 LACTIC ACID, ED: Lactic Acid, Venous: 1.5 mmol/L (ref 0.5–1.9)

## 2024-05-11 LAB — PROTIME-INR
INR: 0.9 (ref 0.8–1.2)
Prothrombin Time: 12.7 s (ref 11.4–15.2)

## 2024-05-11 LAB — SAMPLE TO BLOOD BANK

## 2024-05-11 MED ORDER — ONDANSETRON HCL 4 MG/2ML IJ SOLN
4.0000 mg | Freq: Once | INTRAMUSCULAR | Status: AC
  Administered 2024-05-11: 4 mg via INTRAVENOUS
  Filled 2024-05-11: qty 2

## 2024-05-11 MED ORDER — TETANUS-DIPHTH-ACELL PERTUSSIS 5-2-15.5 LF-MCG/0.5 IM SUSP
0.5000 mL | Freq: Once | INTRAMUSCULAR | Status: AC
  Administered 2024-05-11: 0.5 mL via INTRAMUSCULAR
  Filled 2024-05-11: qty 0.5

## 2024-05-11 MED ORDER — OXYCODONE HCL 5 MG PO TABS: 5.0000 mg | ORAL_TABLET | ORAL | 0 refills | Status: AC | PRN

## 2024-05-11 MED ORDER — OXYCODONE HCL 5 MG PO TABS
5.0000 mg | ORAL_TABLET | ORAL | Status: AC
  Administered 2024-05-11: 5 mg via ORAL
  Filled 2024-05-11: qty 1

## 2024-05-11 MED ORDER — CEFAZOLIN SODIUM-DEXTROSE 2-4 GM/100ML-% IV SOLN
2.0000 g | Freq: Once | INTRAVENOUS | Status: DC
  Filled 2024-05-11: qty 100

## 2024-05-11 MED ORDER — LACTATED RINGERS IV BOLUS
1000.0000 mL | Freq: Once | INTRAVENOUS | Status: AC
  Administered 2024-05-11: 1000 mL via INTRAVENOUS

## 2024-05-11 MED ORDER — HYDROMORPHONE HCL 1 MG/ML IJ SOLN: 0.5000 mg | Freq: Once | INTRAMUSCULAR | Status: DC

## 2024-05-11 MED ORDER — LIDOCAINE 5 % EX PTCH
2.0000 | MEDICATED_PATCH | CUTANEOUS | Status: DC
  Administered 2024-05-11: 2 via TRANSDERMAL
  Filled 2024-05-11: qty 2

## 2024-05-11 MED ORDER — IOHEXOL 350 MG/ML SOLN
75.0000 mL | Freq: Once | INTRAVENOUS | Status: AC | PRN
  Administered 2024-05-11: 75 mL via INTRAVENOUS

## 2024-05-11 MED ORDER — LIDOCAINE 5 % EX PTCH: 1.0000 | MEDICATED_PATCH | CUTANEOUS | 0 refills | Status: AC

## 2024-05-11 MED ORDER — FENTANYL CITRATE (PF) 50 MCG/ML IJ SOSY
50.0000 ug | PREFILLED_SYRINGE | Freq: Once | INTRAMUSCULAR | Status: AC
  Administered 2024-05-11: 50 ug via INTRAVENOUS
  Filled 2024-05-11: qty 1

## 2024-05-11 MED ORDER — HYDROMORPHONE HCL 1 MG/ML IJ SOLN
1.0000 mg | Freq: Once | INTRAMUSCULAR | Status: AC
  Administered 2024-05-11: 1 mg via INTRAVENOUS
  Filled 2024-05-11: qty 1
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
## 2024-05-11 NOTE — ED Notes (Addendum)
 Incentive spirometer given with instructions, cleaned wound on R forearm and placed dressing, pt states that he is ready to go home, states that his son is coming back, but he is going to stay with his wife who is also a patient, pt verbalized understanding d/c instructions and follow up, advised to return for any concerns or worsening symptoms.

## 2024-05-11 NOTE — ED Notes (Signed)
 Pt ambulatory without assistance, pt reports pain, but states that he feels well enough to go home, pt states that he doesn't want to be admitted.  Md notified
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
## 2024-05-11 NOTE — ED Notes (Signed)
 Pt transported to CT ?

## 2024-05-11 NOTE — ED Notes (Signed)
 RN called CCMD to put pt on monitoring.

## 2024-05-11 NOTE — ED Notes (Incomplete)
 Trauma Response Nurse Documentation   PHILIPPE GANG is a 59 y.o. male arriving to [facility] ED via {Trauma ED/EMS:26864}  On {meds; anticoagulants:31417}. Trauma was activated as a {Trauma Level:26868} by *** based on the following trauma criteria {Trauma criteria:26865}.  Patient cleared for CT by Dr. FERNAND Pt transported to {TRN Radiology:26861::CT} with trauma response nurse present to monitor. RN remained with the patient throughout their absence from the department for clinical observation.   [facility] ***.  Trauma MD Arrival Time: ***.  History   Past Medical History:  Diagnosis Date   Abnormal EKG    Allergy    Seasonal   Anxiety    Chicken pox    COPD (chronic obstructive pulmonary disease) (HCC)    COVID-19    04/20/21   Depression    GERD (gastroesophageal reflux disease)    Hyperlipidemia    Hypertension    Urine incontinence      Past Surgical History:  Procedure Laterality Date   CHOLECYSTECTOMY  2000   LEFT HEART CATH AND CORONARY ANGIOGRAPHY N/A 01/31/2017   Procedure: LEFT HEART CATH AND CORONARY ANGIOGRAPHY;  Surgeon: Fernand Denyse LABOR, MD;  Location: [facility] INVASIVE CV LAB;  Service: Cardiovascular;  Laterality: N/A;       Initial Focused Assessment (If applicable, or please see trauma documentation):   CT's Completed:   {Trauma CT:26866}   Interventions:   Plan for disposition:  {Trauma Dispo:26867}   Consults completed:  {Trauma Consults:26862} at ***.  Event Summary:  MTP Summary (If applicable):   Bedside handoff with {Trauma handoff:26863::ED RN} ***.    LEBRON ROCKIE ORN  Trauma Response RN  Please call TRN at [phone] for further assistance.

## 2024-05-12 ENCOUNTER — Emergency Department (HOSPITAL_COMMUNITY)
Admission: EM | Admit: 2024-05-12 | Discharge: 2024-05-12 | Disposition: A | Discharge status: Home / Self Care | Payer: Self-pay | Attending: Emergency Medicine | Admitting: Emergency Medicine

## 2024-05-12 ENCOUNTER — Encounter (HOSPITAL_COMMUNITY): Payer: Self-pay | Admitting: Emergency Medicine

## 2024-05-12 ENCOUNTER — Emergency Department (HOSPITAL_COMMUNITY): Payer: Self-pay

## 2024-05-12 DIAGNOSIS — Z7901 Long term (current) use of anticoagulants: Secondary | ICD-10-CM | POA: Insufficient documentation

## 2024-05-12 DIAGNOSIS — Z86718 Personal history of other venous thrombosis and embolism: Secondary | ICD-10-CM | POA: Insufficient documentation

## 2024-05-12 DIAGNOSIS — S40012D Contusion of left shoulder, subsequent encounter: Secondary | ICD-10-CM | POA: Insufficient documentation

## 2024-05-12 DIAGNOSIS — M79632 Pain in left forearm: Secondary | ICD-10-CM | POA: Insufficient documentation

## 2024-05-12 DIAGNOSIS — S32009D Unspecified fracture of unspecified lumbar vertebra, subsequent encounter for fracture with routine healing: Secondary | ICD-10-CM

## 2024-05-12 DIAGNOSIS — I10 Essential (primary) hypertension: Secondary | ICD-10-CM | POA: Insufficient documentation

## 2024-05-12 DIAGNOSIS — S2231XD Fracture of one rib, right side, subsequent encounter for fracture with routine healing: Secondary | ICD-10-CM | POA: Insufficient documentation

## 2024-05-12 DIAGNOSIS — S5002XD Contusion of left elbow, subsequent encounter: Secondary | ICD-10-CM | POA: Insufficient documentation

## 2024-05-12 DIAGNOSIS — S32039D Unspecified fracture of third lumbar vertebra, subsequent encounter for fracture with routine healing: Secondary | ICD-10-CM | POA: Insufficient documentation

## 2024-05-12 DIAGNOSIS — S32029D Unspecified fracture of second lumbar vertebra, subsequent encounter for fracture with routine healing: Secondary | ICD-10-CM | POA: Insufficient documentation

## 2024-05-12 DIAGNOSIS — Z7951 Long term (current) use of inhaled steroids: Secondary | ICD-10-CM | POA: Insufficient documentation

## 2024-05-12 DIAGNOSIS — J449 Chronic obstructive pulmonary disease, unspecified: Secondary | ICD-10-CM | POA: Insufficient documentation

## 2024-05-12 MED ORDER — IBUPROFEN 400 MG PO TABS
400.0000 mg | ORAL_TABLET | Freq: Once | ORAL | Status: AC
  Administered 2024-05-12: 400 mg via ORAL
  Filled 2024-05-12: qty 1

## 2024-05-12 MED ORDER — OXYCODONE-ACETAMINOPHEN 5-325 MG PO TABS
1.0000 | ORAL_TABLET | Freq: Once | ORAL | Status: AC
  Administered 2024-05-12: 1 via ORAL
  Filled 2024-05-12: qty 1
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
## 2024-05-12 NOTE — Discharge Summary (Signed)
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 consistent with Grade I diastolic  dysfunction (impaired relaxation). Right Ventricle: The right ventricular size is normal. No increase in right ventricular wall thickness. Right ventricular systolic function is normal. Left Atrium: Left atrial size was normal in size. Right Atrium: Right atrial size was normal in size. Pericardium: There is no evidence of pericardial effusion. Mitral Valve: The mitral valve is normal in structure. Trivial mitral valve regurgitation. Tricuspid Valve: The tricuspid valve is normal in structure. Tricuspid valve regurgitation is trivial. Aortic Valve: The aortic valve is normal in structure. Aortic valve regurgitation is not visualized. Pulmonic Valve: The pulmonic valve was not well visualized. Pulmonic valve regurgitation is not visualized. Aorta: The aortic root was not well visualized. IAS/Shunts: No atrial level shunt detected by color flow Doppler. Additional Comments: 3D was performed not requiring image post processing on an independent workstation and was indeterminate.  LEFT VENTRICLE PLAX 2D LVIDd:         4.30 cm   Diastology LVIDs:         2.80 cm   LV  e' medial:    7.07 cm/s LV PW:         1.10 cm   LV E/e' medial:  11.1 LV IVS:        1.10 cm   LV e' lateral:   7.83 cm/s LVOT diam:     2.20 cm   LV E/e' lateral: 10.0 LV SV:         91 LV SV Index:   49        2D Longitudinal Strain LVOT Area:     3.80 cm  2D Strain GLS (A4C):   19.6 %                          2D Strain GLS (A3C):   18.6 %                          2D Strain GLS (A2C):   26.5 %                          2D Strain GLS Avg:  21.6 % RIGHT VENTRICLE             IVC RV Basal diam:  3.40 cm     IVC diam: 1.30 cm RV Mid diam:    2.40 cm RV S prime:     11.70 cm/s TAPSE (M-mode): 1.9 cm LEFT ATRIUM             Index        RIGHT ATRIUM           Index LA diam:        3.40 cm 1.81 cm/m   RA Area:     17.50 cm LA Vol (A2C):   45.9 ml 24.50 ml/m  RA Volume:   50.00 ml  26.68 ml/m LA Vol (A4C):   44.7 ml 23.85 ml/m LA Biplane Vol: 46.0 ml 24.55 ml/m  AORTIC VALVE LVOT Vmax:   132.00 cm/s LVOT Vmean:  87.500 cm/s LVOT VTI:    0.240 m  AORTA Ao Root diam: 3.20 cm MITRAL VALVE MV Area (PHT): 3.93 cm     SHUNTS MV Decel Time: 193 msec     Systemic VTI:  0.24 m MV E velocity: 78.60 cm/s   Systemic Diam: 2.20 cm MV A velocity: 121.00 cm/s MV E/A ratio:  0.65 Dwayne D Callwood MD Electronically signed by Cara JONETTA Lovelace MD Signature Date/Time: 05/03/2024/3:49:41 PM    Final    MR BRAIN WO CONTRAST Result Date: 05/03/2024 CLINICAL DATA:  Initial evaluation for acute stroke, right-sided numbness. EXAM: MRI HEAD WITHOUT CONTRAST TECHNIQUE: Multiplanar, multiecho pulse sequences of the brain and surrounding structures were obtained without intravenous contrast. COMPARISON:  Comparison made with CTs from earlier the same day. FINDINGS: Brain: Mild age-related cerebral atrophy. Patchy T2/FLAIR signal intensity involving the periventricular and deep white matter, consistent with chronic small vessel ischemic disease, mild in nature. No evidence for acute or subacute infarct. No areas of chronic cortical  infarction. No acute intracranial hemorrhage. No acute or chronic intracranial blood products. Focus of susceptibility artifact at the central cerebellum related to underlying calcification as seen on prior head CT noted. No mass lesion, midline shift or mass effect. No hydrocephalus or extra-axial fluid collection. Pituitary gland within normal limits. Vascular: Major intracranial vascular flow voids are maintained. Skull and upper cervical spine: Craniocervical junction within normal limits. Bone marrow signal intensity normal. No scalp soft tissue abnormality. Sinuses/Orbits: Globes orbital soft tissues within normal limits. Mild-to-moderate polypoid mucosal thickening about the maxillary sinuses, with relatively mild mucosal thickening about the ethmoidal air cells. Mastoid air cells are clear. Other: None. IMPRESSION: 1. No acute intracranial abnormality. 2. Mild age-related cerebral atrophy with chronic small vessel ischemic disease. Electronically Signed   By: Morene Hoard M.D.   On: 05/03/2024 00:26   CT ANGIO HEAD NECK W WO CM (CODE STROKE) Result Date: 05/02/2024 CLINICAL DATA:  Initial evaluation for acute neuro deficit, stroke suspected. EXAM: CT ANGIOGRAPHY HEAD AND NECK WITH AND WITHOUT CONTRAST TECHNIQUE: Multidetector CT imaging of the head and neck was performed using the standard protocol during bolus administration of intravenous contrast. Multiplanar CT image reconstructions and MIPs were obtained to evaluate the vascular anatomy. Carotid stenosis measurements (when applicable) are obtained utilizing NASCET criteria, using the distal internal carotid diameter as the denominator. RADIATION DOSE REDUCTION: This exam was performed according to the departmental dose-optimization program which includes automated exposure control, adjustment of the mA and/or kV according to patient size and/or use of iterative reconstruction technique. CONTRAST:  100mL OMNIPAQUE  IOHEXOL   350 MG/ML SOLN  COMPARISON:  Comparison made with CT from earlier the same day. FINDINGS: CTA NECK FINDINGS Aortic arch: Aortic arch within normal limits for caliber standard 3 vessel morphology. Mild aortic atherosclerosis. No stenosis about the origin the great vessels. Right carotid system: No evidence of dissection, stenosis (50% or greater), or occlusion. Left carotid system: No evidence of dissection, stenosis (50% or greater), or occlusion. Vertebral arteries: Codominant. No evidence of dissection, stenosis (50% or greater), or occlusion. Skeleton: No worrisome osseous lesions. Mild for age cervical spondylosis. Poor dentition with multiple dental caries noted. Other neck: 1.1 cm sebaceous cyst noted at the left face (series 6, image 215). 1.2 cm right thyroid  nodule, of doubtful significance given size and patient age, no follow-up imaging recommended (ref: J Am Coll Radiol. 2015 Feb;12(2): 143-50).No other acute finding. Upper chest: Better evaluated on concomitant CT of the chest. Review of the MIP images confirms the above findings CTA HEAD FINDINGS Anterior circulation: Both internal carotid arteries widely patent to the termini without stenosis. A1 segments widely patent. Normal anterior communicating artery complex. Both anterior cerebral arteries widely patent to their distal aspects without stenosis. No M1 stenosis or occlusion. Normal MCA bifurcations. Distal MCA branches well perfused and symmetric. Posterior circulation: Both V4 segments patent to the vertebrobasilar junction without stenosis. Both PICA origins patent and normal. Basilar widely patent to its distal aspect without stenosis. Superior cerebellar arteries patent bilaterally. Both PCAs primarily supplied via the basilar and are well perfused to there distal aspects. Venous sinuses: Grossly patent allowing for timing the contrast bolus. Anatomic variants: None significant.  No aneurysm. Review of the MIP images confirms the above findings IMPRESSION:  1. Negative CTA of the head and neck. No large vessel occlusion or other emergent finding. No hemodynamically significant or correctable stenosis. 2. Poor dentition with multiple dental caries. Aortic Atherosclerosis (ICD10-I70.0). These results were communicated to Dr. Jerri at 8:00 pm on 05/02/2024 by text page via the [facility] messaging system. Electronically Signed   By: Morene Hoard M.D.   On: 05/02/2024 20:06   CT Angio Chest PE W and/or Wo Contrast Result Date: 05/02/2024 CLINICAL DATA:  Chest pain EXAM: CT ANGIOGRAPHY CHEST WITH CONTRAST TECHNIQUE: Multidetector CT imaging of the chest was performed using the standard protocol during bolus administration of intravenous contrast. Multiplanar CT image reconstructions and MIPs were obtained to evaluate the vascular anatomy. RADIATION DOSE REDUCTION: This exam was performed according to the departmental dose-optimization program which includes automated exposure control, adjustment of the mA and/or kV according to patient size and/or use of iterative reconstruction technique. CONTRAST:  OMNIPAQUE  IOHEXOL  350 MG/ML SOLN COMPARISON:  Chest x-ray 05/02/2024, chest CT 12/17/2023 FINDINGS: Cardiovascular: Satisfactory opacification of the pulmonary arteries to the segmental level. No evidence of pulmonary embolism. Normal heart size. No pericardial effusion. Mild atherosclerosis. No aneurysm. Mild coronary vascular calcification. Normal cardiac size. No pericardial effusion Mediastinum/Nodes: Patent trachea. No thyroid  mass. No suspicious lymph nodes. Esophagus within normal limits. Lungs/Pleura: No acute airspace disease, pleural effusion or pneumothorax. Upper Abdomen: No acute finding. Hepatic steatosis. Calcifications incompletely visualized at the pancreatic head suggesting chronic pancreatitis. Musculoskeletal: No acute or suspicious osseous abnormality. Review of the MIP images confirms the above findings. IMPRESSION: 1. Negative for acute pulmonary  embolus or aortic dissection. Clear lung fields. 2. Hepatic steatosis. 3. Aortic atherosclerosis. Aortic Atherosclerosis (ICD10-I70.0). Electronically Signed   By: Luke Bun M.D.   On: 05/02/2024 19:57   CT HEAD CODE STROKE WO CONTRAST (LKW 0-4.5h,  LVO 0-24h) Result Date: 05/02/2024 CLINICAL DATA:  Code stroke. EXAM: CT HEAD WITHOUT CONTRAST TECHNIQUE: Contiguous axial images were obtained from the base of the skull through the vertex without intravenous contrast. RADIATION DOSE REDUCTION: This exam was performed according to the departmental dose-optimization program which includes automated exposure control, adjustment of the mA and/or kV according to patient size and/or use of iterative reconstruction technique. COMPARISON:  Prior study from 06/30/2022. FINDINGS: Brain: Cerebral volume within normal limits. No acute intracranial hemorrhage. No acute large vessel territory infarct. No mass lesion or midline shift. No hydrocephalus or extra-axial fluid collection. Vascular: No abnormal hyperdense vessel. Skull: Scalp soft tissues within normal limits.  Calvarium intact. Sinuses/Orbits: Globes orbital soft tissues within normal limits. Mild-to-moderate mucosal thickening present about the maxillary sinuses, with mild mucosal thickening about the ethmoidal air cells. Mastoid air cells are clear. Other: None. ASPECTS [facility] Stroke Program Early CT Score) - Ganglionic level infarction (caudate, lentiform nuclei, internal capsule, insula, M1-M3 cortex): 7 - Supraganglionic infarction (M4-M6 cortex): 3 Total score (0-10 with 10 being normal): 10 IMPRESSION: 1. Negative head CT.  No acute intracranial abnormality. 2. ASPECTS is 10. These results were communicated to Dr. Jerri at 7:23 pm on 05/02/2024 by text page via the [facility] messaging system. Electronically Signed   By: Morene Hoard M.D.   On: 05/02/2024 19:24   DG Chest 2 View Result Date: 05/02/2024 CLINICAL DATA:  Chest pain EXAM: CHEST - 2 VIEW  COMPARISON:  12/17/2023 FINDINGS: The heart size and mediastinal contours are within normal limits. Both lungs are clear. The visualized skeletal structures are unremarkable. IMPRESSION: No active cardiopulmonary disease. Electronically Signed   By: Luke Bun M.D.   On: 05/02/2024 18:01    Microbiology: Results for orders placed or performed during the hospital encounter of 05/02/24  Respiratory (~20 pathogens) panel by PCR     Status: None   Collection Time: 05/03/24  4:32 PM   Specimen: Nasopharyngeal Swab; Respiratory  Result Value Ref Range Status   Adenovirus NOT DETECTED NOT DETECTED Final   Coronavirus 229E NOT DETECTED NOT DETECTED Final    Comment: (NOTE) The Coronavirus on the Respiratory Panel, DOES NOT test for the novel  Coronavirus (2019 nCoV)    Coronavirus HKU1 NOT DETECTED NOT DETECTED Final   Coronavirus NL63 NOT DETECTED NOT DETECTED Final   Coronavirus OC43 NOT DETECTED NOT DETECTED Final   Metapneumovirus NOT DETECTED NOT DETECTED Final   Rhinovirus / Enterovirus NOT DETECTED NOT DETECTED Final   Influenza A NOT DETECTED NOT DETECTED Final   Influenza B NOT DETECTED NOT DETECTED Final   Parainfluenza Virus 1 NOT DETECTED NOT DETECTED Final   Parainfluenza Virus 2 NOT DETECTED NOT DETECTED Final   Parainfluenza Virus 3 NOT DETECTED NOT DETECTED Final   Parainfluenza Virus 4 NOT DETECTED NOT DETECTED Final   Respiratory Syncytial Virus NOT DETECTED NOT DETECTED Final   Bordetella pertussis NOT DETECTED NOT DETECTED Final   Bordetella Parapertussis NOT DETECTED NOT DETECTED Final   Chlamydophila pneumoniae NOT DETECTED NOT DETECTED Final   Mycoplasma pneumoniae NOT DETECTED NOT DETECTED Final    Comment: Performed at [facility], [address], [place], [place] [address]    Labs: CBC: Recent Labs  Lab 05/11/24 1159 05/11/24 1213  WBC 12.0*  --   HGB 15.7 16.3  HCT 45.1 48.0  MCV 103.9*  --   PLT 252  --    Basic Metabolic Panel: Recent Labs   Lab 05/05/24 1025 05/06/24  9063 05/11/24 1159 05/11/24 1213  NA  --  138 139 138  K 2.9* 3.2* 4.0 3.8  CL  --  101 98 97*  CO2  --  28 30  --   GLUCOSE  --  111* 95 96  BUN  --  11 11 11   CREATININE  --  0.61 0.78 0.90  CALCIUM   --  9.3 9.9  --   MG  --  1.6*  --   --   PHOS 1.9* 2.7  --   --    Liver Function Tests: Recent Labs  Lab 05/06/24 0936 05/11/24 1159  AST  --  94*  ALT  --  81*  ALKPHOS  --  120  BILITOT  --  0.9  PROT  --  7.1  ALBUMIN 3.7 4.3   CBG: No results for input(s): GLUCAP in the last 168 hours.  Discharge time spent: Time Coordinating Discharge: I spent a total of 35 minutes engaged in face-to-face discussion with the patient and/or caregivers regarding the patients care, assessment, plan, and discharge disposition. Over 50% of this time was dedicated to counseling the patient on the risks and benefits of treatment options and the discharge plan, as well as coordinating post-discharge care.   Signed: Breuna Loveall Al-Sultani, MD [facility] 05/12/2024         "
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
## 2024-05-12 NOTE — Discharge Instructions (Addendum)
 Apply ice to areas that are painful.  Ice should be applied for 30 minutes at a time, 4 times a day.  You may take acetaminophen  and oxycodone  for pain.  Unfortunately, because you are taking the blood thinner, you should not take ibuprofen  which can also help with pain.  Start with acetaminophen  and add oxycodone  if you need additional pain relief.  Wear the sling as needed.  You have broken ribs and broken bones in your back.  This will take 6-8 weeks to heal, possibly even longer.  Please follow-up with the neurosurgeon to monitor the healing of the broken bones in your back.

## 2024-05-15 ENCOUNTER — Encounter: Payer: Self-pay | Admitting: Family Medicine
